# Patient Record
Sex: Male | Born: 1950
Health system: Southern US, Community
[De-identification: ages and names within clinical notes are randomized; demographics above are authoritative.]

## PROBLEM LIST (undated history)

## (undated) DIAGNOSIS — E785 Hyperlipidemia, unspecified: Secondary | ICD-10-CM

## (undated) DIAGNOSIS — K589 Irritable bowel syndrome without diarrhea: Secondary | ICD-10-CM

## (undated) DIAGNOSIS — I10 Essential (primary) hypertension: Secondary | ICD-10-CM

## (undated) DIAGNOSIS — N400 Enlarged prostate without lower urinary tract symptoms: Secondary | ICD-10-CM

## (undated) DIAGNOSIS — E291 Testicular hypofunction: Secondary | ICD-10-CM

## (undated) DIAGNOSIS — K5903 Drug induced constipation: Secondary | ICD-10-CM

## (undated) DIAGNOSIS — M1990 Unspecified osteoarthritis, unspecified site: Secondary | ICD-10-CM

## (undated) DIAGNOSIS — B20 Human immunodeficiency virus [HIV] disease: Secondary | ICD-10-CM

## (undated) DIAGNOSIS — C801 Malignant (primary) neoplasm, unspecified: Secondary | ICD-10-CM

## (undated) HISTORY — DX: Hyperlipidemia, unspecified: E78.5

## (undated) HISTORY — DX: Human immunodeficiency virus (HIV) disease: B20

## (undated) HISTORY — PX: BACK SURGERY: SHX140

## (undated) HISTORY — PX: COLONOSCOPY W/ POLYPECTOMY: SHX1380

## (undated) HISTORY — PX: EYE SURGERY: SHX253

## (undated) HISTORY — DX: Testicular hypofunction: E29.1

---

## 1971-07-18 HISTORY — PX: HEMORRHOID SURGERY: SHX153

## 1982-07-17 HISTORY — PX: OTHER SURGICAL HISTORY: SHX169

## 1983-07-18 DIAGNOSIS — B20 Human immunodeficiency virus [HIV] disease: Secondary | ICD-10-CM

## 1983-07-18 HISTORY — DX: Human immunodeficiency virus (HIV) disease: B20

## 2001-08-12 ENCOUNTER — Ambulatory Visit (HOSPITAL_COMMUNITY): Admission: RE | Admit: 2001-08-12 | Discharge: 2001-08-12 | Payer: Self-pay | Admitting: Internal Medicine

## 2002-06-01 ENCOUNTER — Encounter: Admission: RE | Admit: 2002-06-01 | Discharge: 2002-06-01 | Payer: Self-pay | Admitting: Internal Medicine

## 2002-06-01 ENCOUNTER — Encounter: Payer: Self-pay | Admitting: Internal Medicine

## 2003-08-06 ENCOUNTER — Encounter: Admission: RE | Admit: 2003-08-06 | Discharge: 2003-08-06 | Payer: Self-pay | Admitting: Internal Medicine

## 2006-05-21 ENCOUNTER — Ambulatory Visit: Payer: Self-pay | Admitting: Internal Medicine

## 2006-05-21 LAB — CONVERTED CEMR LAB
AST: 33 units/L (ref 0–37)
Albumin: 4.2 g/dL (ref 3.5–5.2)
BUN: 16 mg/dL (ref 6–23)
Basophils Absolute: 0 10*3/uL (ref 0.0–0.1)
Basophils Relative: 1.1 % — ABNORMAL HIGH (ref 0.0–1.0)
Chloride: 99 meq/L (ref 96–112)
Chol/HDL Ratio, serum: 2.5
Cholesterol: 221 mg/dL (ref 0–200)
Eosinophil percent: 1.3 % (ref 0.0–5.0)
GFR calc non Af Amer: 67 mL/min
Glomerular Filtration Rate, Af Am: 81 mL/min/{1.73_m2}
Glucose, Bld: 96 mg/dL (ref 70–99)
Hemoglobin: 15.9 g/dL (ref 13.0–17.0)
LDL DIRECT: 113.3 mg/dL
Leukocytes, UA: NEGATIVE
MCV: 99.2 fL (ref 78.0–100.0)
Monocytes Absolute: 0.3 10*3/uL (ref 0.2–0.7)
Monocytes Relative: 8.1 % (ref 3.0–11.0)
Neutro Abs: 2 10*3/uL (ref 1.4–7.7)
Neutrophils Relative %: 59.9 % (ref 43.0–77.0)
Nitrite: NEGATIVE
PSA: 0.61 ng/mL (ref 0.10–4.00)
Platelets: 154 10*3/uL (ref 150–400)
RBC: 4.83 M/uL (ref 4.22–5.81)
RDW: 11.6 % (ref 11.5–14.6)
TSH: 2.45 microintl units/mL (ref 0.35–5.50)
Triglyceride fasting, serum: 46 mg/dL (ref 0–149)
VLDL: 9 mg/dL (ref 0–40)
pH: 5.5 (ref 5.0–8.0)

## 2006-05-24 ENCOUNTER — Ambulatory Visit: Payer: Self-pay | Admitting: Internal Medicine

## 2006-08-09 ENCOUNTER — Encounter: Admission: RE | Admit: 2006-08-09 | Discharge: 2006-08-09 | Payer: Self-pay | Admitting: Orthopedic Surgery

## 2006-10-01 ENCOUNTER — Ambulatory Visit: Payer: Self-pay | Admitting: Internal Medicine

## 2006-11-28 ENCOUNTER — Encounter: Admission: RE | Admit: 2006-11-28 | Discharge: 2006-11-28 | Payer: Self-pay | Admitting: Orthopedic Surgery

## 2006-12-21 ENCOUNTER — Encounter: Admission: RE | Admit: 2006-12-21 | Discharge: 2006-12-21 | Payer: Self-pay | Admitting: Orthopedic Surgery

## 2007-01-28 ENCOUNTER — Encounter: Admission: RE | Admit: 2007-01-28 | Discharge: 2007-01-28 | Payer: Self-pay | Admitting: Orthopedic Surgery

## 2007-04-09 ENCOUNTER — Ambulatory Visit: Payer: Self-pay | Admitting: Internal Medicine

## 2007-05-31 ENCOUNTER — Encounter: Admission: RE | Admit: 2007-05-31 | Discharge: 2007-05-31 | Payer: Self-pay | Admitting: Orthopedic Surgery

## 2007-06-06 ENCOUNTER — Encounter: Payer: Self-pay | Admitting: Internal Medicine

## 2007-06-14 ENCOUNTER — Encounter: Admission: RE | Admit: 2007-06-14 | Discharge: 2007-06-14 | Payer: Self-pay | Admitting: Orthopedic Surgery

## 2007-06-20 ENCOUNTER — Encounter: Payer: Self-pay | Admitting: Internal Medicine

## 2007-06-20 DIAGNOSIS — M542 Cervicalgia: Secondary | ICD-10-CM | POA: Insufficient documentation

## 2007-06-20 DIAGNOSIS — B20 Human immunodeficiency virus [HIV] disease: Secondary | ICD-10-CM | POA: Insufficient documentation

## 2007-06-28 ENCOUNTER — Ambulatory Visit: Payer: Self-pay | Admitting: Internal Medicine

## 2007-07-05 ENCOUNTER — Encounter: Admission: RE | Admit: 2007-07-05 | Discharge: 2007-07-05 | Payer: Self-pay | Admitting: Orthopedic Surgery

## 2007-07-18 HISTORY — PX: KNEE ARTHROSCOPY: SUR90

## 2007-08-14 ENCOUNTER — Telehealth: Payer: Self-pay | Admitting: Internal Medicine

## 2007-11-01 ENCOUNTER — Encounter: Admission: RE | Admit: 2007-11-01 | Discharge: 2007-11-01 | Payer: Self-pay | Admitting: Orthopedic Surgery

## 2007-11-12 ENCOUNTER — Encounter: Payer: Self-pay | Admitting: Internal Medicine

## 2007-12-27 ENCOUNTER — Encounter: Admission: RE | Admit: 2007-12-27 | Discharge: 2007-12-27 | Payer: Self-pay | Admitting: Orthopedic Surgery

## 2008-01-10 ENCOUNTER — Telehealth (INDEPENDENT_AMBULATORY_CARE_PROVIDER_SITE_OTHER): Payer: Self-pay | Admitting: *Deleted

## 2008-01-10 ENCOUNTER — Encounter: Admission: RE | Admit: 2008-01-10 | Discharge: 2008-01-10 | Payer: Self-pay | Admitting: Orthopedic Surgery

## 2008-01-29 ENCOUNTER — Encounter: Admission: RE | Admit: 2008-01-29 | Discharge: 2008-01-29 | Payer: Self-pay | Admitting: Orthopedic Surgery

## 2008-02-06 ENCOUNTER — Telehealth: Payer: Self-pay | Admitting: Internal Medicine

## 2008-02-25 ENCOUNTER — Encounter: Payer: Self-pay | Admitting: Internal Medicine

## 2008-03-17 ENCOUNTER — Telehealth: Payer: Self-pay | Admitting: Internal Medicine

## 2008-03-24 ENCOUNTER — Telehealth: Payer: Self-pay | Admitting: Internal Medicine

## 2008-03-25 ENCOUNTER — Encounter: Payer: Self-pay | Admitting: Internal Medicine

## 2008-04-20 ENCOUNTER — Encounter: Payer: Self-pay | Admitting: Internal Medicine

## 2008-05-27 ENCOUNTER — Ambulatory Visit: Payer: Self-pay | Admitting: Internal Medicine

## 2008-06-18 ENCOUNTER — Encounter: Admission: RE | Admit: 2008-06-18 | Discharge: 2008-06-18 | Payer: Self-pay | Admitting: Orthopedic Surgery

## 2008-09-11 ENCOUNTER — Encounter: Admission: RE | Admit: 2008-09-11 | Discharge: 2008-09-11 | Payer: Self-pay | Admitting: Orthopedic Surgery

## 2008-09-22 ENCOUNTER — Encounter: Payer: Self-pay | Admitting: Internal Medicine

## 2008-10-01 ENCOUNTER — Encounter: Admission: RE | Admit: 2008-10-01 | Discharge: 2008-10-01 | Payer: Self-pay | Admitting: Orthopedic Surgery

## 2008-10-21 ENCOUNTER — Encounter: Payer: Self-pay | Admitting: Internal Medicine

## 2008-11-02 ENCOUNTER — Encounter: Payer: Self-pay | Admitting: Internal Medicine

## 2008-11-26 ENCOUNTER — Encounter: Payer: Self-pay | Admitting: Internal Medicine

## 2008-11-30 ENCOUNTER — Encounter: Payer: Self-pay | Admitting: Internal Medicine

## 2008-12-18 ENCOUNTER — Encounter: Payer: Self-pay | Admitting: Internal Medicine

## 2008-12-21 ENCOUNTER — Encounter: Payer: Self-pay | Admitting: Internal Medicine

## 2008-12-22 ENCOUNTER — Telehealth: Payer: Self-pay | Admitting: Internal Medicine

## 2008-12-22 ENCOUNTER — Encounter: Admission: RE | Admit: 2008-12-22 | Discharge: 2008-12-22 | Payer: Self-pay | Admitting: Orthopedic Surgery

## 2008-12-24 ENCOUNTER — Encounter: Payer: Self-pay | Admitting: Internal Medicine

## 2009-01-01 ENCOUNTER — Telehealth: Payer: Self-pay | Admitting: Internal Medicine

## 2009-02-10 ENCOUNTER — Ambulatory Visit: Payer: Self-pay | Admitting: Internal Medicine

## 2009-02-17 ENCOUNTER — Telehealth: Payer: Self-pay | Admitting: Internal Medicine

## 2009-02-19 ENCOUNTER — Telehealth: Payer: Self-pay | Admitting: Internal Medicine

## 2009-03-02 ENCOUNTER — Telehealth: Payer: Self-pay | Admitting: Internal Medicine

## 2009-03-19 ENCOUNTER — Encounter: Admission: RE | Admit: 2009-03-19 | Discharge: 2009-03-19 | Payer: Self-pay | Admitting: Orthopedic Surgery

## 2009-03-23 ENCOUNTER — Telehealth: Payer: Self-pay | Admitting: Internal Medicine

## 2009-04-08 ENCOUNTER — Encounter: Payer: Self-pay | Admitting: Internal Medicine

## 2009-04-09 ENCOUNTER — Telehealth: Payer: Self-pay | Admitting: Internal Medicine

## 2009-04-13 ENCOUNTER — Telehealth: Payer: Self-pay | Admitting: Internal Medicine

## 2009-04-20 ENCOUNTER — Telehealth: Payer: Self-pay | Admitting: Internal Medicine

## 2009-04-20 ENCOUNTER — Encounter: Payer: Self-pay | Admitting: Internal Medicine

## 2009-04-30 ENCOUNTER — Encounter: Admission: RE | Admit: 2009-04-30 | Discharge: 2009-04-30 | Payer: Self-pay | Admitting: Orthopedic Surgery

## 2009-06-25 ENCOUNTER — Encounter: Admission: RE | Admit: 2009-06-25 | Discharge: 2009-06-25 | Payer: Self-pay | Admitting: Orthopedic Surgery

## 2009-07-17 HISTORY — PX: KNEE ARTHROSCOPY: SUR90

## 2009-08-06 ENCOUNTER — Encounter: Admission: RE | Admit: 2009-08-06 | Discharge: 2009-08-06 | Payer: Self-pay | Admitting: Orthopedic Surgery

## 2009-08-30 ENCOUNTER — Telehealth: Payer: Self-pay | Admitting: Internal Medicine

## 2009-09-09 ENCOUNTER — Encounter: Admission: RE | Admit: 2009-09-09 | Discharge: 2009-09-09 | Payer: Self-pay | Admitting: Orthopedic Surgery

## 2009-09-20 ENCOUNTER — Encounter: Payer: Self-pay | Admitting: Internal Medicine

## 2009-09-29 ENCOUNTER — Ambulatory Visit: Payer: Self-pay | Admitting: Internal Medicine

## 2009-12-03 ENCOUNTER — Telehealth: Payer: Self-pay | Admitting: Internal Medicine

## 2009-12-14 ENCOUNTER — Encounter: Admission: RE | Admit: 2009-12-14 | Discharge: 2009-12-14 | Payer: Self-pay | Admitting: Orthopedic Surgery

## 2010-02-04 ENCOUNTER — Ambulatory Visit: Payer: Self-pay | Admitting: Internal Medicine

## 2010-02-07 ENCOUNTER — Encounter: Admission: RE | Admit: 2010-02-07 | Discharge: 2010-02-07 | Payer: Self-pay | Admitting: Orthopedic Surgery

## 2010-02-11 ENCOUNTER — Telehealth: Payer: Self-pay | Admitting: Internal Medicine

## 2010-03-29 ENCOUNTER — Encounter: Admission: RE | Admit: 2010-03-29 | Discharge: 2010-03-29 | Payer: Self-pay | Admitting: Orthopedic Surgery

## 2010-03-29 ENCOUNTER — Telehealth: Payer: Self-pay | Admitting: Internal Medicine

## 2010-07-07 ENCOUNTER — Encounter
Admission: RE | Admit: 2010-07-07 | Discharge: 2010-07-07 | Payer: Self-pay | Source: Home / Self Care | Attending: Orthopedic Surgery | Admitting: Orthopedic Surgery

## 2010-07-08 ENCOUNTER — Telehealth: Payer: Self-pay | Admitting: Internal Medicine

## 2010-07-25 ENCOUNTER — Telehealth: Payer: Self-pay | Admitting: Internal Medicine

## 2010-07-28 ENCOUNTER — Telehealth: Payer: Self-pay | Admitting: Internal Medicine

## 2010-07-29 ENCOUNTER — Encounter: Payer: Self-pay | Admitting: Internal Medicine

## 2010-08-10 ENCOUNTER — Telehealth: Payer: Self-pay | Admitting: Internal Medicine

## 2010-08-16 ENCOUNTER — Encounter
Admission: RE | Admit: 2010-08-16 | Discharge: 2010-08-16 | Payer: Self-pay | Source: Home / Self Care | Attending: Orthopedic Surgery | Admitting: Orthopedic Surgery

## 2010-08-16 NOTE — Progress Notes (Signed)
Summary: REFILL  Phone Note Refill Request   Refills Requested: Medication #1:  TESTOSTERONE ENANTHATE 200 MG/ML  OIL INJECT 200MG  ( ) EVERY 2 WEEKS Pt did not give name of pharm, I believe he needs this mail order.   Initial call taken by: Lamar Sprinkles, CMA,  Dec 03, 2009 4:51 PM  Follow-up for Phone Call        ok for refill as needed  Follow-up by: Jacques Navy MD,  Dec 03, 2009 6:52 PM    Prescriptions: TESTOSTERONE ENANTHATE 200 MG/ML  OIL (TESTOSTERONE ENANTHATE) INJECT 200MG  ( ) EVERY 2 WEEKS  #3 mth x 1   Entered by:   Lamar Sprinkles, CMA   Authorized by:   Jacques Navy MD   Signed by:   Lamar Sprinkles, CMA on 12/06/2009   Method used:   Printed then faxed to ...       [facility]       [address]       [place], [place]  [address]       Ph: [phone]       Fax: [phone]   RxID:   [id number]   Appended Document: REFILL Left vm for pt that rx was faxed in last week  Appended Document: REFILL Pt called, Pharm did not recieved rx, [facility] to pharmacist. Left vm for pt

## 2010-08-16 NOTE — Assessment & Plan Note (Signed)
Summary: INDIGESTION--X 3 WKS/STARTED--3/2---STC   Vital Signs:  Patient profile:   60 year old male Height:      68 inches Weight:      147 pounds BMI:     22.43 O2 Sat:      97 % on Room air Temp:     96.6 degrees F oral Pulse rate:   65 / minute BP sitting:   138 / 98  (left arm) Cuff size:   regular  Vitals Entered By: Bill Salinas CMA (September 29, 2009 11:27 AM)  O2 Flow:  Room air CC: pt here with complaint of severe indigestion x 3 weeks/ ab   Primary Care Provider:  Kathyann Spaugh  CC:  pt here with complaint of severe indigestion x 3 weeks/ ab.  History of Present Illness: Presents for a 3 weeks h/o left upper abdominal pain, moderate to severe. He has tried gaviscon which helped. No hematochezia or melena. He has been feeling weak. He has cut out all supplements.   Current Medications (verified): 1)  Testosterone Enanthate 200 Mg/ml  Oil (Testosterone Enanthate) .... Inject 200mg  (1ml) Every 2 Weeks 2)  Potassium 99 Mg Tabs (Potassium) .... Take 1 Tablet By Mouth Once A Day 3)  Garlic 500 Mg Tabs (Garlic) .... Take 1 Tablet By Mouth Once A Day 4)  Adult Aspirin Low Strength 81 Mg  Tbdp (Aspirin) .... Take 1 Tablet By Mouth Once A Day 5)  Multivitamins   Tabs (Multiple Vitamin) .... Take 1 Tablet By Mouth Once A Day 6)  Saw Palmetto 450 Mg  Caps (Saw Palmetto (Serenoa Repens)) .... Take 1 Tablet By Mouth Once A Day 7)  Caverject 20 Mcg Solr (Alprostadil (Vasodilator)) .... Inject Subcutaneously Once Daily As Needed 8)  Bd Luer-Lok Syringe 23g X 1-1/2" 3 Ml Misc (Syringe/needle (Disp)) .... Use 1 Q 2 Wks As Directed With Testosterone 9)  Etodolac 500 Mg Tabs (Etodolac) .... Take 1 Tablet By Mouth Once A Day 10)  Naproxen 500 Mg Tabs (Naproxen) .... Take 1 Tablet By Mouth Once A Day 11)  Hydrocodone-Acetaminophen 7.5-750 Mg Tabs (Hydrocodone-Acetaminophen) .... Take 2-3 Tabs Daily 12)  Glucosamine-Chondroitin-Msm 500-400-250 Mg Tabs (Glucosamine-Chondroitin-Msm) .... Take 1 Tablet  By Mouth Once A Day 13)  Fish Oil 1000 Mg Caps (Omega-3 Fatty Acids) .... Take 1 Tablet By Mouth Once A Day 14)  Horny Goat Weed  Caps (Misc [employer]) .... Take 1 Tablet By Mouth Once A Day 15)  Zinc 50 Mg Tabs (Zinc) .... Take 1 Tablet By Mouth Once A Day 16)  Dhea 25 Mg Caps (Prasterone (Dhea)) .... Take 1 Tablet By Mouth Once A Day 17)  Melatonin 5 Mg Tabs (Melatonin) .... As Needed 18)  Amitriptyline Hcl 25 Mg Tabs (Amitriptyline Hcl) .... As Directed As Needed 19)  Cyclobenzaprine Hcl 5 Mg Tabs (Cyclobenzaprine Hcl) .... As Directed As Needed 20)  Atripla 600-200-300 Mg Tabs (Efavirenz-Emtricitab-Tenofovir) .[name].. 1 By Mouth Once Daily 21)  Bacterio-Static Water .... Use With Caverject 22)  Cialis 5 Mg Tabs (Tadalafil) .[name].. 1 By Mouth Once Daily 23)  Hydrochlorothiazide 50 Mg Tabs (Hydrochlorothiazide) .[name].. 1 Tab Qd  Allergies (verified): No Known Drug Allergies  Past History:  Past Medical History: Last updated: 05/27/2008 HIV disease - last CD4 in July 2009 was 224  Past Surgical History: Last updated: 06/20/2007 neck surgery Hemorrhoidectomy  Review of Systems       The patient complains of severe indigestion/heartburn.  The patient denies anorexia, fever, weight loss, decreased hearing,  chest pain, syncope, dyspnea on exertion, prolonged cough, headaches, hemoptysis, abdominal pain, melena, hematochezia, muscle weakness, transient blindness, abnormal bleeding, and angioedema.    Physical Exam  General:  Well-developed,well-nourished,in no acute distress; alert,appropriate and cooperative throughout examination Lungs:  normal respiratory effort.   Heart:  normal rate and regular rhythm.   Abdomen:  tender to palpation epigastrum and LUQ. No guasrding or rebound   Impression & Recommendations:  Problem # 1:  DYSPEPSIA (ICD-536.8) Dyspepsia without evidence of bleed.  Plan - nexium 40mg  once daily x 15 (samples) then           Rantidine 150mg  two times a day x  4 weeks           Call for unrelieved pain or bleeding.  Complete Medication List: 1)  Testosterone Enanthate 200 Mg/ml Oil (Testosterone enanthate) .... Inject 200mg  (1ml) every 2 weeks 2)  Potassium 99 Mg Tabs (Potassium) .... Take 1 tablet by mouth once a day 3)  Garlic 500 Mg Tabs (Garlic) .... Take 1 tablet by mouth once a day 4)  Adult Aspirin Low Strength 81 Mg Tbdp (Aspirin) .... Take 1 tablet by mouth once a day 5)  Multivitamins Tabs (Multiple vitamin) .... Take 1 tablet by mouth once a day 6)  Saw Palmetto 450 Mg Caps (Saw palmetto (serenoa repens)) .... Take 1 tablet by mouth once a day 7)  Caverject 20 Mcg Solr (Alprostadil (vasodilator)) .... Inject subcutaneously once daily as needed 8)  Bd Luer-lok Syringe 23g X 1-1/2" 3 Ml Misc (Syringe/needle (disp)) .... Use 1 q 2 wks as directed with testosterone 9)  Etodolac 500 Mg Tabs (Etodolac) .... Take 1 tablet by mouth once a day 10)  Naproxen 500 Mg Tabs (Naproxen) .... Take 1 tablet by mouth once a day 11)  Hydrocodone-acetaminophen 7.5-750 Mg Tabs (Hydrocodone-acetaminophen) .... Take 2-3 tabs daily 12)  Glucosamine-chondroitin-msm 500-400-250 Mg Tabs (Glucosamine-chondroitin-msm) .... Take 1 tablet by mouth once a day 13)  Fish Oil 1000 Mg Caps (Omega-3 fatty acids) .... Take 1 tablet by mouth once a day 14)  Horny Goat Weed Caps (Misc natural products) .... Take 1 tablet by mouth once a day 15)  Zinc 50 Mg Tabs (Zinc) .... Take 1 tablet by mouth once a day 16)  Dhea 25 Mg Caps (Prasterone (dhea)) .... Take 1 tablet by mouth once a day 17)  Melatonin 5 Mg Tabs (Melatonin) .... As needed 18)  Amitriptyline Hcl 25 Mg Tabs (Amitriptyline hcl) .... As directed as needed 19)  Cyclobenzaprine Hcl 5 Mg Tabs (Cyclobenzaprine hcl) .... As directed as needed 20)  Atripla 600-200-300 Mg Tabs (Efavirenz-emtricitab-tenofovir) .[name].. 1 by mouth once daily 21)  Bacterio-static Water  .... Use with caverject 22)  Cialis 5 Mg Tabs (Tadalafil)  .[name].. 1 by mouth once daily 23)  Hydrochlorothiazide 50 Mg Tabs (Hydrochlorothiazide) .[name].. 1 tab qd  Patient Instructions: 1)  Stomach pain - most likely gastritis (increased acid problem). Plan - take Nexium 40mg  once a day for 15 days (samples) then switch to over the counter Ranitidine 150mg  two times a day for 1 month. OK to take gaviscon.

## 2010-08-16 NOTE — Progress Notes (Signed)
Summary: Cialis  Phone Note Call from Patient   Summary of Call: Patient is requesting refill of cialis 5 mg #30. OK?  Initial call taken by: Lamar Sprinkles, CMA,  August 30, 2009 5:09 PM  Follow-up for Phone Call        OK for refill on cialis  5mg , sig 1 po qd, # 30, refill as needed . Have added to med list and eScribed. Follow-up by: Jacques Navy MD,  August 30, 2009 6:16 PM    New/Updated Medications: CIALIS 5 MG TABS (TADALAFIL) 1 by mouth once daily Prescriptions: CIALIS 5 MG TABS (TADALAFIL) 1 by mouth once daily  #30 x 12   Entered and Authorized by:   Jacques Navy MD   Signed by:   Jacques Navy MD on 08/30/2009   Method used:   [facility]  [id number]       [address]       [place], [place]  [address]       Ph: [phone] or [phone]       Fax: [phone]   RxID:   [id number]

## 2010-08-16 NOTE — Medication Information (Signed)
Summary: [facility]  [facility]   Imported By: Esmeralda Links D'jimraou 02/27/2008 15:21:12  _____________________________________________________________________  External Attachment:    Type:   Image     Comment:   External Document

## 2010-08-16 NOTE — Progress Notes (Signed)
Summary: REFILL?   Phone Note From Pharmacy   [facility] [phone] REF # [phone]   Summary of Call: Pharm needs a call back regarding pt, use reference # above.  Initial call taken by: Lamar Sprinkles, CMA,  March 29, 2010 12:15 PM  Follow-up for Phone Call        [facility] caver[facility]. OK for refill?   Need to call pt when complete - [phone] Follow-up by: Lamar Sprinkles, CMA,  March 30, 2010 7:14 PM  Additional Follow-up for Phone Call Additional follow up Details #1::        OK for refill as needed  Additional Follow-up by: Jacques Navy MD,  March 31, 2010 12:40 PM    Prescriptions: CAVERJECT 20 MCG SOLR (ALPROSTADIL (VASODILATOR)) inject subcutaneously once daily as needed  #18 x 5   Entered by:   Ami Bullins CMA   Authorized by:   Jacques Navy MD   Signed by:   Bill Salinas CMA on 03/31/2010   Method used:   Faxed to ...       [facility]       [address] [place], [place]  [address]       Ph: [phone]       Fax: [phone]   RxID:   [id number]

## 2010-08-16 NOTE — Progress Notes (Signed)
Summary: REFILL  Phone Note Refill Request   Refills Requested: Medication #1:  TESTOSTERONE ENANTHATE 200 MG/ML  OIL INJECT 200MG  ( ) EVERY 2 WEEKS [facility]?   Initial call taken by: Lamar Sprinkles, CMA,  February 11, 2010 4:38 PM  Follow-up for Phone Call        OK for refill prn Follow-up by: Jacques Navy MD,  February 13, 2010 1:53 PM  Additional Follow-up for Phone Call Additional follow up Details #1::        [facility] Additional Follow-up by: Ami Bullins CMA,  February 14, 2010 1:39 PM    Prescriptions: TESTOSTERONE ENANTHATE 200 MG/ML  OIL (TESTOSTERONE ENANTHATE) INJECT 200MG  ( ) EVERY 2 WEEKS  #3 mth x 1   Entered by:   Ami Bullins CMA   Authorized by:   Jacques Navy MD   Signed by:   Bill Salinas CMA on 02/14/2010   Method used:   Printed then faxed to ...       [facility]       [address]       [place], [place]  [address]       Ph: [phone]       Fax: [phone]   RxID:   [id number] TESTOSTERONE ENANTHATE 200 MG/ML  OIL (TESTOSTERONE ENANTHATE) INJECT 200MG  ( ) EVERY 2 WEEKS  #3 mth x 1   Entered and Authorized by:   Ami Bullins CMA   Signed by:   Ami Bullins CMA on 02/14/2010   Method used:   Printed then faxed to ...       [facility]  [address] [id number]       [address]       [place], [place]  [address]       Ph: [phone] or [phone]       Fax: [phone]   RxID:   [id number]

## 2010-08-16 NOTE — Progress Notes (Signed)
Summary: CORRECTION  Phone Note Call from Patient   Summary of Call: Patient is requesting update to note from last office visit. Dr at [facility] is spelled [facility].  Initial call taken by: Lamar Sprinkles, CMA,  February 11, 2010 4:39 PM  Follow-up for Phone Call        got it Follow-up by: Jacques Navy MD,  February 13, 2010 1:54 PM

## 2010-08-16 NOTE — Assessment & Plan Note (Signed)
Summary: CP[facility] / [facility]  #  SENT LABS HERE FROM [facility]   Vital Signs:  Patient profile:   60 year old male Height:      68 inches Weight:      154 pounds BMI:     23.50 O2 Sat:      98 % on Room air Temp:     97.2 degrees F oral Pulse rate:   82 / minute BP sitting:   124 / 80  (left arm) Cuff size:   regular  Vitals Entered By: Bill Salinas CMA (February 04, 2010 1:12 PM)  O2 Flow:  Room air CC: cpx/ab   Primary Care Provider:  Marirose Deveney  CC:  cpx/ab.  History of Present Illness: Patient presents for routine medical follow-up. He has been doing well. He had labs done in March '11. Labs from that visit reviewed: normal CBC, Cmet and HIV undetected. He has had no intercurrent illness.  Current Medications (verified): 1)  Testosterone Enanthate 200 Mg/ml  Oil (Testosterone Enanthate) .... Inject 200mg  (1ml) Every 2 Weeks 2)  Potassium 99 Mg Tabs (Potassium) .... Take 1 Tablet By Mouth Once A Day 3)  Garlic 500 Mg Tabs (Garlic) .... Take 1 Tablet By Mouth Once A Day 4)  Adult Aspirin Low Strength 81 Mg  Tbdp (Aspirin) .... Take 1 Tablet By Mouth Once A Day 5)  Multivitamins   Tabs (Multiple Vitamin) .... Take 1 Tablet By Mouth Once A Day 6)  Saw Palmetto 450 Mg  Caps (Saw Palmetto (Serenoa Repens)) .... Take 1 Tablet By Mouth Once A Day 7)  Caverject 20 Mcg Solr (Alprostadil (Vasodilator)) .... Inject Subcutaneously Once Daily As Needed 8)  Bd Luer-Lok Syringe 23g X 1-1/2" 3 Ml Misc (Syringe/needle (Disp)) .... Use 1 Q 2 Wks As Directed With Testosterone 9)  Etodolac 500 Mg Tabs (Etodolac) .... Take 1 Tablet By Mouth Once A Day 10)  Naproxen 500 Mg Tabs (Naproxen) .... Take 1 Tablet By Mouth Once A Day 11)  Hydrocodone-Acetaminophen 7.5-750 Mg Tabs (Hydrocodone-Acetaminophen) .... Take 2-3 Tabs Daily 12)  Glucosamine-Chondroitin-Msm 500-400-250 Mg Tabs (Glucosamine-Chondroitin-Msm) .... Take 1 Tablet By Mouth Once A Day 13)  Fish Oil 1000 Mg Caps (Omega-3 Fatty Acids) .... Take 1 Tablet By  Mouth Once A Day 14)  Horny Goat Weed  Caps (Misc [employer]) .... Take 1 Tablet By Mouth Once A Day 15)  Zinc 50 Mg Tabs (Zinc) .... Take 1 Tablet By Mouth Once A Day 16)  Dhea 25 Mg Caps (Prasterone (Dhea)) .... Take 1 Tablet By Mouth Once A Day 17)  Melatonin 5 Mg Tabs (Melatonin) .... As Needed 18)  Amitriptyline Hcl 25 Mg Tabs (Amitriptyline Hcl) .... As Directed As Needed 19)  Cyclobenzaprine Hcl 5 Mg Tabs (Cyclobenzaprine Hcl) .... As Directed As Needed 20)  Atripla 600-200-300 Mg Tabs (Efavirenz-Emtricitab-Tenofovir) .[name].. 1 By Mouth Once Daily 21)  Bacterio-Static Water .... Use With Caverject 22)  Cialis 5 Mg Tabs (Tadalafil) .[name].. 1 By Mouth Once Daily 23)  Hydrochlorothiazide 50 Mg Tabs (Hydrochlorothiazide) .[name].. 1 Tab Qd  Allergies (verified): No Known Drug Allergies  Past History:  Past Medical History: Last updated: 05/27/2008 HIV disease - last CD4 in July 2009 was 224  Past Surgical History: Last updated: 06/20/2007 neck surgery Hemorrhoidectomy  Family History: Father - deceased @ 59: CAD/MI, CVA's, HTN, Lipids Mother - 1924: healthy Neg-colon or prostate cancer; DM  Social History: [employer] sexually active, monogamous with single male partner 1981 retired- 2009 from [employer]  work.   Review of Systems  The patient denies anorexia, fever, weight loss, weight gain, vision loss, decreased hearing, chest pain, dyspnea on exertion, peripheral edema, prolonged cough, abdominal pain, severe indigestion/heartburn, muscle weakness, transient blindness, depression, unusual weight change, enlarged lymph nodes, and angioedema.         chronic back pain - OA: gets ESI q 2 months at [facility]. Had DXA at [facility] - nl.   Physical Exam  General:  WNWD white male, very tanned, in no distress Head:  Normocephalic and atraumatic without obvious abnormalities. Eyes:  No corneal or conjunctival inflammation noted. EOMI. Perrla. Funduscopic exam  benign, without hemorrhages, exudates or papilledema. Vision grossly normal. Ears:  External ear exam shows no significant lesions or deformities.  Otoscopic examination reveals clear canals, tympanic membranes are intact bilaterally without bulging, retraction, inflammation or discharge. Hearing is grossly normal bilaterally. Nose:  no external deformity and no external erythema.   Mouth:  Oral mucosa and oropharynx without lesions or exudates.  Teeth in good repair. Neck:  supple, no masses, no thyromegaly, no JVD, and no carotid bruits.   Chest Wall:  No deformities, masses, tenderness or gynecomastia noted. Breasts:  nipple rings Lungs:  Normal respiratory effort, chest expands symmetrically. Lungs are clear to auscultation, no crackles or wheezes. Heart:  Normal rate and regular rhythm. S1 and S2 normal without gallop, murmur, click, rub or other extra sounds. Abdomen:  Bowel sounds positive,abdomen soft and non-tender without masses, organomegaly or hernias noted. Msk:  normal ROM, no joint tenderness, no joint swelling, no joint warmth, no redness over joints, and no joint deformities.   Pulses:  2+ radial, PT and DP pulses Extremities:  No clubbing, cyanosis, edema, or deformity noted with normal full range of motion of all joints.   Neurologic:  alert & oriented X3, cranial nerves II-XII intact, strength normal in all extremities, sensation intact to light touch, gait normal, and DTRs symmetrical and normal.   Skin:  Too tanned. No suspicious lesions, no ulcerations. Cat scratch on left shin Cervical Nodes:  No lymphadenopathy noted Axillary Nodes:  No palpable lymphadenopathy Inguinal Nodes:  No significant adenopathy Psych:  Oriented X3, memory intact for recent and remote, normally interactive, good eye contact, and not anxious appearing.     Impression & Recommendations:  Problem # 1:  CERVICAL DISC DISORDER (ICD-722.91) Patient sees Dr. Charlett Blake and does get regular ESI. He is not  limited in his activities  Problem # 2:  HIV DISEASE (ICD-042) Followed by Dr. Daleen Squibb in ID/HIV clinic at [facility]. Stable with undetectable viral load.  Problem # 3:  Preventive Health Care (ICD-V70.0) No problems. Normal physical exam. He is current with immunizations except Zostavax and he will inquire at [facility] as to whether he should have this. He is current with colorectal cancer screening. He is current with his dermatologist.   In summary - a very nice gentleman who appears to be medically stable. He will get a lipid panel at his next lab draw at ID clinic.   Complete Medication List: 1)  Testosterone Enanthate 200 Mg/ml Oil (Testosterone enanthate) .... Inject 200mg  (1ml) every 2 weeks 2)  Potassium 99 Mg Tabs (Potassium) .... Take 1 tablet by mouth once a day 3)  Garlic 500 Mg Tabs (Garlic) .... Take 1 tablet by mouth once a day 4)  Adult Aspirin Low Strength 81 Mg Tbdp (Aspirin) .... Take 1 tablet by mouth once a day 5)  Multivitamins Tabs (  Multiple vitamin) .... Take 1 tablet by mouth once a day 6)  Saw Palmetto 450 Mg Caps (Saw palmetto (serenoa repens)) .... Take 1 tablet by mouth once a day 7)  Caverject 20 Mcg Solr (Alprostadil (vasodilator)) .... Inject subcutaneously once daily as needed 8)  Bd Luer-lok Syringe 23g X 1-1/2" 3 Ml Misc (Syringe/needle (disp)) .... Use 1 q 2 wks as directed with testosterone 9)  Etodolac 500 Mg Tabs (Etodolac) .... Take 1 tablet by mouth once a day 10)  Naproxen 500 Mg Tabs (Naproxen) .... Take 1 tablet by mouth once a day 11)  Hydrocodone-acetaminophen 7.5-750 Mg Tabs (Hydrocodone-acetaminophen) .... Take 2-3 tabs daily 12)  Glucosamine-chondroitin-msm 500-400-250 Mg Tabs (Glucosamine-chondroitin-msm) .... Take 1 tablet by mouth once a day 13)  Fish Oil 1000 Mg Caps (Omega-3 fatty acids) .... Take 1 tablet by mouth once a day 14)  Horny Goat Weed Caps (Misc natural products) .... Take 1 tablet by mouth once a day 15)  Zinc 50 Mg Tabs  (Zinc) .... Take 1 tablet by mouth once a day 16)  Dhea 25 Mg Caps (Prasterone (dhea)) .... Take 1 tablet by mouth once a day 17)  Melatonin 5 Mg Tabs (Melatonin) .... As needed 18)  Amitriptyline Hcl 25 Mg Tabs (Amitriptyline hcl) .... As directed as needed 19)  Cyclobenzaprine Hcl 5 Mg Tabs (Cyclobenzaprine hcl) .... As directed as needed 20)  Atripla 600-200-300 Mg Tabs (Efavirenz-emtricitab-tenofovir) .[name].. 1 by mouth once daily 21)  Bacterio-static Water  .... Use with caverject 22)  Cialis 5 Mg Tabs (Tadalafil) .[name].. 1 by mouth once daily 23)  Hydrochlorothiazide 50 Mg Tabs (Hydrochlorothiazide) .[name].. 1 tab qd

## 2010-08-18 NOTE — Progress Notes (Signed)
Summary: RF - mail order   Phone Note Call from Patient Call back at [phone]   Reason for Call: Lab or Test Results Summary of Call: Patient is requesting rx for 3 mth supply to [facility] for caverject. What qty is ok for 3 mth supply?  Initial call taken by: Lamar Sprinkles, CMA,  July 08, 2010 9:41 AM  Follow-up for Phone Call        same as previously renewed: 200mg /ml, 1 ml IM every 2 weeks. Single dose vials = 6 vials.  Follow-up by: Jacques Navy MD,  July 08, 2010 11:47 AM  Additional Follow-up for Phone Call Additional follow up Details #1::        Left vm for pt Additional Follow-up by: Lamar Sprinkles, CMA,  July 08, 2010 12:23 PM    Prescriptions: BACTERIO-STATIC WATER Use with Caverject  #3 mth supply x 3   Entered by:   Lamar Sprinkles, CMA   Authorized by:   Jacques Navy MD   Signed by:   Lamar Sprinkles, CMA on 07/08/2010   Method used:   Faxed to ...       [facility] (mail-order)             , [place]         Ph: [phone]       Fax: [phone]   RxID:   [id number] CAVERJECT 20 MCG SOLR (ALPROSTADIL (VASODILATOR)) inject subcutaneously once daily as needed  #6 vials x 3   Entered by:   Lamar Sprinkles, CMA   Authorized by:   Jacques Navy MD   Signed by:   Lamar Sprinkles, CMA on 07/08/2010   Method used:   Faxed to ...       [facility] (mail-order)             , [place]         Ph: [phone]       Fax: [phone]   RxID:   [id number]

## 2010-08-18 NOTE — Progress Notes (Signed)
  Phone Note Refill Request Message from:  Fax from Pharmacy  Refills Requested: Medication #1:  CAVERJECT 20 MCG SOLR inject subcutaneously once daily as needed Initial call taken by: Ami Bullins CMA,  July 25, 2010 11:06 AM    Prescriptions: CAVERJECT 20 MCG SOLR (ALPROSTADIL (VASODILATOR)) inject subcutaneously once daily as needed  #39 x 3   Entered by:   Ami Bullins CMA   Authorized by:   Jacques Navy MD   Signed by:   Bill Salinas CMA on 07/25/2010   Method used:   Faxed to ...       [facility] (retail)             ,          Ph: [phone]       Fax: [phone]   RxID:   [id number]

## 2010-08-18 NOTE — Medication Information (Signed)
Summary: Edex Cartridge 2-pk kit/[facility]  Edex Cartridge 2-pk kit/[facility]   Imported By: Sherian Rein 08/03/2010 08:27:57  _____________________________________________________________________  External Attachment:    Type:   Image     Comment:   External Document

## 2010-08-18 NOTE — Progress Notes (Signed)
Summary: CALL [facility]  Phone Note From Pharmacy Call back at [phone]    Summary of Call: Per pt, [facility] needs a call from our office. Call [phone] opt [phone]  Initial call taken by: Lamar Sprinkles, CMA,  July 28, 2010 9:09 AM  Follow-up for Phone Call        rec fax from [facility] w/question, waiting on MD to complete form for alt med to caverject Follow-up by: Lamar Sprinkles, CMA,  July 28, 2010 10:27 AM  Additional Follow-up for Phone Call Additional follow up Details #1::        Pt informed that info is pending MD's decision Additional Follow-up by: Lamar Sprinkles, CMA,  July 28, 2010 5:54 PM    Additional Follow-up for Phone Call Additional follow up Details #2::    Form completed & faxed by MD today Follow-up by: Lamar Sprinkles, CMA,  July 29, 2010 5:34 PM

## 2010-08-18 NOTE — Progress Notes (Signed)
Summary: RF - TESTOSTERONE  Phone Note Refill Request   Refills Requested: Medication #1:  TESTOSTERONE ENANTHATE 200 MG/ML  OIL INJECT 200MG  ( ) EVERY 2 WEEKS [facility] -  [phone] REF # [id number]  OK FOR RF?   Initial call taken by: Lamar Sprinkles, CMA,  August 10, 2010 3:16 PM  Follow-up for Phone Call        OK for refills Follow-up by: Jacques Navy MD,  August 10, 2010 5:45 PM    Prescriptions: TESTOSTERONE ENANTHATE 200 MG/ML  OIL (TESTOSTERONE ENANTHATE) INJECT 200MG  ( ) EVERY 2 WEEKS  #3 mth x 1   Entered by:   Ami Bullins CMA   Authorized by:   Jacques Navy MD   Signed by:   Bill Salinas CMA on 08/11/2010   Method used:   Telephoned to ...       [facility]       [address]       [place], [place]  [address]       Ph: [phone]       Fax: [phone]   RxID:   [id number]

## 2010-08-23 ENCOUNTER — Telehealth: Payer: Self-pay | Admitting: Internal Medicine

## 2010-08-24 ENCOUNTER — Telehealth: Payer: Self-pay | Admitting: Internal Medicine

## 2010-09-01 NOTE — Progress Notes (Signed)
Summary: SYRINGE RX  Phone Note From Pharmacy   Caller: [facility] Summary of Call: Pharm called - pt needs refill of 29 guage insulin syringe 1cc.  Initial call taken by: Lamar Sprinkles, CMA,  August 23, 2010 12:25 PM    Prescriptions: BD LUER-LOK SYRINGE 23G X 1-1/2" 3 ML MISC (SYRINGE/NEEDLE (DISP)) Use 1 q 2 wks as directed with testosterone  #3 mth x 3   Entered by:   Lamar Sprinkles, CMA   Authorized by:   Jacques Navy MD   Signed by:   Lamar Sprinkles, CMA on 08/23/2010   Method used:   Electronically to        [facility]       [address]       [place], [place]  [address]       Ph: [phone]       Fax: [phone]   RxID:   [id number]

## 2010-09-01 NOTE — Progress Notes (Signed)
Summary: RX NEEDED  Phone Note Call from Patient   Summary of Call: Wants rx for Syringe/needle 29guage 1/2inch 1 cc syringe [facility]  Initial call taken by: Lamar Sprinkles, CMA,  August 24, 2010 3:35 PM    New/Updated Medications: BD SAFETY-LOK INSULIN SYRINGE 29G X 1/2" 1 ML MISC (INSULIN SYRINGE-NEEDLE U-100) as directed every 2 weeks w/testosterone Prescriptions: BD SAFETY-LOK INSULIN SYRINGE 29G X 1/2" 1 ML MISC (INSULIN SYRINGE-NEEDLE U-100) as directed every 2 weeks w/testosterone  #6 x 3   Entered by:   Lamar Sprinkles, CMA   Authorized by:   Jacques Navy MD   Signed by:   Lamar Sprinkles, CMA on 08/24/2010   Method used:   Electronically to        [facility]       [address]       [place], [place]  [address]       Ph: [phone]       Fax: [phone]   RxID:   [id number]

## 2010-10-14 ENCOUNTER — Other Ambulatory Visit: Payer: Self-pay | Admitting: Orthopedic Surgery

## 2010-10-14 DIAGNOSIS — M549 Dorsalgia, unspecified: Secondary | ICD-10-CM

## 2010-10-14 DIAGNOSIS — IMO0002 Reserved for concepts with insufficient information to code with codable children: Secondary | ICD-10-CM

## 2010-10-18 ENCOUNTER — Ambulatory Visit
Admission: RE | Admit: 2010-10-18 | Discharge: 2010-10-18 | Disposition: A | Discharge status: Home / Self Care | Payer: 59 | Source: Ambulatory Visit | Attending: Orthopedic Surgery | Admitting: Orthopedic Surgery

## 2010-10-18 DIAGNOSIS — M549 Dorsalgia, unspecified: Secondary | ICD-10-CM

## 2010-10-18 DIAGNOSIS — IMO0002 Reserved for concepts with insufficient information to code with codable children: Secondary | ICD-10-CM

## 2010-10-25 ENCOUNTER — Telehealth: Payer: Self-pay | Admitting: *Deleted

## 2010-10-25 NOTE — Telephone Encounter (Signed)
Fax from [facility] for Testosterone 200mg /ML Inject 200mg  ( ) every 2 weeks. Please Advise refill

## 2010-10-26 MED ORDER — TESTOSTERONE CYPIONATE 200 MG/ML IM SOLN: 200.0000 mg | INTRAMUSCULAR | Status: DC

## 2010-10-26 NOTE — Telephone Encounter (Signed)
Ok for refill  x3 

## 2010-11-08 ENCOUNTER — Telehealth: Payer: Self-pay | Admitting: *Deleted

## 2010-11-08 NOTE — Telephone Encounter (Signed)
Pt left vm - he is upset that [facility]. [facility] Pt would like a call back when complete.

## 2010-11-09 MED ORDER — TESTOSTERONE CYPIONATE 200 MG/ML IM SOLN: 200.0000 mg | INTRAMUSCULAR | Status: DC

## 2010-11-09 NOTE — Telephone Encounter (Signed)
Rx was called in by [facility] and pt was informed

## 2010-12-02 NOTE — Assessment & Plan Note (Signed)
[facility]                             PRIMARY CARE OFFICE NOTE   NAME:Emberton, Troy CARBONELL                    MRN:          [id number]  DATE:05/24/2006                            DOB:          Jul 04, 1951    Troy Marsh is a 60 year old Caucasian male, who presents for annual  followup evaluation and exam.  He was last seen in the office on June 26, 2005, for swelling of the right ankle, sinus congestion and low back  pain, and these problems have resolved.   INTERVAL HISTORY:  The patient reports he i feeling well and doing well.  He  continues to see Dr. Smith Mince at the ID Clinic, [facility], [place].  He  reports that his last CD-4 count was 450, viral load remains undetectable.   PAST MEDICAL HISTORY:  He has had the usual childhood diseases.  He has HIV  disease.  He has had cervical disc disease.  He has had internal  hemorrhoids.   PAST SURGICAL HISTORY:  1. Hemorrhoidectomy.  2. Neck surgery in the past.   CURRENT MEDICATIONS:  1. Norvir daily.  2. Truvada daily.  3. Reyataz daily.  4. Viramune daily.  5. SMZ-TMP daily.  6. Depo-testosterone injections every 2 weeks.   HABITS:  The patient is now smoke-free times nine months.  He is abstentious  from alcohol.   FAMILY HISTORY:  Significant for colon cancer in the maternal kinship.   CHART REVIEW:  The patient's last colonoscopy was August 12, 2001, with  internal hemorrhoids, otherwise unremarkable.  The patient has had lower  extremity Doppler studies, July 16, 1995.  The patient had abdominal  ultrasound, July of 2005, with thickened gallbladder wall, but nonspecific  otherwise, with no significant cholecystitis or cholelithiasis.  Last EKG  from September 07, 2003, was unremarkable with sinus rhythm.   REVIEW OF SYSTEMS:  The patient has had no constitutional, cardiovascular,  respiratory, GI or GU problems.   INTERVAL SOCIAL HISTORY:  The patient continues to  work for [employer].  He  had a great vacation to [place].  He and his partner married in [place]  during the last year and he is very pleased with this.   PHYSICAL EXAMINATION:  VITAL SIGNS:  Temperature was 96.8, blood pressure  124/79, pulse 68, weight 143.  GENERAL APPEARANCE:  This is a slender Caucasian male with a deep tan, in no  acute distress.  HEENT EXAM:  Normocephalic, atraumatic.  EACs and TMs were unremarkable.  Oropharynx with native dentition, in good repair.  No buccal or palatal  lesions were noted.  Posterior pharynx was clear.  Conjunctivae and sclerae  were clear.  The patient does have an ear-ring on the right.  NECK:  The neck was supple, without thyromegaly.  NODES:  No adenopathy was noted in the cervical, supraclavicular, or  axillary regions.  CHEST:  No CVA tenderness.  The patient does have nipple rings bilaterally.  LUNGS:  Clear with no rales, wheezes or rhonchi.  CARDIOVASCULAR:  There are 2+ radial pulses, no JVD or carotid bruits.  He  had a quiet precordium with regular rate and rhythm, without murmurs, rubs  or gallops.  ABDOMEN:  Scaphoid with no organosplenomegaly, no guarding or rebound, no  tenderness.  GENITALIA AND RECTAL EXAMS:  Deferred to exams being performed at [facility].  EXTREMITIES:  Without clubbing, cyanosis, edema or deformity.  NEUROLOGIC EXAM:  Nonfocal.  SKIN:  Deeply tanned, but no obvious lesions were noted.   LABORATORY DATA:  From May 21, 2006, with hemoglobin of 15.9 g, white  count was 3200 with 59.9% segs, 29.6% lymphs, 8.1% monos.  Cholesterol is  221, triglyceride was 46, HDL was 86.7, LDL was 113.  Chemistries were  unremarkable with a glucose of 96, BUN of 16, creatinine 1.2, SGPT minimally  elevated at 54, total bilirubin elevated at 1.9.  GFR was 67 mL per minute.  Thyroid function normal with a TSH of 2.45.  PSA was negative at 0.61.  Urinalysis was normal.   ASSESSMENT AND PLAN:  1. HIV disease:   The patient has done extremely well.  He is currently on      multi-drug HAART therapy.  He is followed by Dr. Smith Mince very closely.  2. Health maintenance:  The patient has done an excellent job in terms of      alcohol abstention, as well as being smoke-free, and he was      congratulated on these successes.  He is current with colorectal cancer      screening.   The patient was asked to return to see me in one year on a p.r.n. basis.    ______________________________  Rosalyn Gess Norins, MD    MEN/MedQ  DD: 05/25/2006  DT: 05/25/2006  Job #: [id number]   cc:   Rosanna Randy  Disease, [facility], [facility] Dr. Kellie Shropshire, Department of Infectious

## 2010-12-18 ENCOUNTER — Other Ambulatory Visit: Payer: Self-pay | Admitting: Orthopedic Surgery

## 2010-12-18 DIAGNOSIS — IMO0002 Reserved for concepts with insufficient information to code with codable children: Secondary | ICD-10-CM

## 2010-12-18 DIAGNOSIS — M545 Low back pain, unspecified: Secondary | ICD-10-CM

## 2011-01-03 ENCOUNTER — Ambulatory Visit
Admission: RE | Admit: 2011-01-03 | Discharge: 2011-01-03 | Disposition: A | Discharge status: Home / Self Care | Payer: 59 | Source: Ambulatory Visit | Attending: Orthopedic Surgery | Admitting: Orthopedic Surgery

## 2011-01-03 DIAGNOSIS — M545 Low back pain, unspecified: Secondary | ICD-10-CM

## 2011-01-03 DIAGNOSIS — IMO0002 Reserved for concepts with insufficient information to code with codable children: Secondary | ICD-10-CM

## 2011-01-06 ENCOUNTER — Other Ambulatory Visit: Payer: Self-pay | Admitting: Internal Medicine

## 2011-01-06 ENCOUNTER — Other Ambulatory Visit: Payer: Self-pay | Admitting: *Deleted

## 2011-01-06 NOTE — Telephone Encounter (Signed)
Pt requesting refill on cialis 5 mg. Please Advise refill[facility] [address] [facility] [place]

## 2011-01-08 NOTE — Telephone Encounter (Signed)
k for refills x 5

## 2011-01-09 NOTE — Telephone Encounter (Signed)
Rx Done . 

## 2011-01-31 ENCOUNTER — Encounter: Payer: Self-pay | Admitting: Internal Medicine

## 2011-02-06 ENCOUNTER — Encounter: Payer: 59 | Admitting: Internal Medicine

## 2011-02-10 ENCOUNTER — Encounter: Payer: Self-pay | Admitting: Internal Medicine

## 2011-02-10 ENCOUNTER — Ambulatory Visit (INDEPENDENT_AMBULATORY_CARE_PROVIDER_SITE_OTHER): Payer: 59 | Admitting: Internal Medicine

## 2011-02-10 ENCOUNTER — Telehealth: Payer: Self-pay | Admitting: *Deleted

## 2011-02-10 DIAGNOSIS — G8929 Other chronic pain: Secondary | ICD-10-CM

## 2011-02-10 DIAGNOSIS — M545 Low back pain, unspecified: Secondary | ICD-10-CM

## 2011-02-10 DIAGNOSIS — B20 Human immunodeficiency virus [HIV] disease: Secondary | ICD-10-CM

## 2011-02-10 DIAGNOSIS — E291 Testicular hypofunction: Secondary | ICD-10-CM

## 2011-02-10 DIAGNOSIS — Z0000 Encounter for general adult medical examination without abnormal findings: Secondary | ICD-10-CM

## 2011-02-10 MED ORDER — TESTOSTERONE ENANTHATE 200 MG/ML IM SOLN: 200.0000 mg | INTRAMUSCULAR | Status: DC

## 2011-02-10 NOTE — Assessment & Plan Note (Signed)
Well managed by chiropractic and Dr. Charlett Blake. He does take q 3 month trigger point steroid injections.

## 2011-02-10 NOTE — Assessment & Plan Note (Signed)
Stable and tolerating medication/injections without complications.

## 2011-02-10 NOTE — Assessment & Plan Note (Signed)
Doing well. He will obtain copies of labs for me.

## 2011-02-10 NOTE — Progress Notes (Signed)
Subjective:    Patient ID: Troy Marsh, male    DOB: 01/07/1951, 60 y.o.   MRN: [id number]  HPI Troy Marsh presents for routine exam. There were several questions about mail order: problem with caverject - provided patient a copy of the time and date stamped Rx response from January '12. He is also concerned about the testosterone now coming to him as single dose vials.   He is feeling well and continues to follow at the ID clinic in [facility] Dr. Servando Snare and has been tolerating his HART regimen well with good counts and no side affects.   Past Medical History  Diagnosis Date  . HIV disease     last CD4 in July 2009 ws 224   Past Surgical History  Procedure Date  . Neck surgery   . Hemorrhoid surgery    Family History  Problem Relation Age of Onset  . Coronary artery disease Father   . Heart attack Father   . Hypertension Father   . Hyperlipidemia Father   . Cancer Neg Hx     prostate and colon  . Diabetes Neg Hx    History   Social History  . Marital Status: Single    Spouse Name: Troy Marsh (partner)    Number of Children: N/A  . Years of Education: N/A   Occupational History  . retired     Printmaker work   Social History Main Topics  . Smoking status: Never Smoker   . Smokeless tobacco: Not on file  . Alcohol Use: No  . Drug Use: No  . Sexually Active: Yes     monogamous w/ single male partner 53   Other Topics Concern  . Not on file   Social History Narrative   [employer]       Review of Systems Review of Systems  Constitutional:  Negative for fever, chills, activity change and unexpected weight change.  HEENT:  Negative for hearing loss, ear pain, congestion, neck stiffness and postnasal drip. Negative for sore throat or swallowing problems. Negative for dental complaints.   Eyes: Negative for vision loss or change in visual acuity.  Respiratory: Negative for chest tightness and wheezing.   Cardiovascular: Negative for chest pain  and palpitation. No decreased exercise tolerance Gastrointestinal: No change in bowel habit. No bloating or gas. No reflux or indigestion Genitourinary: Negative for urgency, frequency, flank pain and difficulty urinating.  Musculoskeletal: Negative for myalgias, arthralgias and gait problem.  Neurological: Negative for dizziness, tremors, weakness and headaches.  Hematological: Negative for adenopathy.  Psychiatric/Behavioral: Negative for behavioral problems and dysphoric mood.       Objective:   Physical Exam Vital signs reviewed-normal Gen'l: Well nourished, slender, overly tanned white male in no acute distress  HENT:  Head: Normocephalic and atraumatic.  Right Ear: External ear normal. EAC/TM nl Left Ear: External ear normal.  EAC/TM nl Nose: Nose normal.  Mouth/Throat: Oropharynx is clear and moist. Dentition - native, in good repair. No buccal or palatal lesions. Posterior pharynx clear. Eyes: Conjunctivae and sclera clear. EOM intact. Pupils are equal, round, and reactive to light. Right eye exhibits no discharge. Left eye exhibits no discharge. Neck: Normal range of motion. Neck supple. No JVD present. No tracheal deviation present. No thyromegaly present.  Cardiovascular: Normal rate, regular rhythm, no gallop, no friction rub, no murmur heard.      Quiet precordium. 2+ radial and DP pulses . No carotid bruits Pulmonary/Chest: Effort normal. No respiratory distress or increased  WOB, no wheezes, no rales. No chest wall deformity or CVAT. Abdominal: Soft. Bowel sounds are normal in all quadrants. He exhibits no distension, no tenderness, no rebound or guarding, No heptosplenomegaly  Genitourinary:  deferred Musculoskeletal: Normal range of motion. He exhibits no edema and no tenderness.       Small and large joints without redness, synovial thickening or deformity. Full range of motion preserved about all small, median and large joints.  Lymphadenopathy:    He has no cervical  or supraclavicular adenopathy.  Neurological: He is alert and oriented to person, place, and time. CN II-XII intact. DTRs 2+ and symmetrical biceps, radial and patellar tendons. Cerebellar function normal with no tremor, rigidity, normal gait and station.  Skin: Skin is warm and dry. No rash noted. No erythema. Too tanned. Several areas of dry, scaling skin - pinnae right, forearm, hands. Chronic discoloration of right thenar iminence  Psychiatric: He has a normal mood and affect. His behavior is normal. Thought content normal.         Assessment & Plan:   No problem-specific assessment & plan notes found for this encounter.

## 2011-02-10 NOTE — Telephone Encounter (Signed)
Patient requesting RX RF of caverject. Need to look up details of RX from [facility] and CALL IN rx due to previous problems w/pharmacy. OK for RF per MD   Need to call [facility] [phone]

## 2011-02-10 NOTE — Assessment & Plan Note (Signed)
Interval history is negative. PHysical exam is normal. He has had routine labs at [facility]- copies pending. He is current with colorectal cancer screening with last studyin '03, due for follow-up colonoscopy next year. Immunizations - he is current with pneumonia and shingles vaccine - given in [place], current with Tdap.  In summary - a very nice man who appears to be medically stable. He will return prn or 1 year. He is advised to avoid tanning beds and excessive sun exposure. He should pay close attention to any new skin lesions.

## 2011-02-13 NOTE — Telephone Encounter (Signed)
Left Pt VM - [facility] # is for patient, no option for MD office to call in RX. [facility], need to clear up pharm info and then call in.    Caverject 20 mcg once daily PRN # 39 w/3 rfs needs to be CALLED INTO pharm.

## 2011-02-14 NOTE — Telephone Encounter (Signed)
Pt left Vm - he has plenty of caverject at this time and does not need RF now, will call when needed.

## 2011-02-27 ENCOUNTER — Other Ambulatory Visit: Payer: Self-pay | Admitting: Orthopedic Surgery

## 2011-02-27 DIAGNOSIS — IMO0002 Reserved for concepts with insufficient information to code with codable children: Secondary | ICD-10-CM

## 2011-02-27 DIAGNOSIS — M545 Low back pain, unspecified: Secondary | ICD-10-CM

## 2011-03-07 ENCOUNTER — Ambulatory Visit
Admission: RE | Admit: 2011-03-07 | Discharge: 2011-03-07 | Disposition: A | Discharge status: Home / Self Care | Payer: 59 | Source: Ambulatory Visit | Attending: Orthopedic Surgery | Admitting: Orthopedic Surgery

## 2011-03-07 DIAGNOSIS — G8929 Other chronic pain: Secondary | ICD-10-CM

## 2011-03-07 DIAGNOSIS — M545 Low back pain, unspecified: Secondary | ICD-10-CM

## 2011-03-07 DIAGNOSIS — IMO0002 Reserved for concepts with insufficient information to code with codable children: Secondary | ICD-10-CM

## 2011-03-07 MED ORDER — IOHEXOL 180 MG/ML  SOLN
1.0000 mL | Freq: Once | INTRAMUSCULAR | Status: AC | PRN
  Administered 2011-03-07: 1 mL via EPIDURAL

## 2011-03-07 MED ORDER — METHYLPREDNISOLONE ACETATE 40 MG/ML INJ SUSP (RADIOLOG
120.0000 mg | Freq: Once | INTRAMUSCULAR | Status: AC
  Administered 2011-03-07: 120 mg via EPIDURAL

## 2011-03-23 ENCOUNTER — Other Ambulatory Visit: Payer: Self-pay | Admitting: Orthopedic Surgery

## 2011-03-23 DIAGNOSIS — M545 Low back pain, unspecified: Secondary | ICD-10-CM

## 2011-03-23 DIAGNOSIS — M5430 Sciatica, unspecified side: Secondary | ICD-10-CM

## 2011-04-17 ENCOUNTER — Other Ambulatory Visit: Payer: 59

## 2011-04-18 ENCOUNTER — Other Ambulatory Visit: Payer: Self-pay | Admitting: Internal Medicine

## 2011-04-18 NOTE — Telephone Encounter (Signed)
Please advise 

## 2011-04-19 NOTE — Telephone Encounter (Signed)
Ok for refill  x3 

## 2011-04-20 ENCOUNTER — Ambulatory Visit (INDEPENDENT_AMBULATORY_CARE_PROVIDER_SITE_OTHER): Payer: 59 | Admitting: *Deleted

## 2011-04-20 DIAGNOSIS — Z23 Encounter for immunization: Secondary | ICD-10-CM

## 2011-04-25 ENCOUNTER — Ambulatory Visit
Admission: RE | Admit: 2011-04-25 | Discharge: 2011-04-25 | Disposition: A | Discharge status: Home / Self Care | Payer: 59 | Source: Ambulatory Visit | Attending: Orthopedic Surgery | Admitting: Orthopedic Surgery

## 2011-04-25 DIAGNOSIS — M5430 Sciatica, unspecified side: Secondary | ICD-10-CM

## 2011-04-25 DIAGNOSIS — M545 Low back pain, unspecified: Secondary | ICD-10-CM

## 2011-04-25 MED ORDER — METHYLPREDNISOLONE ACETATE 40 MG/ML INJ SUSP (RADIOLOG
120.0000 mg | Freq: Once | INTRAMUSCULAR | Status: AC
  Administered 2011-04-25: 120 mg via EPIDURAL

## 2011-04-25 MED ORDER — IOHEXOL 180 MG/ML  SOLN
1.0000 mL | Freq: Once | INTRAMUSCULAR | Status: AC | PRN
  Administered 2011-04-25: 1 mL via EPIDURAL

## 2011-05-23 ENCOUNTER — Other Ambulatory Visit: Payer: Self-pay | Admitting: Orthopedic Surgery

## 2011-05-23 DIAGNOSIS — M545 Low back pain, unspecified: Secondary | ICD-10-CM

## 2011-06-05 ENCOUNTER — Other Ambulatory Visit: Payer: Self-pay | Admitting: *Deleted

## 2011-06-05 MED ORDER — "INSULIN SYRINGE 29G X 1/2"" 1 ML MISC": 1.0000 | Status: DC

## 2011-07-05 ENCOUNTER — Ambulatory Visit
Admission: RE | Admit: 2011-07-05 | Discharge: 2011-07-05 | Disposition: A | Discharge status: Home / Self Care | Payer: 59 | Source: Ambulatory Visit | Attending: Orthopedic Surgery | Admitting: Orthopedic Surgery

## 2011-07-05 ENCOUNTER — Other Ambulatory Visit: Payer: Self-pay | Admitting: Orthopedic Surgery

## 2011-07-05 VITALS — BP 164/93 | HR 59

## 2011-07-05 DIAGNOSIS — M545 Low back pain, unspecified: Secondary | ICD-10-CM

## 2011-07-05 DIAGNOSIS — M5090 Cervical disc disorder, unspecified, unspecified cervical region: Secondary | ICD-10-CM

## 2011-07-05 MED ORDER — METHYLPREDNISOLONE ACETATE 40 MG/ML INJ SUSP (RADIOLOG
120.0000 mg | Freq: Once | INTRAMUSCULAR | Status: AC
  Administered 2011-07-05: 120 mg via EPIDURAL

## 2011-07-05 MED ORDER — IOHEXOL 180 MG/ML  SOLN
1.0000 mL | Freq: Once | INTRAMUSCULAR | Status: AC | PRN
  Administered 2011-07-05: 1 mL via EPIDURAL

## 2011-08-17 ENCOUNTER — Other Ambulatory Visit: Payer: Self-pay

## 2011-08-17 MED ORDER — "SYRINGE/NEEDLE (DISP) 23G X 1-1/2"" 3 ML MISC": 1.0000 | Status: DC

## 2011-09-07 ENCOUNTER — Ambulatory Visit
Admission: RE | Admit: 2011-09-07 | Discharge: 2011-09-07 | Disposition: A | Discharge status: Home / Self Care | Payer: 59 | Source: Ambulatory Visit | Attending: Orthopedic Surgery | Admitting: Orthopedic Surgery

## 2011-09-07 DIAGNOSIS — M545 Low back pain, unspecified: Secondary | ICD-10-CM

## 2011-09-08 ENCOUNTER — Other Ambulatory Visit: Payer: 59

## 2011-09-15 ENCOUNTER — Telehealth: Payer: Self-pay | Admitting: Internal Medicine

## 2011-09-15 NOTE — Telephone Encounter (Signed)
The pt called and is hoping to get an rx for Zyrtec [facility] in [place].    Thanks!

## 2011-09-19 NOTE — Telephone Encounter (Signed)
No previous script for this medication, available OTC; Pt understood.

## 2011-09-20 ENCOUNTER — Other Ambulatory Visit: Payer: Self-pay | Admitting: *Deleted

## 2011-09-20 MED ORDER — "SYRINGE/NEEDLE (DISP) 23G X 1-1/2"" 3 ML MISC": 1.0000 | Status: DC

## 2011-11-01 ENCOUNTER — Other Ambulatory Visit: Payer: Self-pay | Admitting: Orthopedic Surgery

## 2011-11-01 DIAGNOSIS — M545 Low back pain, unspecified: Secondary | ICD-10-CM

## 2011-11-02 ENCOUNTER — Ambulatory Visit
Admission: RE | Admit: 2011-11-02 | Discharge: 2011-11-02 | Disposition: A | Discharge status: Home / Self Care | Payer: 59 | Source: Ambulatory Visit | Attending: Orthopedic Surgery | Admitting: Orthopedic Surgery

## 2011-11-02 VITALS — BP 138/76 | HR 67

## 2011-11-02 DIAGNOSIS — M545 Low back pain, unspecified: Secondary | ICD-10-CM

## 2011-11-02 MED ORDER — METHYLPREDNISOLONE ACETATE 40 MG/ML INJ SUSP (RADIOLOG
120.0000 mg | Freq: Once | INTRAMUSCULAR | Status: AC
  Administered 2011-11-02: 120 mg via EPIDURAL

## 2011-11-02 MED ORDER — IOHEXOL 180 MG/ML  SOLN
1.0000 mL | Freq: Once | INTRAMUSCULAR | Status: AC | PRN
  Administered 2011-11-02: 1 mL via EPIDURAL

## 2011-11-15 ENCOUNTER — Emergency Department (HOSPITAL_COMMUNITY)
Admission: EM | Admit: 2011-11-15 | Discharge: 2011-11-16 | Disposition: A | Discharge status: Home / Self Care | Payer: 59 | Attending: Emergency Medicine | Admitting: Emergency Medicine

## 2011-11-15 ENCOUNTER — Encounter (HOSPITAL_COMMUNITY): Payer: Self-pay | Admitting: Emergency Medicine

## 2011-11-15 DIAGNOSIS — Z79899 Other long term (current) drug therapy: Secondary | ICD-10-CM | POA: Insufficient documentation

## 2011-11-15 DIAGNOSIS — S6990XA Unspecified injury of unspecified wrist, hand and finger(s), initial encounter: Secondary | ICD-10-CM | POA: Insufficient documentation

## 2011-11-15 DIAGNOSIS — Z87891 Personal history of nicotine dependence: Secondary | ICD-10-CM | POA: Insufficient documentation

## 2011-11-15 DIAGNOSIS — IMO0002 Reserved for concepts with insufficient information to code with codable children: Secondary | ICD-10-CM | POA: Insufficient documentation

## 2011-11-15 DIAGNOSIS — S6980XA Other specified injuries of unspecified wrist, hand and finger(s), initial encounter: Secondary | ICD-10-CM | POA: Insufficient documentation

## 2011-11-15 DIAGNOSIS — I10 Essential (primary) hypertension: Secondary | ICD-10-CM | POA: Insufficient documentation

## 2011-11-15 DIAGNOSIS — Z7982 Long term (current) use of aspirin: Secondary | ICD-10-CM | POA: Insufficient documentation

## 2011-11-15 DIAGNOSIS — Z21 Asymptomatic human immunodeficiency virus [HIV] infection status: Secondary | ICD-10-CM | POA: Insufficient documentation

## 2011-11-15 HISTORY — DX: Essential (primary) hypertension: I10

## 2011-11-15 NOTE — ED Notes (Signed)
Patient was seen at Urgent Care for his right index finger (patient was pricked by a cactus plant --name: Elvera Bicker -- yesterday).  Dr. Manson Passey at Urgent Care wanted patient to see ED because he feels that patient is not getting circulation in that finger.  Pad of right index finger black/bluish in color.  Patient denies pain, but reports numbness in finger.

## 2011-11-15 NOTE — ED Provider Notes (Signed)
History     CSN: [id number]  Arrival date & time 11/15/11  2054   First MD Initiated Contact with Patient 11/15/11 2353      Chief Complaint  Patient presents with  . 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  He is sent here because pt has numbness to finger and worries of poor circulation due to black/bluish color to index finger.  Pt sts he has notices improvement in his finger.  It is not as purple as it has been, and sensation is present.  Denies joint pain.  Denies hx of afib.  Pain has improved.    Past Medical History  Diagnosis Date  . HIV disease     last CD4 in July 2009 ws 224  . Hypertension     Past Surgical History  Procedure Date  . Neck surgery   . Hemorrhoid surgery     Family History  Problem Relation Age of Onset  . Coronary artery disease Father   . Heart attack Father   . Hypertension Father   . Hyperlipidemia Father   . Cancer Neg Hx     prostate and colon  . Diabetes Neg Hx     History  Substance Use Topics  . Smoking status: Former Smoker -- 2.0 packs/day for 35 years    Types: Cigarettes    Quit date: 02/10/2004  . Smokeless tobacco: Never Used  . Alcohol Use: No     quit 2004      Review of Systems  Constitutional: Negative for fever.  Cardiovascular: Negative for chest pain and palpitations.  Skin: Negative for rash.    Allergies  Review of patient's allergies indicates no known allergies.  Home Medications   Current Outpatient Rx  Name Route Sig Dispense Refill  . ALPROSTADIL (VASODILATOR) 20 MCG IC SOLR      . ASPIRIN 81 MG PO TABS Oral Take 81 mg by mouth daily.      . CYCLOBENZAPRINE HCL 5 MG PO TABS Oral Take 5 mg by mouth as needed.      . EFAVIRENZ-EMTRICITAB-TENOFOVIR 600-200-300 MG PO TABS Oral Take 1 tablet  by mouth at bedtime.      . ETODOLAC 500 MG PO TABS Oral Take 500 mg by mouth daily.      [name] GARLIC 500 MG PO TABS Oral Take by mouth.      [name] HYDROCHLOROTHIAZIDE 50 MG PO TABS Oral Take 50 mg by mouth daily.      [name] HYDROCODONE-ACETAMINOPHEN 7.5-750 MG PO TABS Oral Take by mouth. Take 2-3 tabs daily     . MELATONIN 5 MG PO TABS Oral Take by mouth.      . MULTIVITAMINS PO TABS Oral Take 1 tablet by mouth daily.      [name] NAPROXEN SODIUM 550 MG PO TABS Oral Take 550 mg by mouth daily.      . NON FORMULARY  Bacterio-static water- use with Caverject     . FISH OIL 1000 MG PO CAPS Oral Take 1 capsule by mouth daily.      [name] TADALAFIL 5 MG PO TABS Oral Take 5 mg by mouth daily as needed.    . TESTOSTERONE ENANTHATE 200 MG/ML IM OIL Intramuscular Inject 200 mg into the muscle every 14 (fourteen) days.    [name] ZINC 50 MG PO TABS Oral Take  1 tablet by mouth daily.      [name] HORNY GOAT WEED PLUS PO CAPS Oral Take by mouth.        BP 128/76  Pulse 65  Temp(Src) 98.3 F (36.8 C) (Oral)  Resp 16  SpO2 99%  Physical Exam  Constitutional: He appears well-developed and well-nourished. No distress.  HENT:  Head: Atraumatic.  Neck: Neck supple.  Cardiovascular: Normal rate and regular rhythm.   Pulmonary/Chest: Effort normal.  Neurological: He is alert.  Skin: Skin is warm.       R index finger: pinpoint lesion to pad of finger with mild tenderness on palpation. No fb seen.  Tip of finger with normal cap refill. Normal skin tone, no joint pain, sensation intact    ED Course  Procedures (including critical care time)  Labs Reviewed - No data to display No results found.   No diagnosis found.    MDM  Reevaluation of R index finger from recent thorn prick from cactus.  No evidence of circulation compromise on exam.  No other risk factor.  Is HIV positive but currently taking treatment.  My attending also evaluate but agrees that there are no signs of infection or circulation compromise.  Recommend  soaking with warm water and return if signs of infection.  Pt voice understanding.         Fayrene Helper, PA-C 11/16/11 0012

## 2011-11-16 NOTE — Discharge Instructions (Signed)

## 2011-11-16 NOTE — ED Provider Notes (Signed)
Medical screening examination/treatment/procedure(s) were conducted as a shared visit with non-physician practitioner(s) and myself.  I personally evaluated the patient during the encounter  Right first finger, site of puncture from a cactus spine yesterday: Slight swelling of the distal pad, no erythema, discharge, fluctuance or palpable foreign body. Capillary refill is normal in the finger.  Suspect slight inflammation from cactus puncture. Doubt severe infection, embolus to finger, allergic reaction.  Flint Melter, MD 11/16/11 [phone]

## 2011-11-16 NOTE — ED Provider Notes (Signed)
Medical screening examination/treatment/procedure(s) were performed by non-physician practitioner and as supervising physician I was immediately available for consultation/collaboration.  Donnetta Hutching, MD 11/16/11 2249

## 2011-12-15 ENCOUNTER — Other Ambulatory Visit: Payer: Self-pay | Admitting: Orthopedic Surgery

## 2011-12-15 DIAGNOSIS — M549 Dorsalgia, unspecified: Secondary | ICD-10-CM

## 2012-01-05 ENCOUNTER — Ambulatory Visit
Admission: RE | Admit: 2012-01-05 | Discharge: 2012-01-05 | Disposition: A | Discharge status: Home / Self Care | Payer: 59 | Source: Ambulatory Visit | Attending: Orthopedic Surgery | Admitting: Orthopedic Surgery

## 2012-01-05 VITALS — BP 164/86 | HR 61

## 2012-01-05 DIAGNOSIS — M549 Dorsalgia, unspecified: Secondary | ICD-10-CM

## 2012-01-05 MED ORDER — METHYLPREDNISOLONE ACETATE 40 MG/ML INJ SUSP (RADIOLOG
120.0000 mg | Freq: Once | INTRAMUSCULAR | Status: AC
  Administered 2012-01-05: 120 mg via EPIDURAL

## 2012-01-05 MED ORDER — IOHEXOL 180 MG/ML  SOLN
1.0000 mL | Freq: Once | INTRAMUSCULAR | Status: AC | PRN
  Administered 2012-01-05: 1 mL via EPIDURAL

## 2012-01-10 ENCOUNTER — Other Ambulatory Visit: Payer: Self-pay | Admitting: Internal Medicine

## 2012-01-11 NOTE — Telephone Encounter (Signed)
PATIENT REQUEST REFILL ON CIALIS. LAST OV 01/31/2011

## 2012-02-14 ENCOUNTER — Other Ambulatory Visit: Payer: Self-pay | Admitting: Orthopedic Surgery

## 2012-02-14 DIAGNOSIS — M545 Low back pain, unspecified: Secondary | ICD-10-CM

## 2012-02-19 ENCOUNTER — Ambulatory Visit
Admission: RE | Admit: 2012-02-19 | Discharge: 2012-02-19 | Disposition: A | Discharge status: Home / Self Care | Payer: 59 | Source: Ambulatory Visit | Attending: Orthopedic Surgery | Admitting: Orthopedic Surgery

## 2012-02-19 VITALS — BP 138/84 | HR 70

## 2012-02-19 DIAGNOSIS — M545 Low back pain, unspecified: Secondary | ICD-10-CM

## 2012-02-19 MED ORDER — METHYLPREDNISOLONE ACETATE 40 MG/ML INJ SUSP (RADIOLOG
120.0000 mg | Freq: Once | INTRAMUSCULAR | Status: AC
  Administered 2012-02-19: 120 mg via EPIDURAL

## 2012-02-19 MED ORDER — IOHEXOL 180 MG/ML  SOLN
1.0000 mL | Freq: Once | INTRAMUSCULAR | Status: AC | PRN
  Administered 2012-02-19: 1 mL via EPIDURAL

## 2012-02-20 ENCOUNTER — Other Ambulatory Visit: Payer: Self-pay | Admitting: *Deleted

## 2012-02-20 MED ORDER — TESTOSTERONE ENANTHATE 200 MG/ML IM SOLN: 200.0000 mg | INTRAMUSCULAR | Status: DC

## 2012-02-20 NOTE — Telephone Encounter (Signed)
REFILL TESTOSTERONE .Marland KitchenMarland KitchenINJECTION .[name] PRINTED .Marland KitchenFOR DR. PLOTNIKOV TO SIGN . DR. Debby Bud OUT OF OFFICE.

## 2012-02-21 ENCOUNTER — Telehealth: Payer: Self-pay | Admitting: Internal Medicine

## 2012-02-21 NOTE — Telephone Encounter (Signed)
Called patient to let him know Rx for testosterone had been sent in [facility]2012, Dr. Posey Rea had refilled Rx in Dr. Odessa Fleming absence. And has 3 refills on this Rx.

## 2012-02-21 NOTE — Telephone Encounter (Signed)
OK for refills on testosterone Rx for the year.

## 2012-02-21 NOTE — Telephone Encounter (Signed)
Caller: Yostin/Patient; PCP: Illene Regulus; CB#: [phone]; ; ; Call regarding Notification of Fax From Pharmacy; states testosterone Rx has no more available refills.  States pharmacist will be faxing request for refill, but wants to notify office staff as well.  Last office visit July 2012, and has scheduled appointment for well visit in the office, but first available well visit is scheduled for March 26, 2012 at 1330, and will need bridge prescriptions until then.    [facility]   MAY REACH PATIENT [phone].

## 2012-03-15 ENCOUNTER — Other Ambulatory Visit: Payer: Self-pay | Admitting: Internal Medicine

## 2012-03-26 ENCOUNTER — Ambulatory Visit (INDEPENDENT_AMBULATORY_CARE_PROVIDER_SITE_OTHER): Payer: 59 | Admitting: Internal Medicine

## 2012-03-26 ENCOUNTER — Encounter: Payer: Self-pay | Admitting: Internal Medicine

## 2012-03-26 VITALS — BP 122/80 | HR 75 | Temp 96.7°F | Resp 18 | Ht 70.0 in | Wt 150.8 lb

## 2012-03-26 DIAGNOSIS — B20 Human immunodeficiency virus [HIV] disease: Secondary | ICD-10-CM

## 2012-03-26 DIAGNOSIS — E291 Testicular hypofunction: Secondary | ICD-10-CM

## 2012-03-26 DIAGNOSIS — Z0000 Encounter for general adult medical examination without abnormal findings: Secondary | ICD-10-CM

## 2012-03-26 NOTE — Progress Notes (Signed)
Subjective:     Patient ID: Troy Marsh, male   DOB: [birth year], 61 y.o.   MRN: [id number]  HPI Comments: Mr. Bartunek is a 61 yo male with a history of HIV who has come to clinic today for his annual exam. He reports that he has not had any changes in his health since his last visit. He reports that he is taking his medications as prescribed. He states that he has no trouble sleeping and sleeps well at night. At his visit today I reviewed the patient's medications and history. His history is unchanged. He sees Dr. Daleen Squibb at [facility] for his HIV, and his last CD4 count in January was 296. He also regularly visits an ophthalmologist and dermatologist for care.  He uses a tanning bed twice a week in winter, and sunbaths in the natural sun frequently in the summertime. He reports that he uses tanning oils, but not sunscreen. He mentioned that his skin feels very dry and that he uses olive oil to help moisturize his skin. I discussed the importance of using sunscreen with Mr. Laster. He was amenable to trying sunscreen with his tanning.    Review of Systems  Genitourinary: Negative for dysuria, decreased urine volume, penile swelling, scrotal swelling, difficulty urinating, genital sores, penile pain and testicular pain.  Neurological:       No changes in gait or trouble walking.  All other systems reviewed and are negative.   Past Medical History  Diagnosis Date  . HIV disease     last CD4 in July 2009 ws 224  . Hypertension    Past Surgical History  Procedure Date  . Neck surgery   . Hemorrhoid surgery    Family History  Problem Relation Age of Onset  . Coronary artery disease Father   . Heart attack Father   . Hypertension Father   . Hyperlipidemia Father   . Cancer Neg Hx     prostate and colon  . Diabetes Neg Hx    History   Social History  . Marital Status: Single    Spouse Name: Fredrik Cove (partner)    Number of Children: N/A  . Years of Education: 16    Occupational History  . retired     Printmaker work   Social History Main Topics  . Smoking status: Former Smoker -- 2.0 packs/day for 35 years    Types: Cigarettes    Quit date: 02/10/2004  . Smokeless tobacco: Never Used  . Alcohol Use: No     quit 2004  . Drug Use: No  . Sexually Active: Yes -- Male partner(s)    Birth Control/ Protection: Condom     monogamous w/ single male partner 71   Other Topics Concern  . Not on file   Social History Narrative   [employer]. Work - [place] [employer] - retired 2009. Long term monogamous relationship. Lives in his own home.     Current Outpatient Prescriptions on File Prior to Visit  Medication Sig Dispense Refill  . Alprostadil, Vasodilator, (CAVERJECT) 20 MCG SOLR       . aspirin 81 MG tablet Take 81 mg by mouth daily.        [name] CIALIS 5 MG tablet TAKE 1 TABLET EVERY DAY  30 tablet  0  . hydrochlorothiazide 50 MG tablet Take 50 mg by mouth daily.        [name] HYDROcodone-acetaminophen (VICODIN ES) 7.5-750 MG per tablet Take by mouth. Take  2-3 tabs daily       . Melatonin 5 MG TABS Take by mouth.        . multivitamin (THERAGRAN) per tablet Take 1 tablet by mouth daily.        [name] testosterone enanthate (DELATESTRYL) 200 MG/ML injection Inject 1 mL (200 mg total) into the muscle every 14 (fourteen) days.  5 mL  3  . cyclobenzaprine (FLEXERIL) 5 MG tablet Take 5 mg by mouth as needed.        [name] efavirenz-emtrictabine-tenofovir (ATRIPLA) 600-200-300 MG per tablet Take 1 tablet by mouth at bedtime.        [name] etodolac (LODINE) 500 MG tablet Take 500 mg by mouth daily.        . Garlic 500 MG TABS Take by mouth.        . Misc Natural Products (HORNY GOAT WEED PLUS) CAPS Take by mouth.        . naproxen sodium (ANAPROX) 550 MG tablet Take 550 mg by mouth daily.        . NON FORMULARY Bacterio-static water- use with Caverject       . Omega-3 Fatty Acids (FISH OIL) 1000 MG CAPS Take 1 capsule by mouth daily.        . Zinc 50  MG TABS Take 1 tablet by mouth daily.              Objective:   Physical Exam  Nursing note and vitals reviewed. Constitutional: He is oriented to person, place, and time. He appears well-nourished. No distress.  HENT:  Head: Normocephalic and atraumatic.    Right Ear: External ear normal.  Left Ear: External ear normal.  Nose: Nose normal.  Mouth/Throat: Oropharynx is clear and moist. No oropharyngeal exudate.  Eyes: Conjunctivae are normal. Pupils are equal, round, and reactive to light. Right eye exhibits no discharge. Left eye exhibits no discharge. No scleral icterus.  Neck: Normal range of motion. Neck supple. No thyromegaly present.  Cardiovascular: Normal rate, regular rhythm, normal heart sounds and intact distal pulses.  Exam reveals no gallop and no friction rub.   No murmur heard.      No carotid bruits  Pulmonary/Chest: Effort normal and breath sounds normal. No respiratory distress. He has no wheezes. He has no rales. He exhibits no tenderness.  Abdominal: Soft. Bowel sounds are normal. He exhibits no distension and no mass. There is no tenderness (non-tender to deep or light palpation). There is no guarding.       Lower liver margin at right costal boarder. No splenomegaly. No abdominal bruits  Musculoskeletal: Normal range of motion. He exhibits no edema and no tenderness (legs and arms not tender to palpation).       5/5 muscle strength in all joints.  Lymphadenopathy:    He has no cervical adenopathy.  Neurological: He is alert and oriented to person, place, and time. No cranial nerve deficit (CN 2-12 intact). Coordination normal.  Skin: Skin is warm and dry. No rash noted. He is not diaphoretic. No erythema. No pallor.       No abnormal skin lesions noted on chest, back, legs, or feet. He had no lesions behind his ears or in his nares. One raised red lesion was noted on the right side of his nose with visible microvasculature.  Psychiatric: He has a normal mood and  affect. His behavior is normal. Judgment and thought content normal.   Filed Vitals:   03/26/12 1346  BP: 122/80  Pulse: 75  Temp: 96.7 F (35.9 C)  Resp: 18   Filed Weights   03/26/12 1346  Weight: 150 lb 12.8 oz (68.402 kg)   Lab results - reviewed last labs from [facility] - normal chemistries, lipid panel.     Assessment & Plan:     1. Annual Exam - The patient is managing his HIV well and compliant with medications. He is able to perform all functions of daily living without difficulty. He is overdue for a colonoscopy and wants a flu vaccine.   PLAN - administer flu vaccine. Advise patient to call back when he has a date to schedule a colonoscopy. 2. Sunscreen Use - Patient does not use sunscreen when tanning and uses tanning beds in wintertime.  PLAN - I counseled the patient on the importance of sunscreen use when outdoors and tanning  and briefed him on potential complications of unprotected sun exposure. I attached a pertinent  article to the AVS for the patient to read.     Attending note: patient interviewed and part of exam repeated. See problem oriented charting. He appears to be doing well at this time. Agree with Mr. Areta Haber exam and recommendations.

## 2012-03-26 NOTE — Assessment & Plan Note (Signed)
Patient reports that his caverject is working well for him and has no complaints. He would like a refill on this medication. He stated he would like the large supply of this medication because it is more convenient and covered by his insurance.  PLAN - Refill patient's caverject with the large supply.

## 2012-03-26 NOTE — Assessment & Plan Note (Addendum)
Patient's last CD4 count was 296 in January 2013. His HIV is well controled. He reports no changes in symptoms.  PLAN - Have patient keep managing his HIV with Dr. Daleen Squibb in [facility] ID department.

## 2012-03-26 NOTE — Patient Instructions (Addendum)
When you tan, please use sunscreen that protects against UVA and UVB rays. The higher the SPF, the better.  You are due for a colonoscopy this year. We will give you a referal when you have a date. Please call us back to schedule a colonoscopy.  You have received your flu vaccine today. This vaccine is good until next year at this time.  You had mentioned that you had dry skin, so I attached an article to this summary I think you would benefit from.  We will send you the results of your lab work in the mail.  Sun-Exposed Skin The skin is prone to change with aging. Its appendages are as well. This refers mostly to the hair and nails. The most common of all of the aging changes is graying hair. That is an example of a skin appendage change. CAUSES   Many factors may affect the speed of the aging process in the skin.  Exposure to sunlight speeds aging changes in the skin more than any other factor.  SYMPTOMS   Signs of aging of the skin may include:  Thinning and loss of pigmentation.   Dryness.   Wrinkling.   Decreased flexibility.   Likelihood of being fragile.   Bruising easily.   Dilated, visible blood vessels (telangiectases).   Hair loss.   Decreased sweating.  Some of these changes seem to be accelerated with sun exposure. Signs of sun exposure may include:  Liver spots (intense, dark pigmentation).   Thickened areas (keratoses).   Sunlight exposure (actinic exposure) may lead to cancer.  TREATMENT Preventive measures should begin in childhood. Methods to protect the skin from sun damage include:  Limited sun exposure.   Protection with clothing, hats, sunscreens.   Behavioral modification.   Most of [place] grew up at a time when a "healthy" tan was desired. As a result, damage from sun exposure has already occurred for most elderly patients. This is hardly reversible. But further damage can be prevented by using protection against the sun.   It is never too  late to begin a program of sun protection to limit further damage.   Some prescription topical creams or lotions may improve changes in the skin. Ask your caregiver for possible options.  Document Released: 08/05/2010 Document Revised: 06/22/2011 Document Reviewed: 08/05/2010 [facility] Patient Information 2012 [place], [place].

## 2012-03-27 ENCOUNTER — Telehealth: Payer: Self-pay | Admitting: Internal Medicine

## 2012-03-27 NOTE — Telephone Encounter (Signed)
Caller: [name]/Patient; Patient Name: Troy Marsh; PCP: Illene Regulus (Adults only); Best Callback Phone Number: [phone]; Is calling about new prescription that was to be called in increasing the amount dispensed of his testosterone enanthate (DELATESTRYL) 200 MG/ML injection. Has physical 03/26/12 and understood that this was to be called in.  Patient not looking for a dosing increase but in quantity dispensed.  With last refill pharmacy sent to 100 mg/1cc vial for dosing instead of the 5ml vials previously ordered which patient prefers and his insurance pays for.  In investigating, RN noted script [facility] to [facility] at [phone] had been written  for 200mg /70ml of medication and to dispense 5ml vial written by Dr. Posey Rea.  The change appears to be at the pharmacy level.  Patient then stated that he remembered a call from [facility] say that they were out of the medication but he got a UPS package the next day with the 2 100mg /33ml vials making him think that the concentration and amount dispensed at been changed.  RN notes she will have office follow up as needed.  Patient states he will do the same with [facility].  Patient denies need for triage at this time.  Was just calling about medication.    OFFICE: PLEASE REVIEW AND FOLLOW UP WITH [facility] AND PATIENT AS/IF NEEDED.  LAST VISIT 03/26/12; MEDICATION IN QUESTION PRESCRIBED BY DR. Debby Bud; PATIENT HAS ENOUGH AT THIS TIME WITH REFILLS, BUT WANTED THE BIGGER VIALS VS THE SMALLER ONES HE GOT LAST.  THANKS.

## 2012-03-28 ENCOUNTER — Telehealth: Payer: Self-pay | Admitting: *Deleted

## 2012-03-28 NOTE — Telephone Encounter (Signed)
COPIES OF RECORD MAILED TO DR. DAVID [facility]

## 2012-03-28 NOTE — Assessment & Plan Note (Addendum)
Interval medical history is benign. Physical exam normal. Reviewed most recent labs from [facility] - normal lipids, chemistries and hematology. He is due for follow 10 yr colonoscopy. Immunizations are up to date. He is advised to take care with sun exposure.  In summary - a delightful man who is doing well. He will return in 12 year or as needed.

## 2012-04-05 ENCOUNTER — Telehealth: Payer: Self-pay | Admitting: Internal Medicine

## 2012-04-05 NOTE — Telephone Encounter (Signed)
Caller: Troy Marsh/Patient; Phone: [phone]; Reason for Call: Patient calling to request a refill on Testosterone enanthate 5ml.  States Dr.  Debby Bud had called in Testosterone Cypionate but it does not come in the 5ml.  Please call him back.  Thanks

## 2012-04-08 NOTE — Telephone Encounter (Signed)
Please correct Rx: multi dose vial for testosterone enanthate. thanks

## 2012-04-09 MED ORDER — TESTOSTERONE ENANTHATE 200 MG/ML IM SOLN: 200.0000 mg | INTRAMUSCULAR | Status: DC

## 2012-04-09 NOTE — Telephone Encounter (Signed)
[facility] faxed to [facility]

## 2012-04-10 ENCOUNTER — Other Ambulatory Visit: Payer: Self-pay | Admitting: *Deleted

## 2012-04-10 MED ORDER — ALPROSTADIL (VASODILATOR) 20 MCG IC SOLR: 20.0000 ug | INTRACAVERNOUS | Status: DC | PRN

## 2012-04-10 MED ORDER — TESTOSTERONE ENANTHATE 200 MG/ML IM SOLN: 200.0000 mg | INTRAMUSCULAR | Status: DC

## 2012-04-10 NOTE — Telephone Encounter (Signed)
PATIENT MAILED LETTER STATE HE WANT MEDICATIONS TO GO TO [facility]  NOW. REFILLS ON MEDS SENT TO [facility]

## 2012-04-11 ENCOUNTER — Ambulatory Visit: Payer: 59

## 2012-04-17 ENCOUNTER — Other Ambulatory Visit: Payer: Self-pay | Admitting: *Deleted

## 2012-04-17 MED ORDER — TESTOSTERONE ENANTHATE 200 MG/ML IM SOLN: 200.0000 mg | INTRAMUSCULAR | Status: DC

## 2012-04-17 MED ORDER — ALPROSTADIL (VASODILATOR) 20 MCG IC SOLR: INTRACAVERNOUS | Status: DC

## 2012-04-17 NOTE — Telephone Encounter (Signed)
SPOKE WITH [facility] PHARMACIST TO CORRECT DOSAGE ON MEDICATIONS AND CALLED PATIENT  OF INSTRUCTIONS AND REFILLS CORRECTED.

## 2012-05-03 ENCOUNTER — Other Ambulatory Visit: Payer: Self-pay | Admitting: Orthopedic Surgery

## 2012-05-03 DIAGNOSIS — M5136 Other intervertebral disc degeneration, lumbar region: Secondary | ICD-10-CM

## 2012-05-09 ENCOUNTER — Telehealth: Payer: Self-pay | Admitting: Internal Medicine

## 2012-05-09 NOTE — Telephone Encounter (Signed)
Caller: [name]/Patient; Patient Name: Troy Marsh; PCP: Illene Regulus (Adults only); [facility] Call[facility] Phone Number: [phone] Pt calling to give office a FYI that [facility] said they will be faxing office concerning Testosterone and Caverject prescriptions. They said prescriptions have expired. Pt said he is unaware of this.

## 2012-05-10 ENCOUNTER — Telehealth: Payer: Self-pay | Admitting: Internal Medicine

## 2012-05-10 MED ORDER — ALPROSTADIL (VASODILATOR) 20 MCG IC SOLR: INTRACAVERNOUS | Status: DC

## 2012-05-10 MED ORDER — TESTOSTERONE ENANTHATE 200 MG/ML IM SOLN: 200.0000 mg | INTRAMUSCULAR | Status: DC

## 2012-05-10 NOTE — Telephone Encounter (Signed)
Left message on home # of request from [facility] concerning medications. I called [facility] about Rx,s and was told patient had them transferred to his local pharmcay. Called patient to see if he got his medication.

## 2012-05-10 NOTE — Telephone Encounter (Signed)
Sent Rx to [facility] as patient requested.

## 2012-05-10 NOTE — Telephone Encounter (Signed)
Caller: Powell/Patient; Patient Name: Troy Marsh; PCP: Illene Regulus (Adults only); [facility] Call[facility] Phone Number: [phone] Zacharia is returning a call from the office.  States he had [facility] but would now like them to go back to [facility].  He did however pick up [facility] month supply of [facility].  He is requesting a 3 month supply of both for cost purposes to be sent to [facility].  [facility] fax number:  [phone], Phone number:  [phone].  Previous encounter had been closed from 10/24.

## 2012-05-15 ENCOUNTER — Telehealth: Payer: Self-pay | Admitting: Internal Medicine

## 2012-05-16 ENCOUNTER — Other Ambulatory Visit: Payer: Self-pay | Admitting: Internal Medicine

## 2012-05-27 ENCOUNTER — Telehealth: Payer: Self-pay | Admitting: Internal Medicine

## 2012-05-27 NOTE — Telephone Encounter (Signed)
Rx sent 10/25, pt advised of same via VM

## 2012-05-27 NOTE — Telephone Encounter (Signed)
Patient calling about his scripts for [facility].  They are telling him that they requested refills October 31.  Same is for the [employer].

## 2012-05-30 ENCOUNTER — Other Ambulatory Visit: Payer: Self-pay | Admitting: *Deleted

## 2012-05-30 MED ORDER — ALPROSTADIL (VASODILATOR) 20 MCG IC SOLR: INTRACAVERNOUS | Status: DC

## 2012-06-03 ENCOUNTER — Ambulatory Visit
Admission: RE | Admit: 2012-06-03 | Discharge: 2012-06-03 | Disposition: A | Discharge status: Home / Self Care | Payer: 59 | Source: Ambulatory Visit | Attending: Orthopedic Surgery | Admitting: Orthopedic Surgery

## 2012-06-03 VITALS — BP 120/88 | HR 76

## 2012-06-03 DIAGNOSIS — M5136 Other intervertebral disc degeneration, lumbar region: Secondary | ICD-10-CM

## 2012-06-03 MED ORDER — IOHEXOL 180 MG/ML  SOLN
1.0000 mL | Freq: Once | INTRAMUSCULAR | Status: AC | PRN
  Administered 2012-06-03: 1 mL via EPIDURAL

## 2012-06-03 MED ORDER — METHYLPREDNISOLONE ACETATE 40 MG/ML INJ SUSP (RADIOLOG
120.0000 mg | Freq: Once | INTRAMUSCULAR | Status: AC
  Administered 2012-06-03: 120 mg via EPIDURAL

## 2012-06-21 ENCOUNTER — Other Ambulatory Visit: Payer: Self-pay | Admitting: *Deleted

## 2012-06-21 MED ORDER — "INSULIN SYRINGE 29G X 1/2"" 1 ML MISC": 1.0000 | Status: DC

## 2012-07-24 ENCOUNTER — Other Ambulatory Visit: Payer: Self-pay | Admitting: Orthopedic Surgery

## 2012-07-24 DIAGNOSIS — M5136 Other intervertebral disc degeneration, lumbar region: Secondary | ICD-10-CM

## 2012-07-30 ENCOUNTER — Ambulatory Visit
Admission: RE | Admit: 2012-07-30 | Discharge: 2012-07-30 | Disposition: A | Discharge status: Home / Self Care | Payer: 59 | Source: Ambulatory Visit | Attending: Orthopedic Surgery | Admitting: Orthopedic Surgery

## 2012-07-30 VITALS — BP 135/78 | HR 61

## 2012-07-30 DIAGNOSIS — M5136 Other intervertebral disc degeneration, lumbar region: Secondary | ICD-10-CM

## 2012-07-30 MED ORDER — IOHEXOL 180 MG/ML  SOLN
1.0000 mL | Freq: Once | INTRAMUSCULAR | Status: AC | PRN
  Administered 2012-07-30: 1 mL via EPIDURAL

## 2012-07-30 MED ORDER — METHYLPREDNISOLONE ACETATE 40 MG/ML INJ SUSP (RADIOLOG
120.0000 mg | Freq: Once | INTRAMUSCULAR | Status: AC
  Administered 2012-07-30: 120 mg via EPIDURAL

## 2012-09-02 ENCOUNTER — Telehealth: Payer: Self-pay | Admitting: Internal Medicine

## 2012-09-02 MED ORDER — TESTOSTERONE ENANTHATE 200 MG/ML IM SOLN: 200.0000 mg | INTRAMUSCULAR | Status: DC

## 2012-09-02 NOTE — Telephone Encounter (Signed)
It is ok for refills through Sept '14

## 2012-09-02 NOTE — Telephone Encounter (Signed)
Called regarding refill of Testosterone injection; used every 2 weeks.  Reports [facility] faxed request to office week of 08/26/12 and told patient they have not heard anything back from office.  Had well visit 03/26/12; refills are supposed to be for one year. Please follow up with [facility] at [phone] ASAP and call patient to advise what has been done.

## 2012-09-02 NOTE — Telephone Encounter (Signed)
Rx printed and faxed to pharmacy. 

## 2012-09-04 ENCOUNTER — Telehealth: Payer: Self-pay | Admitting: Internal Medicine

## 2012-09-04 NOTE — Telephone Encounter (Signed)
Rx re-faxed to [facility], [phone]

## 2012-09-04 NOTE — Telephone Encounter (Signed)
Patient calling, [facility] told him this morning that they have not received the script for the Testosterone Injection.  Per medication list, same was sent on 09/02/12.  Can you please resend?

## 2012-09-13 ENCOUNTER — Telehealth: Payer: Self-pay | Admitting: Internal Medicine

## 2012-09-13 NOTE — Telephone Encounter (Signed)
Caller: Troy Marsh/Patient; Phone: [phone]; Reason for Call: Pt calling this AM 09/13/12 [facility] called him this AM regarding they sent a refill request regarding Testerone.  Said they need to speak with MD about the dosage.  Pt is not really sure.  PLEASE CALL PT AT [phone].  Thanks.

## 2012-09-19 ENCOUNTER — Other Ambulatory Visit: Payer: Self-pay | Admitting: Orthopedic Surgery

## 2012-09-19 DIAGNOSIS — M549 Dorsalgia, unspecified: Secondary | ICD-10-CM

## 2012-09-25 ENCOUNTER — Other Ambulatory Visit: Payer: Self-pay

## 2012-09-25 MED ORDER — "SYRINGE/NEEDLE (DISP) 23G X 1-1/2"" 3 ML MISC": Status: DC

## 2012-09-30 LAB — BASIC METABOLIC PANEL
BUN: 15 mg/dL (ref 4–21)
Glucose: 100 mg/dL
Potassium: 3.6 mmol/L (ref 3.4–5.3)
Sodium: 141 mmol/L (ref 137–147)

## 2012-09-30 LAB — HEPATIC FUNCTION PANEL
AST: 33 U/L (ref 14–40)
Bilirubin, Total: 0.5 mg/dL

## 2012-09-30 LAB — LIPID PANEL: LDL Cholesterol: 97 mg/dL

## 2012-09-30 LAB — CBC AND DIFFERENTIAL: WBC: 4.7 10^3/mL

## 2012-10-01 ENCOUNTER — Ambulatory Visit
Admission: RE | Admit: 2012-10-01 | Discharge: 2012-10-01 | Disposition: A | Discharge status: Home / Self Care | Payer: 59 | Source: Ambulatory Visit | Attending: Orthopedic Surgery | Admitting: Orthopedic Surgery

## 2012-10-01 VITALS — BP 150/89 | HR 67

## 2012-10-01 DIAGNOSIS — M549 Dorsalgia, unspecified: Secondary | ICD-10-CM

## 2012-10-01 MED ORDER — METHYLPREDNISOLONE ACETATE 40 MG/ML INJ SUSP (RADIOLOG
120.0000 mg | Freq: Once | INTRAMUSCULAR | Status: AC
  Administered 2012-10-01: 120 mg via EPIDURAL

## 2012-10-01 MED ORDER — IOHEXOL 180 MG/ML  SOLN
1.0000 mL | Freq: Once | INTRAMUSCULAR | Status: AC | PRN
  Administered 2012-10-01: 1 mL via EPIDURAL

## 2012-10-23 ENCOUNTER — Other Ambulatory Visit: Payer: Self-pay | Admitting: Internal Medicine

## 2012-11-05 ENCOUNTER — Encounter: Payer: 59 | Admitting: Internal Medicine

## 2012-11-13 ENCOUNTER — Other Ambulatory Visit: Payer: Self-pay | Admitting: Orthopedic Surgery

## 2012-11-13 DIAGNOSIS — M545 Low back pain, unspecified: Secondary | ICD-10-CM

## 2012-11-22 ENCOUNTER — Other Ambulatory Visit: Payer: Self-pay | Admitting: Internal Medicine

## 2012-12-02 ENCOUNTER — Telehealth: Payer: Self-pay | Admitting: Internal Medicine

## 2012-12-02 NOTE — Telephone Encounter (Signed)
He needs an appt 

## 2012-12-02 NOTE — Telephone Encounter (Signed)
Pt scheduled 12/04/12 @ 8:45

## 2012-12-02 NOTE — Telephone Encounter (Signed)
yes

## 2012-12-02 NOTE — Telephone Encounter (Signed)
Pt has a CPE scheduled 03/27/13 with Dr. Debby Bud, does he needs an OV to follow up before then?

## 2012-12-02 NOTE — Telephone Encounter (Signed)
Patient requesting a refill on testosterone be sent to [facility]

## 2012-12-04 ENCOUNTER — Ambulatory Visit (INDEPENDENT_AMBULATORY_CARE_PROVIDER_SITE_OTHER): Payer: 59 | Admitting: Internal Medicine

## 2012-12-04 ENCOUNTER — Encounter: Payer: Self-pay | Admitting: Internal Medicine

## 2012-12-04 VITALS — BP 102/72 | HR 68 | Temp 97.4°F | Wt 144.8 lb

## 2012-12-04 DIAGNOSIS — B20 Human immunodeficiency virus [HIV] disease: Secondary | ICD-10-CM

## 2012-12-04 DIAGNOSIS — E291 Testicular hypofunction: Secondary | ICD-10-CM

## 2012-12-04 MED ORDER — TESTOSTERONE ENANTHATE 200 MG/ML IM SOLN: 200.0000 mg | INTRAMUSCULAR | Status: DC

## 2012-12-04 MED ORDER — "INSULIN SYRINGE 29G X 1/2"" 1 ML MISC": 1.0000 | Status: DC

## 2012-12-04 NOTE — Patient Instructions (Signed)
It was good to see you today. We have reviewed your prior records including labs and tests today Medications reviewed and updated, no changes recommended at this time. Refill on medication(s) as discussed today. Please keep schedules followup in Sept 2014 as planned for physical and labs, call sooner if problems.

## 2012-12-04 NOTE — Progress Notes (Signed)
  Subjective:    Patient ID: Troy Marsh, male    DOB: May 03, 1951, 62 y.o.   MRN: [id number]  HPI  Here for followup Reviewed chronic medical issues and medications Reviewed and updated labs from [facility] 09/2012 - scanned into EMR  Past Medical History  Diagnosis Date  . HIV disease     last CD4 10/02/12 was 277  . Hypertension   . Hypogonadism male     Review of Systems  Constitutional: Negative for fever and fatigue.  Respiratory: Negative for cough and shortness of breath.   Cardiovascular: Negative for chest pain and palpitations.  Genitourinary: Negative for hematuria, discharge, scrotal swelling, genital sores, penile pain and testicular pain.       Objective:   Physical Exam BP 102/72  Pulse 68  Temp(Src) 97.4 F (36.3 C) (Oral)  Wt 144 lb 12.8 oz (65.681 kg)  BMI 20.78 kg/m2  SpO2 98% Wt Readings from Last 3 Encounters:  12/04/12 144 lb 12.8 oz (65.681 kg)  03/26/12 150 lb 12.8 oz (68.402 kg)  02/10/11 150 lb 4 oz (68.153 kg)   Constitutional: he appears well-developed and well-nourished. No distress. very tan Cardiovascular: Normal rate, regular rhythm and normal heart sounds.  No murmur heard. No BLE edema. Pulmonary/Chest: Effort normal and breath sounds normal. No respiratory distress. he has no wheezes.  Psychiatric: he has a normal mood and affect. behavior is normal. Judgment and thought content normal.  Lab Results  Component Value Date   WBC 4.7 09/30/2012   HGB 14.8 09/30/2012   HCT 43 09/30/2012   PLT 194 09/30/2012   GLUCOSE 96 05/21/2006   CHOL 201* 09/30/2012   TRIG 98 09/30/2012   HDL 84* 09/30/2012   LDLDIRECT 113.3 05/21/2006   LDLCALC 97 09/30/2012   ALT 22 09/30/2012   AST 33 09/30/2012   NA 141 09/30/2012   K 3.6 09/30/2012   CL 99 05/21/2006   CREATININE 1.1 09/30/2012   BUN 15 09/30/2012   CO2 34* 05/21/2006   TSH 2.45 05/21/2006   PSA 0.61 05/21/2006        Assessment & Plan:   See problem list. Medications and labs reviewed  today.  Time spent with pt today 25 minutes, greater than 50% time spent counseling patient on hypogonadism, interval history including social stressors with partner's illness/fall and SNF rehab, mother's death 01-06-12 and medication review. Also review of [facility] ID labs/records

## 2012-12-04 NOTE — Assessment & Plan Note (Signed)
Patient reports that his caverject and q2week testosterone is working well for him and has no complaints.  refill on this medication today.  The current medical regimen is effective;  continue present plan and medications.

## 2012-12-04 NOTE — Assessment & Plan Note (Signed)
Patient's last CD4 count was 277 in March 2013. His HIV is well controled. He reports no changes in symptoms. PLAN - Have patient keep managing his HIV with Dr. Servando Snare at [facility] hosp ID department.

## 2012-12-27 ENCOUNTER — Ambulatory Visit
Admission: RE | Admit: 2012-12-27 | Discharge: 2012-12-27 | Disposition: A | Discharge status: Home / Self Care | Payer: 59 | Source: Ambulatory Visit | Attending: Orthopedic Surgery | Admitting: Orthopedic Surgery

## 2012-12-27 DIAGNOSIS — M545 Low back pain, unspecified: Secondary | ICD-10-CM

## 2012-12-27 MED ORDER — IOHEXOL 180 MG/ML  SOLN
1.0000 mL | Freq: Once | INTRAMUSCULAR | Status: AC | PRN
  Administered 2012-12-27: 1 mL via EPIDURAL

## 2012-12-27 MED ORDER — METHYLPREDNISOLONE ACETATE 40 MG/ML INJ SUSP (RADIOLOG
120.0000 mg | Freq: Once | INTRAMUSCULAR | Status: AC
  Administered 2012-12-27: 120 mg via EPIDURAL

## 2013-02-18 ENCOUNTER — Other Ambulatory Visit: Payer: Self-pay | Admitting: Orthopedic Surgery

## 2013-02-18 DIAGNOSIS — M549 Dorsalgia, unspecified: Secondary | ICD-10-CM

## 2013-02-21 ENCOUNTER — Ambulatory Visit
Admission: RE | Admit: 2013-02-21 | Discharge: 2013-02-21 | Disposition: A | Discharge status: Home / Self Care | Payer: 59 | Source: Ambulatory Visit | Attending: Orthopedic Surgery | Admitting: Orthopedic Surgery

## 2013-02-21 DIAGNOSIS — M549 Dorsalgia, unspecified: Secondary | ICD-10-CM

## 2013-02-21 MED ORDER — METHYLPREDNISOLONE ACETATE 40 MG/ML INJ SUSP (RADIOLOG
120.0000 mg | Freq: Once | INTRAMUSCULAR | Status: AC
  Administered 2013-02-21: 120 mg via EPIDURAL

## 2013-02-21 MED ORDER — IOHEXOL 180 MG/ML  SOLN
1.0000 mL | Freq: Once | INTRAMUSCULAR | Status: AC | PRN
  Administered 2013-02-21: 1 mL via EPIDURAL

## 2013-03-27 ENCOUNTER — Encounter: Payer: Self-pay | Admitting: Internal Medicine

## 2013-03-27 ENCOUNTER — Ambulatory Visit (INDEPENDENT_AMBULATORY_CARE_PROVIDER_SITE_OTHER): Payer: 59 | Admitting: Internal Medicine

## 2013-03-27 VITALS — BP 108/88 | HR 67 | Temp 96.8°F | Ht 70.0 in | Wt 146.0 lb

## 2013-03-27 DIAGNOSIS — M545 Low back pain, unspecified: Secondary | ICD-10-CM

## 2013-03-27 DIAGNOSIS — B20 Human immunodeficiency virus [HIV] disease: Secondary | ICD-10-CM

## 2013-03-27 DIAGNOSIS — E291 Testicular hypofunction: Secondary | ICD-10-CM

## 2013-03-27 DIAGNOSIS — G8929 Other chronic pain: Secondary | ICD-10-CM

## 2013-03-27 DIAGNOSIS — Z1211 Encounter for screening for malignant neoplasm of colon: Secondary | ICD-10-CM

## 2013-03-27 DIAGNOSIS — Z0000 Encounter for general adult medical examination without abnormal findings: Secondary | ICD-10-CM

## 2013-03-27 NOTE — Progress Notes (Signed)
Subjective:    Patient ID: Troy Marsh, male    DOB: 1950/11/13, 62 y.o.   MRN: [id number]  HPI Troy Marsh presents for an annual medical exam. 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 His mother passed in June '13, in SNF she broke her hip and she passed October /13. He also lost his Uncle. Troy Marsh has had good health - chronic back pain attended by Dr. Charlett Blake - [facility] Mink. He is to see Dr. Servando Snare. Reviewed all labs in Care Everywhere from April 17, '14 - no need for repeat lab - he will get this done at his next visit with Dr. Servando Snare.  He has been to the dentist. He will see eye doctor in October'14. Healthy diet. Exercise - walks 5 miles a day.  Past Medical History  Diagnosis Date  . HIV disease     last CD4 10/02/12 was 277  . Hypertension   . Hypogonadism male    Past Surgical History  Procedure Laterality Date  . Neck surgery    . Hemorrhoid surgery     Family History  Problem Relation Age of Onset  . Coronary artery disease Father   . Heart attack Father   . Hypertension Father   . Hyperlipidemia Father   . Cancer Neg Hx     prostate and colon  . Diabetes Neg Hx    History   Social History  . Marital Status: Single    Spouse Name: Fredrik Cove (partner)    Number of Children: N/A  . Years of Education: 16   Occupational History  . retired     Printmaker work   Social History Main Topics  . Smoking status: Former Smoker -- 2.00 packs/day for 35 years    Types: Cigarettes    Quit date: 02/10/2004  . Smokeless tobacco: Never Used  . Alcohol Use: No     Comment: quit 2004  . Drug Use: No  . Sexual Activity: Yes    Partners: Male    Birth Control/ Protection: Condom     Comment: monogamous w/ single male partner 41   Other Topics Concern  . Not on file   Social History Narrative   [employer]. Work - [place] [employer] - retired 2009. Long term monogamous  relationship. Lives in his own home.             Current Outpatient Prescriptions on File Prior to Visit  Medication Sig Dispense Refill  . Alprostadil, Vasodilator, (CAVERJECT) 20 MCG SOLR USE CAVERJECT 20 MCG IMPULSE BOX, EACH BOX 2 INJECTIONS, USE AS DIRECTED  36 each  3  . aspirin 81 MG tablet Take 81 mg by mouth daily.        . B-D 3CC LUER-LOK SYR 23GX1-1/2 23G X 1-1/2" 3 ML MISC USE 1 SYRINGE EVERY 2     WEEKS AS DIRECTED WITH    TESTOSTERONE  6 each  10  . CIALIS 5 MG tablet TAKE 1 TABLET EVERY DAY  30 tablet  0  . efavirenz-emtrictabine-tenofovir (ATRIPLA) 600-200-300 MG per tablet Take 1 tablet by mouth at bedtime.        . hydrochlorothiazide 50 MG tablet Take 50 mg by mouth daily.        . INSULIN SYRINGE 1CC/29G (SAFETY-LOK INSULIN SYR 1CC/29G) 29G X 1/2" 1 ML MISC Inject 1 Syringe into the skin 2 (two) times a week.  10 each  3  . Melatonin 5 MG TABS Take by mouth.        . multivitamin (THERAGRAN) per tablet Take 1 tablet by mouth daily.        . NON FORMULARY Bacterio-static water- use with Caverject       . testosterone enanthate (DELATESTRYL) 200 MG/ML injection Inject 1 mL (200 mg total) into the muscle every 14 (fourteen) days.  10 mL  2  . Zinc 50 MG TABS Take 1 tablet by mouth daily.         No current facility-administered medications on file prior to visit.       Review of Systems Constitutional:  Negative for fever, chills, activity change and unexpected weight change.  HEENT:  Negative for hearing loss, ear pain, congestion, neck stiffness and postnasal drip. Negative for sore throat or swallowing problems. Negative for dental complaints.   Eyes: Negative for vision loss or change in visual acuity.  Respiratory: Negative for chest tightness and wheezing. Negative for DOE.   Cardiovascular: Negative for chest pain or palpitations. No decreased exercise tolerance Gastrointestinal: No change in bowel habit. No bloating or gas. No reflux or  indigestion Genitourinary: Negative for urgency, frequency, flank pain and difficulty urinating.  Musculoskeletal: Negative for myalgias, back pain, arthralgias and gait problem.  Neurological: Negative for dizziness, tremors, weakness and headaches.  Hematological: Negative for adenopathy.  Psychiatric/Behavioral: Negative for behavioral problems and dysphoric mood.       Objective:   Physical Exam Filed Vitals:   03/27/13 0858  BP: 108/88  Pulse: 67  Temp: 96.8 F (36 C)   Wt Readings from Last 3 Encounters:  03/27/13 146 lb (66.225 kg)  12/04/12 144 lb 12.8 oz (65.681 kg)  03/26/12 150 lb 12.8 oz (68.402 kg)   Gen'l: Well nourished well developed male in no acute distress  HEENT: Head: Normocephalic and atraumatic. Right Ear: External ear normal. EAC/TM nl. Left Ear: External ear normal.  EAC/TM nl. Nose: Nose normal. Mouth/Throat: Oropharynx is clear and moist. Dentition - native, in good repair. No buccal or palatal lesions. Posterior pharynx clear. Eyes: Conjunctivae and sclera clear. EOM intact. Pupils are equal, round, and reactive to light. Right eye exhibits no discharge. Left eye exhibits no discharge. Neck: Normal range of motion. Neck supple. No JVD present. No tracheal deviation present. No thyromegaly present.  Cardiovascular: Normal rate, regular rhythm, no gallop, no friction rub, no murmur heard.      Quiet precordium. 2+ radial and DP pulses . No carotid bruits Pulmonary/Chest: Effort normal. No respiratory distress or increased WOB, no wheezes, no rales. No chest wall deformity or CVAT. Abdomen: Soft. Bowel sounds are normal in all quadrants. He exhibits no distension, no tenderness, no rebound or guarding, No heptosplenomegaly  Genitourinary:  deferred Musculoskeletal: Normal range of motion. He exhibits no edema and no tenderness.       Small and large joints without redness, synovial thickening or deformity. Full range of motion preserved about all small, median  and large joints.  Lymphadenopathy:    He has no cervical or supraclavicular adenopathy.  Neurological: He is alert and oriented to person, place, and time. CN II-XII intact. DTRs 2+ and symmetrical biceps, radial and patellar tendons. Cerebellar function normal with no tremor, rigidity, normal gait and station.  Skin: Skin is warm and dry. No rash noted. No erythema. Very dry skin. Abrasion on the right shin. Psychiatric: He has a normal mood and affect. His behavior is normal. Thought content normal.  Assessment & Plan:

## 2013-03-27 NOTE — Assessment & Plan Note (Signed)
Interval history is unremarkable. Physical exam is normal. He is due for routine follow up colorectal cancer screening - GI referral is placed. Immunizations are current.  In summary - a very nice man who is medically stable. He will return 1 year or prn.
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
## 2013-03-27 NOTE — Assessment & Plan Note (Signed)
Continues to do well. Will be following up with Dr. Servando Snare in October

## 2013-03-27 NOTE — Assessment & Plan Note (Signed)
No change in condition. Continues to use norco and to follow with Dr. Charlett Blake.

## 2013-03-27 NOTE — Assessment & Plan Note (Signed)
Refills for testosterone replacement are current. PSA is normal

## 2013-04-28 DIAGNOSIS — I10 Essential (primary) hypertension: Secondary | ICD-10-CM | POA: Insufficient documentation

## 2013-05-02 ENCOUNTER — Encounter: Payer: Self-pay | Admitting: Internal Medicine

## 2013-05-05 ENCOUNTER — Telehealth: Payer: Self-pay | Admitting: Internal Medicine

## 2013-05-05 ENCOUNTER — Other Ambulatory Visit: Payer: Self-pay | Admitting: Orthopedic Surgery

## 2013-05-05 DIAGNOSIS — M545 Low back pain, unspecified: Secondary | ICD-10-CM

## 2013-05-05 MED ORDER — TESTOSTERONE ENANTHATE 200 MG/ML IM SOLN: 200.0000 mg | INTRAMUSCULAR | Status: DC

## 2013-05-05 NOTE — Telephone Encounter (Signed)
rx has been sent to [facility]

## 2013-05-05 NOTE — Telephone Encounter (Signed)
The patient called the triage line hoping to get refills of his testosterone injections sent to [facility].   Callback - [phone]  Thanks!

## 2013-05-05 NOTE — Telephone Encounter (Signed)
Ok for refills on testosterone

## 2013-05-09 ENCOUNTER — Ambulatory Visit
Admission: RE | Admit: 2013-05-09 | Discharge: 2013-05-09 | Disposition: A | Discharge status: Home / Self Care | Payer: 59 | Source: Ambulatory Visit | Attending: Orthopedic Surgery | Admitting: Orthopedic Surgery

## 2013-05-09 DIAGNOSIS — M545 Low back pain, unspecified: Secondary | ICD-10-CM

## 2013-05-09 MED ORDER — IOHEXOL 180 MG/ML  SOLN
1.0000 mL | Freq: Once | INTRAMUSCULAR | Status: AC | PRN
  Administered 2013-05-09: 1 mL via EPIDURAL

## 2013-05-09 MED ORDER — METHYLPREDNISOLONE ACETATE 40 MG/ML INJ SUSP (RADIOLOG
120.0000 mg | Freq: Once | INTRAMUSCULAR | Status: AC
  Administered 2013-05-09: 120 mg via EPIDURAL

## 2013-05-14 ENCOUNTER — Encounter: Payer: Self-pay | Admitting: Internal Medicine

## 2013-05-28 ENCOUNTER — Other Ambulatory Visit: Payer: Self-pay

## 2013-05-28 MED ORDER — ALPROSTADIL (VASODILATOR) 20 MCG IC SOLR: INTRACAVERNOUS | Status: DC

## 2013-06-26 ENCOUNTER — Other Ambulatory Visit: Payer: Self-pay | Admitting: Orthopedic Surgery

## 2013-06-26 DIAGNOSIS — M545 Low back pain, unspecified: Secondary | ICD-10-CM

## 2013-06-27 ENCOUNTER — Other Ambulatory Visit: Payer: Self-pay | Admitting: Internal Medicine

## 2013-07-03 ENCOUNTER — Ambulatory Visit
Admission: RE | Admit: 2013-07-03 | Discharge: 2013-07-03 | Disposition: A | Discharge status: Home / Self Care | Payer: 59 | Source: Ambulatory Visit | Attending: Orthopedic Surgery | Admitting: Orthopedic Surgery

## 2013-07-03 VITALS — BP 152/90 | HR 67

## 2013-07-03 DIAGNOSIS — M545 Low back pain, unspecified: Secondary | ICD-10-CM

## 2013-07-03 MED ORDER — METHYLPREDNISOLONE ACETATE 40 MG/ML INJ SUSP (RADIOLOG
120.0000 mg | Freq: Once | INTRAMUSCULAR | Status: AC
  Administered 2013-07-03: 120 mg via EPIDURAL

## 2013-07-03 MED ORDER — IOHEXOL 180 MG/ML  SOLN
1.0000 mL | Freq: Once | INTRAMUSCULAR | Status: AC | PRN
  Administered 2013-07-03: 1 mL via EPIDURAL

## 2013-07-18 ENCOUNTER — Telehealth: Payer: Self-pay

## 2013-07-18 NOTE — Telephone Encounter (Signed)
Phone call from Santiago Glad at [facility] [phone] needing clarification on directions and quantity for patient's Caverject 20 mcg. On 05/28/13 a script was sent to [facility] for qty 36 plus 3 refills sig: use caverject 20 mcg impulse box, each box 2 injections, use as directed. On 06/27/13 a script was sent for qty 6 plus 22 refills sig: Inject 20 mcg daily. [facility] needs clarification on qty ,# of refills, and instructions. Please advise.

## 2013-07-21 NOTE — Telephone Encounter (Signed)
Patient states the number of doses per month is 6.  I called  [facility] back and let them know the script for 06/27/13 is the one to use.   Dr Linda Hedges patient requests a refill on testosterone. Okay to refill?

## 2013-07-21 NOTE — Telephone Encounter (Signed)
He uses Caverject 20 mcg injection prn. Please call patient to clarify number of doses per month.  Thanks

## 2013-07-24 MED ORDER — TESTOSTERONE ENANTHATE 200 MG/ML IM SOLN: 200.0000 mg | INTRAMUSCULAR | Status: DC

## 2013-07-24 NOTE — Telephone Encounter (Signed)
Returned call to patient letting him know we have not gotten a response back from Dr Linda Hedges yet.

## 2013-07-24 NOTE — Telephone Encounter (Signed)
Pt called to follow up on the request. Please advise

## 2013-07-24 NOTE — Telephone Encounter (Signed)
Script printed and faxed to [facility]

## 2013-07-24 NOTE — Telephone Encounter (Signed)
Yes it is fine to refill testosterone

## 2013-08-29 ENCOUNTER — Telehealth: Payer: Self-pay | Admitting: *Deleted

## 2013-08-29 NOTE — Telephone Encounter (Signed)
Patient phoned requesting that his testosterone vial be changed from 5mg  vial to 10 mg vial b/c it is cheaper.  Last OV with PCP 03/27/13.  Please advise--patient thought that this had already been resolved.  CB# [phone]

## 2013-08-29 NOTE — Telephone Encounter (Signed)
Ok with me to change to 10 ml vial. May be an insurance issue. OK to submit new order with 10 cc vial, same instructions.

## 2013-09-03 MED ORDER — TESTOSTERONE ENANTHATE 200 MG/ML IM SOLN: 200.0000 mg | INTRAMUSCULAR | Status: DC

## 2013-09-03 NOTE — Telephone Encounter (Signed)
Script printed and awaiting MD sig

## 2013-09-03 NOTE — Telephone Encounter (Signed)
Notified patient of MD response and that requested script would be faxed to his pharmacy today.

## 2013-09-23 ENCOUNTER — Other Ambulatory Visit: Payer: Self-pay | Admitting: Orthopedic Surgery

## 2013-09-23 DIAGNOSIS — M549 Dorsalgia, unspecified: Secondary | ICD-10-CM

## 2013-10-03 ENCOUNTER — Ambulatory Visit
Admission: RE | Admit: 2013-10-03 | Discharge: 2013-10-03 | Disposition: A | Discharge status: Home / Self Care | Payer: 59 | Source: Ambulatory Visit | Attending: Orthopedic Surgery | Admitting: Orthopedic Surgery

## 2013-10-03 VITALS — BP 142/85 | HR 84

## 2013-10-03 DIAGNOSIS — M549 Dorsalgia, unspecified: Secondary | ICD-10-CM

## 2013-10-03 MED ORDER — METHYLPREDNISOLONE ACETATE 40 MG/ML INJ SUSP (RADIOLOG
120.0000 mg | Freq: Once | INTRAMUSCULAR | Status: AC
  Administered 2013-10-03: 120 mg via EPIDURAL

## 2013-10-03 MED ORDER — IOHEXOL 180 MG/ML  SOLN
1.0000 mL | Freq: Once | INTRAMUSCULAR | Status: AC | PRN
  Administered 2013-10-03: 1 mL via EPIDURAL

## 2013-11-05 ENCOUNTER — Other Ambulatory Visit: Payer: Self-pay | Admitting: Orthopedic Surgery

## 2013-11-05 DIAGNOSIS — M549 Dorsalgia, unspecified: Secondary | ICD-10-CM

## 2013-12-04 ENCOUNTER — Other Ambulatory Visit: Payer: 59

## 2013-12-12 ENCOUNTER — Ambulatory Visit
Admission: RE | Admit: 2013-12-12 | Discharge: 2013-12-12 | Disposition: A | Discharge status: Home / Self Care | Payer: 59 | Source: Ambulatory Visit | Attending: Orthopedic Surgery | Admitting: Orthopedic Surgery

## 2013-12-12 VITALS — BP 149/77 | HR 60

## 2013-12-12 DIAGNOSIS — M549 Dorsalgia, unspecified: Secondary | ICD-10-CM

## 2013-12-12 MED ORDER — METHYLPREDNISOLONE ACETATE 40 MG/ML INJ SUSP (RADIOLOG
120.0000 mg | Freq: Once | INTRAMUSCULAR | Status: AC
  Administered 2013-12-12: 120 mg via EPIDURAL

## 2013-12-12 MED ORDER — IOHEXOL 180 MG/ML  SOLN
1.0000 mL | Freq: Once | INTRAMUSCULAR | Status: AC | PRN
  Administered 2013-12-12: 1 mL via EPIDURAL

## 2013-12-25 ENCOUNTER — Encounter: Payer: Self-pay | Admitting: Gastroenterology

## 2014-01-19 ENCOUNTER — Other Ambulatory Visit: Payer: Self-pay | Admitting: Orthopedic Surgery

## 2014-01-19 DIAGNOSIS — M5442 Lumbago with sciatica, left side: Secondary | ICD-10-CM

## 2014-01-29 ENCOUNTER — Ambulatory Visit
Admission: RE | Admit: 2014-01-29 | Discharge: 2014-01-29 | Disposition: A | Discharge status: Home / Self Care | Payer: 59 | Source: Ambulatory Visit | Attending: Orthopedic Surgery | Admitting: Orthopedic Surgery

## 2014-01-29 DIAGNOSIS — M5442 Lumbago with sciatica, left side: Secondary | ICD-10-CM

## 2014-01-29 MED ORDER — IOHEXOL 180 MG/ML  SOLN
1.0000 mL | Freq: Once | INTRAMUSCULAR | Status: AC | PRN
  Administered 2014-01-29: 1 mL via EPIDURAL

## 2014-01-29 MED ORDER — METHYLPREDNISOLONE ACETATE 40 MG/ML INJ SUSP (RADIOLOG
120.0000 mg | Freq: Once | INTRAMUSCULAR | Status: AC
  Administered 2014-01-29: 120 mg via EPIDURAL

## 2014-02-04 ENCOUNTER — Ambulatory Visit (AMBULATORY_SURGERY_CENTER): Payer: Self-pay | Admitting: *Deleted

## 2014-02-04 VITALS — Ht 70.0 in | Wt 154.8 lb

## 2014-02-04 DIAGNOSIS — Z1211 Encounter for screening for malignant neoplasm of colon: Secondary | ICD-10-CM

## 2014-02-04 MED ORDER — MOVIPREP 100 G PO SOLR: ORAL | Status: DC

## 2014-02-04 NOTE — Progress Notes (Signed)
No allergies to eggs or soy. No problems with anesthesia.  Pt given Emmi instructions for colonoscopy  No oxygen use  No diet drug use  

## 2014-02-13 ENCOUNTER — Encounter: Payer: Self-pay | Admitting: Internal Medicine

## 2014-02-16 ENCOUNTER — Encounter: Payer: 59 | Admitting: Gastroenterology

## 2014-03-26 ENCOUNTER — Other Ambulatory Visit: Payer: Self-pay | Admitting: Orthopedic Surgery

## 2014-03-26 DIAGNOSIS — M545 Low back pain, unspecified: Secondary | ICD-10-CM

## 2014-03-26 DIAGNOSIS — G8929 Other chronic pain: Secondary | ICD-10-CM

## 2014-03-30 ENCOUNTER — Ambulatory Visit (INDEPENDENT_AMBULATORY_CARE_PROVIDER_SITE_OTHER): Payer: 59 | Admitting: Internal Medicine

## 2014-03-30 ENCOUNTER — Encounter: Payer: Self-pay | Admitting: Internal Medicine

## 2014-03-30 ENCOUNTER — Telehealth: Payer: Self-pay | Admitting: Internal Medicine

## 2014-03-30 VITALS — BP 120/90 | HR 68 | Temp 97.9°F | Ht 70.0 in | Wt 155.0 lb

## 2014-03-30 DIAGNOSIS — Z23 Encounter for immunization: Secondary | ICD-10-CM

## 2014-03-30 DIAGNOSIS — M545 Low back pain, unspecified: Secondary | ICD-10-CM

## 2014-03-30 DIAGNOSIS — E291 Testicular hypofunction: Secondary | ICD-10-CM

## 2014-03-30 DIAGNOSIS — G8929 Other chronic pain: Secondary | ICD-10-CM

## 2014-03-30 DIAGNOSIS — C4490 Unspecified malignant neoplasm of skin, unspecified: Secondary | ICD-10-CM

## 2014-03-30 DIAGNOSIS — Z0000 Encounter for general adult medical examination without abnormal findings: Secondary | ICD-10-CM

## 2014-03-30 DIAGNOSIS — B20 Human immunodeficiency virus [HIV] disease: Secondary | ICD-10-CM

## 2014-03-30 MED ORDER — TESTOSTERONE ENANTHATE 200 MG/ML IM SOLN: 200.0000 mg | INTRAMUSCULAR | Status: DC

## 2014-03-30 MED ORDER — ALPROSTADIL (VASODILATOR) 20 MCG IC SOLR: INTRACAVERNOUS | Status: DC

## 2014-03-30 MED ORDER — "SYRINGE/NEEDLE (DISP) 23G X 1-1/2"" 3 ML MISC": Status: DC

## 2014-03-30 MED ORDER — CELECOXIB 200 MG PO CAPS: 200.0000 mg | ORAL_CAPSULE | Freq: Two times a day (BID) | ORAL | Status: DC

## 2014-03-30 MED ORDER — TADALAFIL 5 MG PO TABS: ORAL_TABLET | ORAL | Status: DC

## 2014-03-30 MED ORDER — HYDROCHLOROTHIAZIDE 50 MG PO TABS: 50.0000 mg | ORAL_TABLET | Freq: Every day | ORAL | Status: DC

## 2014-03-30 MED ORDER — "INSULIN SYRINGE 29G X 1/2"" 1 ML MISC": 1.0000 | Status: DC

## 2014-03-30 NOTE — Progress Notes (Signed)
   Subjective:    Patient ID: Troy Marsh, male    DOB: 06-16-1951, 63 y.o.   MRN: [id number]  HPI  The patient is here for a wellness exam. The patient has been doing well overall without major physical or psychological issues going on lately. The patient needs to address  chronic hypertension that has been well controlled with medicines; to address chronic HIV, hyperlipidemia controlled with medicines as well  Review of Systems  Constitutional: Negative for appetite change, fatigue and unexpected weight change.  HENT: Negative for congestion, ear pain, nosebleeds, sneezing, sore throat and trouble swallowing.   Eyes: Negative for itching and visual disturbance.  Respiratory: Negative for cough.   Cardiovascular: Negative for chest pain, palpitations and leg swelling.  Gastrointestinal: Negative for nausea, diarrhea, blood in stool and abdominal distention.  Genitourinary: Negative for frequency and hematuria.  Musculoskeletal: Negative for back pain, gait problem, joint swelling and neck pain.  Skin: Negative for rash and wound.  Neurological: Negative for dizziness, tremors, speech difficulty and weakness.  Psychiatric/Behavioral: Negative for suicidal ideas, sleep disturbance, dysphoric mood and agitation. The patient is not nervous/anxious.        Objective:   Physical Exam  Constitutional: He is oriented to person, place, and time. He appears well-developed and well-nourished. No distress.  HENT:  Head: Normocephalic and atraumatic.  Right Ear: External ear normal.  Left Ear: External ear normal.  Nose: Nose normal.  Mouth/Throat: Oropharynx is clear and moist. No oropharyngeal exudate.  Eyes: Conjunctivae and EOM are normal. Pupils are equal, round, and reactive to light. Right eye exhibits no discharge. Left eye exhibits no discharge. No scleral icterus.  Neck: Normal range of motion. Neck supple. No JVD present. No tracheal deviation present. No thyromegaly present.    Cardiovascular: Normal rate, regular rhythm, normal heart sounds and intact distal pulses.  Exam reveals no gallop and no friction rub.   No murmur heard. Pulmonary/Chest: Effort normal and breath sounds normal. No stridor. No respiratory distress. He has no wheezes. He has no rales. He exhibits no tenderness.  Abdominal: Soft. Bowel sounds are normal. He exhibits no distension and no mass. There is no tenderness. There is no rebound and no guarding.  Genitourinary: Rectum normal, prostate normal and penis normal. Guaiac negative stool. No penile tenderness.  Musculoskeletal: Normal range of motion. He exhibits no edema and no tenderness.  Lymphadenopathy:    He has no cervical adenopathy.  Neurological: He is alert and oriented to person, place, and time. He has normal reflexes. No cranial nerve deficit. He exhibits normal muscle tone. Coordination normal.  Skin: Skin is warm and dry. No rash noted. He is not diaphoretic. No erythema. No pallor.  Psychiatric: He has a normal mood and affect. His behavior is normal. Judgment and thought content normal.  B nipples w/hypertrophy Prostate WNL, stool G(-) Large deep granulating ulcer on R anterior distal shin        Assessment & Plan:

## 2014-03-30 NOTE — Assessment & Plan Note (Signed)
The patient is here for a wellness exam. The patient has been doing well overall without major physical or psychological issues going on lately. The patient needs to address  chronic hypertension that has been well controlled with medicines; to address chronic  hyperlipidemia controlled with medicines as well; and to address type 2 chronic diabetes, controlled with medical treatment and diet. colonoscopy Jan '03 Immunizations: Tetanus June '10; Shingles vaccine Jan '11

## 2014-03-30 NOTE — Telephone Encounter (Signed)
Patient called stating he needs all his medications sent to [facility] for 90 day supply. He does not want to use [facility] for the medications he takes daily. He would like [place] to send the 2 rx we sent to [facility] today to [facility] instead.

## 2014-03-30 NOTE — Progress Notes (Signed)
Pre visit review using our clinic review tool, if applicable. No additional management support is needed unless otherwise documented below in the visit note. 

## 2014-03-30 NOTE — Assessment & Plan Note (Signed)
Testosterone replacment q 2 weeks IM and using caverject  Potential benefits of a long term testosterone use as well as potential risks  and complications were explained to the patient and were aknowledged. 

## 2014-03-30 NOTE — Telephone Encounter (Signed)
Called pt no answer LMOM rx's has been [facility]...[name]

## 2014-04-01 ENCOUNTER — Other Ambulatory Visit: Payer: Self-pay

## 2014-04-01 ENCOUNTER — Telehealth: Payer: Self-pay | Admitting: *Deleted

## 2014-04-01 DIAGNOSIS — C4490 Unspecified malignant neoplasm of skin, unspecified: Secondary | ICD-10-CM | POA: Insufficient documentation

## 2014-04-01 NOTE — Telephone Encounter (Signed)
Left msg on triage stating received script for testosterone enanthate. They do not have in stock wanting to see can they swith to the depo-testosterone injection instead...Troy Marsh

## 2014-04-01 NOTE — Telephone Encounter (Signed)
[facility]ed [facility] spoke with sara/pharmacist she stated that they had the generic testosterone enanthate will change to the generic brand, but will note  If not available can switch to the depo-testerone...Troy Marsh

## 2014-04-01 NOTE — Assessment & Plan Note (Signed)
Continue with current prescription therapy as reflected on the Med list.  

## 2014-04-01 NOTE — Assessment & Plan Note (Signed)
Cont w/current Rx 

## 2014-04-01 NOTE — Telephone Encounter (Signed)
Ok w/me Thx 

## 2014-04-01 NOTE — Assessment & Plan Note (Signed)
Deep ulcer s/p removal is healing

## 2014-04-02 ENCOUNTER — Telehealth: Payer: Self-pay

## 2014-04-02 DIAGNOSIS — Z87891 Personal history of nicotine dependence: Secondary | ICD-10-CM | POA: Insufficient documentation

## 2014-04-02 NOTE — Telephone Encounter (Signed)
Received PA request form for cialis 5 mg po daily qty 90.  Form has been filled out and faxed and awaiting reponse

## 2014-04-08 ENCOUNTER — Encounter: Payer: Self-pay | Admitting: Internal Medicine

## 2014-04-08 ENCOUNTER — Ambulatory Visit (AMBULATORY_SURGERY_CENTER): Payer: 59 | Admitting: Internal Medicine

## 2014-04-08 VITALS — BP 138/58 | HR 67 | Temp 96.8°F | Resp 17 | Ht 70.0 in | Wt 154.0 lb

## 2014-04-08 DIAGNOSIS — Z1211 Encounter for screening for malignant neoplasm of colon: Secondary | ICD-10-CM

## 2014-04-08 MED ORDER — SODIUM CHLORIDE 0.9 % IV SOLN: 500.0000 mL | INTRAVENOUS | Status: DC

## 2014-04-08 NOTE — Progress Notes (Signed)
Report to PACU, RN, vss, BBS= Clear.  

## 2014-04-08 NOTE — Patient Instructions (Signed)
YOU HAD AN ENDOSCOPIC PROCEDURE TODAY AT THE [facility]: Refer to the procedure report that was given to you for any specific questions about what was found during the examination.  If the procedure report does not answer your questions, please call your gastroenterologist to clarify.  If you requested that your care partner not be given the details of your procedure findings, then the procedure report has been included in a sealed envelope for you to review at your convenience later.  YOU SHOULD EXPECT: Some feelings of bloating in the abdomen. Passage of more gas than usual.  Walking can help get rid of the air that was put into your GI tract during the procedure and reduce the bloating. If you had a lower endoscopy (such as a colonoscopy or flexible sigmoidoscopy) you may notice spotting of blood in your stool or on the toilet paper. If you underwent a bowel prep for your procedure, then you may not have a normal bowel movement for a few days.  DIET: Your first meal following the procedure should be a light meal and then it is ok to progress to your normal diet.  A half-sandwich or bowl of soup is an example of a good first meal.  Heavy or fried foods are harder to digest and may make you feel nauseous or bloated.  Likewise meals heavy in dairy and vegetables can cause extra gas to form and this can also increase the bloating.  Drink plenty of fluids but you should avoid alcoholic beverages for 24 hours.  ACTIVITY: Your care partner should take you home directly after the procedure.  You should plan to take it easy, moving slowly for the rest of the day.  You can resume normal activity the day after the procedure however you should NOT DRIVE or use heavy machinery for 24 hours (because of the sedation medicines used during the test).    SYMPTOMS TO REPORT IMMEDIATELY: A gastroenterologist can be reached at any hour.  During normal business hours, 8:30 AM to 5:00 PM Monday through Friday,  call [phone].  After hours and on weekends, please call the GI answering service at [phone] who will take a message and have the physician on call contact you.   Following lower endoscopy (colonoscopy or flexible sigmoidoscopy):  Excessive amounts of blood in the stool  Significant tenderness or worsening of abdominal pains  Swelling of the abdomen that is new, acute  Fever of 100F or higher    FOLLOW UP: If any biopsies were taken you will be contacted by phone or by letter within the next 1-3 weeks.  Call your gastroenterologist if you have not heard about the biopsies in 3 weeks.  Our staff will call the home number listed on your records the next business day following your procedure to check on you and address any questions or concerns that you may have at that time regarding the information given to you following your procedure. This is a courtesy call and so if there is no answer at the home number and we have not heard from you through the emergency physician on call, we will assume that you have returned to your regular daily activities without incident.  SIGNATURES/CONFIDENTIALITY: You and/or your care partner have signed paperwork which will be entered into your electronic medical record.  These signatures attest to the fact that that the information above on your After Visit Summary has been reviewed and is understood.  Full responsibility of the confidentiality   of this discharge information lies with you and/or your care-partner.     

## 2014-04-08 NOTE — Op Note (Signed)
[facility] [place]  [address] [place], [id number]   COLONOSCOPY PROCEDURE REPORT  PATIENT: Troy, Marsh  MR#: [id number] BIRTHDATE: 09-08-1950 , 59  yrs. old GENDER: male ENDOSCOPIST: Eustace Quail, MD REFERRED MB:[id number] Recall, M.D. PROCEDURE DATE:  04/08/2014 PROCEDURE:   Colonoscopy, screening First Screening Colonoscopy - Avg.  risk and is 50 yrs.  old or older - No.  Prior Negative Screening - Now for repeat screening. 10 or more years since last screening  History of Adenoma - Now for follow-up colonoscopy & has been > or = to 3 yrs.  N/A  Polyps Removed Today? No.  Recommend repeat exam, <10 yrs? No. ASA CLASS:   Class II INDICATIONS:average risk for colorectal cancer. Normal index examination 2003. MEDICATIONS: Propofol 270 mg and Monitored anesthesia care  DESCRIPTION OF PROCEDURE:   After the risks benefits and alternatives of the procedure were thoroughly explained, informed consent was obtained.  The digital rectal exam revealed no abnormalities of the rectum.   The [id number]  endoscope was introduced through the anus and advanced to the cecum, which was identified by both the appendix and ileocecal valve. No adverse events experienced.   The quality of the prep was adequate, using MoviPrep  The instrument was then slowly withdrawn as the colon was fully examined.  COLON FINDINGS: A normal appearing cecum, ileocecal valve, and appendiceal orifice were identified.  The ascending, transverse, descending, sigmoid colon, and rectum appeared unremarkable. Retroflexed views revealed internal hemorrhoids. The time to cecum=3 minutes 05 seconds.  Withdrawal time=9 minutes 19 seconds. The scope was withdrawn and the procedure completed. COMPLICATIONS: There were no complications.  ENDOSCOPIC IMPRESSION: 1. Normal colonoscopy  RECOMMENDATIONS: 1. Continue current colorectal screening recommendations for "routine risk" patients with a  repeat colonoscopy in 10 years.  eSigned:  Eustace Quail, MD 04/08/2014 12:22 PM   cc: Altamese Stapleton.  Plotnikov, MD and The Patient

## 2014-04-09 ENCOUNTER — Telehealth: Payer: Self-pay | Admitting: *Deleted

## 2014-04-09 NOTE — Telephone Encounter (Signed)
  Follow up Call-  Call back number 04/08/2014  Post procedure Call Back phone  # [phone]  Permission to leave phone message Yes     Patient questions:  Do you have a fever, pain , or abdominal swelling? No. Pain Score  0 *  Have you tolerated food without any problems? Yes.    Have you been able to return to your normal activities? Yes.    Do you have any questions about your discharge instructions: Diet   No. Medications  No. Follow up visit  No.  Do you have questions or concerns about your Care? No.  Actions: * If pain score is 4 or above: No action needed, pain <4.

## 2014-04-13 ENCOUNTER — Ambulatory Visit
Admission: RE | Admit: 2014-04-13 | Discharge: 2014-04-13 | Disposition: A | Discharge status: Home / Self Care | Payer: 59 | Source: Ambulatory Visit | Attending: Orthopedic Surgery | Admitting: Orthopedic Surgery

## 2014-04-13 DIAGNOSIS — M545 Low back pain, unspecified: Secondary | ICD-10-CM

## 2014-04-13 DIAGNOSIS — G8929 Other chronic pain: Secondary | ICD-10-CM

## 2014-04-13 MED ORDER — METHYLPREDNISOLONE ACETATE 40 MG/ML INJ SUSP (RADIOLOG
120.0000 mg | Freq: Once | INTRAMUSCULAR | Status: AC
  Administered 2014-04-13: 120 mg via EPIDURAL

## 2014-04-13 MED ORDER — IOHEXOL 180 MG/ML  SOLN
1.0000 mL | Freq: Once | INTRAMUSCULAR | Status: AC | PRN
  Administered 2014-04-13: 1 mL via INTRAVENOUS

## 2014-04-16 ENCOUNTER — Encounter: Payer: Self-pay | Admitting: Internal Medicine

## 2014-05-04 NOTE — Telephone Encounter (Signed)
I called [phone] and Cialis 5 mg PA is approved for 30 per 30 days until 05/05/15.

## 2014-06-01 ENCOUNTER — Other Ambulatory Visit: Payer: Self-pay | Admitting: Orthopedic Surgery

## 2014-06-01 DIAGNOSIS — M545 Low back pain, unspecified: Secondary | ICD-10-CM

## 2014-06-01 DIAGNOSIS — G8929 Other chronic pain: Secondary | ICD-10-CM

## 2014-06-10 ENCOUNTER — Other Ambulatory Visit: Payer: 59

## 2014-06-16 ENCOUNTER — Ambulatory Visit
Admission: RE | Admit: 2014-06-16 | Discharge: 2014-06-16 | Disposition: A | Discharge status: Home / Self Care | Payer: 59 | Source: Ambulatory Visit | Attending: Orthopedic Surgery | Admitting: Orthopedic Surgery

## 2014-06-16 DIAGNOSIS — G8929 Other chronic pain: Secondary | ICD-10-CM

## 2014-06-16 DIAGNOSIS — M545 Low back pain, unspecified: Secondary | ICD-10-CM

## 2014-06-16 MED ORDER — METHYLPREDNISOLONE ACETATE 40 MG/ML INJ SUSP (RADIOLOG
120.0000 mg | Freq: Once | INTRAMUSCULAR | Status: AC
  Administered 2014-06-16: 120 mg via EPIDURAL

## 2014-06-16 MED ORDER — IOHEXOL 180 MG/ML  SOLN
1.0000 mL | Freq: Once | INTRAMUSCULAR | Status: AC | PRN
  Administered 2014-06-16: 1 mL via EPIDURAL

## 2014-07-22 ENCOUNTER — Other Ambulatory Visit: Payer: Self-pay | Admitting: Orthopedic Surgery

## 2014-07-22 DIAGNOSIS — G8929 Other chronic pain: Secondary | ICD-10-CM

## 2014-07-22 DIAGNOSIS — M545 Low back pain, unspecified: Secondary | ICD-10-CM

## 2014-08-06 ENCOUNTER — Ambulatory Visit
Admission: RE | Admit: 2014-08-06 | Discharge: 2014-08-06 | Disposition: A | Discharge status: Home / Self Care | Payer: 59 | Source: Ambulatory Visit | Attending: Orthopedic Surgery | Admitting: Orthopedic Surgery

## 2014-08-06 DIAGNOSIS — G8929 Other chronic pain: Secondary | ICD-10-CM

## 2014-08-06 DIAGNOSIS — M545 Low back pain, unspecified: Secondary | ICD-10-CM

## 2014-08-06 MED ORDER — METHYLPREDNISOLONE ACETATE 40 MG/ML INJ SUSP (RADIOLOG
120.0000 mg | Freq: Once | INTRAMUSCULAR | Status: AC
  Administered 2014-08-06: 120 mg via EPIDURAL

## 2014-08-06 MED ORDER — IOHEXOL 180 MG/ML  SOLN
1.0000 mL | Freq: Once | INTRAMUSCULAR | Status: AC | PRN
  Administered 2014-08-06: 1 mL via EPIDURAL

## 2014-08-27 ENCOUNTER — Other Ambulatory Visit: Payer: Self-pay | Admitting: Orthopedic Surgery

## 2014-08-27 DIAGNOSIS — M545 Low back pain: Secondary | ICD-10-CM

## 2014-09-28 ENCOUNTER — Other Ambulatory Visit (INDEPENDENT_AMBULATORY_CARE_PROVIDER_SITE_OTHER): Payer: 59

## 2014-09-28 ENCOUNTER — Ambulatory Visit (INDEPENDENT_AMBULATORY_CARE_PROVIDER_SITE_OTHER): Payer: 59 | Admitting: Internal Medicine

## 2014-09-28 ENCOUNTER — Encounter: Payer: Self-pay | Admitting: Internal Medicine

## 2014-09-28 VITALS — BP 158/98 | HR 60 | Temp 97.5°F | Wt 154.0 lb

## 2014-09-28 DIAGNOSIS — K649 Unspecified hemorrhoids: Secondary | ICD-10-CM

## 2014-09-28 DIAGNOSIS — Z0000 Encounter for general adult medical examination without abnormal findings: Secondary | ICD-10-CM

## 2014-09-28 DIAGNOSIS — E291 Testicular hypofunction: Secondary | ICD-10-CM

## 2014-09-28 DIAGNOSIS — C4490 Unspecified malignant neoplasm of skin, unspecified: Secondary | ICD-10-CM

## 2014-09-28 DIAGNOSIS — B20 Human immunodeficiency virus [HIV] disease: Secondary | ICD-10-CM

## 2014-09-28 DIAGNOSIS — I10 Essential (primary) hypertension: Secondary | ICD-10-CM

## 2014-09-28 LAB — CBC WITH DIFFERENTIAL/PLATELET
Basophils Absolute: 0 10*3/uL (ref 0.0–0.1)
Basophils Relative: 0.6 % (ref 0.0–3.0)
EOS ABS: 0.1 10*3/uL (ref 0.0–0.7)
Eosinophils Relative: 1.4 % (ref 0.0–5.0)
HEMATOCRIT: 49.8 % (ref 39.0–52.0)
Hemoglobin: 16.9 g/dL (ref 13.0–17.0)
LYMPHS ABS: 1.7 10*3/uL (ref 0.7–4.0)
Lymphocytes Relative: 31.5 % (ref 12.0–46.0)
MCHC: 33.9 g/dL (ref 30.0–36.0)
MCV: 83 fl (ref 78.0–100.0)
Monocytes Absolute: 0.5 10*3/uL (ref 0.1–1.0)
Monocytes Relative: 9.1 % (ref 3.0–12.0)
Neutro Abs: 3.1 10*3/uL (ref 1.4–7.7)
Neutrophils Relative %: 57.4 % (ref 43.0–77.0)
PLATELETS: 180 10*3/uL (ref 150.0–400.0)
RBC: 6 Mil/uL — ABNORMAL HIGH (ref 4.22–5.81)
RDW: 15.5 % (ref 11.5–15.5)
WBC: 5.4 10*3/uL (ref 4.0–10.5)

## 2014-09-28 LAB — HEPATIC FUNCTION PANEL
ALBUMIN: 4.5 g/dL (ref 3.5–5.2)
ALK PHOS: 48 U/L (ref 39–117)
ALT: 20 U/L (ref 0–53)
AST: 21 U/L (ref 0–37)
BILIRUBIN DIRECT: 0.1 mg/dL (ref 0.0–0.3)
Total Bilirubin: 0.6 mg/dL (ref 0.2–1.2)
Total Protein: 6.7 g/dL (ref 6.0–8.3)

## 2014-09-28 LAB — URINALYSIS
Bilirubin Urine: NEGATIVE
HGB URINE DIPSTICK: NEGATIVE
KETONES UR: NEGATIVE
Leukocytes, UA: NEGATIVE
Nitrite: NEGATIVE
SPECIFIC GRAVITY, URINE: 1.01 (ref 1.000–1.030)
Total Protein, Urine: NEGATIVE
UROBILINOGEN UA: 0.2 (ref 0.0–1.0)
Urine Glucose: NEGATIVE
pH: 7 (ref 5.0–8.0)

## 2014-09-28 LAB — LIPID PANEL
CHOLESTEROL: 216 mg/dL — AB (ref 0–200)
HDL: 73 mg/dL (ref >39.00)
LDL Cholesterol: 122 mg/dL — ABNORMAL HIGH (ref 0–99)
NonHDL: 143
Total CHOL/HDL Ratio: 3
Triglycerides: 106 mg/dL (ref 0.0–149.0)
VLDL: 21.2 mg/dL (ref 0.0–40.0)

## 2014-09-28 LAB — BASIC METABOLIC PANEL
BUN: 14 mg/dL (ref 6–23)
CHLORIDE: 97 meq/L (ref 96–112)
CO2: 35 mEq/L — ABNORMAL HIGH (ref 19–32)
Calcium: 10.4 mg/dL (ref 8.4–10.5)
Creatinine, Ser: 1.28 mg/dL (ref 0.40–1.50)
GFR: 60.01 mL/min (ref >60.00)
GLUCOSE: 91 mg/dL (ref 70–99)
POTASSIUM: 4 meq/L (ref 3.5–5.1)
Sodium: 137 mEq/L (ref 135–145)

## 2014-09-28 LAB — TSH: TSH: 3.48 u[IU]/mL (ref 0.35–4.50)

## 2014-09-28 LAB — VITAMIN B12: VITAMIN B 12: 454 pg/mL (ref 211–911)

## 2014-09-28 LAB — PSA: PSA: 2.04 ng/mL (ref 0.10–4.00)

## 2014-09-28 LAB — TESTOSTERONE: TESTOSTERONE: 138.04 ng/dL — AB (ref 300.00–890.00)

## 2014-09-28 NOTE — Assessment & Plan Note (Signed)
Check BP at home - call if elevated 

## 2014-09-28 NOTE — Assessment & Plan Note (Signed)
Testosterone replacment q 2 weeks IM and using caverject  Potential benefits of a long term testosterone use as well as potential risks  and complications were explained to the patient and were aknowledged. 

## 2014-09-28 NOTE — Assessment & Plan Note (Signed)
GI ref - Dr Rushie Nyhan

## 2014-09-28 NOTE — Assessment & Plan Note (Signed)
On 5FU cream

## 2014-09-28 NOTE — Progress Notes (Signed)
Pre visit review using our clinic review tool, if applicable. No additional management support is needed unless otherwise documented below in the visit note. 

## 2014-09-28 NOTE — Assessment & Plan Note (Signed)
Pt is in the study

## 2014-09-28 NOTE — Progress Notes (Signed)
Subjective:    Patient ID: Troy Marsh, male    DOB: [birth year], 64 y.o.   MRN: [id number]  HPI  C/o external hemorrhoids - irritated, bleeding sometimes C/o rah on face due to 5FU cream The patient needs to address  chronic hypertension that has been well controlled with medicines; to address chronic HIV, hyperlipidemia controlled with medicines as well  BP Readings from Last 3 Encounters:  09/28/14 158/98  08/06/14 153/81  06/16/14 133/88   Wt Readings from Last 3 Encounters:  09/28/14 154 lb (69.854 kg)  04/08/14 154 lb (69.854 kg)  03/30/14 155 lb (70.308 kg)      Review of Systems  Constitutional: Negative for appetite change, fatigue and unexpected weight change.  HENT: Negative for congestion, ear pain, nosebleeds, sneezing, sore throat and trouble swallowing.   Eyes: Negative for itching and visual disturbance.  Respiratory: Negative for cough.   Cardiovascular: Negative for chest pain, palpitations and leg swelling.  Gastrointestinal: Negative for nausea, diarrhea, blood in stool and abdominal distention.  Genitourinary: Negative for frequency and hematuria.  Musculoskeletal: Negative for back pain, joint swelling, gait problem and neck pain.  Skin: Negative for rash and wound.  Neurological: Negative for dizziness, tremors, speech difficulty and weakness.  Psychiatric/Behavioral: Negative for suicidal ideas, sleep disturbance, dysphoric mood and agitation. The patient is not nervous/anxious.        Objective:   Physical Exam  Constitutional: He is oriented to person, place, and time. He appears well-developed and well-nourished. No distress.  HENT:  Head: Normocephalic and atraumatic.  Right Ear: External ear normal.  Left Ear: External ear normal.  Nose: Nose normal.  Mouth/Throat: Oropharynx is clear and moist. No oropharyngeal exudate.  Eyes: Conjunctivae and EOM are normal. Pupils are equal, round, and reactive to light. Right eye exhibits no  discharge. Left eye exhibits no discharge. No scleral icterus.  Neck: Normal range of motion. Neck supple. No JVD present. No tracheal deviation present. No thyromegaly present.  Cardiovascular: Normal rate, regular rhythm, normal heart sounds and intact distal pulses.  Exam reveals no gallop and no friction rub.   No murmur heard. Pulmonary/Chest: Effort normal and breath sounds normal. No stridor. No respiratory distress. He has no wheezes. He has no rales. He exhibits no tenderness.  Abdominal: Soft. Bowel sounds are normal. He exhibits no distension and no mass. There is no tenderness. There is no rebound and no guarding.  Genitourinary: Rectum normal, prostate normal and penis normal. Guaiac negative stool. No penile tenderness.  Musculoskeletal: Normal range of motion. He exhibits no edema or tenderness.  Lymphadenopathy:    He has no cervical adenopathy.  Neurological: He is alert and oriented to person, place, and time. He has normal reflexes. No cranial nerve deficit. He exhibits normal muscle tone. Coordination normal.  Skin: Skin is warm and dry. No rash noted. He is not diaphoretic. No erythema. No pallor.  Psychiatric: He has a normal mood and affect. His behavior is normal. Judgment and thought content normal.  B nipples w/hypertrophy Large deep granulating ulcer on R anterior distal shin   Lab Results  Component Value Date   WBC 4.7 09/30/2012   HGB 14.8 09/30/2012   HCT 43 09/30/2012   PLT 194 09/30/2012   GLUCOSE 96 05/21/2006   CHOL 201* 09/30/2012   TRIG 98 09/30/2012   HDL 84* 09/30/2012   LDLDIRECT 113.3 05/21/2006   LDLCALC 97 09/30/2012   ALT 22 09/30/2012   AST 33 09/30/2012   NA  141 09/30/2012   K 3.6 09/30/2012   CL 99 05/21/2006   CREATININE 1.1 09/30/2012   BUN 15 09/30/2012   CO2 34* 05/21/2006   TSH 2.45 05/21/2006   PSA 0.61 05/21/2006        Assessment & Plan:  Patient ID: Troy Marsh, male   DOB: [birth year], 64 y.o.   MRN:  [id number]

## 2014-10-20 ENCOUNTER — Ambulatory Visit
Admission: RE | Admit: 2014-10-20 | Discharge: 2014-10-20 | Disposition: A | Discharge status: Home / Self Care | Payer: 59 | Source: Ambulatory Visit | Attending: Orthopedic Surgery | Admitting: Orthopedic Surgery

## 2014-10-20 DIAGNOSIS — M545 Low back pain: Secondary | ICD-10-CM

## 2014-10-20 MED ORDER — IOHEXOL 180 MG/ML  SOLN
1.0000 mL | Freq: Once | INTRAMUSCULAR | Status: AC | PRN
  Administered 2014-10-20: 1 mL via EPIDURAL

## 2014-10-20 MED ORDER — METHYLPREDNISOLONE ACETATE 40 MG/ML INJ SUSP (RADIOLOG
120.0000 mg | Freq: Once | INTRAMUSCULAR | Status: AC
  Administered 2014-10-20: 120 mg via EPIDURAL

## 2014-10-26 ENCOUNTER — Telehealth: Payer: Self-pay | Admitting: *Deleted

## 2014-10-26 NOTE — Telephone Encounter (Signed)
Ok x 6 months Thx

## 2014-10-26 NOTE — Telephone Encounter (Signed)
rf req for Testosterone 200 mg inject 1 ml q 14 days. 90 day supply. Ok to Rf?

## 2014-10-27 MED ORDER — TESTOSTERONE ENANTHATE 200 MG/ML IM SOLN: 200.0000 mg | INTRAMUSCULAR | Status: DC

## 2014-10-27 NOTE — Telephone Encounter (Signed)
Rx printed/signed/faxed to pharmacy

## 2014-12-22 ENCOUNTER — Other Ambulatory Visit: Payer: Self-pay | Admitting: Orthopedic Surgery

## 2014-12-22 DIAGNOSIS — G8929 Other chronic pain: Secondary | ICD-10-CM

## 2014-12-22 DIAGNOSIS — M545 Low back pain, unspecified: Secondary | ICD-10-CM

## 2015-01-04 ENCOUNTER — Ambulatory Visit
Admission: RE | Admit: 2015-01-04 | Discharge: 2015-01-04 | Disposition: A | Discharge status: Home / Self Care | Payer: 59 | Source: Ambulatory Visit | Attending: Orthopedic Surgery | Admitting: Orthopedic Surgery

## 2015-01-04 DIAGNOSIS — M545 Low back pain, unspecified: Secondary | ICD-10-CM

## 2015-01-04 DIAGNOSIS — G8929 Other chronic pain: Secondary | ICD-10-CM

## 2015-01-04 MED ORDER — IOHEXOL 180 MG/ML  SOLN
1.0000 mL | Freq: Once | INTRAMUSCULAR | Status: AC | PRN
  Administered 2015-01-04: 1 mL via EPIDURAL

## 2015-01-04 MED ORDER — METHYLPREDNISOLONE ACETATE 40 MG/ML INJ SUSP (RADIOLOG
120.0000 mg | Freq: Once | INTRAMUSCULAR | Status: AC
  Administered 2015-01-04: 120 mg via EPIDURAL

## 2015-02-15 ENCOUNTER — Other Ambulatory Visit: Payer: Self-pay | Admitting: Internal Medicine

## 2015-03-18 ENCOUNTER — Other Ambulatory Visit: Payer: Self-pay | Admitting: Orthopedic Surgery

## 2015-03-18 DIAGNOSIS — M545 Low back pain: Secondary | ICD-10-CM

## 2015-03-30 ENCOUNTER — Ambulatory Visit
Admission: RE | Admit: 2015-03-30 | Discharge: 2015-03-30 | Disposition: A | Discharge status: Home / Self Care | Payer: Medicare Other | Source: Ambulatory Visit | Attending: Orthopedic Surgery | Admitting: Orthopedic Surgery

## 2015-03-30 DIAGNOSIS — M545 Low back pain: Secondary | ICD-10-CM

## 2015-03-30 MED ORDER — IOHEXOL 180 MG/ML  SOLN
1.0000 mL | Freq: Once | INTRAMUSCULAR | Status: DC | PRN
  Administered 2015-03-30: 1 mL via EPIDURAL

## 2015-03-30 MED ORDER — METHYLPREDNISOLONE ACETATE 40 MG/ML INJ SUSP (RADIOLOG
120.0000 mg | Freq: Once | INTRAMUSCULAR | Status: AC
  Administered 2015-03-30: 120 mg via EPIDURAL

## 2015-03-31 ENCOUNTER — Ambulatory Visit: Payer: 59 | Admitting: Internal Medicine

## 2015-04-12 ENCOUNTER — Ambulatory Visit (INDEPENDENT_AMBULATORY_CARE_PROVIDER_SITE_OTHER): Payer: Medicare Other | Admitting: Internal Medicine

## 2015-04-12 ENCOUNTER — Encounter: Payer: Self-pay | Admitting: Internal Medicine

## 2015-04-12 VITALS — BP 124/80 | HR 62 | Temp 98.4°F | Wt 155.0 lb

## 2015-04-12 DIAGNOSIS — N528 Other male erectile dysfunction: Secondary | ICD-10-CM

## 2015-04-12 DIAGNOSIS — M545 Low back pain, unspecified: Secondary | ICD-10-CM

## 2015-04-12 DIAGNOSIS — G8929 Other chronic pain: Secondary | ICD-10-CM

## 2015-04-12 DIAGNOSIS — Z23 Encounter for immunization: Secondary | ICD-10-CM | POA: Diagnosis not present

## 2015-04-12 DIAGNOSIS — N529 Male erectile dysfunction, unspecified: Secondary | ICD-10-CM | POA: Insufficient documentation

## 2015-04-12 DIAGNOSIS — B20 Human immunodeficiency virus [HIV] disease: Secondary | ICD-10-CM | POA: Diagnosis not present

## 2015-04-12 MED ORDER — TADALAFIL 5 MG PO TABS: ORAL_TABLET | ORAL | Status: DC

## 2015-04-12 NOTE — Progress Notes (Signed)
Pre visit review using our clinic review tool, if applicable. No additional management support is needed unless otherwise documented below in the visit note. 

## 2015-04-12 NOTE — Assessment & Plan Note (Signed)
On Celebrex 

## 2015-04-12 NOTE — Assessment & Plan Note (Signed)
On Cialis and Caverjects prn 

## 2015-04-12 NOTE — Progress Notes (Signed)
Subjective:  Patient ID: Troy Marsh, male    DOB: January 23, 1951  Age: 64 y.o. MRN: [id number]  CC: No chief complaint on file.   HPI Troy Marsh presents for HIV, HTN, OA (LBP) f/u. Never had a Zostavax.  Outpatient Prescriptions Prior to Visit  Medication Sig Dispense Refill  . Alprostadil, Vasodilator, (CAVERJECT) 20 MCG SOLR USE CAVERJECT 20 MCG IMPULSE BOX, EACH BOX 2 INJECTIONS, USE AS DIRECTED 108 each 3  . aspirin 81 MG tablet Take 81 mg by mouth daily.      [name] CAVERJECT IMPULSE 20 MCG injection     . celecoxib (CELEBREX) 200 MG capsule Take 1 capsule (200 mg total) by mouth 2 (two) times daily. 180 capsule 3  . efavirenz-emtrictabine-tenofovir (ATRIPLA) 600-200-300 MG per tablet Take 1 tablet by mouth at bedtime.      . fluorouracil (EFUDEX) 5 % cream Apply topically 2 (two) times daily.    . hydrochlorothiazide (HYDRODIURIL) 50 MG tablet Take 1 tablet (50 mg total) by mouth daily. 90 tablet 3  . HYDROcodone-acetaminophen (NORCO) 7.5-325 MG per tablet Take 1 tablet by mouth daily. Patient states he takes 1/2 to 1 tablet daily.    . Melatonin 5 MG TABS Take by mouth.      . multivitamin (THERAGRAN) per tablet Take 1 tablet by mouth daily.      . NON FORMULARY Bacterio-static water- use with Caverject     . SAFETY-LOK INSULIN SYR 1CC/29G 29G X 1/2" 1 ML MISC USE 1 SYRINGE FOR         INJECTION INTO THE SKIN   TWICE A WEEK 10 each 10  . SYRINGE-NEEDLE, DISP, 3 ML (B-D 3CC LUER-LOK SYR 23GX1-1/2) 23G X 1-1/2" 3 ML MISC USE 1 SYRINGE EVERY 2     WEEKS AS DIRECTED WITH    TESTOSTERONE 18 each 3  . terbinafine (LAMISIL) 1 % cream Apply 1 application topically 2 (two) times daily.    [name] testosterone enanthate (DELATESTRYL) 200 MG/ML injection Inject 1 mL (200 mg total) into the muscle every 14 (fourteen) days. 15 mL 1  . triamcinolone cream (KENALOG) 0.1 % Apply 1 application topically 2 (two) times daily.    . Zinc 50 MG TABS Take 1 tablet by mouth daily.      . tadalafil  (CIALIS) 5 MG tablet TAKE 1 TABLET EVERY DAY 90 tablet 1   No facility-administered medications prior to visit.    ROS Review of Systems  Constitutional: Negative for appetite change, fatigue and unexpected weight change.  HENT: Negative for congestion, nosebleeds, sneezing, sore throat and trouble swallowing.   Eyes: Negative for itching and visual disturbance.  Respiratory: Negative for cough.   Cardiovascular: Negative for chest pain, palpitations and leg swelling.  Gastrointestinal: Negative for nausea, diarrhea, blood in stool and abdominal distention.  Genitourinary: Negative for frequency and hematuria.  Musculoskeletal: Negative for back pain, joint swelling, gait problem and neck pain.  Skin: Negative for rash.  Neurological: Negative for dizziness, tremors, speech difficulty and weakness.  Psychiatric/Behavioral: Negative for suicidal ideas, sleep disturbance, dysphoric mood, decreased concentration and agitation. The patient is not nervous/anxious.     Objective:  BP 124/80 mmHg  Pulse 62  Temp(Src) 98.4 F (36.9 C) (Oral)  Wt 155 lb (70.308 kg)  SpO2 97%  BP Readings from Last 3 Encounters:  04/12/15 124/80  03/30/15 135/76  01/04/15 158/84    Wt Readings from Last 3 Encounters:  04/12/15 155 lb (70.308 kg)  09/28/14 154  lb (69.854 kg)  04/08/14 154 lb (69.854 kg)    Physical Exam  Constitutional: He is oriented to person, place, and time. He appears well-developed. No distress.  NAD  HENT:  Mouth/Throat: Oropharynx is clear and moist.  Eyes: Conjunctivae are normal. Pupils are equal, round, and reactive to light.  Neck: Normal range of motion. No JVD present. No thyromegaly present.  Cardiovascular: Normal rate, regular rhythm, normal heart sounds and intact distal pulses.  Exam reveals no gallop and no friction rub.   No murmur heard. Pulmonary/Chest: Effort normal and breath sounds normal. No respiratory distress. He has no wheezes. He has no rales. He  exhibits no tenderness.  Abdominal: Soft. Bowel sounds are normal. He exhibits no distension and no mass. There is no tenderness. There is no rebound and no guarding.  Musculoskeletal: Normal range of motion. He exhibits no edema or tenderness.  Lymphadenopathy:    He has no cervical adenopathy.  Neurological: He is alert and oriented to person, place, and time. He has normal reflexes. No cranial nerve deficit. He exhibits normal muscle tone. He displays a negative Romberg sign. Coordination and gait normal.  Skin: Skin is warm and dry. No rash noted.  Psychiatric: He has a normal mood and affect. His behavior is normal. Judgment and thought content normal.    Lab Results  Component Value Date   WBC 5.4 09/28/2014   HGB 16.9 09/28/2014   HCT 49.8 09/28/2014   PLT 180.0 09/28/2014   GLUCOSE 91 09/28/2014   CHOL 216* 09/28/2014   TRIG 106.0 09/28/2014   HDL 73.00 09/28/2014   LDLDIRECT 113.3 05/21/2006   LDLCALC 122* 09/28/2014   ALT 20 09/28/2014   AST 21 09/28/2014   NA 137 09/28/2014   K 4.0 09/28/2014   CL 97 09/28/2014   CREATININE 1.28 09/28/2014   BUN 14 09/28/2014   CO2 35* 09/28/2014   TSH 3.48 09/28/2014   PSA 2.04 09/28/2014    Dg Epidurography  03/30/2015   CLINICAL DATA:  Low back pain. Left lower extremity radiculitis. Displacement of the L4-5 lumbar disc. The patient has excellent relief of pain. Pain recurred after lifting related to moving.  FLUOROSCOPY TIME:  One hundred nine Gy*cm2  PROCEDURE: The procedure, risks, benefits, and alternatives were explained to the patient. Questions regarding the procedure were encouraged and answered. The patient understands and consents to the procedure.  LUMBAR EPIDURAL INJECTION:  An interlaminar approach was performed on left at L4-5. The overlying skin was cleansed and anesthetized. A 20 gauge Crawford epidural needle was advanced using loss-of-resistance technique.  DIAGNOSTIC EPIDURAL INJECTION:  Injection of Omnipaque 180  shows a good epidural pattern with spread above and below the level of needle placement, primarily on the left no vascular opacification is seen.  THERAPEUTIC EPIDURAL INJECTION:  120 Mg of Depo-Medrol mixed with 3 mL 1% lidocaine were instilled. The procedure was well-tolerated, and the patient was discharged thirty minutes following the injection in good condition.  COMPLICATIONS: None  IMPRESSION: Technically successful epidural injection on the left L4-5.   Electronically Signed   By: San Morelle M.D.   On: 03/30/2015 10:31    Assessment & Plan:   Diagnoses and all orders for this visit:  Chronic lower back pain  Human immunodeficiency virus (HIV) disease  Chronic LBP  Other male erectile dysfunction  Need for influenza vaccination -     Flu Vaccine QUAD 36+ mos IM  Need for shingles vaccine -     Varicella-zoster  vaccine subcutaneous  Other orders -     tadalafil (CIALIS) 5 MG tablet; TAKE 1 TABLET EVERY DAY  I am having Troy Marsh maintain his aspirin, efavirenz-emtricitabine-tenofovir, NON FORMULARY, Melatonin, multivitamin, Zinc, HYDROcodone-acetaminophen, Alprostadil (Vasodilator), SYRINGE-NEEDLE (DISP) 3 ML, hydrochlorothiazide, celecoxib, CAVERJECT IMPULSE, fluorouracil, terbinafine, triamcinolone cream, testosterone enanthate, SAFETY-LOK INSULIN SYR 1CC/29G, and tadalafil.  Meds ordered this encounter  Medications  . tadalafil (CIALIS) 5 MG tablet    Sig: TAKE 1 TABLET EVERY DAY    Dispense:  90 tablet    Refill:  3     Follow-up: Return in about 6 months (around 10/10/2015) for Wellness Exam.  [name], MD

## 2015-04-12 NOTE — Assessment & Plan Note (Signed)
Follwed in Clinic at [facility] by Dr. Allen Norris.

## 2015-04-15 ENCOUNTER — Telehealth: Payer: Self-pay | Admitting: Internal Medicine

## 2015-04-15 NOTE — Telephone Encounter (Signed)
Left message for pt to call back  °

## 2015-04-16 NOTE — Telephone Encounter (Signed)
Pt scheduled to see Dr. Henrene Pastor 05/24/15@9am . Pt aware of appt.

## 2015-04-16 NOTE — Telephone Encounter (Signed)
Left message for pt to call back  °

## 2015-04-24 ENCOUNTER — Other Ambulatory Visit: Payer: Self-pay | Admitting: Internal Medicine

## 2015-05-12 ENCOUNTER — Other Ambulatory Visit: Payer: Self-pay | Admitting: *Deleted

## 2015-05-12 MED ORDER — TESTOSTERONE ENANTHATE 200 MG/ML IM SOLN: 200.0000 mg | INTRAMUSCULAR | Status: DC

## 2015-05-12 NOTE — Telephone Encounter (Signed)
Rf req for Testosterone Enan inj. 90 day supply. Ok to Rf? If so, fax printed Rx to [phone].

## 2015-05-13 MED ORDER — TESTOSTERONE ENANTHATE 200 MG/ML IM SOLN: 200.0000 mg | INTRAMUSCULAR | Status: DC

## 2015-05-13 NOTE — Telephone Encounter (Signed)
Rx printed/signed/faxed to pharmacy

## 2015-05-13 NOTE — Addendum Note (Signed)
Addended by: Cresenciano Lick on: 05/13/2015 12:40 PM   Modules accepted: Orders

## 2015-05-24 ENCOUNTER — Other Ambulatory Visit: Payer: Self-pay | Admitting: Orthopedic Surgery

## 2015-05-24 ENCOUNTER — Encounter: Payer: Self-pay | Admitting: Internal Medicine

## 2015-05-24 ENCOUNTER — Ambulatory Visit (INDEPENDENT_AMBULATORY_CARE_PROVIDER_SITE_OTHER): Payer: Medicare Other | Admitting: Internal Medicine

## 2015-05-24 VITALS — BP 124/74 | HR 72 | Ht 70.0 in | Wt 159.6 lb

## 2015-05-24 DIAGNOSIS — M545 Low back pain, unspecified: Secondary | ICD-10-CM

## 2015-05-24 DIAGNOSIS — K648 Other hemorrhoids: Secondary | ICD-10-CM

## 2015-05-24 DIAGNOSIS — K5901 Slow transit constipation: Secondary | ICD-10-CM | POA: Diagnosis not present

## 2015-05-24 DIAGNOSIS — G8929 Other chronic pain: Secondary | ICD-10-CM

## 2015-05-24 MED ORDER — HYDROCORTISONE ACETATE 25 MG RE SUPP: 25.0000 mg | Freq: Every evening | RECTAL | Status: DC | PRN

## 2015-05-24 NOTE — Patient Instructions (Signed)
Take Miralax daily, adjusting as needed.  We have sent the following medications to your pharmacy for you to pick up at your convenience:  Anusol [facility] suppositories

## 2015-05-24 NOTE — Progress Notes (Signed)
HISTORY OF PRESENT ILLNESS:  Troy Marsh is a 64 y.o. male who presents today with chief complaint of bleeding hemorrhoids. He is accompanied by his partner. Patient has undergone please colonoscopy in 2003 and most recently September 2015. Colonoscopy was normal. Retroflexed view did reveal internal hemorrhoids. Patient reports 6 month history of minor rectal bleeding as manifested by pink blood on the tissue approximate 2 or 3 times per week. There is no associated pain. He does describe prolapsing hemorrhoids. No other complaints. He does tend to be chronically constipated despite daily fiber supplementation. He takes hydrocodone for pain. GI review of systems is otherwise negative  REVIEW OF SYSTEMS:  All non-GI ROS negative on complete review  Past Medical History  Diagnosis Date  . Hypertension   . Hypogonadism male   . HIV disease ([place]) 1985    last CD4 10/02/12 was 277    Past Surgical History  Procedure Laterality Date  . Neck surgery  1984  . Hemorrhoid surgery  1973  . Knee arthroscopy Right 2009  . Knee arthroscopy Left 2011    Social History Troy Marsh  reports that he quit smoking about 11 years ago. His smoking use included Cigarettes. He has a 70 pack-year smoking history. He has never used smokeless tobacco. He reports that he does not drink alcohol or use illicit drugs.  family history includes Cancer in his father; Coronary artery disease in his father; Heart attack in his father; Hyperlipidemia in his father; Hypertension in his father. There is no history of Diabetes or Colon cancer.  No Known Allergies     PHYSICAL EXAMINATION: Vital signs: BP 124/74 mmHg  Pulse 72  Ht 5\' 10"  (1.778 m)  Wt 159 lb 9.6 oz (72.394 kg)  BMI 22.90 kg/m2  Constitutional: generally well-appearing, no acute distress Psychiatric: alert and oriented x3, cooperative Eyes: extraocular movements intact, anicteric, conjunctiva pink Mouth: oral pharynx moist, no  lesions Neck: supple no lymphadenopathy Cardiovascular: heart regular rate and rhythm, no murmur Lungs: clear to auscultation bilaterally Abdomen: soft, nontender, nondistended, no obvious ascites, no peritoneal signs, normal bowel sounds, no organomegaly Rectal: No external abnormalities or tenderness. Internal hemorrhoids noted. No blood Extremities: no clubbing cyanosis or lower extremity edema bilaterally Skin: no lesions on visible extremities Neuro: No focal deficits.   ASSESSMENT:  #1. Bleeding internal hemorrhoids  #2. Chronic constipation. Slow transit #3. Normal colonoscopy save internal hemorrhoids September 2015   PLAN:  #1. Continue fiber supplementation #2. Add MiraLAX one scoop daily for constipation. Adjust accordingly to achieve desired results #3. Prescribed Anusol HC suppositories. One per rectum at night as needed. Multiple refills #4. If hemorrhoids remain problematic despite medical therapies, could consider referral for office banding procedure

## 2015-06-07 ENCOUNTER — Ambulatory Visit
Admission: RE | Admit: 2015-06-07 | Discharge: 2015-06-07 | Disposition: A | Discharge status: Home / Self Care | Payer: Medicare Other | Source: Ambulatory Visit | Attending: Orthopedic Surgery | Admitting: Orthopedic Surgery

## 2015-06-07 ENCOUNTER — Other Ambulatory Visit: Payer: Medicare Other

## 2015-06-07 DIAGNOSIS — M545 Low back pain, unspecified: Secondary | ICD-10-CM

## 2015-06-07 DIAGNOSIS — G8929 Other chronic pain: Secondary | ICD-10-CM

## 2015-06-07 MED ORDER — METHYLPREDNISOLONE ACETATE 40 MG/ML INJ SUSP (RADIOLOG
120.0000 mg | Freq: Once | INTRAMUSCULAR | Status: AC
Start: 2015-06-07 — End: 2015-06-07
  Administered 2015-06-07: 120 mg via EPIDURAL

## 2015-06-07 MED ORDER — IOHEXOL 180 MG/ML  SOLN
1.0000 mL | Freq: Once | INTRAMUSCULAR | Status: DC | PRN
  Administered 2015-06-07: 1 mL via EPIDURAL

## 2015-07-30 ENCOUNTER — Other Ambulatory Visit: Payer: Self-pay | Admitting: Orthopedic Surgery

## 2015-07-30 DIAGNOSIS — M545 Low back pain, unspecified: Secondary | ICD-10-CM

## 2015-07-30 DIAGNOSIS — G8929 Other chronic pain: Secondary | ICD-10-CM

## 2015-08-16 ENCOUNTER — Ambulatory Visit
Admission: RE | Admit: 2015-08-16 | Discharge: 2015-08-16 | Disposition: A | Discharge status: Home / Self Care | Payer: Medicare Other | Source: Ambulatory Visit | Attending: Orthopedic Surgery | Admitting: Orthopedic Surgery

## 2015-08-16 DIAGNOSIS — M545 Low back pain, unspecified: Secondary | ICD-10-CM

## 2015-08-16 DIAGNOSIS — G8929 Other chronic pain: Secondary | ICD-10-CM

## 2015-08-16 MED ORDER — METHYLPREDNISOLONE ACETATE 40 MG/ML INJ SUSP (RADIOLOG
120.0000 mg | Freq: Once | INTRAMUSCULAR | Status: AC
  Administered 2015-08-16: 120 mg via EPIDURAL

## 2015-08-16 MED ORDER — IOHEXOL 180 MG/ML  SOLN
1.0000 mL | Freq: Once | INTRAMUSCULAR | Status: AC | PRN
  Administered 2015-08-16: 1 mL via EPIDURAL

## 2015-08-17 ENCOUNTER — Other Ambulatory Visit: Payer: Self-pay | Admitting: Internal Medicine

## 2015-09-09 ENCOUNTER — Other Ambulatory Visit: Payer: Self-pay | Admitting: Orthopedic Surgery

## 2015-09-09 DIAGNOSIS — G8929 Other chronic pain: Secondary | ICD-10-CM

## 2015-09-09 DIAGNOSIS — M545 Low back pain, unspecified: Secondary | ICD-10-CM

## 2015-09-21 ENCOUNTER — Other Ambulatory Visit: Payer: Medicare Other

## 2015-09-23 ENCOUNTER — Ambulatory Visit
Admission: RE | Admit: 2015-09-23 | Discharge: 2015-09-23 | Disposition: A | Discharge status: Home / Self Care | Payer: Medicare Other | Source: Ambulatory Visit | Attending: Orthopedic Surgery | Admitting: Orthopedic Surgery

## 2015-09-23 DIAGNOSIS — M545 Low back pain, unspecified: Secondary | ICD-10-CM

## 2015-09-23 DIAGNOSIS — G8929 Other chronic pain: Secondary | ICD-10-CM

## 2015-09-23 MED ORDER — IOHEXOL 180 MG/ML  SOLN
1.0000 mL | Freq: Once | INTRAMUSCULAR | Status: AC | PRN
  Administered 2015-09-23: 1 mL via EPIDURAL

## 2015-09-23 MED ORDER — METHYLPREDNISOLONE ACETATE 40 MG/ML INJ SUSP (RADIOLOG
120.0000 mg | Freq: Once | INTRAMUSCULAR | Status: AC
Start: 2015-09-23 — End: 2015-09-23
  Administered 2015-09-23: 120 mg via EPIDURAL

## 2015-10-27 ENCOUNTER — Encounter: Payer: Self-pay | Admitting: Internal Medicine

## 2015-10-27 ENCOUNTER — Other Ambulatory Visit (INDEPENDENT_AMBULATORY_CARE_PROVIDER_SITE_OTHER): Payer: Medicare Other

## 2015-10-27 ENCOUNTER — Ambulatory Visit (INDEPENDENT_AMBULATORY_CARE_PROVIDER_SITE_OTHER): Payer: Medicare Other | Admitting: Internal Medicine

## 2015-10-27 VITALS — BP 120/80 | HR 55 | Ht 70.0 in | Wt 149.0 lb

## 2015-10-27 DIAGNOSIS — R634 Abnormal weight loss: Secondary | ICD-10-CM | POA: Insufficient documentation

## 2015-10-27 DIAGNOSIS — N320 Bladder-neck obstruction: Secondary | ICD-10-CM

## 2015-10-27 DIAGNOSIS — Z0000 Encounter for general adult medical examination without abnormal findings: Secondary | ICD-10-CM

## 2015-10-27 DIAGNOSIS — Z21 Asymptomatic human immunodeficiency virus [HIV] infection status: Secondary | ICD-10-CM

## 2015-10-27 DIAGNOSIS — B20 Human immunodeficiency virus [HIV] disease: Secondary | ICD-10-CM

## 2015-10-27 DIAGNOSIS — E291 Testicular hypofunction: Secondary | ICD-10-CM | POA: Diagnosis not present

## 2015-10-27 DIAGNOSIS — N528 Other male erectile dysfunction: Secondary | ICD-10-CM | POA: Diagnosis not present

## 2015-10-27 LAB — CBC WITH DIFFERENTIAL/PLATELET
BASOS ABS: 0 10*3/uL (ref 0.0–0.1)
BASOS PCT: 0.4 % (ref 0.0–3.0)
EOS ABS: 0 10*3/uL (ref 0.0–0.7)
Eosinophils Relative: 1 % (ref 0.0–5.0)
HEMATOCRIT: 47.3 % (ref 39.0–52.0)
Hemoglobin: 16.6 g/dL (ref 13.0–17.0)
LYMPHS PCT: 35.3 % (ref 12.0–46.0)
Lymphs Abs: 1.5 10*3/uL (ref 0.7–4.0)
MCHC: 35.1 g/dL (ref 30.0–36.0)
MCV: 86.8 fl (ref 78.0–100.0)
MONO ABS: 0.4 10*3/uL (ref 0.1–1.0)
Monocytes Relative: 8.7 % (ref 3.0–12.0)
NEUTROS ABS: 2.3 10*3/uL (ref 1.4–7.7)
NEUTROS PCT: 54.6 % (ref 43.0–77.0)
Platelets: 150 10*3/uL (ref 150.0–400.0)
RBC: 5.45 Mil/uL (ref 4.22–5.81)
RDW: 14.9 % (ref 11.5–15.5)
WBC: 4.1 10*3/uL (ref 4.0–10.5)

## 2015-10-27 LAB — LIPID PANEL
Cholesterol: 248 mg/dL — ABNORMAL HIGH (ref 0–200)
HDL: 81.5 mg/dL (ref >39.00)
LDL CALC: 148 mg/dL — AB (ref 0–99)
NONHDL: 166.12
Total CHOL/HDL Ratio: 3
Triglycerides: 90 mg/dL (ref 0.0–149.0)
VLDL: 18 mg/dL (ref 0.0–40.0)

## 2015-10-27 LAB — URINALYSIS
BILIRUBIN URINE: NEGATIVE
Hgb urine dipstick: NEGATIVE
KETONES UR: NEGATIVE
LEUKOCYTES UA: NEGATIVE
Nitrite: NEGATIVE
PH: 6.5 (ref 5.0–8.0)
Specific Gravity, Urine: 1.01 (ref 1.000–1.030)
TOTAL PROTEIN, URINE-UPE24: NEGATIVE
URINE GLUCOSE: NEGATIVE
Urobilinogen, UA: 0.2 (ref 0.0–1.0)

## 2015-10-27 LAB — HEPATIC FUNCTION PANEL
ALBUMIN: 4.3 g/dL (ref 3.5–5.2)
ALK PHOS: 37 U/L — AB (ref 39–117)
ALT: 14 U/L (ref 0–53)
AST: 17 U/L (ref 0–37)
BILIRUBIN DIRECT: 0.1 mg/dL (ref 0.0–0.3)
TOTAL PROTEIN: 6.2 g/dL (ref 6.0–8.3)
Total Bilirubin: 0.7 mg/dL (ref 0.2–1.2)

## 2015-10-27 LAB — BASIC METABOLIC PANEL
BUN: 14 mg/dL (ref 6–23)
CALCIUM: 9.2 mg/dL (ref 8.4–10.5)
CHLORIDE: 97 meq/L (ref 96–112)
CO2: 33 meq/L — AB (ref 19–32)
CREATININE: 1.22 mg/dL (ref 0.40–1.50)
GFR: 63.22 mL/min (ref >60.00)
GLUCOSE: 101 mg/dL — AB (ref 70–99)
Potassium: 4 mEq/L (ref 3.5–5.1)
Sodium: 136 mEq/L (ref 135–145)

## 2015-10-27 LAB — TESTOSTERONE: Testosterone: 112.76 ng/dL — ABNORMAL LOW (ref 300.00–890.00)

## 2015-10-27 LAB — PSA: PSA: 1.06 ng/mL (ref 0.10–4.00)

## 2015-10-27 LAB — TSH: TSH: 2.24 u[IU]/mL (ref 0.35–4.50)

## 2015-10-27 MED ORDER — "SYRINGE/NEEDLE (DISP) 23G X 1-1/2"" 3 ML MISC": Status: DC

## 2015-10-27 NOTE — Assessment & Plan Note (Signed)
2017 He will check re: wt loss w/Dr Allen Norris re: study drugs and wt loss

## 2015-10-27 NOTE — Assessment & Plan Note (Signed)
Here for medicare wellness/physical  Diet: heart healthy  Physical activity: not sedentary - walking 5 mi/d at 5 am Depression/mood screen: negative  Hearing: intact to whispered voice  Visual acuity: grossly normal, performs annual eye exam  ADLs: capable  Fall risk: low to none  Home safety: good  Cognitive evaluation: intact to orientation, naming, recall and repetition  EOL planning: adv directives, full code/ I agree  I have personally reviewed and have noted  1. The patient's medical, surgical and social history  2. Their use of alcohol, tobacco or illicit drugs  3. Their current medications and supplements  4. The patient's functional ability including ADL's, fall risks, home safety risks and hearing or visual impairment.  5. Diet and physical activities  6. Evidence for depression or mood disorders 7. The roster of all physicians providing medical care to patient - is listed in the Snapshot section of the chart and reviewed today.    Today patient counseled on age appropriate routine health concerns for screening and prevention, each reviewed and up to date or declined. Immunizations reviewed and up to date or declined. Labs ordered and reviewed. Risk factors for depression reviewed and negative. Hearing function and visual acuity are intact. ADLs screened and addressed as needed. Functional ability and level of safety reviewed and appropriate. Education, counseling and referrals performed based on assessed risks today. Patient provided with a copy of personalized plan for preventive services.   colonoscopy Jan '03, 2015 Immunizations: Tetanus June '10; Shingles vaccine Jan '11 

## 2015-10-27 NOTE — Progress Notes (Signed)
Pre visit review using our clinic review tool, if applicable. No additional management support is needed unless otherwise documented below in the visit note. 

## 2015-10-27 NOTE — Assessment & Plan Note (Signed)
Followed in Clinic at [facility] by Dr. Allen Norris - in a study 2016-17. He will check re: wt loss w/Dr Allen Norris

## 2015-10-27 NOTE — Patient Instructions (Signed)

## 2015-10-27 NOTE — Assessment & Plan Note (Signed)
Cialis po  

## 2015-10-27 NOTE — Progress Notes (Signed)
Subjective:  Patient ID: Troy Marsh, male    DOB: [birth year]  Age: 65 y.o. MRN: [id number]  CC: No chief complaint on file.   HPI DEMETRIAS TWOREK presents for a well exam  Outpatient Prescriptions Prior to Visit  Medication Sig Dispense Refill  . aspirin 81 MG tablet Take 81 mg by mouth daily.      . celecoxib (CELEBREX) 200 MG capsule Take 1 capsule (200 mg total) by mouth 2 (two) times daily. 180 capsule 3  . hydrochlorothiazide (HYDRODIURIL) 50 MG tablet Take 1 tablet by mouth  daily 90 tablet 3  . HYDROcodone-acetaminophen (NORCO) 7.5-325 MG per tablet Take 1 tablet by mouth daily. Patient states he takes 1/2 to 1 tablet daily.    . hydrocortisone (ANUSOL-HC) 25 MG suppository Place 1 suppository (25 mg total) rectally at bedtime as needed for hemorrhoids or itching. 30 suppository 3  . Melatonin 5 MG TABS Take by mouth.      . multivitamin (THERAGRAN) per tablet Take 1 tablet by mouth daily.      . SYRINGE-NEEDLE, DISP, 3 ML (B-D 3CC LUER-LOK SYR 23GX1-1/2) 23G X 1-1/2" 3 ML MISC USE 1 SYRINGE EVERY 2     WEEKS AS DIRECTED WITH    TESTOSTERONE 18 each 3  . tadalafil (CIALIS) 5 MG tablet TAKE 1 TABLET EVERY DAY 90 tablet 3  . terbinafine (LAMISIL) 1 % cream Apply 1 application topically 2 (two) times daily.    [name] testosterone enanthate (DELATESTRYL) 200 MG/ML injection Inject 1 mL (200 mg total) into the muscle every 14 (fourteen) days. 15 mL 5  . Alprostadil, Vasodilator, (CAVERJECT) 20 MCG SOLR USE CAVERJECT 20 MCG IMPULSE BOX, EACH BOX 2 INJECTIONS, USE AS DIRECTED (Patient not taking: Reported on 10/27/2015) 108 each 3  . CAVERJECT 20 MCG SOLR INJECT 20MCG DAILY (Patient not taking: Reported on 10/27/2015) 6 each 1  . CAVERJECT IMPULSE 20 MCG injection Reported on 10/27/2015    . efavirenz-emtrictabine-tenofovir (ATRIPLA) 600-200-300 MG per tablet Take 1 tablet by mouth at bedtime. Reported on 10/27/2015    . fluorouracil (EFUDEX) 5 % cream Apply topically 2 (two) times daily.  Reported on 10/27/2015    . NON FORMULARY Reported on 10/27/2015    . SAFETY-LOK INSULIN SYR 1CC/29G 29G X 1/2" 1 ML MISC USE 1 SYRINGE FOR         INJECTION INTO THE SKIN   TWICE A WEEK (Patient not taking: Reported on 10/27/2015) 10 each 10  . triamcinolone cream (KENALOG) 0.1 % Apply 1 application topically 2 (two) times daily. Reported on 10/27/2015    . Zinc 50 MG TABS Take 1 tablet by mouth daily. Reported on 10/27/2015     No facility-administered medications prior to visit.    ROS Review of Systems  Constitutional: Positive for unexpected weight change. Negative for appetite change and fatigue.  HENT: Negative for congestion, nosebleeds, sneezing, sore throat and trouble swallowing.   Eyes: Negative for itching and visual disturbance.  Respiratory: Negative for cough.   Cardiovascular: Negative for chest pain, palpitations and leg swelling.  Gastrointestinal: Negative for nausea, diarrhea, blood in stool and abdominal distention.  Genitourinary: Negative for frequency and hematuria.  Musculoskeletal: Negative for back pain, joint swelling, gait problem and neck pain.  Skin: Negative for rash.  Neurological: Negative for dizziness, tremors, speech difficulty and weakness.  Psychiatric/Behavioral: Negative for suicidal ideas, sleep disturbance, dysphoric mood and agitation. The patient is not nervous/anxious.     Objective:  BP 120/80  mmHg  Pulse 55  Ht 5\' 10"  (1.778 m)  Wt 149 lb (67.586 kg)  BMI 21.38 kg/m2  SpO2 98%  BP Readings from Last 3 Encounters:  10/27/15 120/80  09/23/15 149/101  08/16/15 165/75    Wt Readings from Last 3 Encounters:  10/27/15 149 lb (67.586 kg)  05/24/15 159 lb 9.6 oz (72.394 kg)  04/12/15 155 lb (70.308 kg)    Physical Exam  Constitutional: He is oriented to person, place, and time. He appears well-developed. No distress.  NAD  HENT:  Mouth/Throat: Oropharynx is clear and moist.  Eyes: Conjunctivae are normal. Pupils are equal, round,  and reactive to light.  Neck: Normal range of motion. No JVD present. No thyromegaly present.  Cardiovascular: Normal rate, regular rhythm, normal heart sounds and intact distal pulses.  Exam reveals no gallop and no friction rub.   No murmur heard. Pulmonary/Chest: Effort normal and breath sounds normal. No respiratory distress. He has no wheezes. He has no rales. He exhibits no tenderness.  Abdominal: Soft. Bowel sounds are normal. He exhibits no distension and no mass. There is no tenderness. There is no rebound and no guarding.  Musculoskeletal: Normal range of motion. He exhibits no edema or tenderness.  Lymphadenopathy:    He has no cervical adenopathy.  Neurological: He is alert and oriented to person, place, and time. He has normal reflexes. No cranial nerve deficit. He exhibits normal muscle tone. He displays a negative Romberg sign. Coordination and gait normal.  Skin: Skin is warm and dry. No rash noted.  Psychiatric: He has a normal mood and affect. His behavior is normal. Judgment and thought content normal.    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 The more distal radicular pain has been well treated by recent injections. The left paraspinous pain has recurred. 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 FLUOROSCOPY TIME:  67.62 uGy*m2 PROCEDURE: The procedure, risks, benefits, and alternatives were explained to the patient.  Questions regarding the procedure were encouraged and answered. The patient understands and consents to the procedure. LUMBAR EPIDURAL INJECTION: An interlaminar approach was performed on left at L4-5. The overlying skin was cleansed and anesthetized. A 20 gauge Crawford epidural needle was advanced using loss-of-resistance technique. DIAGNOSTIC EPIDURAL INJECTION: Injection of Isovue-M 200 shows a good epidural pattern with spread above and below the level of needle placement, primarily on the left no vascular opacification is seen. THERAPEUTIC EPIDURAL INJECTION: 120 Mg of Depo-Medrol mixed with 3 mL 1% lidocaine were instilled. The procedure was well-tolerated, and the patient was discharged thirty minutes following the injection in good condition. COMPLICATIONS: None IMPRESSION: Technically successful epidural injection on the left L4-5 # 3 Electronically Signed   By: San Morelle M.D.   On: 09/23/2015 09:00    Assessment & Plan:   There  are no diagnoses linked to this encounter. I am having Mr. Drennon maintain his aspirin, efavirenz-emtricitabine-tenofovir, NON FORMULARY, Melatonin, multivitamin, Zinc, HYDROcodone-acetaminophen, Alprostadil (Vasodilator), SYRINGE-NEEDLE (DISP) 3 ML, celecoxib, CAVERJECT IMPULSE, fluorouracil, terbinafine, triamcinolone cream, SAFETY-LOK INSULIN SYR 1CC/29G, tadalafil, hydrochlorothiazide, testosterone enanthate, hydrocortisone, CAVERJECT, baclofen, and RESTASIS.  Meds ordered this encounter  Medications  . baclofen (LIORESAL) 10 MG tablet    Sig: Take 1 tablet by mouth 2 (two) times daily as needed.    Refill:  0  . RESTASIS 0.05 % ophthalmic emulsion    Sig: Place 1 drop into both eyes 2 (two) times daily.     Follow-up: No Follow-up on file.  [name], MD

## 2015-11-01 ENCOUNTER — Other Ambulatory Visit: Payer: Self-pay

## 2015-11-01 ENCOUNTER — Telehealth: Payer: Self-pay

## 2015-11-01 NOTE — Telephone Encounter (Signed)
Ok x 6 months Thx 

## 2015-11-01 NOTE — Telephone Encounter (Signed)
Please advise patient is requesting refill on testosterone

## 2015-11-02 ENCOUNTER — Other Ambulatory Visit: Payer: Self-pay

## 2015-11-02 MED ORDER — TESTOSTERONE ENANTHATE 200 MG/ML IM SOLN: 200.0000 mg | INTRAMUSCULAR | Status: DC

## 2015-11-05 ENCOUNTER — Encounter: Payer: Self-pay | Admitting: Internal Medicine

## 2015-11-05 MED ORDER — TESTOSTERONE CYPIONATE 200 MG/ML IM SOLN: 200.0000 mg | INTRAMUSCULAR | Status: DC

## 2015-11-05 NOTE — Telephone Encounter (Signed)
Pt left msg on triage requesting a rx to be sent to [facility] for 90 on his Testosterone. MD ok rx 11/01/15 not sure if was taking care of. Printed script faing to mail service. Notified pt rx has been faxed...[name]

## 2015-11-05 NOTE — Addendum Note (Signed)
Addended by: Earnstine Regal on: 11/05/2015 10:27 AM   Modules accepted: Orders, Medications

## 2015-11-08 NOTE — Telephone Encounter (Signed)
Rx to go to [facility]. Preferred pharmacy updated.

## 2015-11-09 ENCOUNTER — Telehealth: Payer: Self-pay | Admitting: *Deleted

## 2015-11-09 NOTE — Telephone Encounter (Signed)
Called [name] spoke w/pharmacy gave her instructions on testosterone which she stated they have received...Troy Marsh

## 2015-11-09 NOTE — Telephone Encounter (Signed)
Left msg on triage needing to verify rx for testosterone that was sent on. Pls call back w/referrence # [id number]...[name]

## 2015-11-09 NOTE — Telephone Encounter (Signed)
Rx was faxed to [name] on 11/05/15...[name]

## 2015-11-23 ENCOUNTER — Telehealth: Payer: Self-pay

## 2015-11-23 MED ORDER — HYDROCORTISONE ACETATE 25 MG RE SUPP: 25.0000 mg | Freq: Every evening | RECTAL | Status: DC | PRN

## 2015-11-23 NOTE — Telephone Encounter (Signed)
Sent Anusol hc suppositories through [facility] per patient's request

## 2015-12-14 ENCOUNTER — Other Ambulatory Visit: Payer: Self-pay | Admitting: Orthopedic Surgery

## 2015-12-14 DIAGNOSIS — G8929 Other chronic pain: Secondary | ICD-10-CM

## 2015-12-14 DIAGNOSIS — M545 Low back pain, unspecified: Secondary | ICD-10-CM

## 2015-12-23 ENCOUNTER — Ambulatory Visit
Admission: RE | Admit: 2015-12-23 | Discharge: 2015-12-23 | Disposition: A | Discharge status: Home / Self Care | Payer: Medicare Other | Source: Ambulatory Visit | Attending: Orthopedic Surgery | Admitting: Orthopedic Surgery

## 2015-12-23 DIAGNOSIS — M545 Low back pain, unspecified: Secondary | ICD-10-CM

## 2015-12-23 DIAGNOSIS — G8929 Other chronic pain: Secondary | ICD-10-CM

## 2015-12-23 MED ORDER — IOPAMIDOL (ISOVUE-M 200) INJECTION 41%
1.0000 mL | Freq: Once | INTRAMUSCULAR | Status: AC
  Administered 2015-12-23: 1 mL via EPIDURAL

## 2015-12-23 MED ORDER — METHYLPREDNISOLONE ACETATE 40 MG/ML INJ SUSP (RADIOLOG
120.0000 mg | Freq: Once | INTRAMUSCULAR | Status: AC
  Administered 2015-12-23: 120 mg via EPIDURAL

## 2016-01-16 ENCOUNTER — Other Ambulatory Visit: Payer: Self-pay | Admitting: Internal Medicine

## 2016-01-17 ENCOUNTER — Other Ambulatory Visit: Payer: Self-pay | Admitting: Internal Medicine

## 2016-01-17 MED ORDER — TESTOSTERONE CYPIONATE 200 MG/ML IM SOLN: 200.0000 mg | INTRAMUSCULAR | Status: DC

## 2016-01-18 ENCOUNTER — Encounter: Payer: Self-pay | Admitting: Internal Medicine

## 2016-01-19 ENCOUNTER — Other Ambulatory Visit: Payer: Self-pay

## 2016-01-19 MED ORDER — HYDROCORTISONE ACETATE 25 MG RE SUPP: 25.0000 mg | Freq: Every evening | RECTAL | Status: DC | PRN

## 2016-01-19 NOTE — Telephone Encounter (Signed)
See med list

## 2016-03-01 ENCOUNTER — Other Ambulatory Visit: Payer: Self-pay | Admitting: Orthopedic Surgery

## 2016-03-01 DIAGNOSIS — M545 Low back pain, unspecified: Secondary | ICD-10-CM

## 2016-03-01 DIAGNOSIS — G8929 Other chronic pain: Secondary | ICD-10-CM

## 2016-03-08 ENCOUNTER — Ambulatory Visit
Admission: RE | Admit: 2016-03-08 | Discharge: 2016-03-08 | Disposition: A | Discharge status: Home / Self Care | Payer: Medicare Other | Source: Ambulatory Visit | Attending: Orthopedic Surgery | Admitting: Orthopedic Surgery

## 2016-03-08 DIAGNOSIS — M545 Low back pain, unspecified: Secondary | ICD-10-CM

## 2016-03-08 DIAGNOSIS — G8929 Other chronic pain: Secondary | ICD-10-CM

## 2016-03-08 MED ORDER — IOPAMIDOL (ISOVUE-M 200) INJECTION 41%
1.0000 mL | Freq: Once | INTRAMUSCULAR | Status: AC
Start: 2016-03-08 — End: 2016-03-08
  Administered 2016-03-08: 1 mL via EPIDURAL

## 2016-03-08 MED ORDER — METHYLPREDNISOLONE ACETATE 40 MG/ML INJ SUSP (RADIOLOG
120.0000 mg | Freq: Once | INTRAMUSCULAR | Status: AC
  Administered 2016-03-08: 120 mg via EPIDURAL

## 2016-03-09 ENCOUNTER — Other Ambulatory Visit: Payer: Self-pay | Admitting: Orthopedic Surgery

## 2016-03-09 DIAGNOSIS — M545 Low back pain, unspecified: Secondary | ICD-10-CM

## 2016-03-09 DIAGNOSIS — G8929 Other chronic pain: Secondary | ICD-10-CM

## 2016-03-14 ENCOUNTER — Other Ambulatory Visit: Payer: Self-pay | Admitting: *Deleted

## 2016-03-14 MED ORDER — CELECOXIB 200 MG PO CAPS: 200.0000 mg | ORAL_CAPSULE | Freq: Two times a day (BID) | ORAL | 1 refills | Status: DC

## 2016-03-15 ENCOUNTER — Telehealth: Payer: Self-pay | Admitting: *Deleted

## 2016-03-15 NOTE — Telephone Encounter (Signed)
Rec'd fax pt requesting refill on Testosterone Cypionate 90 day supply...[name]

## 2016-03-16 MED ORDER — TESTOSTERONE CYPIONATE 200 MG/ML IM SOLN: 200.0000 mg | INTRAMUSCULAR | 1 refills | Status: DC

## 2016-03-16 NOTE — Telephone Encounter (Signed)
Updated chart, called refill into [facility] spoke with pharmacist Jenny Reichmann gave authorization to fill...Troy Marsh

## 2016-03-16 NOTE — Telephone Encounter (Signed)
OK. Thx

## 2016-03-18 ENCOUNTER — Other Ambulatory Visit: Payer: Self-pay | Admitting: Internal Medicine

## 2016-04-12 ENCOUNTER — Other Ambulatory Visit: Payer: Self-pay | Admitting: Internal Medicine

## 2016-04-24 ENCOUNTER — Ambulatory Visit (INDEPENDENT_AMBULATORY_CARE_PROVIDER_SITE_OTHER): Payer: Medicare Other | Admitting: Internal Medicine

## 2016-04-24 ENCOUNTER — Encounter: Payer: Self-pay | Admitting: Internal Medicine

## 2016-04-24 DIAGNOSIS — B20 Human immunodeficiency virus [HIV] disease: Secondary | ICD-10-CM | POA: Diagnosis not present

## 2016-04-24 DIAGNOSIS — M545 Low back pain: Secondary | ICD-10-CM | POA: Diagnosis not present

## 2016-04-24 DIAGNOSIS — G8929 Other chronic pain: Secondary | ICD-10-CM

## 2016-04-24 DIAGNOSIS — E291 Testicular hypofunction: Secondary | ICD-10-CM | POA: Diagnosis not present

## 2016-04-24 DIAGNOSIS — Z23 Encounter for immunization: Secondary | ICD-10-CM

## 2016-04-24 MED ORDER — ALPROSTADIL (VASODILATOR) 20 MCG IC KIT: 20.0000 ug | PACK | INTRACAVERNOUS | 3 refills | Status: DC | PRN

## 2016-04-24 MED ORDER — VITAMIN D 1000 UNITS PO TABS: 1000.0000 [IU] | ORAL_TABLET | Freq: Every day | ORAL | 3 refills | Status: AC

## 2016-04-24 MED ORDER — ALPROSTADIL (VASODILATOR) 20 MCG IC SOLR: INTRACAVERNOUS | 3 refills | Status: DC

## 2016-04-24 NOTE — Addendum Note (Signed)
Addended by: Cresenciano Lick on: 04/24/2016 10:19 AM   Modules accepted: Orders

## 2016-04-24 NOTE — Assessment & Plan Note (Signed)
Celebrex, Baclofen, injections at [facility] q 2 mo Norco prn  Potential benefits of a long term opioids use as well as potential risks (i.e. addiction risk, apnea etc) and complications (i.e. Somnolence, constipation and others) were explained to the patient and were aknowledged.

## 2016-04-24 NOTE — Assessment & Plan Note (Signed)
Testosterone replacment q 2 weeks IM and using caverject ? Potential benefits of a long term testosterone use as well as potential risks  and complications were explained to the patient and were aknowledged. ?Testosterone levels are low however it is working clinically - no dose change ?

## 2016-04-24 NOTE — Assessment & Plan Note (Signed)
On [place] 

## 2016-04-24 NOTE — Assessment & Plan Note (Signed)
On Cialis and Caverjects prn 

## 2016-04-24 NOTE — Progress Notes (Signed)
Pre visit review using our clinic review tool, if applicable. No additional management support is needed unless otherwise documented below in the visit note. 

## 2016-04-24 NOTE — Progress Notes (Signed)
Subjective:  Patient ID: Troy Marsh, male    DOB: 1950-07-27  Age: 65 y.o. MRN: [id number]  CC: No chief complaint on file.   HPI Troy Marsh presents for dyslipidemia, ED, OA, hypogonadism f/u  Outpatient Medications Prior to Visit  Medication Sig Dispense Refill  . Alprostadil, Vasodilator, (CAVERJECT) 20 MCG SOLR USE CAVERJECT 20 MCG IMPULSE BOX, EACH BOX 2 INJECTIONS, USE AS DIRECTED 108 each 3  . ANUCORT-HC 25 MG suppository PLACE 1 SUPPOSITORY (25 MG  TOTAL) RECTALLY AT BEDTIME  AS NEEDED FOR HEMORRHOIDS  OR ITCHING. 100 suppository 1  . aspirin 81 MG tablet Take 81 mg by mouth daily.      [name] atorvastatin (LIPITOR) 40 MG tablet Take 40 mg by mouth.    . baclofen (LIORESAL) 10 MG tablet Take 1 tablet by mouth 2 (two) times daily as needed.  0  . celecoxib (CELEBREX) 200 MG capsule Take 1 capsule (200 mg total) by mouth 2 (two) times daily. 180 capsule 1  . hydrochlorothiazide (HYDRODIURIL) 50 MG tablet Take 1 tablet by mouth  daily 90 tablet 3  . HYDROcodone-acetaminophen (NORCO) 7.5-325 MG per tablet Take 1 tablet by mouth daily. Patient states he takes 1/2 to 1 tablet daily.    . Melatonin 5 MG TABS Take by mouth.      . multivitamin (THERAGRAN) per tablet Take 1 tablet by mouth daily.      . NON FORMULARY Reported on 10/27/2015    . RESTASIS 0.05 % ophthalmic emulsion Place 1 drop into both eyes 2 (two) times daily.    . SYRINGE-NEEDLE, DISP, 3 ML (B-D 3CC LUER-LOK SYR 23GX1-1/2) 23G X 1-1/2" 3 ML MISC USE 1 SYRINGE EVERY 2     WEEKS AS DIRECTED WITH    TESTOSTERONE 100 each 3  . tadalafil (CIALIS) 5 MG tablet TAKE 1 TABLET EVERY DAY 90 tablet 3  . Zinc 50 MG TABS Take 1 tablet by mouth daily. Reported on 10/27/2015    . CAVERJECT IMPULSE 20 MCG injection Reported on 10/27/2015    . efavirenz-emtrictabine-tenofovir (ATRIPLA) 600-200-300 MG per tablet Take 1 tablet by mouth at bedtime. Reported on 10/27/2015    . terbinafine (LAMISIL) 1 % cream Apply 1 application  topically 2 (two) times daily.    [name] testosterone cypionate (DEPO-TESTOSTERONE) 200 MG/ML injection Inject 1 mL (200 mg total) into the muscle every 14 (fourteen) days. 60 mL 1  . triamcinolone cream (KENALOG) 0.1 % Apply 1 application topically 2 (two) times daily. Reported on 10/27/2015    . CAVERJECT 20 MCG SOLR INJECT 20MCG DAILY (Patient not taking: Reported on 04/24/2016) 6 each 1  . SAFETY-LOK INSULIN SYR 1CC/29G 29G X 1/2" 1 ML MISC USE 1 SYRINGE FOR         INJECTION INTO THE SKIN   TWICE A WEEK (Patient not taking: Reported on 04/24/2016) 10 each 10   No facility-administered medications prior to visit.     ROS Review of Systems  Constitutional: Negative for appetite change, fatigue and unexpected weight change.  HENT: Negative for congestion, nosebleeds, sneezing, sore throat and trouble swallowing.   Eyes: Negative for itching and visual disturbance.  Respiratory: Negative for cough.   Cardiovascular: Negative for chest pain, palpitations and leg swelling.  Gastrointestinal: Negative for abdominal distention, blood in stool, diarrhea and nausea.  Genitourinary: Negative for frequency and hematuria.  Musculoskeletal: Positive for back pain. Negative for gait problem, joint swelling and neck pain.  Skin: Negative for rash.  Neurological: Negative for dizziness, tremors, speech difficulty and weakness.  Psychiatric/Behavioral: Negative for agitation, dysphoric mood and sleep disturbance. The patient is not nervous/anxious.     Objective:  BP 120/82   Pulse 65   Temp 97.5 F (36.4 C) (Oral)   Wt 152 lb (68.9 kg)   SpO2 98%   BMI 21.81 kg/m   BP Readings from Last 3 Encounters:  04/24/16 120/82  03/08/16 133/75  12/23/15 (!) 148/79    Wt Readings from Last 3 Encounters:  04/24/16 152 lb (68.9 kg)  10/27/15 149 lb (67.6 kg)  05/24/15 159 lb 9.6 oz (72.4 kg)    Physical Exam  Constitutional: He is oriented to person, place, and time. He appears well-developed. No  distress.  NAD  HENT:  Mouth/Throat: Oropharynx is clear and moist.  Eyes: Conjunctivae are normal. Pupils are equal, round, and reactive to light.  Neck: Normal range of motion. No JVD present. No thyromegaly present.  Cardiovascular: Normal rate, regular rhythm, normal heart sounds and intact distal pulses.  Exam reveals no gallop and no friction rub.   No murmur heard. Pulmonary/Chest: Effort normal and breath sounds normal. No respiratory distress. He has no wheezes. He has no rales. He exhibits no tenderness.  Abdominal: Soft. Bowel sounds are normal. He exhibits no distension and no mass. There is no tenderness. There is no rebound and no guarding.  Musculoskeletal: Normal range of motion. He exhibits tenderness. He exhibits no edema.  Lymphadenopathy:    He has no cervical adenopathy.  Neurological: He is alert and oriented to person, place, and time. He has normal reflexes. No cranial nerve deficit. He exhibits normal muscle tone. He displays a negative Romberg sign. Coordination and gait normal.  Skin: Skin is warm and dry. No rash noted.  Psychiatric: He has a normal mood and affect. His behavior is normal. Judgment and thought content normal.    Lab Results  Component Value Date   WBC 4.1 10/27/2015   HGB 16.6 10/27/2015   HCT 47.3 10/27/2015   PLT 150.0 10/27/2015   GLUCOSE 101 (H) 10/27/2015   CHOL 248 (H) 10/27/2015   TRIG 90.0 10/27/2015   HDL 81.50 10/27/2015   LDLDIRECT 113.3 05/21/2006   LDLCALC 148 (H) 10/27/2015   ALT 14 10/27/2015   AST 17 10/27/2015   NA 136 10/27/2015   K 4.0 10/27/2015   CL 97 10/27/2015   CREATININE 1.22 10/27/2015   BUN 14 10/27/2015   CO2 33 (H) 10/27/2015   TSH 2.24 10/27/2015   PSA 1.06 10/27/2015    Dg Inject Diag/thera/inc Needle/cath/plc Epi/lumb/sac W/img  Result Date: 03/08/2016 CLINICAL DATA:  Spondylosis without myelopathy. Left leg pain. Good response to previous injections but with recurrence of symptoms. FLUOROSCOPY  TIME:  0 minutes 39 seconds. 60.88 micro gray meter squared PROCEDURE: The procedure, risks, benefits, and alternatives were explained to the patient. Questions regarding the procedure were encouraged and answered. The patient understands and consents to the procedure. LUMBAR EPIDURAL INJECTION: An interlaminar approach was performed on left at L4-5. The overlying skin was cleansed and anesthetized. A 20 gauge epidural needle was advanced using loss-of-resistance technique. DIAGNOSTIC EPIDURAL INJECTION: Injection of Isovue-M 200 shows a good epidural pattern with spread above and below the level of needle placement, primarily on the left, but to both sides. No vascular opacification is seen. THERAPEUTIC EPIDURAL INJECTION: One hundred twenty Mg of Depo-Medrol mixed with 2 cc 1% lidocaine were instilled. The procedure was well-tolerated, and the patient was discharged  thirty minutes following the injection in good condition. COMPLICATIONS: None IMPRESSION: Technically successful epidural injection on the left at L4-5. Electronically Signed   By: Nelson Chimes M.D.   On: 03/08/2016 13:47    Assessment & Plan:   There are no diagnoses linked to this encounter. I have discontinued Mr. Forgette SAFETY-LOK INSULIN SYR 1CC/29G and CAVERJECT. I am also having him maintain his aspirin, efavirenz-emtricitabine-tenofovir, NON FORMULARY, Melatonin, multivitamin, Zinc, HYDROcodone-acetaminophen, Alprostadil (Vasodilator), CAVERJECT IMPULSE, terbinafine, triamcinolone cream, tadalafil, baclofen, RESTASIS, SYRINGE-NEEDLE (DISP) 3 ML, atorvastatin, celecoxib, testosterone cypionate, hydrochlorothiazide, ANUCORT-HC, and emtricitabine-rilpivir-tenofovir AF.  Meds ordered this encounter  Medications  . emtricitabine-rilpivir-tenofovir AF (ODEFSEY) 200-25-25 MG TABS tablet    Sig: Take 1 tablet by mouth daily.     Follow-up: No Follow-up on file.  [name], MD

## 2016-04-25 ENCOUNTER — Telehealth: Payer: Self-pay | Admitting: *Deleted

## 2016-04-25 NOTE — Telephone Encounter (Signed)
[facility] left msg on triage stating needing to clarify which medication is suppose to be taking. Rec'd script for Caverject on yesterday, and also on file pt is taking testosterone cyponiate. Will need specific directions for Caverject impulse box...[name]

## 2016-04-26 MED ORDER — ALPROSTADIL (VASODILATOR) 20 MCG IC SOLR: INTRACAVERNOUS | 3 refills | Status: DC

## 2016-04-26 MED ORDER — "SYRINGE/NEEDLE (DISP) 27G X 1/2"" 1 ML MISC": 0 refills | Status: DC

## 2016-04-26 NOTE — Telephone Encounter (Signed)
OK - done Thx 

## 2016-04-26 NOTE — Telephone Encounter (Signed)
Notified [facility] w/rx for Caverject. Also sent required syringes per pharmacist Jenny Reichmann that is needed for med...[name]

## 2016-04-27 ENCOUNTER — Ambulatory Visit: Payer: Medicare Other | Admitting: Internal Medicine

## 2016-04-28 ENCOUNTER — Telehealth: Payer: Self-pay | Admitting: *Deleted

## 2016-04-28 MED ORDER — STERILE WATER FOR INJECTION IJ SOLN: INTRAMUSCULAR | 1 refills | Status: DC

## 2016-04-28 NOTE — Telephone Encounter (Signed)
Left msg on triage stating needing rx for bacterial static water/sterile water sol to dilute caverject. Received supplies but not water. Ref# [id number].../lmb Called [facility] spoke w/Shane gave authorization for sterile water...Troy Marsh

## 2016-05-03 ENCOUNTER — Other Ambulatory Visit: Payer: Self-pay | Admitting: Internal Medicine

## 2016-05-04 ENCOUNTER — Other Ambulatory Visit: Payer: Self-pay | Admitting: Internal Medicine

## 2016-05-04 NOTE — Telephone Encounter (Signed)
Called [facility] spoke w/susan cook pharmacist gave MD approval on yesterday...[name]

## 2016-05-04 NOTE — Telephone Encounter (Signed)
Rec'd call pt states his testosterone was faxed into the wrong pharmacy. Rx should have went to his mail service and not local [facility]. Requesting refill to be called into [facility].

## 2016-05-09 ENCOUNTER — Encounter: Payer: Self-pay | Admitting: Internal Medicine

## 2016-05-19 ENCOUNTER — Other Ambulatory Visit: Payer: Self-pay | Admitting: Internal Medicine

## 2016-05-25 ENCOUNTER — Ambulatory Visit
Admission: RE | Admit: 2016-05-25 | Discharge: 2016-05-25 | Disposition: A | Discharge status: Home / Self Care | Payer: Medicare Other | Source: Ambulatory Visit | Attending: Orthopedic Surgery | Admitting: Orthopedic Surgery

## 2016-05-25 DIAGNOSIS — G8929 Other chronic pain: Secondary | ICD-10-CM

## 2016-05-25 DIAGNOSIS — M545 Low back pain, unspecified: Secondary | ICD-10-CM

## 2016-05-25 MED ORDER — METHYLPREDNISOLONE ACETATE 40 MG/ML INJ SUSP (RADIOLOG
120.0000 mg | Freq: Once | INTRAMUSCULAR | Status: AC
  Administered 2016-05-25: 120 mg via EPIDURAL

## 2016-05-25 MED ORDER — IOPAMIDOL (ISOVUE-M 200) INJECTION 41%
10.0000 mL | Freq: Once | INTRAMUSCULAR | Status: AC
  Administered 2016-05-25: 10 mL via EPIDURAL

## 2016-06-04 ENCOUNTER — Emergency Department (HOSPITAL_BASED_OUTPATIENT_CLINIC_OR_DEPARTMENT_OTHER)
Admission: EM | Admit: 2016-06-04 | Discharge: 2016-06-04 | Disposition: A | Discharge status: Home / Self Care | Payer: Medicare Other | Attending: Emergency Medicine | Admitting: Emergency Medicine

## 2016-06-04 ENCOUNTER — Encounter (HOSPITAL_BASED_OUTPATIENT_CLINIC_OR_DEPARTMENT_OTHER): Payer: Self-pay | Admitting: Emergency Medicine

## 2016-06-04 DIAGNOSIS — R200 Anesthesia of skin: Secondary | ICD-10-CM | POA: Diagnosis not present

## 2016-06-04 DIAGNOSIS — Z87891 Personal history of nicotine dependence: Secondary | ICD-10-CM | POA: Diagnosis not present

## 2016-06-04 DIAGNOSIS — Z21 Asymptomatic human immunodeficiency virus [HIV] infection status: Secondary | ICD-10-CM | POA: Diagnosis not present

## 2016-06-04 DIAGNOSIS — M792 Neuralgia and neuritis, unspecified: Secondary | ICD-10-CM

## 2016-06-04 DIAGNOSIS — R202 Paresthesia of skin: Secondary | ICD-10-CM

## 2016-06-04 DIAGNOSIS — M79601 Pain in right arm: Secondary | ICD-10-CM

## 2016-06-04 DIAGNOSIS — Z7982 Long term (current) use of aspirin: Secondary | ICD-10-CM | POA: Insufficient documentation

## 2016-06-04 DIAGNOSIS — Z79899 Other long term (current) drug therapy: Secondary | ICD-10-CM | POA: Insufficient documentation

## 2016-06-04 DIAGNOSIS — M5410 Radiculopathy, site unspecified: Secondary | ICD-10-CM | POA: Diagnosis not present

## 2016-06-04 DIAGNOSIS — I10 Essential (primary) hypertension: Secondary | ICD-10-CM | POA: Insufficient documentation

## 2016-06-04 MED ORDER — GABAPENTIN 100 MG PO CAPS: 100.0000 mg | ORAL_CAPSULE | Freq: Three times a day (TID) | ORAL | 0 refills | Status: DC

## 2016-06-04 NOTE — ED Triage Notes (Addendum)
Patient reports constant right arm numbness which began 3-4 days ago but states this does get better when he goes to sleep.  States that "the blood stops flowing and my arm gets numb and cold" referencing right arm.  States his hand turned purple before due to this issue.  Denies difficulty with ambulation, dizziness, weakness.

## 2016-06-04 NOTE — ED Notes (Signed)
States since Last Tuesday has been having right neck pain that goes to his right arm and causes him to have numbness at times. Prior incident of same years ago and due to "bad disc in my neck" and had surgery for same in the 80"s.

## 2016-06-04 NOTE — ED Provider Notes (Signed)
[place] DEPT [facility] Provider Note   CSN: [id number] Arrival date & time: 06/04/16  2028  By signing my name below, I, Arianna Nassar, attest that this documentation has been prepared under the direction and in the presence of Gareth Morgan, MD.  Electronically Signed: Julien Nordmann, ED Scribe. 06/04/16. 9:08 PM.    History   Chief Complaint Chief Complaint  Patient presents with  . Arm Pain    The history is provided by the patient. No language interpreter was used.   HPI Comments: Troy Marsh is a 65 y.o. male who has a PMhx of HIV and HTN presents to the Emergency Department complaining of gradual worsening, moderate right arm numbness x 4 days ago. Pt notes he has pain on the right side of his neck that radiates down into his right arm. He says his arm feels very cold at times when this occurs he has noted blue/purple color of the dorsum of his hand. Pt notes his arm is more painful with movement, when he is out doing things with his arms.  It was better when he was hanging upside down and when he goes to bed. There has been no trauma to the area. Pt had similar sx in his arm in the 57s when he was diagnosed with a slipped disc in his neck. His last CD4 count was 325. Denies lightheadedness, weakness, or fever.  He has no chest pain, no shortness of breath.    Past Medical History:  Diagnosis Date  . HIV disease ([place]) 1985   last CD4 10/02/12 was 277  . Hypertension   . Hypogonadism male     Patient Active Problem List   Diagnosis Date Noted  . Loss of weight 10/27/2015  . Erectile dysfunction 04/12/2015  . Hemorrhoids 09/28/2014  . Ex-smoker 04/02/2014  . Skin cancer 04/01/2014  . CA of skin 04/01/2014  . Well adult exam 03/30/2014  . BP (high blood pressure) 04/28/2013  . Chronic lower back pain 02/10/2011  . Hypogonadism male 02/10/2011  . Testicular hypofunction 02/10/2011  . Low back pain 02/10/2011  . CERVICAL DISC DISORDER 06/20/2007  . HIV  (human immunodeficiency virus infection) ([place]) 06/20/2007    Past Surgical History:  Procedure Laterality Date  . [place]  . KNEE ARTHROSCOPY Right 2009  . KNEE ARTHROSCOPY Left 2011  . neck surgery  1984       Home Medications    Prior to Admission medications   Medication Sig Start Date End Date Taking? Authorizing Provider  Alprostadil, Vasodilator, (CAVERJECT) 20 MCG SOLR USE AS DIRECTED QD PRN 04/26/16   Evie Lacks Plotnikov, MD  ANUCORT-HC 25 MG suppository PLACE 1 SUPPOSITORY (25 MG  TOTAL) RECTALLY AT BEDTIME  AS NEEDED FOR HEMORRHOIDS  OR ITCHING. 04/12/16   Irene Shipper, MD  aspirin 81 MG tablet Take 81 mg by mouth daily.      Historical Provider, MD  atorvastatin (LIPITOR) 40 MG tablet Take 40 mg by mouth. 11/29/15 11/28/16  Historical Provider, MD  baclofen (LIORESAL) 10 MG tablet Take 1 tablet by mouth 2 (two) times daily as needed. 07/22/15   Historical Provider, MD  celecoxib (CELEBREX) 200 MG capsule TAKE 1 CAPSULE BY MOUTH TWO TIMES DAILY 05/22/16   Cassandria Anger, MD  cholecalciferol (VITAMIN D) 1000 units tablet Take 1 tablet (1,000 Units total) by mouth daily. 04/24/16 04/24/17  Cassandria Anger, MD  emtricitabine-rilpivir-tenofovir AF (ODEFSEY) 200-25-25 MG TABS tablet Take 1 tablet by mouth daily.  04/10/16   Historical Provider, MD  gabapentin (NEURONTIN) 100 MG capsule Take 1 capsule (100 mg total) by mouth 3 (three) times daily. 06/04/16 06/18/16  Gareth Morgan, MD  hydrochlorothiazide (HYDRODIURIL) 50 MG tablet Take 1 tablet by mouth  daily 03/18/16   Cassandria Anger, MD  HYDROcodone-acetaminophen (NORCO) 7.5-325 MG per tablet Take 1 tablet by mouth daily. Patient states he takes 1/2 to 1 tablet daily.    Historical Provider, MD  Melatonin 5 MG TABS Take by mouth.      Historical Provider, MD  multivitamin [facility]) per tablet Take 1 tablet by mouth daily.      Historical Provider, MD  NON FORMULARY Reported on 10/27/2015    Historical  Provider, MD  RESTASIS 0.05 % ophthalmic emulsion Place 1 drop into both eyes 2 (two) times daily. 10/25/15   Historical Provider, MD  Syringe/Needle, Disp, 27G X 1/2" 1 ML MISC Use to administer Caverject injection 04/26/16   Cassandria Anger, MD  tadalafil (CIALIS) 5 MG tablet TAKE 1 TABLET EVERY DAY 04/12/15   Evie Lacks Plotnikov, MD  terbinafine (LAMISIL) 1 % cream Apply 1 application topically 2 (two) times daily.    Historical Provider, MD  testosterone cypionate (DEPO-TESTOSTERONE) 200 MG/ML injection Inject 1 mL (200 mg total) into the muscle every 14 (fourteen) days. 03/16/16 04/15/16  Evie Lacks Plotnikov, MD  testosterone enanthate (DELATESTRYL) 200 MG/ML injection INJECT 1 ML INTRAMUSCULARLY EVERY 14 DAYS *DISCARD EACH VIAL 28 DAYS AFTER INITIAL USE* 05/03/16   Biagio Borg, MD  triamcinolone cream (KENALOG) 0.1 % Apply 1 application topically 2 (two) times daily. Reported on 10/27/2015    Historical Provider, MD  Water For Injection Sterile (STERILE WATER, PRESERVATIVE FREE,) injection Use 1 ml to mix with Caverject as needed 04/28/16   Cassandria Anger, MD  Zinc 50 MG TABS Take 1 tablet by mouth daily. Reported on 10/27/2015    Historical Provider, MD    Family History Family History  Problem Relation Age of Onset  . Coronary artery disease Father   . Heart attack Father   . Hypertension Father   . Hyperlipidemia Father   . Cancer Father     prostate  . Diabetes Neg Hx   . Colon cancer Neg Hx     Social History Social History  Substance Use Topics  . Smoking status: Former Smoker    Packs/day: 2.00    Years: 35.00    Types: Cigarettes    Quit date: 02/10/2004  . Smokeless tobacco: Never Used  . Alcohol use No     Comment: quit 2004     Allergies   Patient has no known allergies.   Review of Systems Review of Systems  Constitutional: Negative for fever.  HENT: Negative for sore throat.   Eyes: Negative for visual disturbance.  Respiratory: Negative for  shortness of breath.   Cardiovascular: Negative for chest pain.  Gastrointestinal: Negative for abdominal pain.  Genitourinary: Negative for difficulty urinating.  Musculoskeletal: Positive for arthralgias and neck pain. Negative for back pain and neck stiffness.  Skin: Positive for color change. Negative for rash.  Neurological: Positive for numbness. Negative for syncope, weakness, light-headedness and headaches.     Physical Exam Updated Vital Signs BP 130/89 (BP Location: Right Arm)   Pulse 79   Temp 97.5 F (36.4 C) (Oral)   Resp 20   Ht 5\' 10"  (1.778 m)   Wt 145 lb (65.8 kg)   SpO2 100%   BMI 20.81  kg/m   Physical Exam  Constitutional: He is oriented to person, place, and time. He appears well-developed and well-nourished.  HENT:  Head: Normocephalic.  Eyes: EOM are normal. Pupils are equal, round, and reactive to light.  Neck: Normal range of motion.  Cardiovascular: Normal rate, regular rhythm, normal heart sounds and intact distal pulses.  Exam reveals no gallop and no friction rub.   No murmur heard. Good pulses  Pulmonary/Chest: Effort normal and breath sounds normal. No respiratory distress. He has no wheezes. He has no rales. He exhibits no tenderness.  Abdominal: He exhibits no distension.  Musculoskeletal: Normal range of motion.  Neurological: He is alert and oriented to person, place, and time.  Good cap refill, good strength in upper extremities  Psychiatric: He has a normal mood and affect.  Nursing note and vitals reviewed.    ED Treatments / Results  DIAGNOSTIC STUDIES: Oxygen Saturation is 100% on RA, normal by my interpretation.  COORDINATION OF CARE:  9:07 PM Discussed treatment plan with pt at bedside and pt agreed to plan.  Labs (all labs ordered are listed, but only abnormal results are displayed) Labs Reviewed - No data to display  EKG  EKG Interpretation None       Radiology No results found.  Procedures Procedures  (including critical care time)  Medications Ordered in ED Medications - No data to display   Initial Impression / Assessment and Plan / ED Course  I have reviewed the triage vital signs and the nursing notes.  Pertinent labs & imaging results that were available during my care of the patient were reviewed by me and considered in my medical decision making (see chart for details).  Clinical Course    65 year old male with history of HIV, last CD4 count 325, hypertension, hyperlipidemia, prior cervical spine surgery, presents with concern for tingling pain radiating from his neck down his right arm, as well as sensation of coldness in his right arm and color changes.  Patient has strong pulses bilaterally, blood pressures within 10 points of each other bilaterally, and have low suspicion for acute arterial thrombosis or occlusion. Doubt upper extremity DVT given no significant swelling or constant pain.  No chest pain, no dyspnea, doubt cardiac etiology.   Possible cervical radicular etiology of symptoms, particularly given his history of prior cervical disease. He does not have any weakness, no bowel or bladder symptoms, no lower extremity symptoms, and feel this can be evaluated as an outpatient. He has a history of HIV, however is afebrile, well-appearing, doubt acute cervical infection, and feel outpatient MRI is appropriate.    Symptoms of intermittent symptoms with arm movements of feeling of coldness, numbness as well as color changes described in the dorsum of his hand, may also be secondary to subclavian steal syndrome or subclavian stenosis. Given patient has no signs of acute arterial occlusion, feel further outpatient evaluation for this is appropriate. Will refer him to vascular surgery for further eval. Will increase his aspirin to 325 once daily. Will initiate gabapentin for treatment of radicular symptoms and recommend PCP follow up.  I personally performed the services described in  this documentation, which was scribed in my presence. The recorded information has been reviewed and is accurate.  Final Clinical Impressions(s) / ED Diagnoses   Final diagnoses:  Right arm pain  Radicular pain in right arm, suspect disc pathology  Numbness and tingling, consider subclavian stenosis    New Prescriptions Discharge Medication List as of 06/04/2016  9:44  PM    START taking these medications   Details  gabapentin (NEURONTIN) 100 MG capsule Take 1 capsule (100 mg total) by mouth 3 (three) times daily., Starting Sun 06/04/2016, Until Sun 06/18/2016, Print         Gareth Morgan, MD 06/04/16 [phone]

## 2016-06-04 NOTE — Discharge Instructions (Signed)
You may increase aspirin to 325mg  per day, follow up with vascular surgery for evaluation of possible subclavian stenosis by history.

## 2016-06-30 ENCOUNTER — Encounter: Payer: Self-pay | Admitting: Internal Medicine

## 2016-07-13 ENCOUNTER — Other Ambulatory Visit: Payer: Self-pay | Admitting: Orthopedic Surgery

## 2016-07-13 DIAGNOSIS — M5020 Other cervical disc displacement, unspecified cervical region: Secondary | ICD-10-CM

## 2016-07-25 ENCOUNTER — Other Ambulatory Visit: Payer: Self-pay | Admitting: Orthopedic Surgery

## 2016-07-25 DIAGNOSIS — M545 Low back pain: Secondary | ICD-10-CM

## 2016-07-27 ENCOUNTER — Other Ambulatory Visit: Payer: Self-pay | Admitting: Internal Medicine

## 2016-07-28 ENCOUNTER — Ambulatory Visit (HOSPITAL_COMMUNITY)
Admission: EM | Admit: 2016-07-28 | Discharge: 2016-07-28 | Disposition: A | Discharge status: Home / Self Care | Payer: Medicare Other | Attending: Family Medicine | Admitting: Family Medicine

## 2016-07-28 ENCOUNTER — Encounter (HOSPITAL_COMMUNITY): Payer: Self-pay

## 2016-07-28 DIAGNOSIS — J0100 Acute maxillary sinusitis, unspecified: Secondary | ICD-10-CM | POA: Diagnosis not present

## 2016-07-28 MED ORDER — DOXYCYCLINE HYCLATE 100 MG PO CAPS: 100.0000 mg | ORAL_CAPSULE | Freq: Two times a day (BID) | ORAL | 0 refills | Status: DC

## 2016-07-28 MED ORDER — IPRATROPIUM BROMIDE 0.06 % NA SOLN: 2.0000 | Freq: Four times a day (QID) | NASAL | 1 refills | Status: DC

## 2016-07-28 NOTE — Discharge Instructions (Signed)
Drink plenty of fluids as discussed, use medicine as prescribed, and mucinex or delsym for cough. Return or see your doctor if further problems °

## 2016-07-28 NOTE — ED Provider Notes (Signed)
[place]    CSN: [id number] Arrival date & time: 07/28/16  1304     History   Chief Complaint Chief Complaint  Patient presents with  . Sinusitis    HPI Troy Marsh is a 65 y.o. male.   The history is provided by the patient.  Sinusitis  Pain details:    Location:  Maxillary   Quality:  Pressure Duration:  14 days Progression:  Unchanged Chronicity:  New Context: recent URI   Relieved by:  None tried Worsened by:  Nothing Ineffective treatments:  None tried Associated symptoms: congestion, cough, rhinorrhea and sore throat   Associated symptoms: no fever and no shortness of breath     Past Medical History:  Diagnosis Date  . HIV disease ([place]) 1985   last CD4 10/02/12 was 277  . Hypertension   . Hypogonadism male     Patient Active Problem List   Diagnosis Date Noted  . Acute sinus infection 08/08/2016  . Loss of weight 10/27/2015  . Erectile dysfunction 04/12/2015  . Hemorrhoids 09/28/2014  . Ex-smoker 04/02/2014  . Skin cancer 04/01/2014  . CA of skin 04/01/2014  . Well adult exam 03/30/2014  . BP (high blood pressure) 04/28/2013  . Chronic lower back pain 02/10/2011  . Hypogonadism male 02/10/2011  . Testicular hypofunction 02/10/2011  . Low back pain 02/10/2011  . CERVICAL DISC DISORDER 06/20/2007  . HIV (human immunodeficiency virus infection) ([place]) 06/20/2007    Past Surgical History:  Procedure Laterality Date  . [place]  . KNEE ARTHROSCOPY Right 2009  . KNEE ARTHROSCOPY Left 2011  . neck surgery  1984       Home Medications    Prior to Admission medications   Medication Sig Start Date End Date Taking? Authorizing Provider  Alprostadil, Vasodilator, (CAVERJECT) 20 MCG SOLR USE AS DIRECTED QD PRN 04/26/16   Aleksei Plotnikov V, MD  ANUCORT-HC 25 MG suppository PLACE 1 SUPPOSITORY (25 MG  TOTAL) RECTALLY AT BEDTIME  AS NEEDED FOR HEMORRHOIDS  OR ITCHING. 04/12/16   Irene Shipper, MD  aspirin 81 MG  tablet Take 81 mg by mouth daily.      Historical Provider, MD  atorvastatin (LIPITOR) 40 MG tablet Take 40 mg by mouth. 11/29/15 11/28/16  Historical Provider, MD  baclofen (LIORESAL) 10 MG tablet Take 1 tablet by mouth 2 (two) times daily as needed. 07/22/15   Historical Provider, MD  celecoxib (CELEBREX) 200 MG capsule TAKE 1 CAPSULE BY MOUTH TWO TIMES DAILY 05/22/16   Lew Dawes V, MD  cholecalciferol (VITAMIN D) 1000 units tablet Take 1 tablet (1,000 Units total) by mouth daily. 04/24/16 04/24/17  Aleksei Plotnikov V, MD  doxycycline (VIBRAMYCIN) 100 MG capsule Take 1 capsule (100 mg total) by mouth 2 (two) times daily. 07/28/16   Billy Fischer, MD  doxycycline (VIBRAMYCIN) 100 MG capsule Take 1 capsule (100 mg total) by mouth 2 (two) times daily. 07/28/16   Billy Fischer, MD  emtricitabine-rilpivir-tenofovir AF (ODEFSEY) 200-25-25 MG TABS tablet Take 1 tablet by mouth daily. 04/10/16   Historical Provider, MD  gabapentin (NEURONTIN) 100 MG capsule Take 1 capsule (100 mg total) by mouth 3 (three) times daily. 06/04/16 06/18/16  Gareth Morgan, MD  hydrochlorothiazide (HYDRODIURIL) 50 MG tablet Take 1 tablet by mouth  daily 03/18/16   Cassandria Anger, MD  HYDROcodone-acetaminophen ([place]) 7.5-325 MG per tablet Take 1 tablet by mouth daily. Patient states he takes 1/2 to 1 tablet daily.  Historical Provider, MD  HYDROcodone-homatropine (HYCODAN) 5-1.5 MG/5ML syrup Take 5 mLs by mouth every 6 (six) hours as needed for cough. 08/08/16 08/18/16  Biagio Borg, MD  ipratropium (ATROVENT) 0.06 % nasal spray Place 2 sprays into both nostrils 4 (four) times daily. 07/28/16   Billy Fischer, MD  ipratropium (ATROVENT) 0.06 % nasal spray Place 2 sprays into both nostrils 4 (four) times daily. 07/28/16   Billy Fischer, MD  levofloxacin (LEVAQUIN) 500 MG tablet Take 1 tablet (500 mg total) by mouth daily. 08/08/16 08/18/16  Biagio Borg, MD  Melatonin 5 MG TABS Take by mouth.      Historical Provider, MD    multivitamin [facility]) per tablet Take 1 tablet by mouth daily.      Historical Provider, MD  NON FORMULARY Reported on 10/27/2015    Historical Provider, MD  RESTASIS 0.05 % ophthalmic emulsion Place 1 drop into both eyes 2 (two) times daily. 10/25/15   Historical Provider, MD  Syringe/Needle, Disp, 27G X 1/2" 1 ML MISC Use to administer Caverject injection 04/26/16   Aleksei Plotnikov V, MD  tadalafil (CIALIS) 5 MG tablet TAKE 1 TABLET EVERY DAY 04/12/15   Aleksei Plotnikov V, MD  terbinafine (LAMISIL) 1 % cream Apply 1 application topically 2 (two) times daily.    Historical Provider, MD  testosterone cypionate (DEPO-TESTOSTERONE) 200 MG/ML injection Inject 1 mL (200 mg total) into the muscle every 14 (fourteen) days. 03/16/16 04/15/16  Aleksei Plotnikov V, MD  testosterone enanthate (DELATESTRYL) 200 MG/ML injection INJECT 1 ML INTRAMUSCULARLY EVERY 14 DAYS. *DISCARD VIAL 28 DAYS AFTERINITIAL USE** 08/04/16   Aleksei Plotnikov V, MD  triamcinolone cream (KENALOG) 0.1 % Apply 1 application topically 2 (two) times daily. Reported on 10/27/2015    Historical Provider, MD  Water For Injection Sterile (STERILE WATER, PRESERVATIVE FREE,) injection Use 1 ml to mix with Caverject as needed 04/28/16   Lew Dawes V, MD  Zinc 50 MG TABS Take 1 tablet by mouth daily. Reported on 10/27/2015    Historical Provider, MD    Family History Family History  Problem Relation Age of Onset  . Coronary artery disease Father   . Heart attack Father   . Hypertension Father   . Hyperlipidemia Father   . Cancer Father     prostate  . Diabetes Neg Hx   . Colon cancer Neg Hx     Social History Social History  Substance Use Topics  . Smoking status: Former Smoker    Packs/day: 2.00    Years: 35.00    Types: Cigarettes    Quit date: 02/10/2004  . Smokeless tobacco: Never Used  . Alcohol use No     Comment: quit 2004     Allergies   Patient has no known allergies.   Review of Systems Review of  Systems  Constitutional: Negative for fever.  HENT: Positive for congestion, postnasal drip, rhinorrhea and sore throat.   Respiratory: Positive for cough. Negative for shortness of breath.   Cardiovascular: Negative.   All other systems reviewed and are negative.    Physical Exam Triage Vital Signs ED Triage Vitals  Enc Vitals Group     BP 07/28/16 1350 112/73     Pulse Rate 07/28/16 1350 72     Resp 07/28/16 1350 16     Temp 07/28/16 1350 98 F (36.7 C)     Temp Source 07/28/16 1350 Oral     SpO2 07/28/16 1350 100 %  Weight --      Height --      Head Circumference --      Peak Flow --      Pain Score 07/28/16 1351 5     Pain Loc --      Pain Edu? --      Excl. in [place]? --    No data found.   Updated Vital Signs BP 112/73 (BP Location: Left Arm)   Pulse 72   Temp 98 F (36.7 C) (Oral)   Resp 16   SpO2 100%   Visual Acuity Right Eye Distance:   Left Eye Distance:   Bilateral Distance:    Right Eye Near:   Left Eye Near:    Bilateral Near:     Physical Exam  Constitutional: He is oriented to person, place, and time. He appears well-developed and well-nourished. No distress.  HENT:  Right Ear: External ear normal.  Left Ear: External ear normal.  Nose: Nose normal.  Mouth/Throat: Oropharynx is clear and moist.  Eyes: Conjunctivae are normal. Pupils are equal, round, and reactive to light.  Neck: Normal range of motion. Neck supple.  Cardiovascular: Normal rate and regular rhythm.   Pulmonary/Chest: Effort normal and breath sounds normal.  Lymphadenopathy:    He has no cervical adenopathy.  Neurological: He is alert and oriented to person, place, and time.  Skin: Skin is warm and dry.  Nursing note and vitals reviewed.    UC Treatments / Results  Labs (all labs ordered are listed, but only abnormal results are displayed) Labs Reviewed - No data to display  EKG  EKG Interpretation None       Radiology No results  found.  Procedures Procedures (including critical care time)  Medications Ordered in UC Medications - No data to display   Initial Impression / Assessment and Plan / UC Course  I have reviewed the triage vital signs and the nursing notes.  Pertinent labs & imaging results that were available during my care of the patient were reviewed by me and considered in my medical decision making (see chart for details).       Final Clinical Impressions(s) / UC Diagnoses   Final diagnoses:  Acute maxillary sinusitis, recurrence not specified    New Prescriptions Discharge Medication List as of 07/28/2016  3:21 PM    START taking these medications   Details  doxycycline (VIBRAMYCIN) 100 MG capsule Take 1 capsule (100 mg total) by mouth 2 (two) times daily., Starting Fri 07/28/2016, Normal    ipratropium (ATROVENT) 0.06 % nasal spray Place 2 sprays into both nostrils 4 (four) times daily., Starting Fri 07/28/2016, Normal         Billy Fischer, MD 08/15/16 [phone]

## 2016-07-28 NOTE — ED Triage Notes (Signed)
Pt having nasal drainage with blood, right ear pain along with popping and dizziness. Has been having symptoms for 14 days. Has been taking sudafed, mucinex and zyrtec. No fever. Along with ayr nasal spray

## 2016-07-31 ENCOUNTER — Ambulatory Visit
Admission: RE | Admit: 2016-07-31 | Discharge: 2016-07-31 | Disposition: A | Discharge status: Home / Self Care | Payer: Medicare Other | Source: Ambulatory Visit | Attending: Orthopedic Surgery | Admitting: Orthopedic Surgery

## 2016-07-31 ENCOUNTER — Other Ambulatory Visit: Payer: Medicare Other

## 2016-07-31 DIAGNOSIS — M545 Low back pain: Secondary | ICD-10-CM

## 2016-07-31 MED ORDER — METHYLPREDNISOLONE ACETATE 40 MG/ML INJ SUSP (RADIOLOG
120.0000 mg | Freq: Once | INTRAMUSCULAR | Status: AC
  Administered 2016-07-31: 120 mg via EPIDURAL

## 2016-07-31 MED ORDER — IOPAMIDOL (ISOVUE-M 200) INJECTION 41%
1.0000 mL | Freq: Once | INTRAMUSCULAR | Status: AC
  Administered 2016-07-31: 1 mL via EPIDURAL

## 2016-07-31 NOTE — Discharge Instructions (Signed)

## 2016-08-08 ENCOUNTER — Ambulatory Visit (INDEPENDENT_AMBULATORY_CARE_PROVIDER_SITE_OTHER): Payer: Medicare Other | Admitting: Internal Medicine

## 2016-08-08 ENCOUNTER — Encounter: Payer: Self-pay | Admitting: Internal Medicine

## 2016-08-08 DIAGNOSIS — J019 Acute sinusitis, unspecified: Secondary | ICD-10-CM | POA: Diagnosis not present

## 2016-08-08 DIAGNOSIS — I10 Essential (primary) hypertension: Secondary | ICD-10-CM

## 2016-08-08 DIAGNOSIS — B20 Human immunodeficiency virus [HIV] disease: Secondary | ICD-10-CM

## 2016-08-08 MED ORDER — LEVOFLOXACIN 500 MG PO TABS: 500.0000 mg | ORAL_TABLET | Freq: Every day | ORAL | 0 refills | Status: DC

## 2016-08-08 MED ORDER — HYDROCODONE-HOMATROPINE 5-1.5 MG/5ML PO SYRP: 5.0000 mL | ORAL_SOLUTION | Freq: Four times a day (QID) | ORAL | 0 refills | Status: AC | PRN

## 2016-08-08 MED ORDER — LEVOFLOXACIN 500 MG PO TABS: 500.0000 mg | ORAL_TABLET | Freq: Every day | ORAL | 0 refills | Status: AC

## 2016-08-08 NOTE — Patient Instructions (Signed)
Please take all new medication as prescribed - the antibiotic, and cough medicine if  Needed  Please continue all other medications as before, and refills have been done if requested.  Please have the pharmacy call with any other refills you may need.  Please keep your appointments with your specialists as you may have planned

## 2016-08-08 NOTE — Progress Notes (Signed)
Pre visit review using our clinic review tool, if applicable. No additional management support is needed unless otherwise documented below in the visit note. 

## 2016-08-08 NOTE — Assessment & Plan Note (Signed)
stable overall by history and exam, recent data reviewed with pt, and pt to continue medical treatment as before,  to f/u any worsening symptoms or concerns BP Readings from Last 3 Encounters:  08/08/16 138/78  07/31/16 (!) 149/82  07/28/16 112/73

## 2016-08-08 NOTE — Assessment & Plan Note (Signed)
Mild to mod, for antibx course,  to f/u any worsening symptoms or concerns 

## 2016-08-08 NOTE — Assessment & Plan Note (Signed)
Stable, cont to follow with ID at [facility]

## 2016-08-08 NOTE — Progress Notes (Signed)
Subjective:    Patient ID: Troy Marsh, male    DOB: 1950/09/23, 66 y.o.   MRN: [id number]  HPI   Here with 2-3 days acute onset fever, facial pain, pressure, headache, general weakness and malaise, and greenish d/c, with mild ST and cough, but pt denies chest pain, wheezing, increased sob or doe, orthopnea, PND, increased LE swelling, palpitations, dizziness or syncope.  Pt denies new neurological symptoms such as new headache, or facial or extremity weakness or numbness   Pt denies polydipsia, polyuria.  No other new history Cont's to be followed per [facility] ID for HIV, doing well with adequate counts per pt Past Medical History:  Diagnosis Date  . HIV disease ([place]) 1985   last CD4 10/02/12 was 277  . Hypertension   . Hypogonadism male    Past Surgical History:  Procedure Laterality Date  . [place]  . KNEE ARTHROSCOPY Right 2009  . KNEE ARTHROSCOPY Left 2011  . neck surgery  1984    reports that he quit smoking about 12 years ago. His smoking use included Cigarettes. He has a 70.00 pack-year smoking history. He has never used smokeless tobacco. He reports that he does not drink alcohol or use drugs. family history includes Cancer in his father; Coronary artery disease in his father; Heart attack in his father; Hyperlipidemia in his father; Hypertension in his father. No Known Allergies Current Outpatient Prescriptions on File Prior to Visit  Medication Sig Dispense Refill  . Alprostadil, Vasodilator, (CAVERJECT) 20 MCG SOLR USE AS DIRECTED QD PRN 18 each 3  . ANUCORT-HC 25 MG suppository PLACE 1 SUPPOSITORY (25 MG  TOTAL) RECTALLY AT BEDTIME  AS NEEDED FOR HEMORRHOIDS  OR ITCHING. 100 suppository 1  . aspirin 81 MG tablet Take 81 mg by mouth daily.      [name] atorvastatin (LIPITOR) 40 MG tablet Take 40 mg by mouth.    . baclofen (LIORESAL) 10 MG tablet Take 1 tablet by mouth 2 (two) times daily as needed.  0  . celecoxib (CELEBREX) 200 MG capsule TAKE 1 CAPSULE BY MOUTH  TWO TIMES DAILY 180 capsule 1  . cholecalciferol (VITAMIN D) 1000 units tablet Take 1 tablet (1,000 Units total) by mouth daily. 100 tablet 3  . doxycycline (VIBRAMYCIN) 100 MG capsule Take 1 capsule (100 mg total) by mouth 2 (two) times daily. 20 capsule 0  . doxycycline (VIBRAMYCIN) 100 MG capsule Take 1 capsule (100 mg total) by mouth 2 (two) times daily. 20 capsule 0  . emtricitabine-rilpivir-tenofovir AF (ODEFSEY) 200-25-25 MG TABS tablet Take 1 tablet by mouth daily.    . hydrochlorothiazide (HYDRODIURIL) 50 MG tablet Take 1 tablet by mouth  daily 90 tablet 3  . HYDROcodone-acetaminophen (NORCO) 7.5-325 MG per tablet Take 1 tablet by mouth daily. Patient states he takes 1/2 to 1 tablet daily.    [name] ipratropium (ATROVENT) 0.06 % nasal spray Place 2 sprays into both nostrils 4 (four) times daily. 15 mL 1  . ipratropium (ATROVENT) 0.06 % nasal spray Place 2 sprays into both nostrils 4 (four) times daily. 15 mL 1  . Melatonin 5 MG TABS Take by mouth.      . multivitamin (THERAGRAN) per tablet Take 1 tablet by mouth daily.      . NON FORMULARY Reported on 10/27/2015    . RESTASIS 0.05 % ophthalmic emulsion Place 1 drop into both eyes 2 (two) times daily.    . Syringe/Needle, Disp, 27G X 1/2" 1 ML MISC  Use to administer Caverject injection 300 each 0  . tadalafil (CIALIS) 5 MG tablet TAKE 1 TABLET EVERY DAY 90 tablet 3  . terbinafine (LAMISIL) 1 % cream Apply 1 application topically 2 (two) times daily.    [name] testosterone enanthate (DELATESTRYL) 200 MG/ML injection INJECT 1 ML INTRAMUSCULARLY EVERY 14 DAYS. *DISCARD VIAL 28 DAYS AFTERINITIAL USE** 5 mL 5  . triamcinolone cream (KENALOG) 0.1 % Apply 1 application topically 2 (two) times daily. Reported on 10/27/2015    . Water For Injection Sterile (STERILE WATER, PRESERVATIVE FREE,) injection Use 1 ml to mix with Caverject as needed 90 mL 1  . Zinc 50 MG TABS Take 1 tablet by mouth daily. Reported on 10/27/2015    . gabapentin (NEURONTIN) 100 MG  capsule Take 1 capsule (100 mg total) by mouth 3 (three) times daily. 42 capsule 0  . testosterone cypionate (DEPO-TESTOSTERONE) 200 MG/ML injection Inject 1 mL (200 mg total) into the muscle every 14 (fourteen) days. 60 mL 1   No current facility-administered medications on file prior to visit.    Review of Systems   Constitutional: Negative for unusual diaphoresis or night sweats HENT: Negative for ear swelling or discharge Eyes: Negative for worsening visual haziness  Respiratory: Negative for choking and stridor.   Gastrointestinal: Negative for distension or worsening eructation Genitourinary: Negative for retention or change in urine volume.  Musculoskeletal: Negative for other MSK pain or swelling Skin: Negative for color change and worsening wound Neurological: Negative for tremors and numbness other than noted  Psychiatric/Behavioral: Negative for decreased concentration or agitation other than above   All other system neg per pt    Objective:   Physical Exam BP 138/78   Pulse 70   Temp 97.9 F (36.6 C) (Oral)   Resp 20   Wt 154 lb (69.9 kg)   SpO2 96%   BMI 22.10 kg/m  VS noted, mild ill Constitutional: Pt appears in no apparent distress HENT: Head: NCAT.  Right Ear: External ear normal.  Left Ear: External ear normal.  Bilat tm's with mild erythema.  Max sinus areas mild tender right >> left.  Pharynx with mild erythema, no exudate Eyes: . Pupils are equal, round, and reactive to light. Conjunctivae and EOM are normal Neck: Normal range of motion. Neck supple.  Cardiovascular: Normal rate and regular rhythm.   Pulmonary/Chest: Effort normal and breath sounds without rales or wheezing.  Neurological: Pt is alert. Not confused , motor grossly intact Skin: Skin is warm. No rash, no LE edema Psychiatric: Pt behavior is normal. No agitation.  No other new exam findings    Assessment & Plan:

## 2016-08-10 ENCOUNTER — Other Ambulatory Visit: Payer: Self-pay | Admitting: Orthopedic Surgery

## 2016-08-14 ENCOUNTER — Other Ambulatory Visit: Payer: Self-pay | Admitting: Internal Medicine

## 2016-08-16 NOTE — Telephone Encounter (Signed)
Called refill into specialty pharmacy [facility] w/[facility] pharmacist gave MD authorization...Troy Marsh

## 2016-08-16 NOTE — Telephone Encounter (Signed)
Called refill into [facility]

## 2016-09-06 ENCOUNTER — Ambulatory Visit
Admission: RE | Admit: 2016-09-06 | Discharge: 2016-09-06 | Disposition: A | Discharge status: Home / Self Care | Payer: Medicare Other | Source: Ambulatory Visit | Attending: Orthopedic Surgery | Admitting: Orthopedic Surgery

## 2016-09-06 DIAGNOSIS — M5020 Other cervical disc displacement, unspecified cervical region: Secondary | ICD-10-CM

## 2016-09-06 MED ORDER — IOPAMIDOL (ISOVUE-M 300) INJECTION 61%
15.0000 mL | Freq: Once | INTRAMUSCULAR | Status: AC | PRN
  Administered 2016-09-06: 15 mL via INTRATHECAL

## 2016-09-06 MED ORDER — TRIAMCINOLONE ACETONIDE 40 MG/ML IJ SUSP (RADIOLOGY)
60.0000 mg | Freq: Once | INTRAMUSCULAR | Status: AC
  Administered 2016-09-06: 60 mg via EPIDURAL

## 2016-10-10 ENCOUNTER — Other Ambulatory Visit: Payer: Self-pay | Admitting: Orthopedic Surgery

## 2016-10-10 DIAGNOSIS — M542 Cervicalgia: Secondary | ICD-10-CM

## 2016-10-17 ENCOUNTER — Encounter: Payer: Self-pay | Admitting: Internal Medicine

## 2016-10-23 ENCOUNTER — Other Ambulatory Visit (INDEPENDENT_AMBULATORY_CARE_PROVIDER_SITE_OTHER): Payer: Medicare Other

## 2016-10-23 ENCOUNTER — Encounter: Payer: Self-pay | Admitting: Internal Medicine

## 2016-10-23 ENCOUNTER — Ambulatory Visit (INDEPENDENT_AMBULATORY_CARE_PROVIDER_SITE_OTHER): Payer: Medicare Other | Admitting: Internal Medicine

## 2016-10-23 DIAGNOSIS — I10 Essential (primary) hypertension: Secondary | ICD-10-CM

## 2016-10-23 DIAGNOSIS — R634 Abnormal weight loss: Secondary | ICD-10-CM | POA: Diagnosis not present

## 2016-10-23 DIAGNOSIS — E291 Testicular hypofunction: Secondary | ICD-10-CM

## 2016-10-23 DIAGNOSIS — N528 Other male erectile dysfunction: Secondary | ICD-10-CM | POA: Diagnosis not present

## 2016-10-23 DIAGNOSIS — G8929 Other chronic pain: Secondary | ICD-10-CM | POA: Diagnosis not present

## 2016-10-23 DIAGNOSIS — B20 Human immunodeficiency virus [HIV] disease: Secondary | ICD-10-CM

## 2016-10-23 DIAGNOSIS — M545 Low back pain: Secondary | ICD-10-CM

## 2016-10-23 DIAGNOSIS — R972 Elevated prostate specific antigen [PSA]: Secondary | ICD-10-CM

## 2016-10-23 DIAGNOSIS — Z21 Asymptomatic human immunodeficiency virus [HIV] infection status: Secondary | ICD-10-CM

## 2016-10-23 LAB — CBC WITH DIFFERENTIAL/PLATELET
BASOS PCT: 0.8 % (ref 0.0–3.0)
Basophils Absolute: 0 10*3/uL (ref 0.0–0.1)
EOS PCT: 1 % (ref 0.0–5.0)
Eosinophils Absolute: 0 10*3/uL (ref 0.0–0.7)
HCT: 44.4 % (ref 39.0–52.0)
HEMOGLOBIN: 15.7 g/dL (ref 13.0–17.0)
LYMPHS ABS: 1.2 10*3/uL (ref 0.7–4.0)
Lymphocytes Relative: 32.2 % (ref 12.0–46.0)
MCHC: 35.4 g/dL (ref 30.0–36.0)
MCV: 88.5 fl (ref 78.0–100.0)
MONOS PCT: 10.7 % (ref 3.0–12.0)
Monocytes Absolute: 0.4 10*3/uL (ref 0.1–1.0)
Neutro Abs: 2.1 10*3/uL (ref 1.4–7.7)
Neutrophils Relative %: 55.3 % (ref 43.0–77.0)
Platelets: 134 10*3/uL — ABNORMAL LOW (ref 150.0–400.0)
RBC: 5.01 Mil/uL (ref 4.22–5.81)
RDW: 14.4 % (ref 11.5–15.5)
WBC: 3.8 10*3/uL — AB (ref 4.0–10.5)

## 2016-10-23 LAB — LIPID PANEL
CHOLESTEROL: 180 mg/dL (ref 0–200)
HDL: 89.5 mg/dL (ref >39.00)
LDL Cholesterol: 85 mg/dL (ref 0–99)
NONHDL: 90.94
Total CHOL/HDL Ratio: 2
Triglycerides: 32 mg/dL (ref 0.0–149.0)
VLDL: 6.4 mg/dL (ref 0.0–40.0)

## 2016-10-23 LAB — BASIC METABOLIC PANEL
BUN: 13 mg/dL (ref 6–23)
CALCIUM: 9.3 mg/dL (ref 8.4–10.5)
CO2: 35 meq/L — AB (ref 19–32)
CREATININE: 1.09 mg/dL (ref 0.40–1.50)
Chloride: 94 mEq/L — ABNORMAL LOW (ref 96–112)
GFR: 71.78 mL/min (ref >60.00)
Glucose, Bld: 95 mg/dL (ref 70–99)
Potassium: 4 mEq/L (ref 3.5–5.1)
SODIUM: 135 meq/L (ref 135–145)

## 2016-10-23 LAB — HEPATIC FUNCTION PANEL
ALT: 20 U/L (ref 0–53)
AST: 22 U/L (ref 0–37)
Albumin: 4.1 g/dL (ref 3.5–5.2)
Alkaline Phosphatase: 39 U/L (ref 39–117)
BILIRUBIN DIRECT: 0.2 mg/dL (ref 0.0–0.3)
TOTAL PROTEIN: 6 g/dL (ref 6.0–8.3)
Total Bilirubin: 0.8 mg/dL (ref 0.2–1.2)

## 2016-10-23 LAB — TESTOSTERONE: Testosterone: 72.27 ng/dL — ABNORMAL LOW (ref 300.00–890.00)

## 2016-10-23 LAB — TSH: TSH: 2.1 u[IU]/mL (ref 0.35–4.50)

## 2016-10-23 LAB — PSA: PSA: 0.67 ng/mL (ref 0.10–4.00)

## 2016-10-23 NOTE — Progress Notes (Unsigned)
``  12`11 `7890-3  033 3                                          0 [id number] ..0 .xvbnm,.ASL;P[] QERTYOP [id number]`[id number];' qwertyuiop[] \ [id number]\

## 2016-10-23 NOTE — Assessment & Plan Note (Signed)
Dr Lynann Bologna - injections prn

## 2016-10-23 NOTE — Assessment & Plan Note (Signed)
On Cialis and Caverjects prn

## 2016-10-23 NOTE — Assessment & Plan Note (Signed)
On Odefsey

## 2016-10-23 NOTE — Progress Notes (Signed)
Subjective:  Patient ID: Troy Marsh, male    DOB: 1951-01-03  Age: 66 y.o. MRN: [id number]  CC: No chief complaint on file.   HPI KEILON RESSEL presents for hypogonadism, HIV, ED f/u; he had a prostate bx  Outpatient Medications Prior to Visit  Medication Sig Dispense Refill  . Alprostadil, Vasodilator, (CAVERJECT) 20 MCG SOLR USE AS DIRECTED QD PRN 18 each 3  . ANUCORT-HC 25 MG suppository PLACE 1 SUPPOSITORY (25 MG  TOTAL) RECTALLY AT BEDTIME  AS NEEDED FOR HEMORRHOIDS  OR ITCHING. 100 suppository 1  . aspirin 81 MG tablet Take 81 mg by mouth daily.      [name] atorvastatin (LIPITOR) 40 MG tablet Take 40 mg by mouth.    . baclofen (LIORESAL) 10 MG tablet Take 1 tablet by mouth 2 (two) times daily as needed.  0  . celecoxib (CELEBREX) 200 MG capsule TAKE 1 CAPSULE BY MOUTH TWO TIMES DAILY 180 capsule 1  . cholecalciferol (VITAMIN D) 1000 units tablet Take 1 tablet (1,000 Units total) by mouth daily. 100 tablet 3  . emtricitabine-rilpivir-tenofovir AF (ODEFSEY) 200-25-25 MG TABS tablet Take 1 tablet by mouth daily.    . hydrochlorothiazide (HYDRODIURIL) 50 MG tablet Take 1 tablet by mouth  daily 90 tablet 3  . HYDROcodone-acetaminophen (NORCO) 7.5-325 MG per tablet Take 1 tablet by mouth daily. Patient states he takes 1/2 to 1 tablet daily.    [name] ipratropium (ATROVENT) 0.06 % nasal spray Place 2 sprays into both nostrils 4 (four) times daily. 15 mL 1  . Melatonin 5 MG TABS Take by mouth.      . multivitamin (THERAGRAN) per tablet Take 1 tablet by mouth daily.      . NON FORMULARY Reported on 10/27/2015    . RESTASIS 0.05 % ophthalmic emulsion Place 1 drop into both eyes 2 (two) times daily.    . Syringe/Needle, Disp, 27G X 1/2" 1 ML MISC Use to administer Caverject injection 300 each 0  . tadalafil (CIALIS) 5 MG tablet TAKE 1 TABLET EVERY DAY 90 tablet 3  . testosterone enanthate (DELATESTRYL) 200 MG/ML injection INJECT 1 ML INTRAMUSCULARLY EVERY 14 DAYS. *DISCARD VIAL 28 DAYS  AFTERINITIAL USE** 5 mL 5  . triamcinolone cream (KENALOG) 0.1 % Apply 1 application topically 2 (two) times daily. Reported on 10/27/2015    . Water For Injection Sterile (STERILE WATER, PRESERVATIVE FREE,) injection Use 1 ml to mix with Caverject as needed 90 mL 1  . gabapentin (NEURONTIN) 100 MG capsule Take 1 capsule (100 mg total) by mouth 3 (three) times daily. 42 capsule 0  . testosterone cypionate (DEPO-TESTOSTERONE) 200 MG/ML injection Inject 1 mL (200 mg total) into the muscle every 14 (fourteen) days. 60 mL 1  . doxycycline (VIBRAMYCIN) 100 MG capsule Take 1 capsule (100 mg total) by mouth 2 (two) times daily. 20 capsule 0  . doxycycline (VIBRAMYCIN) 100 MG capsule Take 1 capsule (100 mg total) by mouth 2 (two) times daily. 20 capsule 0  . ipratropium (ATROVENT) 0.06 % nasal spray Place 2 sprays into both nostrils 4 (four) times daily. 15 mL 1  . terbinafine (LAMISIL) 1 % cream Apply 1 application topically 2 (two) times daily.    . Zinc 50 MG TABS Take 1 tablet by mouth daily. Reported on 10/27/2015     No facility-administered medications prior to visit.     ROS Review of Systems  Constitutional: Negative for appetite change, fatigue and unexpected weight change.  HENT: Negative for  congestion, nosebleeds, sneezing, sore throat and trouble swallowing.   Eyes: Negative for itching and visual disturbance.  Respiratory: Negative for cough.   Cardiovascular: Negative for chest pain, palpitations and leg swelling.  Gastrointestinal: Negative for abdominal distention, blood in stool, diarrhea and nausea.  Genitourinary: Negative for frequency and hematuria.  Musculoskeletal: Positive for back pain, neck pain and neck stiffness. Negative for gait problem and joint swelling.  Skin: Negative for rash.  Neurological: Negative for dizziness, tremors, speech difficulty and weakness.  Psychiatric/Behavioral: Negative for agitation, dysphoric mood, sleep disturbance and suicidal ideas. The  patient is not nervous/anxious.     Objective:  BP 106/68 (BP Location: Right Arm, Patient Position: Sitting, Cuff Size: Normal)   Pulse (!) 53   Temp 97.7 F (36.5 C) (Oral)   Ht 5\' 10"  (1.778 m)   Wt 154 lb (69.9 kg)   SpO2 99%   BMI 22.10 kg/m   BP Readings from Last 3 Encounters:  10/23/16 106/68  09/06/16 (!) 142/82  08/08/16 138/78    Wt Readings from Last 3 Encounters:  10/23/16 154 lb (69.9 kg)  08/08/16 154 lb (69.9 kg)  06/04/16 145 lb (65.8 kg)    Physical Exam  Constitutional: He is oriented to person, place, and time. He appears well-developed. No distress.  NAD  HENT:  Mouth/Throat: Oropharynx is clear and moist.  Eyes: Conjunctivae are normal. Pupils are equal, round, and reactive to light.  Neck: Normal range of motion. No JVD present. No thyromegaly present.  Cardiovascular: Normal rate, regular rhythm, normal heart sounds and intact distal pulses.  Exam reveals no gallop and no friction rub.   No murmur heard. Pulmonary/Chest: Effort normal and breath sounds normal. No respiratory distress. He has no wheezes. He has no rales. He exhibits no tenderness.  Abdominal: Soft. Bowel sounds are normal. He exhibits no distension and no mass. There is no tenderness. There is no rebound and no guarding.  Musculoskeletal: Normal range of motion. He exhibits tenderness. He exhibits no edema.  Lymphadenopathy:    He has no cervical adenopathy.  Neurological: He is alert and oriented to person, place, and time. He has normal reflexes. No cranial nerve deficit. He exhibits normal muscle tone. He displays a negative Romberg sign. Coordination and gait normal.  Skin: Skin is warm and dry. No rash noted.  Psychiatric: He has a normal mood and affect. His behavior is normal. Judgment and thought content normal.    Lab Results  Component Value Date   WBC 4.1 10/27/2015   HGB 16.6 10/27/2015   HCT 47.3 10/27/2015   PLT 150.0 10/27/2015   GLUCOSE 101 (H) 10/27/2015    CHOL 248 (H) 10/27/2015   TRIG 90.0 10/27/2015   HDL 81.50 10/27/2015   LDLDIRECT 113.3 05/21/2006   LDLCALC 148 (H) 10/27/2015   ALT 14 10/27/2015   AST 17 10/27/2015   NA 136 10/27/2015   K 4.0 10/27/2015   CL 97 10/27/2015   CREATININE 1.22 10/27/2015   BUN 14 10/27/2015   CO2 33 (H) 10/27/2015   TSH 2.24 10/27/2015   PSA 1.06 10/27/2015    Dg Inject Diag/thera/inc Needle/cath/plc Epi/cerv/thor W/img  Result Date: 09/06/2016 CLINICAL DATA:  Cervical spondylosis. Displacement of the cervical disc at multiple levels. Bilateral hand paresthesias. FLUOROSCOPY TIME:  Radiation Exposure Index (as provided by the fluoroscopic device): 14.5 uGy*m2 Fluoroscopy Time:  22 seconds Number of Acquired Images:  0 PROCEDURE: CERVICAL EPIDURAL INJECTION An interlaminar approach was performed on the left at C7-T1 .  A 20 gauge epidural needle was advanced using loss-of-resistance technique. DIAGNOSTIC EPIDURAL INJECTION Injection of Isovue-M 300 shows a good epidural pattern with spread above and below the level of needle placement, primarily on the left. No vascular opacification is seen. THERAPEUTIC EPIDURAL INJECTION 1.5 ml of Kenalog 40 mixed with 1 ml of 1% Lidocaine and 2 ml of normal saline were then instilled. The procedure was well-tolerated, and the patient was discharged thirty minutes following the injection in good condition. IMPRESSION: Technically successful third overall epidural injection on the left at C7-T1. Electronically Signed   By: San Morelle M.D.   On: 09/06/2016 09:16    Assessment & Plan:   There are no diagnoses linked to this encounter. I have discontinued Mr. Swoboda Zinc, terbinafine, doxycycline, and doxycycline. I am also having him maintain his aspirin, NON FORMULARY, Melatonin, multivitamin, HYDROcodone-acetaminophen, triamcinolone cream, tadalafil, baclofen, RESTASIS, atorvastatin, testosterone cypionate, hydrochlorothiazide, ANUCORT-HC,  emtricitabine-rilpivir-tenofovir AF, cholecalciferol, Alprostadil (Vasodilator), Syringe/Needle (Disp), sterile water (preservative free), celecoxib, gabapentin, ipratropium, and testosterone enanthate.  No orders of the defined types were placed in this encounter.    Follow-up: No Follow-up on file.  [name], MD

## 2016-10-23 NOTE — Assessment & Plan Note (Signed)
s/p (-) bx Jan 2018

## 2016-10-23 NOTE — Assessment & Plan Note (Signed)
NAS 

## 2016-10-23 NOTE — Progress Notes (Signed)
Pre visit review using our clinic review tool, if applicable. No additional management support is needed unless otherwise documented below in the visit note. 

## 2016-10-23 NOTE — Assessment & Plan Note (Signed)
Wt Readings from Last 3 Encounters:  10/23/16 154 lb (69.9 kg)  08/08/16 154 lb (69.9 kg)  06/04/16 145 lb (65.8 kg)

## 2016-10-23 NOTE — Assessment & Plan Note (Signed)
On testosterone 

## 2016-11-06 ENCOUNTER — Ambulatory Visit
Admission: RE | Admit: 2016-11-06 | Discharge: 2016-11-06 | Disposition: A | Discharge status: Home / Self Care | Payer: Medicare Other | Source: Ambulatory Visit | Attending: Orthopedic Surgery | Admitting: Orthopedic Surgery

## 2016-11-06 ENCOUNTER — Other Ambulatory Visit: Payer: Self-pay | Admitting: Orthopedic Surgery

## 2016-11-06 DIAGNOSIS — M542 Cervicalgia: Secondary | ICD-10-CM

## 2016-11-06 MED ORDER — IOPAMIDOL (ISOVUE-M 200) INJECTION 41%
1.0000 mL | Freq: Once | INTRAMUSCULAR | Status: AC
  Administered 2016-11-06: 1 mL via EPIDURAL

## 2016-11-06 MED ORDER — METHYLPREDNISOLONE ACETATE 40 MG/ML INJ SUSP (RADIOLOG
120.0000 mg | Freq: Once | INTRAMUSCULAR | Status: AC
  Administered 2016-11-06: 120 mg via EPIDURAL

## 2016-12-20 ENCOUNTER — Other Ambulatory Visit: Payer: Self-pay | Admitting: Orthopedic Surgery

## 2016-12-28 ENCOUNTER — Encounter (HOSPITAL_BASED_OUTPATIENT_CLINIC_OR_DEPARTMENT_OTHER): Payer: Self-pay | Admitting: *Deleted

## 2016-12-28 ENCOUNTER — Emergency Department (HOSPITAL_BASED_OUTPATIENT_CLINIC_OR_DEPARTMENT_OTHER)
Admission: EM | Admit: 2016-12-28 | Discharge: 2016-12-28 | Disposition: A | Discharge status: Home / Self Care | Payer: Medicare Other | Attending: Physician Assistant | Admitting: Physician Assistant

## 2016-12-28 DIAGNOSIS — Z7982 Long term (current) use of aspirin: Secondary | ICD-10-CM | POA: Insufficient documentation

## 2016-12-28 DIAGNOSIS — Z79899 Other long term (current) drug therapy: Secondary | ICD-10-CM | POA: Insufficient documentation

## 2016-12-28 DIAGNOSIS — Z23 Encounter for immunization: Secondary | ICD-10-CM | POA: Insufficient documentation

## 2016-12-28 DIAGNOSIS — Y929 Unspecified place or not applicable: Secondary | ICD-10-CM | POA: Insufficient documentation

## 2016-12-28 DIAGNOSIS — I10 Essential (primary) hypertension: Secondary | ICD-10-CM | POA: Insufficient documentation

## 2016-12-28 DIAGNOSIS — M62838 Other muscle spasm: Secondary | ICD-10-CM | POA: Diagnosis not present

## 2016-12-28 DIAGNOSIS — W19XXXA Unspecified fall, initial encounter: Secondary | ICD-10-CM | POA: Insufficient documentation

## 2016-12-28 DIAGNOSIS — Y999 Unspecified external cause status: Secondary | ICD-10-CM | POA: Insufficient documentation

## 2016-12-28 DIAGNOSIS — Y939 Activity, unspecified: Secondary | ICD-10-CM | POA: Insufficient documentation

## 2016-12-28 DIAGNOSIS — Z87891 Personal history of nicotine dependence: Secondary | ICD-10-CM | POA: Insufficient documentation

## 2016-12-28 DIAGNOSIS — S3992XA Unspecified injury of lower back, initial encounter: Secondary | ICD-10-CM | POA: Diagnosis present

## 2016-12-28 MED ORDER — IBUPROFEN 800 MG PO TABS: 800.0000 mg | ORAL_TABLET | Freq: Three times a day (TID) | ORAL | 0 refills | Status: DC

## 2016-12-28 MED ORDER — CYCLOBENZAPRINE HCL 10 MG PO TABS: 10.0000 mg | ORAL_TABLET | Freq: Two times a day (BID) | ORAL | 0 refills | Status: DC | PRN

## 2016-12-28 MED ORDER — TETANUS-DIPHTH-ACELL PERTUSSIS 5-2.5-18.5 LF-MCG/0.5 IM SUSP
0.5000 mL | Freq: Once | INTRAMUSCULAR | Status: AC
  Administered 2016-12-28: 0.5 mL via INTRAMUSCULAR
  Filled 2016-12-28: qty 0.5

## 2016-12-28 MED FILL — CYCLOBENZAPRINE 10 MG TAB: 10 | 5 days supply | Qty: 10 | Fill #0

## 2016-12-28 MED FILL — IBUPROFEN 800 MG TAB: 800 | 7 days supply | Qty: 21 | Fill #0

## 2016-12-28 NOTE — ED Provider Notes (Signed)
[facility] Provider Note   CSN: [id number] Arrival date & time: 12/28/16  1243     History   Chief Complaint Chief Complaint  Patient presents with  . Fall    HPI Troy Marsh is a 66 y.o. male.  HPI   Patient is 66 year old male presenting with fall. Patient had small mechanical fall earlier today. Patient tripped on the step and landed on his back. No loc, did not strike head.   Past Medical History:  Diagnosis Date  . HIV disease ([place]) 1985   last CD4 10/02/12 was 277  . Hypertension   . Hypogonadism male     Patient Active Problem List   Diagnosis Date Noted  . Elevated PSA 10/23/2016  . Acute sinus infection 08/08/2016  . Loss of weight 10/27/2015  . Erectile dysfunction 04/12/2015  . Hemorrhoids 09/28/2014  . Ex-smoker 04/02/2014  . Skin cancer 04/01/2014  . CA of skin 04/01/2014  . Well adult exam 03/30/2014  . HTN (hypertension) 04/28/2013  . Chronic lower back pain 02/10/2011  . Hypogonadism male 02/10/2011  . Testicular hypofunction 02/10/2011  . Low back pain 02/10/2011  . CERVICAL DISC DISORDER 06/20/2007  . HIV (human immunodeficiency virus infection) ([place]) 06/20/2007    Past Surgical History:  Procedure Laterality Date  . [place]  . KNEE ARTHROSCOPY Right 2009  . KNEE ARTHROSCOPY Left 2011  . neck surgery  1984       Home Medications    Prior to Admission medications   Medication Sig Start Date End Date Taking? Authorizing Provider  Alprostadil, Vasodilator, (CAVERJECT) 20 MCG SOLR USE AS DIRECTED QD PRN 04/26/16   Plotnikov, Evie Lacks, MD  ANUCORT-HC 25 MG suppository PLACE 1 SUPPOSITORY (25 MG  TOTAL) RECTALLY AT BEDTIME  AS NEEDED FOR HEMORRHOIDS  OR ITCHING. 04/12/16   Irene Shipper, MD  aspirin 81 MG tablet Take 81 mg by mouth daily.      [provider]  atorvastatin (LIPITOR) 40 MG tablet Take 40 mg by mouth. 11/29/15 11/28/16  [provider]  baclofen (LIORESAL) 10 MG tablet  Take 1 tablet by mouth 2 (two) times daily as needed. 07/22/15   [provider]  celecoxib (CELEBREX) 200 MG capsule TAKE 1 CAPSULE BY MOUTH TWO TIMES DAILY 05/22/16   Plotnikov, Evie Lacks, MD  cholecalciferol (VITAMIN D) 1000 units tablet Take 1 tablet (1,000 Units total) by mouth daily. 04/24/16 04/24/17  Plotnikov, Evie Lacks, MD  emtricitabine-rilpivir-tenofovir AF (ODEFSEY) 200-25-25 MG TABS tablet Take 1 tablet by mouth daily. 04/10/16   [provider]  gabapentin (NEURONTIN) 100 MG capsule Take 1 capsule (100 mg total) by mouth 3 (three) times daily. 06/04/16 06/18/16  Gareth Morgan, MD  hydrochlorothiazide (HYDRODIURIL) 50 MG tablet Take 1 tablet by mouth  daily 03/18/16   Plotnikov, Evie Lacks, MD  HYDROcodone-acetaminophen (NORCO) 7.5-325 MG per tablet Take 1 tablet by mouth daily. Patient states Troy Marsh takes 1/2 to 1 tablet daily.    [provider]  ipratropium (ATROVENT) 0.06 % nasal spray Place 2 sprays into both nostrils 4 (four) times daily. 07/28/16   Billy Fischer, MD  Melatonin 5 MG TABS Take by mouth.      [provider]  multivitamin [facility]) per tablet Take 1 tablet by mouth daily.      [provider]  NON FORMULARY Reported on 10/27/2015    [provider]  RESTASIS 0.05 % ophthalmic emulsion Place 1 drop into both eyes  2 (two) times daily. 10/25/15   [provider]  Syringe/Needle, Disp, 27G X 1/2" 1 ML MISC Use to administer Caverject injection 04/26/16   Plotnikov, Evie Lacks, MD  tadalafil (CIALIS) 5 MG tablet TAKE 1 TABLET EVERY DAY 04/12/15   Plotnikov, Evie Lacks, MD  testosterone cypionate (DEPO-TESTOSTERONE) 200 MG/ML injection Inject 1 mL (200 mg total) into the muscle every 14 (fourteen) days. 03/16/16 04/15/16  Plotnikov, Evie Lacks, MD  testosterone enanthate (DELATESTRYL) 200 MG/ML injection INJECT 1 ML INTRAMUSCULARLY EVERY 14 DAYS. *DISCARD VIAL 28 DAYS AFTERINITIAL USE** 08/15/16   Plotnikov, Evie Lacks, MD    triamcinolone cream (KENALOG) 0.1 % Apply 1 application topically 2 (two) times daily. Reported on 10/27/2015    [provider]  Water For Injection Sterile (STERILE WATER, PRESERVATIVE FREE,) injection Use 1 ml to mix with Caverject as needed 04/28/16   Plotnikov, Evie Lacks, MD    Family History Family History  Problem Relation Age of Onset  . Coronary artery disease Father   . Heart attack Father   . Hypertension Father   . Hyperlipidemia Father   . Cancer Father        prostate  . Diabetes Neg Hx   . Colon cancer Neg Hx     Social History Social History  Substance Use Topics  . Smoking status: Former Smoker    Packs/day: 2.00    Years: 35.00    Types: Cigarettes    Quit date: 02/10/2004  . Smokeless tobacco: Never Used  . Alcohol use No     Comment: quit 2004     Allergies   Patient has no known allergies.   Review of Systems Review of Systems  HENT: Negative for ear pain.   Respiratory: Negative for cough.   Cardiovascular: Negative for chest pain.  Genitourinary: Negative for dysuria.  Musculoskeletal: Negative for back pain.  Skin: Negative for rash.  All other systems reviewed and are negative.    Physical Exam Updated Vital Signs BP 133/60   Pulse 78   Temp 97.8 F (36.6 C) (Oral)   Resp 20   Ht 5\' 10"  (1.778 m)   Wt 68 kg (150 lb)   SpO2 98%   BMI 21.52 kg/m   Physical Exam  Constitutional: Troy Marsh is oriented to person, place, and time. Troy Marsh appears well-nourished.  HENT:  Head: Normocephalic and atraumatic.  Eyes: Conjunctivae are normal.  Cardiovascular: Normal rate.   Pulmonary/Chest: Effort normal and breath sounds normal. No respiratory distress.  Abdominal: Soft. Troy Marsh exhibits no distension. There is no tenderness. There is no guarding.  Musculoskeletal:  Mild tenderness in the paraspinal region in the T-spine. No tenderness over the T or L. No signs of bruising or ecchymosis.  Neurological: Troy Marsh is oriented to person, place, and  time.  Skin: Skin is warm and dry. Troy Marsh is not diaphoretic.  Small skin tear to the right forearm.  Psychiatric: Troy Marsh has a normal mood and affect. His behavior is normal.     ED Treatments / Results  Labs (all labs ordered are listed, but only abnormal results are displayed) Labs Reviewed - No data to display  EKG  EKG Interpretation None       Radiology No results found.  Procedures Procedures (including critical care time)  Medications Ordered in ED Medications - No data to display   Initial Impression / Assessment and Plan / ED Course  I have reviewed the triage vital signs and the nursing notes.  Pertinent labs &  imaging results that were available during my care of the patient were reviewed by me and considered in my medical decision making (see chart for details).     Very well-appearing 66 year old male here with mechanical fall. Small skin tear. Do not suspect any bony abnormality or injury given the right reassuring physical exam. Patient has full range of motion at all joints and is ambulatory without pain. We will update tetanus.  We'll screen urine for gross hematuria.  Neg for hematuira.  WIll treat symtpms.   Final Clinical Impressions(s) / ED Diagnoses   Final diagnoses:  None    New Prescriptions New Prescriptions   No medications on file     Macarthur Critchley, MD 12/28/16 1408

## 2016-12-28 NOTE — ED Notes (Signed)
ED Provider at bedside. 

## 2016-12-28 NOTE — ED Triage Notes (Signed)
He fell coming down the steps this am. Pain between his ribs and pelvis. Ambulatory afterward.

## 2016-12-28 NOTE — ED Notes (Signed)
Pt ambulated in room with a steady gate to provide a urine sample

## 2016-12-28 NOTE — ED Notes (Signed)
Pt right arm cleansed, mepitel applied with bacitracin and wrapped with kerlex.

## 2017-01-02 ENCOUNTER — Emergency Department (HOSPITAL_BASED_OUTPATIENT_CLINIC_OR_DEPARTMENT_OTHER): Payer: Medicare Other

## 2017-01-02 ENCOUNTER — Encounter (HOSPITAL_BASED_OUTPATIENT_CLINIC_OR_DEPARTMENT_OTHER): Payer: Self-pay | Admitting: *Deleted

## 2017-01-02 ENCOUNTER — Emergency Department (HOSPITAL_BASED_OUTPATIENT_CLINIC_OR_DEPARTMENT_OTHER)
Admission: EM | Admit: 2017-01-02 | Discharge: 2017-01-02 | Disposition: A | Discharge status: Home / Self Care | Payer: Medicare Other | Attending: Emergency Medicine | Admitting: Emergency Medicine

## 2017-01-02 DIAGNOSIS — M5441 Lumbago with sciatica, right side: Secondary | ICD-10-CM | POA: Diagnosis not present

## 2017-01-02 DIAGNOSIS — M545 Low back pain: Secondary | ICD-10-CM | POA: Diagnosis present

## 2017-01-02 DIAGNOSIS — Z79899 Other long term (current) drug therapy: Secondary | ICD-10-CM | POA: Diagnosis not present

## 2017-01-02 DIAGNOSIS — G8929 Other chronic pain: Secondary | ICD-10-CM | POA: Diagnosis not present

## 2017-01-02 DIAGNOSIS — Z7982 Long term (current) use of aspirin: Secondary | ICD-10-CM | POA: Insufficient documentation

## 2017-01-02 DIAGNOSIS — I10 Essential (primary) hypertension: Secondary | ICD-10-CM | POA: Diagnosis not present

## 2017-01-02 DIAGNOSIS — Z87891 Personal history of nicotine dependence: Secondary | ICD-10-CM | POA: Diagnosis not present

## 2017-01-02 MED ORDER — KETOROLAC TROMETHAMINE 60 MG/2ML IM SOLN
15.0000 mg | Freq: Once | INTRAMUSCULAR | Status: AC
  Administered 2017-01-02: 15 mg via INTRAMUSCULAR
  Filled 2017-01-02: qty 2

## 2017-01-02 MED ORDER — OXYCODONE HCL 5 MG PO TABS
5.0000 mg | ORAL_TABLET | Freq: Once | ORAL | Status: AC
  Administered 2017-01-02: 5 mg via ORAL
  Filled 2017-01-02: qty 1

## 2017-01-02 MED ORDER — DIAZEPAM 2 MG PO TABS
2.0000 mg | ORAL_TABLET | Freq: Once | ORAL | Status: AC
Start: 2017-01-02 — End: 2017-01-02
  Administered 2017-01-02: 2 mg via ORAL
  Filled 2017-01-02: qty 1

## 2017-01-02 MED ORDER — ACETAMINOPHEN 500 MG PO TABS
1000.0000 mg | ORAL_TABLET | Freq: Once | ORAL | Status: AC
  Administered 2017-01-02: 1000 mg via ORAL
  Filled 2017-01-02: qty 2

## 2017-01-02 NOTE — ED Triage Notes (Signed)
Fall 3-4 days ago. Reports that he was seen after the fall. Reports back pain continues. Ambulatory.

## 2017-01-02 NOTE — ED Provider Notes (Signed)
[facility] DEPT [facility] Provider Note   CSN: [id number] Arrival date & time: 01/02/17  1207     History   Chief Complaint Chief Complaint  Patient presents with  . Fall    HPI Troy Marsh is a 66 y.o. male.  66 yo M with a chief complaint of right sided low back pain. Patient has a history of back pain and this is worsening of his chronic pain. He gets injections about every couple months. States that he was walking and slipped on the stairs a couple days ago. Was seen in the ED with no acute injury and was discharged. Continues to have significant increase of his pain. Denies loss of bowel or bladder denies loss of perirectal sensation. Denies numbness or weakness. Feels that the pain is sharp and burning and radiates down to his knee.   The history is provided by the patient.  Fall  This is a recurrent problem. The current episode started more than 2 days ago. The problem occurs constantly. The problem has not changed since onset.Pertinent negatives include no chest pain, no abdominal pain, no headaches and no shortness of breath. The symptoms are aggravated by bending, twisting and walking. Nothing relieves the symptoms. He has tried nothing for the symptoms. The treatment provided no relief.    Past Medical History:  Diagnosis Date  . HIV disease ([place]) 1985   last CD4 10/02/12 was 277  . Hypertension   . Hypogonadism male     Patient Active Problem List   Diagnosis Date Noted  . Elevated PSA 10/23/2016  . Acute sinus infection 08/08/2016  . Loss of weight 10/27/2015  . Erectile dysfunction 04/12/2015  . Hemorrhoids 09/28/2014  . Ex-smoker 04/02/2014  . Skin cancer 04/01/2014  . CA of skin 04/01/2014  . Well adult exam 03/30/2014  . HTN (hypertension) 04/28/2013  . Chronic lower back pain 02/10/2011  . Hypogonadism male 02/10/2011  . Testicular hypofunction 02/10/2011  . Low back pain 02/10/2011  . CERVICAL DISC DISORDER 06/20/2007  . HIV (human  immunodeficiency virus infection) ([place]) 06/20/2007    Past Surgical History:  Procedure Laterality Date  . [place]  . KNEE ARTHROSCOPY Right 2009  . KNEE ARTHROSCOPY Left 2011  . neck surgery  1984       Home Medications    Prior to Admission medications   Medication Sig Start Date End Date Taking? Authorizing Provider  Alprostadil, Vasodilator, (CAVERJECT) 20 MCG SOLR USE AS DIRECTED QD PRN 04/26/16   Plotnikov, Evie Lacks, MD  ANUCORT-HC 25 MG suppository PLACE 1 SUPPOSITORY (25 MG  TOTAL) RECTALLY AT BEDTIME  AS NEEDED FOR HEMORRHOIDS  OR ITCHING. 04/12/16   Irene Shipper, MD  aspirin 81 MG tablet Take 81 mg by mouth daily.      [provider]  atorvastatin (LIPITOR) 40 MG tablet Take 40 mg by mouth. 11/29/15 11/28/16  [provider]  baclofen (LIORESAL) 10 MG tablet Take 1 tablet by mouth 2 (two) times daily as needed. 07/22/15   [provider]  celecoxib (CELEBREX) 200 MG capsule TAKE 1 CAPSULE BY MOUTH TWO TIMES DAILY 05/22/16   Plotnikov, Evie Lacks, MD  cholecalciferol (VITAMIN D) 1000 units tablet Take 1 tablet (1,000 Units total) by mouth daily. 04/24/16 04/24/17  Plotnikov, Evie Lacks, MD  cyclobenzaprine (FLEXERIL) 10 MG tablet Take 1 tablet (10 mg total) by mouth 2 (two) times daily as needed for muscle spasms. 12/28/16   Mackuen, Fredia Sorrow, MD  emtricitabine-rilpivir-tenofovir  AF (ODEFSEY) 200-25-25 MG TABS tablet Take 1 tablet by mouth daily. 04/10/16   [provider]  gabapentin (NEURONTIN) 100 MG capsule Take 1 capsule (100 mg total) by mouth 3 (three) times daily. 06/04/16 06/18/16  Gareth Morgan, MD  hydrochlorothiazide (HYDRODIURIL) 50 MG tablet Take 1 tablet by mouth  daily 03/18/16   Plotnikov, Evie Lacks, MD  HYDROcodone-acetaminophen (NORCO) 7.5-325 MG per tablet Take 1 tablet by mouth daily. Patient states he takes 1/2 to 1 tablet daily.    [provider]  ibuprofen (ADVIL,MOTRIN) 800 MG tablet Take 1  tablet (800 mg total) by mouth 3 (three) times daily. 12/28/16   Mackuen, Courteney Lyn, MD  ipratropium (ATROVENT) 0.06 % nasal spray Place 2 sprays into both nostrils 4 (four) times daily. 07/28/16   Billy Fischer, MD  Melatonin 5 MG TABS Take by mouth.      [provider]  multivitamin [facility]) per tablet Take 1 tablet by mouth daily.      [provider]  NON FORMULARY Reported on 10/27/2015    [provider]  RESTASIS 0.05 % ophthalmic emulsion Place 1 drop into both eyes 2 (two) times daily. 10/25/15   [provider]  Syringe/Needle, Disp, 27G X 1/2" 1 ML MISC Use to administer Caverject injection 04/26/16   Plotnikov, Evie Lacks, MD  tadalafil (CIALIS) 5 MG tablet TAKE 1 TABLET EVERY DAY 04/12/15   Plotnikov, Evie Lacks, MD  testosterone cypionate (DEPO-TESTOSTERONE) 200 MG/ML injection Inject 1 mL (200 mg total) into the muscle every 14 (fourteen) days. 03/16/16 04/15/16  Plotnikov, Evie Lacks, MD  testosterone enanthate (DELATESTRYL) 200 MG/ML injection INJECT 1 ML INTRAMUSCULARLY EVERY 14 DAYS. *DISCARD VIAL 28 DAYS AFTERINITIAL USE** 08/15/16   Plotnikov, Evie Lacks, MD  triamcinolone cream (KENALOG) 0.1 % Apply 1 application topically 2 (two) times daily. Reported on 10/27/2015    [provider]  Water For Injection Sterile (STERILE WATER, PRESERVATIVE FREE,) injection Use 1 ml to mix with Caverject as needed 04/28/16   Plotnikov, Evie Lacks, MD    Family History Family History  Problem Relation Age of Onset  . Coronary artery disease Father   . Heart attack Father   . Hypertension Father   . Hyperlipidemia Father   . Cancer Father        prostate  . Diabetes Neg Hx   . Colon cancer Neg Hx     Social History Social History  Substance Use Topics  . Smoking status: Former Smoker    Packs/day: 2.00    Years: 35.00    Types: Cigarettes    Quit date: 02/10/2004  . Smokeless tobacco: Never Used  . Alcohol use No     Comment: quit 2004       Allergies   Patient has no known allergies.   Review of Systems Review of Systems  Constitutional: Negative for chills and fever.  HENT: Negative for congestion and facial swelling.   Eyes: Negative for discharge and visual disturbance.  Respiratory: Negative for shortness of breath.   Cardiovascular: Negative for chest pain and palpitations.  Gastrointestinal: Negative for abdominal pain, diarrhea and vomiting.  Musculoskeletal: Positive for arthralgias, back pain and myalgias.  Skin: Negative for color change and rash.  Neurological: Negative for tremors, syncope and headaches.  Psychiatric/Behavioral: Negative for confusion and dysphoric mood.     Physical Exam Updated Vital Signs BP (!) 124/91 (BP Location: Left Arm)   Pulse 75   Temp 97.9 F (36.6 C) (Oral)  Resp 18   Ht 5\' 10"  (1.778 m)   Wt 68 kg (150 lb)   SpO2 98%   BMI 21.52 kg/m   Physical Exam  Constitutional: He is oriented to person, place, and time. He appears well-developed and well-nourished.  HENT:  Head: Normocephalic and atraumatic.  Eyes: EOM are normal. Pupils are equal, round, and reactive to light.  Neck: Normal range of motion. Neck supple. No JVD present.  Cardiovascular: Normal rate and regular rhythm.  Exam reveals no gallop and no friction rub.   No murmur heard. Pulmonary/Chest: No respiratory distress. He has no wheezes.  Abdominal: He exhibits no distension and no mass. There is no tenderness. There is no rebound and no guarding.  Musculoskeletal: Normal range of motion.       Back:  Tender at the attachment of the oblique muscles to the ribs and down to the pelvis. Pulse motor and sensation is intact to the right lower extremity. Normal reflexes no clonus.  Neurological: He is alert and oriented to person, place, and time.  Skin: No rash noted. No pallor.  Psychiatric: He has a normal mood and affect. His behavior is normal.  Nursing note and vitals reviewed.    ED  Treatments / Results  Labs (all labs ordered are listed, but only abnormal results are displayed) Labs Reviewed - No data to display  EKG  EKG Interpretation None       Radiology Dg Lumbar Spine Complete  Result Date: 01/02/2017 CLINICAL DATA:  Patient c/o lower back pains s/p fall down steps on 12/30/16, no other complaints EXAM: LUMBAR SPINE - COMPLETE 4+ VIEW COMPARISON:  None. FINDINGS: Normal alignment of lumbar vertebral bodies. No loss of vertebral body height or disc height. No pars fracture. No subluxation. IMPRESSION: No acute findings lumbar spine Electronically Signed   By: Suzy Bouchard M.D.   On: 01/02/2017 15:02    Procedures Procedures (including critical care time)  Medications Ordered in ED Medications  acetaminophen (TYLENOL) tablet 1,000 mg (1,000 mg Oral Given 01/02/17 1504)  ketorolac (TORADOL) injection 15 mg (15 mg Intramuscular Given 01/02/17 1505)  diazepam (VALIUM) tablet 2 mg (2 mg Oral Given 01/02/17 1504)  oxyCODONE (Oxy IR/ROXICODONE) immediate release tablet 5 mg (5 mg Oral Given 01/02/17 1504)     Initial Impression / Assessment and Plan / ED Course  I have reviewed the triage vital signs and the nursing notes.  Pertinent labs & imaging results that were available during my care of the patient were reviewed by me and considered in my medical decision making (see chart for details).     66 yo M With a chief complaint of right-sided low back pain. Likely musculoskeletal by history. Plain film was obtained as the patient is still having symptoms for days after the initial fall. Negative for fracture. Will have the patient follow-up with his PCP a spinal surgeon.   3:14 PM:  I have discussed the diagnosis/risks/treatment options with the patient and family and believe the pt to be eligible for discharge home to follow-up with PCP. We also discussed returning to the ED immediately if new or worsening sx occur. We discussed the sx which are most  concerning (e.g., sudden worsening pain, fever, inability to tolerate by mouth) that necessitate immediate return. Medications administered to the patient during their visit and any new prescriptions provided to the patient are listed below.  Medications given during this visit Medications  acetaminophen (TYLENOL) tablet 1,000 mg (1,000 mg Oral Given 01/02/17  1504)  ketorolac (TORADOL) injection 15 mg (15 mg Intramuscular Given 01/02/17 1505)  diazepam (VALIUM) tablet 2 mg (2 mg Oral Given 01/02/17 1504)  oxyCODONE (Oxy IR/ROXICODONE) immediate release tablet 5 mg (5 mg Oral Given 01/02/17 1504)     The patient appears reasonably screen and/or stabilized for discharge and I doubt any other medical condition or other [facility] requiring further screening, evaluation, or treatment in the ED at this time prior to discharge.   Final Clinical Impressions(s) / ED Diagnoses   Final diagnoses:  Acute right-sided low back pain with right-sided sciatica    New Prescriptions New Prescriptions   No medications on file     Deno Etienne, DO 01/02/17 1514

## 2017-01-02 NOTE — ED Notes (Addendum)
Patient stated that his pain is behind his ribcage.  Patient denies of any recent fall.

## 2017-01-02 NOTE — Discharge Instructions (Signed)
Take 4 over the counter ibuprofen tablets 3 times a day or 2 over-the-counter naproxen tablets twice a day for pain. Also take tylenol 1000mg(2 extra strength) four times a day.    

## 2017-01-12 ENCOUNTER — Other Ambulatory Visit: Payer: Self-pay | Admitting: Orthopedic Surgery

## 2017-01-12 DIAGNOSIS — M545 Low back pain: Principal | ICD-10-CM

## 2017-01-12 DIAGNOSIS — G8929 Other chronic pain: Secondary | ICD-10-CM

## 2017-01-22 ENCOUNTER — Ambulatory Visit
Admission: RE | Admit: 2017-01-22 | Discharge: 2017-01-22 | Disposition: A | Discharge status: Home / Self Care | Payer: Medicare Other | Source: Ambulatory Visit | Attending: Orthopedic Surgery | Admitting: Orthopedic Surgery

## 2017-01-22 DIAGNOSIS — M545 Low back pain: Principal | ICD-10-CM

## 2017-01-22 DIAGNOSIS — G8929 Other chronic pain: Secondary | ICD-10-CM

## 2017-01-22 MED ORDER — METHYLPREDNISOLONE ACETATE 40 MG/ML INJ SUSP (RADIOLOG
120.0000 mg | Freq: Once | INTRAMUSCULAR | Status: AC
  Administered 2017-01-22: 120 mg via EPIDURAL

## 2017-01-22 MED ORDER — IOPAMIDOL (ISOVUE-M 200) INJECTION 41%
1.0000 mL | Freq: Once | INTRAMUSCULAR | Status: AC
  Administered 2017-01-22: 1 mL via EPIDURAL

## 2017-02-03 ENCOUNTER — Other Ambulatory Visit: Payer: Self-pay | Admitting: Internal Medicine

## 2017-03-15 ENCOUNTER — Other Ambulatory Visit: Payer: Self-pay | Admitting: Internal Medicine

## 2017-03-26 ENCOUNTER — Other Ambulatory Visit: Payer: Self-pay | Admitting: Orthopedic Surgery

## 2017-03-26 DIAGNOSIS — M545 Low back pain: Principal | ICD-10-CM

## 2017-03-26 DIAGNOSIS — G8929 Other chronic pain: Secondary | ICD-10-CM

## 2017-04-10 ENCOUNTER — Ambulatory Visit
Admission: RE | Admit: 2017-04-10 | Discharge: 2017-04-10 | Disposition: A | Discharge status: Home / Self Care | Payer: Medicare Other | Source: Ambulatory Visit | Attending: Orthopedic Surgery | Admitting: Orthopedic Surgery

## 2017-04-10 DIAGNOSIS — M545 Low back pain: Principal | ICD-10-CM

## 2017-04-10 DIAGNOSIS — G8929 Other chronic pain: Secondary | ICD-10-CM

## 2017-04-10 MED ORDER — METHYLPREDNISOLONE ACETATE 40 MG/ML INJ SUSP (RADIOLOG
120.0000 mg | Freq: Once | INTRAMUSCULAR | Status: AC
  Administered 2017-04-10: 120 mg via EPIDURAL

## 2017-04-10 MED ORDER — IOPAMIDOL (ISOVUE-M 200) INJECTION 41%
1.0000 mL | Freq: Once | INTRAMUSCULAR | Status: AC
  Administered 2017-04-10: 1 mL via EPIDURAL

## 2017-04-24 ENCOUNTER — Other Ambulatory Visit (INDEPENDENT_AMBULATORY_CARE_PROVIDER_SITE_OTHER): Payer: Medicare Other

## 2017-04-24 ENCOUNTER — Ambulatory Visit: Payer: Medicare Other

## 2017-04-24 ENCOUNTER — Encounter: Payer: Self-pay | Admitting: Internal Medicine

## 2017-04-24 ENCOUNTER — Ambulatory Visit (INDEPENDENT_AMBULATORY_CARE_PROVIDER_SITE_OTHER): Payer: Medicare Other | Admitting: Internal Medicine

## 2017-04-24 VITALS — BP 126/76 | HR 65 | Temp 98.3°F | Resp 20 | Ht 70.0 in | Wt 149.0 lb

## 2017-04-24 DIAGNOSIS — Z23 Encounter for immunization: Secondary | ICD-10-CM

## 2017-04-24 DIAGNOSIS — Z0000 Encounter for general adult medical examination without abnormal findings: Secondary | ICD-10-CM

## 2017-04-24 DIAGNOSIS — R634 Abnormal weight loss: Secondary | ICD-10-CM

## 2017-04-24 DIAGNOSIS — E291 Testicular hypofunction: Secondary | ICD-10-CM

## 2017-04-24 DIAGNOSIS — R972 Elevated prostate specific antigen [PSA]: Secondary | ICD-10-CM | POA: Diagnosis not present

## 2017-04-24 DIAGNOSIS — J309 Allergic rhinitis, unspecified: Secondary | ICD-10-CM | POA: Insufficient documentation

## 2017-04-24 LAB — URINALYSIS
Bilirubin Urine: NEGATIVE
Hgb urine dipstick: NEGATIVE
Ketones, ur: NEGATIVE
Leukocytes, UA: NEGATIVE
Nitrite: NEGATIVE
SPECIFIC GRAVITY, URINE: 1.01 (ref 1.000–1.030)
TOTAL PROTEIN, URINE-UPE24: NEGATIVE
URINE GLUCOSE: NEGATIVE
Urobilinogen, UA: 0.2 (ref 0.0–1.0)
pH: 6 (ref 5.0–8.0)

## 2017-04-24 LAB — LIPID PANEL
Cholesterol: 160 mg/dL (ref 0–200)
HDL: 80.7 mg/dL (ref >39.00)
LDL CALC: 67 mg/dL (ref 0–99)
NONHDL: 79.03
Total CHOL/HDL Ratio: 2
Triglycerides: 61 mg/dL (ref 0.0–149.0)
VLDL: 12.2 mg/dL (ref 0.0–40.0)

## 2017-04-24 LAB — CBC WITH DIFFERENTIAL/PLATELET
BASOS ABS: 0.1 10*3/uL (ref 0.0–0.1)
Basophils Relative: 1 % (ref 0.0–3.0)
EOS PCT: 1.4 % (ref 0.0–5.0)
Eosinophils Absolute: 0.1 10*3/uL (ref 0.0–0.7)
HCT: 46.1 % (ref 39.0–52.0)
HEMOGLOBIN: 16.1 g/dL (ref 13.0–17.0)
LYMPHS ABS: 1.4 10*3/uL (ref 0.7–4.0)
Lymphocytes Relative: 24.9 % (ref 12.0–46.0)
MCHC: 35 g/dL (ref 30.0–36.0)
MCV: 89.3 fl (ref 78.0–100.0)
MONOS PCT: 8.3 % (ref 3.0–12.0)
Monocytes Absolute: 0.5 10*3/uL (ref 0.1–1.0)
NEUTROS PCT: 64.4 % (ref 43.0–77.0)
Neutro Abs: 3.5 10*3/uL (ref 1.4–7.7)
Platelets: 151 10*3/uL (ref 150.0–400.0)
RBC: 5.16 Mil/uL (ref 4.22–5.81)
RDW: 13.8 % (ref 11.5–15.5)
WBC: 5.5 10*3/uL (ref 4.0–10.5)

## 2017-04-24 LAB — HEPATIC FUNCTION PANEL
ALBUMIN: 4.2 g/dL (ref 3.5–5.2)
ALK PHOS: 41 U/L (ref 39–117)
ALT: 17 U/L (ref 0–53)
AST: 20 U/L (ref 0–37)
BILIRUBIN TOTAL: 0.8 mg/dL (ref 0.2–1.2)
Bilirubin, Direct: 0.2 mg/dL (ref 0.0–0.3)
Total Protein: 6 g/dL (ref 6.0–8.3)

## 2017-04-24 LAB — BASIC METABOLIC PANEL
BUN: 12 mg/dL (ref 6–23)
CALCIUM: 9 mg/dL (ref 8.4–10.5)
CO2: 34 meq/L — AB (ref 19–32)
Chloride: 91 mEq/L — ABNORMAL LOW (ref 96–112)
Creatinine, Ser: 1.15 mg/dL (ref 0.40–1.50)
GFR: 67.37 mL/min (ref >60.00)
Glucose, Bld: 94 mg/dL (ref 70–99)
Potassium: 3.3 mEq/L — ABNORMAL LOW (ref 3.5–5.1)
Sodium: 131 mEq/L — ABNORMAL LOW (ref 135–145)

## 2017-04-24 LAB — TESTOSTERONE: TESTOSTERONE: 166.75 ng/dL — AB (ref 300.00–890.00)

## 2017-04-24 LAB — PSA: PSA: 1.16 ng/mL (ref 0.10–4.00)

## 2017-04-24 LAB — TSH: TSH: 2.66 u[IU]/mL (ref 0.35–4.50)

## 2017-04-24 MED ORDER — MONTELUKAST SODIUM 10 MG PO TABS: 10.0000 mg | ORAL_TABLET | Freq: Every day | ORAL | 11 refills | Status: DC

## 2017-04-24 NOTE — Progress Notes (Signed)
Pre visit review using our clinic review tool, if applicable. No additional management support is needed unless otherwise documented below in the visit note. 

## 2017-04-24 NOTE — Assessment & Plan Note (Signed)
labs

## 2017-04-24 NOTE — Progress Notes (Signed)
Subjective:  Patient ID: Troy Marsh, male    DOB: 02/03/1951  Age: 66 y.o. MRN: [id number]  CC: Medicare Wellness   HPI Troy Marsh presents for well exam C/o allergies - worse F/u hypogonadism  Outpatient Medications Prior to Visit  Medication Sig Dispense Refill  . Alprostadil, Vasodilator, (CAVERJECT) 20 MCG SOLR USE AS DIRECTED QD PRN 18 each 3  . ANUCORT-HC 25 MG suppository PLACE 1 SUPPOSITORY (25 MG  TOTAL) RECTALLY AT BEDTIME  AS NEEDED FOR HEMORRHOIDS  OR ITCHING. 100 suppository 1  . aspirin 81 MG tablet Take 81 mg by mouth daily.      . baclofen (LIORESAL) 10 MG tablet Take 1 tablet by mouth 2 (two) times daily as needed.  0  . celecoxib (CELEBREX) 200 MG capsule TAKE 1 CAPSULE BY MOUTH TWO TIMES DAILY 180 capsule 1  . cholecalciferol (VITAMIN D) 1000 units tablet Take 1 tablet (1,000 Units total) by mouth daily. 100 tablet 3  . cyclobenzaprine (FLEXERIL) 10 MG tablet Take 1 tablet (10 mg total) by mouth 2 (two) times daily as needed for muscle spasms. 10 tablet 0  . emtricitabine-rilpivir-tenofovir AF (ODEFSEY) 200-25-25 MG TABS tablet Take 1 tablet by mouth daily.    . hydrochlorothiazide (HYDRODIURIL) 50 MG tablet TAKE 1 TABLET BY MOUTH  DAILY 90 tablet 2  . HYDROcodone-acetaminophen (NORCO) 7.5-325 MG per tablet Take 1 tablet by mouth daily. Patient states he takes 1/2 to 1 tablet daily.    [name] ibuprofen (ADVIL,MOTRIN) 800 MG tablet Take 1 tablet (800 mg total) by mouth 3 (three) times daily. 21 tablet 0  . ipratropium (ATROVENT) 0.06 % nasal spray Place 2 sprays into both nostrils 4 (four) times daily. 15 mL 1  . Melatonin 5 MG TABS Take by mouth.      . multivitamin (THERAGRAN) per tablet Take 1 tablet by mouth daily.      . NON FORMULARY Reported on 10/27/2015    . RESTASIS 0.05 % ophthalmic emulsion Place 1 drop into both eyes 2 (two) times daily.    . Syringe/Needle, Disp, 27G X 1/2" 1 ML MISC Use to administer Caverject injection 300 each 0  . tadalafil  (CIALIS) 5 MG tablet TAKE 1 TABLET EVERY DAY 90 tablet 3  . testosterone enanthate (DELATESTRYL) 200 MG/ML injection INJECT 1 ML INTRAMUSCULARLY EVERY 14 DAYS. *DISCARD VIAL 28 DAYS AFTERINITIAL USE** 5 mL 5  . triamcinolone cream (KENALOG) 0.1 % Apply 1 application topically 2 (two) times daily. Reported on 10/27/2015    . Water For Injection Sterile (STERILE WATER, PRESERVATIVE FREE,) injection Use 1 ml to mix with Caverject as needed 90 mL 1  . atorvastatin (LIPITOR) 40 MG tablet Take 40 mg by mouth.    . gabapentin (NEURONTIN) 100 MG capsule Take 1 capsule (100 mg total) by mouth 3 (three) times daily. 42 capsule 0  . testosterone cypionate (DEPO-TESTOSTERONE) 200 MG/ML injection Inject 1 mL (200 mg total) into the muscle every 14 (fourteen) days. 60 mL 1   No facility-administered medications prior to visit.     ROS Review of Systems  Constitutional: Negative for appetite change, fatigue and unexpected weight change.  HENT: Positive for congestion, postnasal drip and rhinorrhea. Negative for nosebleeds, sneezing, sore throat and trouble swallowing.   Eyes: Negative for itching and visual disturbance.  Respiratory: Negative for cough.   Cardiovascular: Negative for chest pain, palpitations and leg swelling.  Gastrointestinal: Negative for abdominal distention, blood in stool, diarrhea and nausea.  Genitourinary: Negative  for frequency and hematuria.  Musculoskeletal: Positive for arthralgias. Negative for back pain, gait problem, joint swelling and neck pain.  Skin: Negative for rash.  Neurological: Negative for dizziness, tremors, speech difficulty and weakness.  Psychiatric/Behavioral: Negative for agitation, dysphoric mood and sleep disturbance. The patient is not nervous/anxious.     Objective:  BP 126/76   Pulse 65   Temp 98.3 F (36.8 C)   Resp 20   Ht 5\' 10"  (1.778 m)   Wt 149 lb (67.6 kg)   SpO2 99%   BMI 21.38 kg/m   BP Readings from Last 3 Encounters:  04/24/17  126/76  04/10/17 (!) 147/80  01/22/17 (!) 146/93    Wt Readings from Last 3 Encounters:  04/24/17 149 lb (67.6 kg)  01/02/17 150 lb (68 kg)  12/28/16 150 lb (68 kg)    Physical Exam  Constitutional: He is oriented to person, place, and time. He appears well-developed. No distress.  NAD  HENT:  Mouth/Throat: Oropharynx is clear and moist.  Eyes: Pupils are equal, round, and reactive to light. Conjunctivae are normal.  Neck: Normal range of motion. No JVD present. No thyromegaly present.  Cardiovascular: Normal rate, regular rhythm, normal heart sounds and intact distal pulses.  Exam reveals no gallop and no friction rub.   No murmur heard. Pulmonary/Chest: Effort normal and breath sounds normal. No respiratory distress. He has no wheezes. He has no rales. He exhibits no tenderness.  Abdominal: Soft. Bowel sounds are normal. He exhibits no distension and no mass. There is no tenderness. There is no rebound and no guarding.  Musculoskeletal: Normal range of motion. He exhibits no edema or tenderness.  Lymphadenopathy:    He has no cervical adenopathy.  Neurological: He is alert and oriented to person, place, and time. He has normal reflexes. No cranial nerve deficit. He exhibits normal muscle tone. He displays a negative Romberg sign. Coordination and gait normal.  Skin: Skin is warm and dry. No rash noted.  Psychiatric: He has a normal mood and affect. His behavior is normal. Judgment and thought content normal.  ankles, knees - painful at times  Lab Results  Component Value Date   WBC 3.8 (L) 10/23/2016   HGB 15.7 10/23/2016   HCT 44.4 10/23/2016   PLT 134.0 (L) 10/23/2016   GLUCOSE 95 10/23/2016   CHOL 180 10/23/2016   TRIG 32.0 10/23/2016   HDL 89.50 10/23/2016   LDLDIRECT 113.3 05/21/2006   LDLCALC 85 10/23/2016   ALT 20 10/23/2016   AST 22 10/23/2016   NA 135 10/23/2016   K 4.0 10/23/2016   CL 94 (L) 10/23/2016   CREATININE 1.09 10/23/2016   BUN 13 10/23/2016   CO2  35 (H) 10/23/2016   TSH 2.10 10/23/2016   PSA 0.67 10/23/2016    Dg Inject Diag/thera/inc Needle/cath/plc Epi/lumb/sac W/img  Result Date: 04/10/2017 CLINICAL DATA:  New right paraspinal pain. Displacement of the L4-5 lumbar disc. Chronic left-sided pain which has been amenable to prior epidural steroid injections. FLUOROSCOPY TIME:  Radiation Exposure Index (as provided by the fluoroscopic device): 8.85 uGy*m2 Fluoroscopy Time:  24 seconds. Number of Acquired Images:  0 PROCEDURE: The procedure, risks, benefits, and alternatives were explained to the patient. Questions regarding the procedure were encouraged and answered. The patient understands and consents to the procedure. LUMBAR EPIDURAL INJECTION: An interlaminar approach was performed on the right at L4-5. The overlying skin was cleansed and anesthetized. A 20 gauge epidural needle was advanced using loss-of-resistance technique. DIAGNOSTIC EPIDURAL  INJECTION: Injection of Isovue-M 200 shows a good epidural pattern with spread above and below the level of needle placement, primarily on the right no vascular opacification is seen. THERAPEUTIC EPIDURAL INJECTION: 120 Mg of Depo-Medrol mixed with 3 mL 1% lidocaine were instilled. The procedure was well-tolerated, and the patient was discharged thirty minutes following the injection in good condition. COMPLICATIONS: None IMPRESSION: Technically successful epidural injection on the right L4-5 # 3 Electronically Signed   By: San Morelle M.D.   On: 04/10/2017 08:49    Assessment & Plan:   Kahleel was seen today for medicare wellness.  Diagnoses and all orders for this visit:  Need for influenza vaccination -     Flu vaccine HIGH DOSE PF (Fluzone High dose)   I am having Troy Marsh maintain his aspirin, NON FORMULARY, Melatonin, multivitamin, HYDROcodone-acetaminophen, triamcinolone cream, tadalafil, baclofen, RESTASIS, atorvastatin, testosterone cypionate, ANUCORT-HC,  emtricitabine-rilpivir-tenofovir AF, cholecalciferol, Alprostadil (Vasodilator), Syringe/Needle (Disp), sterile water (preservative free), gabapentin, ipratropium, testosterone enanthate, ibuprofen, cyclobenzaprine, celecoxib, and hydrochlorothiazide.  No orders of the defined types were placed in this encounter.    Follow-up: No Follow-up on file.  [name], MD

## 2017-04-24 NOTE — Assessment & Plan Note (Signed)
Here for medicare wellness/physical  Diet: heart healthy  Physical activity: not sedentary - walking 5 mi/d at 5 am Depression/mood screen: negative  Hearing: intact to whispered voice  Visual acuity: grossly normal, performs annual eye exam  ADLs: capable  Fall risk: low to none  Home safety: good  Cognitive evaluation: intact to orientation, naming, recall and repetition  EOL planning: adv directives, full code/ I agree  I have personally reviewed and have noted  1. The patient's medical, surgical and social history  2. Their use of alcohol, tobacco or illicit drugs  3. Their current medications and supplements  4. The patient's functional ability including ADL's, fall risks, home safety risks and hearing or visual impairment.  5. Diet and physical activities  6. Evidence for depression or mood disorders 7. The roster of all physicians providing medical care to patient - is listed in the Snapshot section of the chart and reviewed today.    Today patient counseled on age appropriate routine health concerns for screening and prevention, each reviewed and up to date or declined. Immunizations reviewed and up to date or declined. Labs ordered and reviewed. Risk factors for depression reviewed and negative. Hearing function and visual acuity are intact. ADLs screened and addressed as needed. Functional ability and level of safety reviewed and appropriate. Education, counseling and referrals performed based on assessed risks today. Patient provided with a copy of personalized plan for preventive services.   colonoscopy Jan '03, 2015 Immunizations: Tetanus June '10; Shingles vaccine Jan '11

## 2017-04-24 NOTE — Assessment & Plan Note (Signed)
PSA

## 2017-04-24 NOTE — Assessment & Plan Note (Signed)
Wt Readings from Last 3 Encounters:  04/24/17 149 lb (67.6 kg)  01/02/17 150 lb (68 kg)  12/28/16 150 lb (68 kg)

## 2017-04-24 NOTE — Progress Notes (Signed)
Subjective:   Troy Marsh is a 66 y.o. male who presents for Medicare Annual/Subsequent preventive examination.  Review of Systems:  No ROS.  Medicare Wellness Visit. Additional risk factors are reflected in the social history.  Cardiac Risk Factors include: advanced age (>29men, >38 women);hypertension;male gender  Sleep patterns: feels rested on waking, gets up 1-2  times nightly to void and sleeps 6-7 hours nightly.    Home Safety/Smoke Alarms: Feels safe in home. Smoke alarms in place.  Living environment; residence and Firearm Safety: 1-story house/ trailer, no firearms, Lives with husband, no needs for DME, good support system. Seat Belt Safety/Bike Helmet: Wears seat belt.    Male:   CCS- Last 04/08/14, 10 year recall    PSA-  Lab Results  Component Value Date   PSA 1.16 04/24/2017   PSA 0.67 10/23/2016   PSA 1.06 10/27/2015       Objective:    Vitals: BP 126/76   Pulse 65   Temp 98.3 F (36.8 C)   Resp 20   Ht 5\' 10"  (1.778 m)   Wt 149 lb (67.6 kg)   SpO2 99%   BMI 21.38 kg/m   Body mass index is 21.38 kg/m.  Tobacco History  Smoking Status  . Former Smoker  . Packs/day: 2.00  . Years: 35.00  . Types: Cigarettes  . Quit date: 02/10/2004  Smokeless Tobacco  . Never Used     Counseling given: Not Answered   Past Medical History:  Diagnosis Date  . HIV disease ([place]) 1985   last CD4 10/02/12 was 277  . Hypertension   . Hypogonadism male    Past Surgical History:  Procedure Laterality Date  . [place]  . KNEE ARTHROSCOPY Right 2009  . KNEE ARTHROSCOPY Left 2011  . neck surgery  1984   Family History  Problem Relation Age of Onset  . Coronary artery disease Father   . Heart attack Father   . Hypertension Father   . Hyperlipidemia Father   . Cancer Father        prostate  . Diabetes Neg Hx   . Colon cancer Neg Hx    History  Sexual Activity  . Sexual activity: Yes  . Partners: Male  . Birth control/  protection: Condom    Comment: monogamous w/ single male partner 1981    Outpatient Encounter Prescriptions as of 04/24/2017  Medication Sig  . Alprostadil, Vasodilator, (CAVERJECT) 20 MCG SOLR USE AS DIRECTED QD PRN  . ANUCORT-HC 25 MG suppository PLACE 1 SUPPOSITORY (25 MG  TOTAL) RECTALLY AT BEDTIME  AS NEEDED FOR HEMORRHOIDS  OR ITCHING.  [name] aspirin 81 MG tablet Take 81 mg by mouth daily.    . baclofen (LIORESAL) 10 MG tablet Take 1 tablet by mouth 2 (two) times daily as needed.  . celecoxib (CELEBREX) 200 MG capsule TAKE 1 CAPSULE BY MOUTH TWO TIMES DAILY  . cholecalciferol (VITAMIN D) 1000 units tablet Take 1 tablet (1,000 Units total) by mouth daily.  . cyclobenzaprine (FLEXERIL) 10 MG tablet Take 1 tablet (10 mg total) by mouth 2 (two) times daily as needed for muscle spasms.  [name] emtricitabine-rilpivir-tenofovir AF (ODEFSEY) 200-25-25 MG TABS tablet Take 1 tablet by mouth daily.  . hydrochlorothiazide (HYDRODIURIL) 50 MG tablet TAKE 1 TABLET BY MOUTH  DAILY  . HYDROcodone-acetaminophen (NORCO) 7.5-325 MG per tablet Take 1 tablet by mouth daily. Patient states he takes 1/2 to 1 tablet daily.  [name] ibuprofen (ADVIL,MOTRIN) 800 MG  tablet Take 1 tablet (800 mg total) by mouth 3 (three) times daily.  [name] ipratropium (ATROVENT) 0.06 % nasal spray Place 2 sprays into both nostrils 4 (four) times daily.  . Melatonin 5 MG TABS Take by mouth.    . multivitamin (THERAGRAN) per tablet Take 1 tablet by mouth daily.    . NON FORMULARY Reported on 10/27/2015  . RESTASIS 0.05 % ophthalmic emulsion Place 1 drop into both eyes 2 (two) times daily.  . Syringe/Needle, Disp, 27G X 1/2" 1 ML MISC Use to administer Caverject injection  . tadalafil (CIALIS) 5 MG tablet TAKE 1 TABLET EVERY DAY  . testosterone enanthate (DELATESTRYL) 200 MG/ML injection INJECT 1 ML INTRAMUSCULARLY EVERY 14 DAYS. *DISCARD VIAL 28 DAYS AFTERINITIAL USE**  . triamcinolone cream (KENALOG) 0.1 % Apply 1 application topically 2 (two) times  daily. Reported on 10/27/2015  . Water For Injection Sterile (STERILE WATER, PRESERVATIVE FREE,) injection Use 1 ml to mix with Caverject as needed  . atorvastatin (LIPITOR) 40 MG tablet Take 40 mg by mouth.  . gabapentin (NEURONTIN) 100 MG capsule Take 1 capsule (100 mg total) by mouth 3 (three) times daily.  . montelukast (SINGULAIR) 10 MG tablet Take 1 tablet (10 mg total) by mouth daily.  [name] testosterone cypionate (DEPO-TESTOSTERONE) 200 MG/ML injection Inject 1 mL (200 mg total) into the muscle every 14 (fourteen) days.   No facility-administered encounter medications on file as of 04/24/2017.     Activities of Daily Living In your present state of health, do you have any difficulty performing the following activities: 04/24/2017  Hearing? N  Vision? N  Difficulty concentrating or making decisions? N  Walking or climbing stairs? N  Dressing or bathing? N  Doing errands, shopping? N  Preparing Food and eating ? N  Using the Toilet? N  In the past six months, have you accidently leaked urine? N  Do you have problems with loss of bowel control? N  Managing your Medications? N  Managing your Finances? N  Housekeeping or managing your Housekeeping? N  Some recent data might be hidden    Patient Care Team: Plotnikov, Evie Lacks, MD as PCP - General (Internal Medicine) Almedia Balls, MD (Orthopedic Surgery) Debby Freiberg, MD as Referring Physician (Infectious Diseases) Irene Shipper, MD as Consulting Physician (Gastroenterology)   Assessment:    Physical assessment deferred to PCP.  Exercise Activities and Dietary recommendations Current Exercise Habits: Structured exercise class;Home exercise routine, Type of exercise: walking;strength training/weights, Time (Minutes): 45, Frequency (Times/Week): 7, Weekly Exercise (Minutes/Week): 315, Intensity: Mild, Exercise limited by: orthopedic condition(s)  Diet (meal preparation, eat out, water intake, caffeinated beverages, dairy products,  fruits and vegetables): in general, a "healthy" diet  , well balanced, eats a variety of fruits and vegetables daily, limits salt, fat/cholesterol, sugar, caffeine, drinks 3-4 glasses of water daily.    Reviewed heart healthy, encouraged patient to increase daily water intake.    Goals    . <enter goal here>    . Take time to relax more.          Put into my schedule to go to my rocking-chair and have complete quite time relaxation.      Fall Risk Fall Risk  04/24/2017 10/23/2016 10/27/2015 04/12/2015  Falls in the past year? Yes No No No  Number falls in past yr: 1 - - -  Injury with Fall? No - - -   Depression Screen PHQ 2/9 Scores 04/24/2017 10/23/2016 10/27/2015  PHQ - 2 Score  0 0 0  PHQ- 9 Score 0 - -    Cognitive Function MMSE - Mini Mental State Exam 04/24/2017  Orientation to time 5  Orientation to Place 5  Registration 3  Attention/ Calculation 5  Recall 2  Language- name 2 objects 2  Language- repeat 1  Language- follow 3 step command 3  Language- read & follow direction 1  Write a sentence 1  Copy design 1  Total score 29        Immunization History  Administered Date(s) Administered  . Influenza Split 04/20/2011  . Influenza, High Dose Seasonal PF 04/24/2016  . Influenza,inj,Quad PF,6+ Mos 03/30/2014, 04/12/2015  . Influenza-Unspecified 03/21/2013  . Pneumococcal Conjugate-13 09/30/2012  . Pneumococcal Polysaccharide-23 09/20/2009, 04/24/2016  . Td 12/18/2008  . Tdap 12/28/2016  . Zoster 04/12/2015   Screening Tests Health Maintenance  Topic Date Due  . Hepatitis C Screening  [birth year]  . INFLUENZA VACCINE  02/14/2017  . COLONOSCOPY  04/08/2024  . TETANUS/TDAP  12/29/2026  . PNA vac Low Risk Adult  Completed      Plan:    Continue doing brain stimulating activities (puzzles, reading, adult coloring books, staying active) to keep memory sharp.   Continue to eat heart healthy diet (full of fruits, vegetables, whole grains, lean protein,  water--limit salt, fat, and sugar intake) and increase physical activity as tolerated.  I have personally reviewed and noted the following in the patient's chart:   . Medical and social history . Use of alcohol, tobacco or illicit drugs  . Current medications and supplements . Functional ability and status . Nutritional status . Physical activity . Advanced directives . List of other physicians . Vitals . Screenings to include cognitive, depression, and falls . Referrals and appointments  In addition, I have reviewed and discussed with patient certain preventive protocols, quality metrics, and best practice recommendations. A written personalized care plan for preventive services as well as general preventive health recommendations were provided to patient.     Michiel Cowboy, RN  04/24/2017  Medical screening examination/treatment/procedure(s) were performed by non-physician practitioner and as supervising physician I was immediately available for consultation/collaboration. I agree with above. Lew Dawes, MD

## 2017-04-24 NOTE — Patient Instructions (Addendum)
Continue doing brain stimulating activities (puzzles, reading, adult coloring books, staying active) to keep memory sharp.   Continue to eat heart healthy diet (full of fruits, vegetables, whole grains, lean protein, water--limit salt, fat, and sugar intake) and increase physical activity as tolerated.   Troy Marsh , Thank you for taking time to come for your Medicare Wellness Visit. I appreciate your ongoing commitment to your health goals. Please review the following plan we discussed and let me know if I can assist you in the future.   These are the goals we discussed: Goals    . <enter goal here>    . Take time to relax more.          Put into my schedule to go to my rocking-chair and have complete quite time relaxation.       This is a list of the screening recommended for you and due dates:  Health Maintenance  Topic Date Due  .  Hepatitis C: One time screening is recommended by Center for Disease Control  (CDC) for  adults born from [birth year] through 1966.   02-May-1951  . Flu Shot  02/14/2017  . Colon Cancer Screening  04/08/2024  . Tetanus Vaccine  12/29/2026  . Pneumonia vaccines  Completed    Influenza Virus Vaccine injection What is this medicine? INFLUENZA VIRUS VACCINE (in floo EN zuh VAHY ruhs vak SEEN) helps to reduce the risk of getting influenza also known as the flu. The vaccine only helps protect you against some strains of the flu. This medicine may be used for other purposes; ask your health care provider or pharmacist if you have questions. COMMON BRAND NAME(S): Afluria, Agriflu, Alfuria, FLUAD, Fluarix, Fluarix Quadrivalent, Flublok, Flublok Quadrivalent, FLUCELVAX, Flulaval, Fluvirin, Fluzone, Fluzone High-Dose, Fluzone Intradermal What should I tell my health care provider before I take this medicine? They need to know if you have any of these conditions: -bleeding disorder like hemophilia -fever or infection -Guillain-Barre syndrome or other neurological  problems -immune system problems -infection with the human immunodeficiency virus (HIV) or AIDS -low blood platelet counts -multiple sclerosis -an unusual or allergic reaction to influenza virus vaccine, latex, other medicines, foods, dyes, or preservatives. Different brands of vaccines contain different allergens. Some may contain latex or eggs. Talk to your doctor about your allergies to make sure that you get the right vaccine. -pregnant or trying to get pregnant -breast-feeding How should I use this medicine? This vaccine is for injection into a muscle or under the skin. It is given by a health care professional. A copy of Vaccine Information Statements will be given before each vaccination. Read this sheet carefully each time. The sheet may change frequently. Talk to your healthcare provider to see which vaccines are right for you. Some vaccines should not be used in all age groups. Overdosage: If you think you have taken too much of this medicine contact a poison control center or emergency room at once. NOTE: This medicine is only for you. Do not share this medicine with others. What if I miss a dose? This does not apply. What may interact with this medicine? -chemotherapy or radiation therapy -medicines that lower your immune system like etanercept, anakinra, infliximab, and adalimumab -medicines that treat or prevent blood clots like warfarin -phenytoin -steroid medicines like prednisone or cortisone -theophylline -vaccines This list may not describe all possible interactions. Give your health care provider a list of all the medicines, herbs, non-prescription drugs, or dietary supplements you use. Also tell them  if you smoke, drink alcohol, or use illegal drugs. Some items may interact with your medicine. What should I watch for while using this medicine? Report any side effects that do not go away within 3 days to your doctor or health care professional. Call your health care  provider if any unusual symptoms occur within 6 weeks of receiving this vaccine. You may still catch the flu, but the illness is not usually as bad. You cannot get the flu from the vaccine. The vaccine will not protect against colds or other illnesses that may cause fever. The vaccine is needed every year. What side effects may I notice from receiving this medicine? Side effects that you should report to your doctor or health care professional as soon as possible: -allergic reactions like skin rash, itching or hives, swelling of the face, lips, or tongue Side effects that usually do not require medical attention (report to your doctor or health care professional if they continue or are bothersome): -fever -headache -muscle aches and pains -pain, tenderness, redness, or swelling at the injection site -tiredness This list may not describe all possible side effects. Call your doctor for medical advice about side effects. You may report side effects to FDA at 1-800-FDA-1088. Where should I keep my medicine? The vaccine will be given by a health care professional in a clinic, pharmacy, doctor's office, or other health care setting. You will not be given vaccine doses to store at home. NOTE: This sheet is a summary. It may not cover all possible information. If you have questions about this medicine, talk to your doctor, pharmacist, or health care provider.  2018 Elsevier/Gold Standard (2015-01-22 10:07:28)

## 2017-04-24 NOTE — Assessment & Plan Note (Signed)
Worse Claritin D Added Singulair

## 2017-04-25 ENCOUNTER — Other Ambulatory Visit: Payer: Self-pay | Admitting: Internal Medicine

## 2017-04-25 MED ORDER — POTASSIUM CHLORIDE ER 10 MEQ PO TBCR: 10.0000 meq | EXTENDED_RELEASE_TABLET | Freq: Every day | ORAL | 11 refills | Status: DC

## 2017-05-01 ENCOUNTER — Encounter: Payer: Self-pay | Admitting: Internal Medicine

## 2017-05-01 ENCOUNTER — Other Ambulatory Visit: Payer: Self-pay | Admitting: Internal Medicine

## 2017-05-01 ENCOUNTER — Telehealth: Payer: Self-pay | Admitting: Internal Medicine

## 2017-05-01 MED ORDER — BACLOFEN 10 MG PO TABS: 10.0000 mg | ORAL_TABLET | Freq: Two times a day (BID) | ORAL | 0 refills | Status: DC | PRN

## 2017-05-01 NOTE — Telephone Encounter (Signed)
Pt called stating that he just received his last refill on baclofen (LIORESAL) 10 MG tablet and would like a new script sent over so that it will be at the pharmacy when he is out.

## 2017-05-01 NOTE — Telephone Encounter (Signed)
Reviewed chart pt is up-to-date sent refills to [facility].../lmb  

## 2017-05-02 MED ORDER — BACLOFEN 10 MG PO TABS: 10.0000 mg | ORAL_TABLET | Freq: Two times a day (BID) | ORAL | 0 refills | Status: DC | PRN

## 2017-05-06 ENCOUNTER — Encounter: Payer: Self-pay | Admitting: Internal Medicine

## 2017-05-11 MED ORDER — POTASSIUM CHLORIDE ER 10 MEQ PO TBCR: 10.0000 meq | EXTENDED_RELEASE_TABLET | Freq: Every day | ORAL | 3 refills | Status: DC

## 2017-05-13 ENCOUNTER — Other Ambulatory Visit: Payer: Self-pay | Admitting: Internal Medicine

## 2017-05-16 ENCOUNTER — Encounter: Payer: Self-pay | Admitting: Internal Medicine

## 2017-05-16 MED ORDER — MONTELUKAST SODIUM 10 MG PO TABS: 10.0000 mg | ORAL_TABLET | Freq: Every day | ORAL | 2 refills | Status: DC

## 2017-05-17 ENCOUNTER — Encounter: Payer: Self-pay | Admitting: Internal Medicine

## 2017-05-18 ENCOUNTER — Other Ambulatory Visit: Payer: Self-pay | Admitting: Internal Medicine

## 2017-05-18 ENCOUNTER — Telehealth: Payer: Self-pay | Admitting: Internal Medicine

## 2017-05-18 MED ORDER — TESTOSTERONE ENANTHATE 200 MG/ML IM SOLN: INTRAMUSCULAR | 5 refills | Status: DC

## 2017-05-18 NOTE — Telephone Encounter (Signed)
[facility] called regarding the CAVERJECT 20 MCG SOLR prescription that was sent. They would like a new prescription for Bacteria Static Water (12ml per billion) along with Syringes and Needles (1-73ml with a 25-30 gauge 1/2 inch needle). They requested that the directions be changed to administer a max of 3 uses per week with at least 24 hours in between each dose.  REF# [id number]

## 2017-05-20 ENCOUNTER — Encounter: Payer: Self-pay | Admitting: Internal Medicine

## 2017-05-21 MED ORDER — STERILE WATER FOR INJECTION IJ SOLN: INTRAMUSCULAR | 1 refills | Status: DC

## 2017-05-21 MED ORDER — "SYRINGE/NEEDLE (DISP) 27G X 1/2"" 1 ML MISC": 0 refills | Status: DC

## 2017-05-21 NOTE — Telephone Encounter (Signed)
[facility] called regarding this, They need a Rx for Bacteria Static Water (29ml per billion) along with Syringes and Needles (1-23ml with a 25-30 gauge 1/2 inch needle Please advise and send to [facility]

## 2017-05-21 NOTE — Telephone Encounter (Signed)
RX sent

## 2017-05-23 ENCOUNTER — Encounter: Payer: Self-pay | Admitting: Internal Medicine

## 2017-05-23 ENCOUNTER — Other Ambulatory Visit: Payer: Self-pay | Admitting: Internal Medicine

## 2017-05-23 ENCOUNTER — Ambulatory Visit (INDEPENDENT_AMBULATORY_CARE_PROVIDER_SITE_OTHER): Payer: Medicare Other | Admitting: Internal Medicine

## 2017-05-23 VITALS — BP 138/90 | HR 88 | Ht 69.0 in | Wt 153.5 lb

## 2017-05-23 DIAGNOSIS — K625 Hemorrhage of anus and rectum: Secondary | ICD-10-CM | POA: Diagnosis not present

## 2017-05-23 DIAGNOSIS — K602 Anal fissure, unspecified: Secondary | ICD-10-CM

## 2017-05-23 DIAGNOSIS — K5900 Constipation, unspecified: Secondary | ICD-10-CM

## 2017-05-23 MED ORDER — DILTIAZEM GEL 2 %: CUTANEOUS | 2 refills | Status: DC

## 2017-05-23 MED ORDER — ALPROSTADIL (VASODILATOR) 20 MCG IC SOLR: INTRACAVERNOUS | 2 refills | Status: DC

## 2017-05-23 NOTE — Patient Instructions (Addendum)
We have sent the following medications to [facility] for you to pick up at your convenience: Diltiazem gel  [facility]     Phone: [phone]  Increase your Miralax and fiber intake  Sitz baths

## 2017-05-23 NOTE — Progress Notes (Signed)
HISTORY OF PRESENT ILLNESS:  Troy Marsh is a 66 y.o. male with a history of HIV disease, hypertension, and hypogonadism. Previous colonoscopy in 2003 in 2015 were normal. Last seen here in November 2016 for internal hemorrhoids with associated bleeding. As well constipation. He was treated symptomatically with fiber, MiraLAX, and hydrocortisone suppositories. He presents at this time with chief complaint of rectal bleeding and rectal discomfort. Rectal bleeding occurs with most every bowel movement. He thinks he might have a hemorrhoid. Review of outside blood work finds hemoglobin 16.1 last month. He does continue with significant constipation. Uses MiraLAX once daily. Despite this has a bowel movement about twice per week. He strains.  REVIEW OF SYSTEMS:  All non-GI ROS negative except for back pain  Past Medical History:  Diagnosis Date  . HIV disease ([place]) 1985   last CD4 10/02/12 was 277  . Hypertension   . Hypogonadism male     Past Surgical History:  Procedure Laterality Date  . [place]  . KNEE ARTHROSCOPY Right 2009  . KNEE ARTHROSCOPY Left 2011  . neck surgery  1984    Social History Troy Marsh  reports that he quit smoking about 13 years ago. His smoking use included cigarettes. He has a 70.00 pack-year smoking history. he has never used smokeless tobacco. He reports that he does not drink alcohol or use drugs.  family history includes Cancer in his father; Coronary artery disease in his father; Heart attack in his father; Hyperlipidemia in his father; Hypertension in his father.  No Known Allergies     PHYSICAL EXAMINATION: Vital signs: BP 138/90 (BP Location: Left Arm, Patient Position: Sitting, Cuff Size: Normal)   Pulse 88   Ht 5\' 9"  (1.753 m) Comment: height measured without shoes  Wt 153 lb 8 oz (69.6 kg)   BMI 22.67 kg/m   Constitutional: generally well-appearing, no acute distress Psychiatric: alert and oriented x3,  cooperative Eyes: extraocular movements intact, anicteric, conjunctiva pink Mouth: oral pharynx moist, no lesions Neck: supple no lymphadenopathy Cardiovascular: heart regular rate and rhythm, no murmur Lungs: clear to auscultation bilaterally Abdomen: soft, nontender, nondistended, no obvious ascites, no peritoneal signs, normal bowel sounds, no organomegaly Rectal: No external abnormalities. Posterior fissure. Extremities: no lower extremity edema bilaterally Skin: no lesions on visible extremities Neuro: No focal deficits. Creel nerves intact. No asterixis.    ASSESSMENT:  #1. Posterior fissure #2. Rectal bleeding and discomfort secondary to fissure #3. Normal colonoscopy 2003 in 2015 #4. Chronic constipation. Remains problematic    PLAN:  #1. Increase fiber #2. Increase MiraLAX #3. Sitz baths #4. Prescribe diltiazem 2%. Apply 5 times daily for 6 weeks. Instructed as to the proper technique #5. Educational discussion on the etiology of anal fissure and its treatment as well as the rationale behind treatment. #6. Educational information on fissure provided #7. Consider surgery if the problem persists despite maximal medical therapy #8. Due for routine screening colonoscopy 2025 25 minutes spent face-to-face with the patient. Greater than 50% a time use for counseling regarding his problems with anal fissure, constipation and their management

## 2017-05-28 ENCOUNTER — Encounter: Payer: Self-pay | Admitting: Internal Medicine

## 2017-05-30 ENCOUNTER — Other Ambulatory Visit: Payer: Self-pay | Admitting: Internal Medicine

## 2017-06-01 ENCOUNTER — Encounter: Payer: Self-pay | Admitting: Internal Medicine

## 2017-06-07 ENCOUNTER — Other Ambulatory Visit: Payer: Self-pay | Admitting: Internal Medicine

## 2017-06-28 ENCOUNTER — Encounter: Payer: Self-pay | Admitting: Internal Medicine

## 2017-06-29 ENCOUNTER — Other Ambulatory Visit: Payer: Self-pay | Admitting: Orthopedic Surgery

## 2017-06-29 DIAGNOSIS — G8929 Other chronic pain: Secondary | ICD-10-CM

## 2017-06-29 DIAGNOSIS — M545 Low back pain: Principal | ICD-10-CM

## 2017-07-12 ENCOUNTER — Ambulatory Visit
Admission: RE | Admit: 2017-07-12 | Discharge: 2017-07-12 | Disposition: A | Discharge status: Home / Self Care | Payer: Medicare Other | Source: Ambulatory Visit | Attending: Orthopedic Surgery | Admitting: Orthopedic Surgery

## 2017-07-12 DIAGNOSIS — G8929 Other chronic pain: Secondary | ICD-10-CM

## 2017-07-12 DIAGNOSIS — M545 Low back pain: Principal | ICD-10-CM

## 2017-07-12 MED ORDER — IOPAMIDOL (ISOVUE-M 200) INJECTION 41%
1.0000 mL | Freq: Once | INTRAMUSCULAR | Status: AC
  Administered 2017-07-12: 1 mL via EPIDURAL

## 2017-07-12 MED ORDER — METHYLPREDNISOLONE ACETATE 40 MG/ML INJ SUSP (RADIOLOG
120.0000 mg | Freq: Once | INTRAMUSCULAR | Status: AC
  Administered 2017-07-12: 120 mg via EPIDURAL

## 2017-07-18 ENCOUNTER — Other Ambulatory Visit: Payer: Self-pay | Admitting: Orthopedic Surgery

## 2017-07-18 DIAGNOSIS — M5136 Other intervertebral disc degeneration, lumbar region: Secondary | ICD-10-CM

## 2017-09-03 ENCOUNTER — Ambulatory Visit
Admission: RE | Admit: 2017-09-03 | Discharge: 2017-09-03 | Disposition: A | Discharge status: Home / Self Care | Payer: Medicare Other | Source: Ambulatory Visit | Attending: Orthopedic Surgery | Admitting: Orthopedic Surgery

## 2017-09-03 DIAGNOSIS — M5136 Other intervertebral disc degeneration, lumbar region: Secondary | ICD-10-CM

## 2017-09-03 MED ORDER — METHYLPREDNISOLONE ACETATE 40 MG/ML INJ SUSP (RADIOLOG
120.0000 mg | Freq: Once | INTRAMUSCULAR | Status: AC
  Administered 2017-09-03: 120 mg via EPIDURAL

## 2017-09-03 MED ORDER — IOPAMIDOL (ISOVUE-M 200) INJECTION 41%
1.0000 mL | Freq: Once | INTRAMUSCULAR | Status: AC
  Administered 2017-09-03: 1 mL via EPIDURAL

## 2017-10-03 ENCOUNTER — Other Ambulatory Visit: Payer: Self-pay | Admitting: Internal Medicine

## 2017-10-03 ENCOUNTER — Encounter: Payer: Self-pay | Admitting: Internal Medicine

## 2017-10-04 MED ORDER — CELECOXIB 200 MG PO CAPS: 200.0000 mg | ORAL_CAPSULE | Freq: Two times a day (BID) | ORAL | 1 refills | Status: DC

## 2017-10-05 ENCOUNTER — Encounter: Payer: Self-pay | Admitting: Internal Medicine

## 2017-10-08 ENCOUNTER — Other Ambulatory Visit: Payer: Self-pay | Admitting: Internal Medicine

## 2017-10-08 ENCOUNTER — Encounter: Payer: Self-pay | Admitting: Internal Medicine

## 2017-10-08 MED ORDER — TESTOSTERONE ENANTHATE 200 MG/ML IM SOLN: INTRAMUSCULAR | 5 refills | Status: DC

## 2017-10-08 NOTE — Telephone Encounter (Signed)
Copied from [place]. Topic: Quick Communication - Rx Refill/Question >> Oct 08, 2017 12:44 PM Troy Marsh wrote: Medication: testosterone enanthate (DELATESTRYL) 200 MG/ML injection [[id number]]  Has the patient contacted their pharmacy? Yes.   (Agent: If no, request that the patient contact the pharmacy for the refill.) Preferred Pharmacy (with phone number or street name): [facility] [phone] Agent: Please be advised that RX refills may take up to 3 business days. We ask that you follow-up with your pharmacy.

## 2017-10-09 ENCOUNTER — Encounter: Payer: Self-pay | Admitting: Internal Medicine

## 2017-10-09 MED ORDER — TESTOSTERONE ENANTHATE 200 MG/ML IM SOLN: INTRAMUSCULAR | 5 refills | Status: DC

## 2017-10-09 NOTE — Telephone Encounter (Signed)
Rx refill request: Delatestryl 200 mg/ml  LOV: 04/24/17   PCP: Plotnikov  [facility]

## 2017-10-10 NOTE — Telephone Encounter (Signed)
Faxed script to [facility] @ [phone].Marland KitchenJohny Marsh

## 2017-10-11 ENCOUNTER — Other Ambulatory Visit: Payer: Self-pay | Admitting: Internal Medicine

## 2017-10-11 MED ORDER — TESTOSTERONE ENANTHATE 200 MG/ML IM SOLN: INTRAMUSCULAR | 5 refills | Status: DC

## 2017-10-12 ENCOUNTER — Other Ambulatory Visit: Payer: Self-pay | Admitting: Internal Medicine

## 2017-10-17 ENCOUNTER — Other Ambulatory Visit: Payer: Self-pay | Admitting: Orthopedic Surgery

## 2017-10-17 ENCOUNTER — Telehealth: Payer: Self-pay | Admitting: Internal Medicine

## 2017-10-17 DIAGNOSIS — M47816 Spondylosis without myelopathy or radiculopathy, lumbar region: Secondary | ICD-10-CM

## 2017-10-17 NOTE — Telephone Encounter (Signed)
Copied from [place] [id number]. Topic: Quick Communication - See Telephone Encounter >> Oct 17, 2017 11:24 AM Ether Griffins B wrote: CRM for notification. See Telephone encounter for: 10/17/17. [facility] [place] calling in regards to the testosterone enanthate (DELATESTRYL) 200 MG/ML injection. I verified the instructions with them but they are wanting to make the provider aware they are only able to do 4 refills by law. Please call back to confirm at [phone].

## 2017-10-18 NOTE — Telephone Encounter (Signed)
Done

## 2017-10-23 ENCOUNTER — Ambulatory Visit (INDEPENDENT_AMBULATORY_CARE_PROVIDER_SITE_OTHER): Payer: Medicare Other | Admitting: Internal Medicine

## 2017-10-23 ENCOUNTER — Other Ambulatory Visit (INDEPENDENT_AMBULATORY_CARE_PROVIDER_SITE_OTHER): Payer: Medicare Other

## 2017-10-23 ENCOUNTER — Encounter: Payer: Self-pay | Admitting: Internal Medicine

## 2017-10-23 DIAGNOSIS — I10 Essential (primary) hypertension: Secondary | ICD-10-CM | POA: Diagnosis not present

## 2017-10-23 DIAGNOSIS — E291 Testicular hypofunction: Secondary | ICD-10-CM

## 2017-10-23 DIAGNOSIS — N528 Other male erectile dysfunction: Secondary | ICD-10-CM

## 2017-10-23 DIAGNOSIS — M542 Cervicalgia: Secondary | ICD-10-CM

## 2017-10-23 DIAGNOSIS — Z21 Asymptomatic human immunodeficiency virus [HIV] infection status: Secondary | ICD-10-CM

## 2017-10-23 LAB — TESTOSTERONE: Testosterone: 650.42 ng/dL (ref 300.00–890.00)

## 2017-10-23 LAB — URINALYSIS
Bilirubin Urine: NEGATIVE
Hgb urine dipstick: NEGATIVE
KETONES UR: NEGATIVE
Leukocytes, UA: NEGATIVE
Nitrite: NEGATIVE
SPECIFIC GRAVITY, URINE: 1.01 (ref 1.000–1.030)
TOTAL PROTEIN, URINE-UPE24: NEGATIVE
URINE GLUCOSE: NEGATIVE
Urobilinogen, UA: 0.2 (ref 0.0–1.0)
pH: 7.5 (ref 5.0–8.0)

## 2017-10-23 LAB — PSA: PSA: 1.4 ng/mL (ref 0.10–4.00)

## 2017-10-23 LAB — TSH: TSH: 3.21 u[IU]/mL (ref 0.35–4.50)

## 2017-10-23 NOTE — Assessment & Plan Note (Signed)
Testosterone replacment q 2 weeks IM and using caverject

## 2017-10-23 NOTE — Assessment & Plan Note (Signed)
F/u w/Dr Allen Norris

## 2017-10-23 NOTE — Assessment & Plan Note (Signed)
Cialis

## 2017-10-23 NOTE — Assessment & Plan Note (Addendum)
NAS HCTZ Kdur

## 2017-10-23 NOTE — Assessment & Plan Note (Signed)
On testosterone 

## 2017-10-23 NOTE — Assessment & Plan Note (Signed)
2019 - surgery 5/19

## 2017-10-23 NOTE — Progress Notes (Signed)
Subjective:  Patient ID: Troy Marsh, male    DOB: 14-Nov-1950  Age: 67 y.o. MRN: [id number]  CC: No chief complaint on file.   HPI Troy Marsh presents for hypogonadism, HTN, ED, HIV f/u C/o neck pain  Outpatient Medications Prior to Visit  Medication Sig Dispense Refill  . Alprostadil, Vasodilator, (CAVERJECT) 20 MCG SOLR INJECT 1 DOSE (PENILE  INJECTION) EVERY DAY AS  NEEDED AS DIRECTED 18 each 2  . ANUCORT-HC 25 MG suppository UNWRAP AND INSERT 1  SUPPOSITORY RECTALLY AT  BEDTIME AS NEEDED FOR  HEMORRHOIDS OR ITCHING 24 suppository 3  . aspirin 81 MG tablet Take 81 mg by mouth daily.      . baclofen (LIORESAL) 10 MG tablet Take 1 tablet (10 mg total) by mouth 2 (two) times daily as needed. 90 each 0  . celecoxib (CELEBREX) 200 MG capsule Take 1 capsule (200 mg total) by mouth 2 (two) times daily. 180 capsule 1  . cyclobenzaprine (FLEXERIL) 10 MG tablet Take 1 tablet (10 mg total) by mouth 2 (two) times daily as needed for muscle spasms. 10 tablet 0  . diltiazem 2 % GEL Apply 5 times a day for 6 weeks 1 Package 2  . emtricitabine-rilpivir-tenofovir AF (ODEFSEY) 200-25-25 MG TABS tablet Take 1 tablet by mouth daily.    . hydrochlorothiazide (HYDRODIURIL) 50 MG tablet TAKE 1 TABLET BY MOUTH  DAILY 90 tablet 2  . HYDROcodone-acetaminophen (NORCO) 7.5-325 MG per tablet Take 1 tablet by mouth daily. Patient states he takes 1/2 to 1 tablet daily.    [name] ibuprofen (ADVIL,MOTRIN) 800 MG tablet Take 1 tablet (800 mg total) by mouth 3 (three) times daily. 21 tablet 0  . ipratropium (ATROVENT) 0.06 % nasal spray Place 2 sprays into both nostrils 4 (four) times daily. 15 mL 1  . Melatonin 5 MG TABS Take by mouth.      . mirabegron ER (MYRBETRIQ) 25 MG TB24 tablet Take by mouth.    . montelukast (SINGULAIR) 10 MG tablet TAKE 1 TABLET BY MOUTH  DAILY 90 tablet 2  . multivitamin (THERAGRAN) per tablet Take 1 tablet by mouth daily.      . potassium chloride (K-DUR) 10 MEQ tablet TAKE 1  TABLET BY MOUTH  DAILY 90 tablet 3  . RESTASIS 0.05 % ophthalmic emulsion Place 1 drop into both eyes 2 (two) times daily.    . tadalafil (CIALIS) 5 MG tablet TAKE 1 TABLET EVERY DAY (Patient taking differently: 10 mg. TAKE 1 TABLET EVERY DAY) 90 tablet 3  . testosterone enanthate (DELATESTRYL) 200 MG/ML injection INJECT 1 ML INTRAMUSCULARLY EVERY 14 DAYS. 5 mL 5  . atorvastatin (LIPITOR) 40 MG tablet Take 40 mg by mouth.    . gabapentin (NEURONTIN) 100 MG capsule Take 1 capsule (100 mg total) by mouth 3 (three) times daily. 42 capsule 0   No facility-administered medications prior to visit.     ROS Review of Systems  Constitutional: Negative for appetite change, fatigue and unexpected weight change.  HENT: Negative for congestion, nosebleeds, sneezing, sore throat and trouble swallowing.   Eyes: Negative for itching and visual disturbance.  Respiratory: Negative for cough.   Cardiovascular: Negative for chest pain, palpitations and leg swelling.  Gastrointestinal: Negative for abdominal distention, blood in stool, diarrhea and nausea.  Genitourinary: Negative for frequency and hematuria.  Musculoskeletal: Positive for back pain, neck pain and neck stiffness. Negative for gait problem and joint swelling.  Skin: Negative for rash.  Neurological: Negative for dizziness,  tremors, speech difficulty and weakness.  Psychiatric/Behavioral: Negative for agitation, dysphoric mood and sleep disturbance. The patient is not nervous/anxious.     Objective:  BP 134/84 (BP Location: Left Arm, Patient Position: Sitting, Cuff Size: Normal)   Pulse (!) 59   Temp 97.9 F (36.6 C) (Oral)   Ht 5\' 9"  (1.753 m)   Wt 147 lb (66.7 kg)   SpO2 99%   BMI 21.71 kg/m   BP Readings from Last 3 Encounters:  10/23/17 134/84  09/03/17 (!) 152/74  07/12/17 (!) 174/88    Wt Readings from Last 3 Encounters:  10/23/17 147 lb (66.7 kg)  05/23/17 153 lb 8 oz (69.6 kg)  04/24/17 149 lb (67.6 kg)    Physical  Exam  Constitutional: He is oriented to person, place, and time. He appears well-developed. No distress.  NAD  HENT:  Mouth/Throat: Oropharynx is clear and moist.  Eyes: Pupils are equal, round, and reactive to light. Conjunctivae are normal.  Neck: Normal range of motion. No JVD present. No thyromegaly present.  Cardiovascular: Normal rate, regular rhythm, normal heart sounds and intact distal pulses. Exam reveals no gallop and no friction rub.  No murmur heard. Pulmonary/Chest: Effort normal and breath sounds normal. No respiratory distress. He has no wheezes. He has no rales. He exhibits no tenderness.  Abdominal: Soft. Bowel sounds are normal. He exhibits no distension and no mass. There is no tenderness. There is no rebound and no guarding.  Musculoskeletal: Normal range of motion. He exhibits tenderness. He exhibits no edema.  Lymphadenopathy:    He has no cervical adenopathy.  Neurological: He is alert and oriented to person, place, and time. He has normal reflexes. No cranial nerve deficit. He exhibits normal muscle tone. He displays a negative Romberg sign. Coordination and gait normal.  Skin: Skin is warm and dry. No rash noted.  Psychiatric: He has a normal mood and affect. His behavior is normal. Judgment and thought content normal.   AKs Neck tender w/ROM  Lab Results  Component Value Date   WBC 5.5 04/24/2017   HGB 16.1 04/24/2017   HCT 46.1 04/24/2017   PLT 151.0 04/24/2017   GLUCOSE 94 04/24/2017   CHOL 160 04/24/2017   TRIG 61.0 04/24/2017   HDL 80.70 04/24/2017   LDLDIRECT 113.3 05/21/2006   LDLCALC 67 04/24/2017   ALT 17 04/24/2017   AST 20 04/24/2017   NA 131 (L) 04/24/2017   K 3.3 (L) 04/24/2017   CL 91 (L) 04/24/2017   CREATININE 1.15 04/24/2017   BUN 12 04/24/2017   CO2 34 (H) 04/24/2017   TSH 2.66 04/24/2017   PSA 1.16 04/24/2017    Dg Inject Diag/thera/inc Needle/cath/plc Epi/lumb/sac W/img  Result Date: 09/03/2017 CLINICAL DATA:  Degenerative  disc disease. Displacement of the L3-4, L4-5, and L5 lumbar disc. Left lower extremity radiculopathy. FLUOROSCOPY TIME:  Radiation Exposure Index (as provided by the fluoroscopic device): 20.42 uGy*m2 Fluoroscopy Time:  19 seconds Number of Acquired Images:  0 PROCEDURE: The procedure, risks, benefits, and alternatives were explained to the patient. Questions regarding the procedure were encouraged and answered. The patient understands and consents to the procedure. LUMBAR EPIDURAL INJECTION: An interlaminar approach was performed on left at L4-5. The overlying skin was cleansed and anesthetized. A 20 gauge epidural needle was advanced using loss-of-resistance technique. DIAGNOSTIC EPIDURAL INJECTION: Injection of Isovue-M 200 shows a good epidural pattern with spread above and below the level of needle placement, primarily on the left no vascular opacification is seen.  THERAPEUTIC EPIDURAL INJECTION: 120 mg of Depo-Medrol mixed with 3 mL 1% lidocaine were instilled. The procedure was well-tolerated, and the patient was discharged thirty minutes following the injection in good condition. COMPLICATIONS: None IMPRESSION: Technically successful epidural injection on the left L4-5 # 1 Electronically Signed   By: San Morelle M.D.   On: 09/03/2017 14:19    Assessment & Plan:     Follow-up: No follow-ups on file.  [name], MD

## 2017-11-01 ENCOUNTER — Ambulatory Visit
Admission: RE | Admit: 2017-11-01 | Discharge: 2017-11-01 | Disposition: A | Discharge status: Home / Self Care | Payer: Medicare Other | Source: Ambulatory Visit | Attending: Orthopedic Surgery | Admitting: Orthopedic Surgery

## 2017-11-01 DIAGNOSIS — M47816 Spondylosis without myelopathy or radiculopathy, lumbar region: Secondary | ICD-10-CM

## 2017-11-01 MED ORDER — IOPAMIDOL (ISOVUE-M 200) INJECTION 41%
1.0000 mL | Freq: Once | INTRAMUSCULAR | Status: AC
  Administered 2017-11-01: 1 mL via EPIDURAL

## 2017-11-01 MED ORDER — METHYLPREDNISOLONE ACETATE 40 MG/ML INJ SUSP (RADIOLOG
120.0000 mg | Freq: Once | INTRAMUSCULAR | Status: AC
  Administered 2017-11-01: 120 mg via EPIDURAL

## 2017-11-12 ENCOUNTER — Other Ambulatory Visit: Payer: Self-pay | Admitting: Orthopedic Surgery

## 2017-11-12 NOTE — Pre-Procedure Instructions (Signed)
Troy Marsh  11/12/2017      Your procedure is scheduled on Thursday, May 9.  Report to [facility] Admitting at 5:30 AM                Your surgery or procedure is scheduled for 7:30 AM   Call this number if you have problems the morning of surgery [phone] pre- op desk.    Remember:  Do not eat food or drink liquids after midnight Wednesday, May 8.  Take these medicines the morning of surgery with A SIP OF WATER : gabapentin (NEURONTIN).  May take if needed: HYDROcodone-acetaminophen (NORCO) baclofen (LIORESAL)   STOP taking Aspirin, Aspirin Products (Goody Powder, Excedrin Migraine), Ibuprofen (Advil), Naproxen (Aleve), Vitamins and Herbal Products (ie Fish Oil)  Special instructions:  [facility]- Preparing For Surgery  Before surgery, you can play an important role. Because skin is not sterile, your skin needs to be as free of germs as possible. You can reduce the number of germs on your skin by washing with CHG (chlorahexidine gluconate) Soap before surgery.  CHG is an antiseptic cleaner which kills germs and bonds with the skin to continue killing germs even after washing.  Please do not use if you have an allergy to CHG or antibacterial soaps. If your skin becomes reddened/irritated stop using the CHG.  Do not shave (including legs and underarms) for at least 48 hours prior to first CHG shower. It is OK to shave your face.  Please follow these instructions carefully.   1. Shower the NIGHT BEFORE SURGERY and the MORNING OF SURGERY with CHG.   2. If you chose to wash your hair, wash your hair first as usual with your normal shampoo.  3. After you shampoo, rinse your hair and body thoroughly to remove the shampoo.  4. Use CHG as you would any other liquid soap. You can apply CHG directly to the skin and wash gently with a scrungie or a clean washcloth.   5. Apply the CHG Soap to your body ONLY FROM THE NECK DOWN.  Do not use on open wounds or  open sores. Avoid contact with your eyes, ears, mouth and genitals (private parts). Wash Face and genitals (private parts)  with your normal soap.  6. Wash thoroughly, paying special attention to the area where your surgery will be performed.  7. Thoroughly rinse your body with warm water from the neck down.  8. DO NOT shower/wash with your normal soap after using and rinsing off the CHG Soap.  9. Pat yourself dry with a CLEAN TOWEL.  10. Wear CLEAN PAJAMAS to bed the night before surgery, wear comfortable clothes the morning of surgery  11. Place CLEAN SHEETS on your bed the night of your first shower and DO NOT SLEEP WITH PETS.  Day of Surgery: Do not apply any deodorants/lotions,  Powders or colognes. Please wear clean clothes to the hospital/surgery. Do not wear jewelry, make-up or nail polish.  Do not shave 48 hours prior to surgery.  Men may shave face and neck.  Do not bring valuables to the hospital.  [facility] is not responsible for any belongings or valuables.  Contacts, dentures or bridgework may not be worn into surgery.  Leave your suitcase in the car.  After surgery it may be brought to your room.  For patients admitted to the hospital, discharge time will be determined by your treatment team.  Patients discharged the day of surgery will  not be allowed to drive home.   Please read over the following fact sheets that you were given: Pain Booklet, Patient Instructions for Mupirocin Application, Incentive Spirometry, Surgical Site Infections.

## 2017-11-12 NOTE — Pre-Procedure Instructions (Signed)
Troy Marsh  11/12/2017      Your procedure is scheduled on Thursday, May 9.  Report to [facility] Admitting at 5:30 AM                Your surgery or procedure is scheduled for 7:30 AM   Call this number if you have problems the morning of surgery [phone] pre- op desk.    Remember:  Do not eat food or drink liquids after midnight Wednesday, May 8.  Take these medicines the morning of surgery with A SIP OF WATER : gabapentin (NEURONTIN).  May take if needed: HYDROcodone-acetaminophen (NORCO) baclofen (LIORESAL)   STOP taking Aspirin, Aspirin Products (Goody Powder, Excedrin Migraine), Ibuprofen (Advil), Naproxen (Aleve), Vitamins and Herbal Products (ie Fish Oil)  Special instructions:  [name]- Preparing For Surgery  Before surgery, you can play an important role. Because skin is not sterile, your skin needs to be as free of germs as possible. You can reduce the number of germs on your skin by washing with CHG (chlorahexidine gluconate) Soap before surgery.  CHG is an antiseptic cleaner which kills germs and bonds with the skin to continue killing germs even after washing.  Please do not use if you have an allergy to CHG or antibacterial soaps. If your skin becomes reddened/irritated stop using the CHG.  Do not shave (including legs and underarms) for at least 48 hours prior to first CHG shower. It is OK to shave your face.  Please follow these instructions carefully.   1. Shower the NIGHT BEFORE SURGERY and the MORNING OF SURGERY with CHG.   2. If you chose to wash your hair, wash your hair first as usual with your normal shampoo.  3. After you shampoo, rinse your hair and body thoroughly to remove the shampoo.  4. Use CHG as you would any other liquid soap. You can apply CHG directly to the skin and wash gently with a scrungie or a clean washcloth.   5. Apply the CHG Soap to your body ONLY FROM THE NECK DOWN.  Do not use on open wounds or  open sores. Avoid contact with your eyes, ears, mouth and genitals (private parts). Wash Face and genitals (private parts)  with your normal soap.  6. Wash thoroughly, paying special attention to the area where your surgery will be performed.  7. Thoroughly rinse your body with warm water from the neck down.  8. DO NOT shower/wash with your normal soap after using and rinsing off the CHG Soap.  9. Pat yourself dry with a CLEAN TOWEL.  10. Wear CLEAN PAJAMAS to bed the night before surgery, wear comfortable clothes the morning of surgery  11. Place CLEAN SHEETS on your bed the night of your first shower and DO NOT SLEEP WITH PETS.  Day of Surgery: Do not apply any deodorants/lotions,  Powders or colognes. Please wear clean clothes to the hospital/surgery. Do not wear jewelry, make-up or nail polish.  Do not shave 48 hours prior to surgery.  Men may shave face and neck.  Do not bring valuables to the hospital.  [facility] is not responsible for any belongings or valuables.  Contacts, dentures or bridgework may not be worn into surgery.  Leave your suitcase in the car.  After surgery it may be brought to your room.  For patients admitted to the hospital, discharge time will be determined by your treatment team.  Patients discharged the day of surgery will  not be allowed to drive home.   Please read over the following fact sheets that you were given: Pain Booklet, Patient Instructions for Mupirocin Application, Incentive Spirometry, Surgical Site Infections.

## 2017-11-13 ENCOUNTER — Encounter (HOSPITAL_COMMUNITY)
Admission: RE | Admit: 2017-11-13 | Discharge: 2017-11-13 | Disposition: A | Discharge status: Home / Self Care | Payer: Medicare Other | Source: Ambulatory Visit | Attending: Orthopedic Surgery | Admitting: Orthopedic Surgery

## 2017-11-13 ENCOUNTER — Other Ambulatory Visit: Payer: Self-pay

## 2017-11-13 ENCOUNTER — Encounter (HOSPITAL_COMMUNITY): Payer: Self-pay

## 2017-11-13 DIAGNOSIS — I4510 Unspecified right bundle-branch block: Secondary | ICD-10-CM | POA: Insufficient documentation

## 2017-11-13 DIAGNOSIS — I455 Other specified heart block: Secondary | ICD-10-CM | POA: Diagnosis not present

## 2017-11-13 DIAGNOSIS — Z01818 Encounter for other preprocedural examination: Secondary | ICD-10-CM | POA: Diagnosis present

## 2017-11-13 DIAGNOSIS — Z01812 Encounter for preprocedural laboratory examination: Secondary | ICD-10-CM | POA: Diagnosis present

## 2017-11-13 DIAGNOSIS — R9431 Abnormal electrocardiogram [ECG] [EKG]: Secondary | ICD-10-CM | POA: Diagnosis not present

## 2017-11-13 DIAGNOSIS — Z01810 Encounter for preprocedural cardiovascular examination: Secondary | ICD-10-CM | POA: Insufficient documentation

## 2017-11-13 HISTORY — DX: Malignant (primary) neoplasm, unspecified: C80.1

## 2017-11-13 HISTORY — DX: Drug induced constipation: K59.03

## 2017-11-13 HISTORY — DX: Unspecified osteoarthritis, unspecified site: M19.90

## 2017-11-13 LAB — URINALYSIS, ROUTINE W REFLEX MICROSCOPIC
Bilirubin Urine: NEGATIVE
GLUCOSE, UA: NEGATIVE mg/dL
HGB URINE DIPSTICK: NEGATIVE
Ketones, ur: NEGATIVE mg/dL
Leukocytes, UA: NEGATIVE
Nitrite: NEGATIVE
PH: 7 (ref 5.0–8.0)
PROTEIN: NEGATIVE mg/dL
Specific Gravity, Urine: 1.009 (ref 1.005–1.030)

## 2017-11-13 LAB — COMPREHENSIVE METABOLIC PANEL
ALK PHOS: 48 U/L (ref 38–126)
ALT: 24 U/L (ref 17–63)
AST: 26 U/L (ref 15–41)
Albumin: 3.9 g/dL (ref 3.5–5.0)
Anion gap: 9 (ref 5–15)
BUN: 7 mg/dL (ref 6–20)
CALCIUM: 9 mg/dL (ref 8.9–10.3)
CO2: 33 mmol/L — AB (ref 22–32)
CREATININE: 1.35 mg/dL — AB (ref 0.61–1.24)
Chloride: 94 mmol/L — ABNORMAL LOW (ref 101–111)
GFR, EST NON AFRICAN AMERICAN: 53 mL/min — AB (ref >60)
Glucose, Bld: 60 mg/dL — ABNORMAL LOW (ref 65–99)
Potassium: 3.4 mmol/L — ABNORMAL LOW (ref 3.5–5.1)
SODIUM: 136 mmol/L (ref 135–145)
Total Bilirubin: 0.8 mg/dL (ref 0.3–1.2)
Total Protein: 6.2 g/dL — ABNORMAL LOW (ref 6.5–8.1)

## 2017-11-13 LAB — CBC WITH DIFFERENTIAL/PLATELET
Basophils Absolute: 0 10*3/uL (ref 0.0–0.1)
Basophils Relative: 1 %
Eosinophils Absolute: 0 10*3/uL (ref 0.0–0.7)
Eosinophils Relative: 1 %
HCT: 50.6 % (ref 39.0–52.0)
HEMOGLOBIN: 17.8 g/dL — AB (ref 13.0–17.0)
LYMPHS PCT: 23 %
Lymphs Abs: 1.1 10*3/uL (ref 0.7–4.0)
MCH: 33 pg (ref 26.0–34.0)
MCHC: 35.2 g/dL (ref 30.0–36.0)
MCV: 93.9 fL (ref 78.0–100.0)
Monocytes Absolute: 0.6 10*3/uL (ref 0.1–1.0)
Monocytes Relative: 12 %
NEUTROS PCT: 63 %
Neutro Abs: 3.1 10*3/uL (ref 1.7–7.7)
Platelets: 147 10*3/uL — ABNORMAL LOW (ref 150–400)
RBC: 5.39 MIL/uL (ref 4.22–5.81)
RDW: 13.9 % (ref 11.5–15.5)
WBC: 4.8 10*3/uL (ref 4.0–10.5)

## 2017-11-13 LAB — TYPE AND SCREEN
ABO/RH(D): A POS
Antibody Screen: NEGATIVE

## 2017-11-13 LAB — APTT: aPTT: 31 seconds (ref 24–36)

## 2017-11-13 LAB — SURGICAL PCR SCREEN
MRSA, PCR: NEGATIVE
Staphylococcus aureus: NEGATIVE

## 2017-11-13 LAB — PROTIME-INR
INR: 1.03
Prothrombin Time: 13.4 seconds (ref 11.4–15.2)

## 2017-11-13 LAB — ABO/RH: ABO/RH(D): A POS

## 2017-11-13 NOTE — Progress Notes (Addendum)
Mr Belair denies chest pain or shortness of breath.  Patient reports that he walks 5  miles 6- 7 times a week, usually lifts weights 1 day a week. Mr Vanloan reports that blood pressure is always higher in left arm thtn right and that his PCP is aware.

## 2017-11-13 NOTE — Pre-Procedure Instructions (Signed)
Troy Marsh  11/13/2017      Your procedure is scheduled on Thursday, May 9.  Report to [facility] Admitting at 5:30 AM                Your surgery or procedure is scheduled for 7:30 AM   Call this number if you have problems the morning of surgery [phone] pre- op desk.    Remember:  Do not eat food or drink liquids after midnight Wednesday, May 8.  Take these medicines the morning of surgery with A SIP OF WATER : gabapentin (NEURONTIN).  May take if needed: HYDROcodone-acetaminophen (NORCO) baclofen (LIORESAL)   STOP taking Aspirin, Aspirin Products (Goody Powder, Excedrin Migraine), Ibuprofen (Advil), Naproxen (Aleve), Vitamins and Herbal Products (ie Fish Oil)  Special instructions:  [place]- Preparing For Surgery  Before surgery, you can play an important role. Because skin is not sterile, your skin needs to be as free of germs as possible. You can reduce the number of germs on your skin by washing with CHG (chlorahexidine gluconate) Soap before surgery.  CHG is an antiseptic cleaner which kills germs and bonds with the skin to continue killing germs even after washing.  Please do not use if you have an allergy to CHG or antibacterial soaps. If your skin becomes reddened/irritated stop using the CHG.  Do not shave (including legs and underarms) for at least 48 hours prior to first CHG shower. It is OK to shave your face.  Please follow these instructions carefully.   1. Shower the NIGHT BEFORE SURGERY and the MORNING OF SURGERY with CHG.   2. If you chose to wash your hair, wash your hair first as usual with your normal shampoo.  3. After you shampoo, rinse your hair and body thoroughly to remove the shampoo.  4. Use CHG as you would any other liquid soap. You can apply CHG directly to the skin and wash gently with a scrungie or a clean washcloth.   5. Apply the CHG Soap to your body ONLY FROM THE NECK DOWN.  Do not use on open wounds or  open sores. Avoid contact with your eyes, ears, mouth and genitals (private parts). Wash Face and genitals (private parts)  with your normal soap.  6. Wash thoroughly, paying special attention to the area where your surgery will be performed.  7. Thoroughly rinse your body with warm water from the neck down.  8. DO NOT shower/wash with your normal soap after using and rinsing off the CHG Soap.  9. Pat yourself dry with a CLEAN TOWEL.  10. Wear CLEAN PAJAMAS to bed the night before surgery, wear comfortable clothes the morning of surgery  11. Place CLEAN SHEETS on your bed the night of your first shower and DO NOT SLEEP WITH PETS.  Day of Surgery: Do not apply any deodorants/lotions,  Powders or colognes. Please wear clean clothes to the hospital/surgery. Do not wear jewelry, make-up or nail polish.  Do not shave 48 hours prior to surgery.  Men may shave face and neck.  Do not bring valuables to the hospital.  [facility] is not responsible for any belongings or valuables.  Contacts, dentures or bridgework may not be worn into surgery.  Leave your suitcase in the car.  After surgery it may be brought to your room.  For patients admitted to the hospital, discharge time will be determined by your treatment team.  Patients discharged the day of surgery will  not be allowed to drive home.   Please read over the following fact sheets that you were given: Pain Booklet, Patient Instructions for Mupirocin Application, Incentive Spirometry, Surgical Site Infections.

## 2017-11-14 NOTE — Progress Notes (Signed)
Anesthesia Chart Review:   Case:  [id number] Date/Time:  11/22/17 0715   Procedure:  ANTERIOR CERVICAL DECOMPRESSION FUSION CERVICAL 4-5, CERVICAL 5-6, CERVICAL 6-7 WITH INSTRUMENTATION AND ALLOGRAFT TIME      /   REQUESTED 4 HOURS (Bilateral )   Anesthesia type:  General   Pre-op diagnosis:  RIGHT GREATER THAN LEFT ARM PAIN AND NUMBNESS   Location:  [facility]   Surgeon:  Phylliss Bob, MD      DISCUSSION: Pt is a 67 year old male.   VS: BP 119/81 Comment: right  Pulse 80   Temp 36.8 C (Oral)   Resp 18   Ht 5\' 10"  (1.778 m)   Wt 142 lb 3.2 oz (64.5 kg)   SpO2 100%   BMI 20.40 kg/m    PROVIDERS: PCP is Plotnikov, Evie Lacks, MD who is aware of upcoming surgery.   Patient Care Team: Irene Shipper, MD as Consulting Physician (Gastroenterology) ID is Debby Freiberg, MD (notes in care everywhere)    LABS: Labs reviewed: Acceptable for surgery. (all labs ordered are listed, but only abnormal results are displayed)  Labs Reviewed  CBC WITH DIFFERENTIAL/PLATELET - Abnormal; Notable for the following components:      Result Value   Hemoglobin 17.8 (*)    Platelets 147 (*)    All other components within normal limits  COMPREHENSIVE METABOLIC PANEL - Abnormal; Notable for the following components:   Potassium 3.4 (*)    Chloride 94 (*)    CO2 33 (*)    Glucose, Bld 60 (*)    Creatinine, Ser 1.35 (*)    Total Protein 6.2 (*)    GFR calc non Af Amer 53 (*)    All other components within normal limits  URINALYSIS, ROUTINE W REFLEX MICROSCOPIC - Abnormal; Notable for the following components:   APPearance HAZY (*)    All other components within normal limits  SURGICAL PCR SCREEN  APTT  PROTIME-INR  TYPE AND SCREEN  ABO/RH     IMAGES:  CXR 11/14/17: No active cardiopulmonary disease.   EKG 11/13/17: Sinus rhythm with PACs. RBBB. LAFB. PACs new when compared to EKG 05/03/15   Past Medical History:  Diagnosis Date  . Arthritis   . Cancer ([place])    Skin cancer-  basal and squamous  . Constipation due to pain medication   . HIV disease ([place]) 1985   last CD4 10/02/12 was 277  . Hypertension   . Hypogonadism male     Past Surgical History:  Procedure Laterality Date  . COLONOSCOPY W/ POLYPECTOMY    . EYE SURGERY Bilateral    Lasik, Cataract  . [place]  . KNEE ARTHROSCOPY Right 2009  . KNEE ARTHROSCOPY Left 2011  . neck surgery  1984    MEDICATIONS: . Alprostadil, Vasodilator, (CAVERJECT) 20 MCG SOLR  . ANUCORT-HC 25 MG suppository  . aspirin 81 MG tablet  . atorvastatin (LIPITOR) 40 MG tablet  . baclofen (LIORESAL) 10 MG tablet  . celecoxib (CELEBREX) 200 MG capsule  . cetirizine-pseudoephedrine (ZYRTEC-D ALLERGY & CONGESTION) 5-120 MG tablet  . Cholecalciferol (VITAMIN D3) 5000 units TABS  . docusate sodium (COLACE) 100 MG capsule  . emtricitabine-rilpivir-tenofovir AF (ODEFSEY) 200-25-25 MG TABS tablet  . gabapentin (NEURONTIN) 100 MG capsule  . hydrochlorothiazide (HYDRODIURIL) 50 MG tablet  . HYDROcodone-acetaminophen (NORCO) 7.5-325 MG per tablet  . ibuprofen (ADVIL,MOTRIN) 800 MG tablet  . Melatonin 10 MG TABS  . mirabegron ER (  MYRBETRIQ) 25 MG TB24 tablet  . montelukast (SINGULAIR) 10 MG tablet  . Multiple Vitamin (MULTIVITAMIN WITH MINERALS) TABS tablet  . polyethylene glycol powder (GLYCOLAX/MIRALAX) powder  . potassium chloride (K-DUR) 10 MEQ tablet  . RESTASIS 0.05 % ophthalmic emulsion  . tadalafil (CIALIS) 20 MG tablet  . testosterone enanthate (DELATESTRYL) 200 MG/ML injection   No current facility-administered medications for this encounter.     If no changes, I anticipate pt can proceed with surgery as scheduled.   Willeen Cass, FNP-BC [facility] Stay [facility]/Anesthesiology Phone: [phone] 11/14/2017 4:07 PM

## 2017-11-21 NOTE — Anesthesia Preprocedure Evaluation (Signed)
Anesthesia Evaluation  Patient identified by MRN, date of birth, ID band Patient awake    Reviewed: Allergy & Precautions, H&P , NPO status , Patient's Chart, lab work & pertinent test results, reviewed documented beta blocker date and time   Airway Mallampati: II  TM Distance: >3 FB Neck ROM: full    Dental no notable dental hx.    Pulmonary neg pulmonary ROS, former smoker,    Pulmonary exam normal breath sounds clear to auscultation       Cardiovascular Exercise Tolerance: Good hypertension, Pt. on medications negative cardio ROS   Rhythm:regular Rate:Normal  EKG 3/19 Sinus rhythm with Premature atrial complexes Right bundle branch block Left anterior fascicular block   Neuro/Psych negative neurological ROS  negative psych ROS   GI/Hepatic negative GI ROS, Neg liver ROS,   Endo/Other  negative endocrine ROS  Renal/GU negative Renal ROS  negative genitourinary   Musculoskeletal  (+) Arthritis , Osteoarthritis,    Abdominal   Peds negative pediatric ROS (+)  Hematology  (+) HIV,   Anesthesia Other Findings   Reproductive/Obstetrics negative OB ROS                             Anesthesia Physical Anesthesia Plan  ASA: III  Anesthesia Plan: General   Post-op Pain Management:    Induction: Intravenous  PONV Risk Score and Plan: 2 and Ondansetron and Treatment may vary due to age or medical condition  Airway Management Planned: Oral ETT  Additional Equipment:   Intra-op Plan:   Post-operative Plan: Extubation in OR  Informed Consent: I have reviewed the patients History and Physical, chart, labs and discussed the procedure including the risks, benefits and alternatives for the proposed anesthesia with the patient or authorized representative who has indicated his/her understanding and acceptance.   Dental Advisory Given  Plan Discussed with: CRNA, Anesthesiologist and  Surgeon  Anesthesia Plan Comments: (  )        Anesthesia Quick Evaluation

## 2017-11-22 ENCOUNTER — Encounter (HOSPITAL_COMMUNITY): Payer: Self-pay | Admitting: *Deleted

## 2017-11-22 ENCOUNTER — Inpatient Hospital Stay (HOSPITAL_COMMUNITY): Payer: Medicare Other | Admitting: Emergency Medicine

## 2017-11-22 ENCOUNTER — Other Ambulatory Visit: Payer: Self-pay

## 2017-11-22 ENCOUNTER — Ambulatory Visit (HOSPITAL_COMMUNITY)
Admission: RE | Disposition: A | Discharge status: Home / Self Care | Payer: Self-pay | Source: Ambulatory Visit | Attending: Orthopedic Surgery

## 2017-11-22 ENCOUNTER — Observation Stay (HOSPITAL_COMMUNITY)
Admission: RE | Admit: 2017-11-22 | Discharge: 2017-11-23 | Disposition: A | Discharge status: Home / Self Care | Payer: Medicare Other | Source: Ambulatory Visit | Attending: Orthopedic Surgery | Admitting: Orthopedic Surgery

## 2017-11-22 ENCOUNTER — Inpatient Hospital Stay (HOSPITAL_COMMUNITY): Payer: Medicare Other

## 2017-11-22 ENCOUNTER — Inpatient Hospital Stay (HOSPITAL_COMMUNITY): Payer: Medicare Other | Admitting: Anesthesiology

## 2017-11-22 DIAGNOSIS — Z79899 Other long term (current) drug therapy: Secondary | ICD-10-CM | POA: Diagnosis not present

## 2017-11-22 DIAGNOSIS — Z85828 Personal history of other malignant neoplasm of skin: Secondary | ICD-10-CM | POA: Diagnosis not present

## 2017-11-22 DIAGNOSIS — Z7982 Long term (current) use of aspirin: Secondary | ICD-10-CM | POA: Diagnosis not present

## 2017-11-22 DIAGNOSIS — B20 Human immunodeficiency virus [HIV] disease: Secondary | ICD-10-CM | POA: Insufficient documentation

## 2017-11-22 DIAGNOSIS — Z791 Long term (current) use of non-steroidal anti-inflammatories (NSAID): Secondary | ICD-10-CM | POA: Insufficient documentation

## 2017-11-22 DIAGNOSIS — M4802 Spinal stenosis, cervical region: Secondary | ICD-10-CM | POA: Insufficient documentation

## 2017-11-22 DIAGNOSIS — Z87891 Personal history of nicotine dependence: Secondary | ICD-10-CM | POA: Insufficient documentation

## 2017-11-22 DIAGNOSIS — M47817 Spondylosis without myelopathy or radiculopathy, lumbosacral region: Secondary | ICD-10-CM | POA: Diagnosis not present

## 2017-11-22 DIAGNOSIS — M47816 Spondylosis without myelopathy or radiculopathy, lumbar region: Secondary | ICD-10-CM | POA: Diagnosis not present

## 2017-11-22 DIAGNOSIS — M5412 Radiculopathy, cervical region: Principal | ICD-10-CM | POA: Insufficient documentation

## 2017-11-22 DIAGNOSIS — M5410 Radiculopathy, site unspecified: Secondary | ICD-10-CM | POA: Diagnosis present

## 2017-11-22 DIAGNOSIS — Z419 Encounter for procedure for purposes other than remedying health state, unspecified: Secondary | ICD-10-CM

## 2017-11-22 DIAGNOSIS — I10 Essential (primary) hypertension: Secondary | ICD-10-CM | POA: Diagnosis not present

## 2017-11-22 HISTORY — PX: ANTERIOR CERVICAL DECOMP/DISCECTOMY FUSION: SHX1161

## 2017-11-22 SURGERY — ANTERIOR CERVICAL DECOMPRESSION/DISCECTOMY FUSION 2 LEVELS
Anesthesia: General | Site: Spine Cervical | Laterality: Bilateral

## 2017-11-22 MED ORDER — PROPOFOL 10 MG/ML IV BOLUS
INTRAVENOUS | Status: AC
Start: 2017-11-22 — End: ?
  Filled 2017-11-22: qty 20

## 2017-11-22 MED ORDER — BUPIVACAINE-EPINEPHRINE 0.25% -1:200000 IJ SOLN
INTRAMUSCULAR | Status: DC | PRN
  Administered 2017-11-22: 30 mL

## 2017-11-22 MED ORDER — LACTATED RINGERS IV SOLN
INTRAVENOUS | Status: DC | PRN
  Administered 2017-11-22 (×2): via INTRAVENOUS

## 2017-11-22 MED ORDER — ONDANSETRON HCL 4 MG/2ML IJ SOLN: 4.0000 mg | Freq: Four times a day (QID) | INTRAMUSCULAR | Status: DC | PRN

## 2017-11-22 MED ORDER — POTASSIUM CHLORIDE ER 10 MEQ PO TBCR
10.0000 meq | EXTENDED_RELEASE_TABLET | Freq: Every day | ORAL | Status: DC
  Administered 2017-11-23: 10 meq via ORAL
  Filled 2017-11-22 (×2): qty 1

## 2017-11-22 MED ORDER — FENTANYL CITRATE (PF) 100 MCG/2ML IJ SOLN: 25.0000 ug | INTRAMUSCULAR | Status: DC | PRN

## 2017-11-22 MED ORDER — SUGAMMADEX SODIUM 200 MG/2ML IV SOLN
INTRAVENOUS | Status: AC
  Filled 2017-11-22: qty 2

## 2017-11-22 MED ORDER — ONDANSETRON HCL 4 MG/2ML IJ SOLN
INTRAMUSCULAR | Status: DC | PRN
  Administered 2017-11-22: 4 mg via INTRAVENOUS

## 2017-11-22 MED ORDER — GABAPENTIN 100 MG PO CAPS
100.0000 mg | ORAL_CAPSULE | Freq: Three times a day (TID) | ORAL | Status: DC
  Administered 2017-11-22 – 2017-11-23 (×3): 100 mg via ORAL
  Filled 2017-11-22 (×3): qty 1

## 2017-11-22 MED ORDER — ACETAMINOPHEN 325 MG PO TABS: 650.0000 mg | ORAL_TABLET | ORAL | Status: DC | PRN

## 2017-11-22 MED ORDER — 0.9 % SODIUM CHLORIDE (POUR BTL) OPTIME
TOPICAL | Status: DC | PRN
  Administered 2017-11-22: 1000 mL

## 2017-11-22 MED ORDER — LORATADINE 10 MG PO TABS
10.0000 mg | ORAL_TABLET | Freq: Every day | ORAL | Status: DC
  Administered 2017-11-23: 10 mg via ORAL
  Filled 2017-11-22: qty 1

## 2017-11-22 MED ORDER — ONDANSETRON HCL 4 MG/2ML IJ SOLN
INTRAMUSCULAR | Status: AC
  Filled 2017-11-22: qty 2

## 2017-11-22 MED ORDER — MIDAZOLAM HCL 2 MG/2ML IJ SOLN
INTRAMUSCULAR | Status: AC
  Filled 2017-11-22: qty 2

## 2017-11-22 MED ORDER — VITAMIN D 1000 UNITS PO TABS
5000.0000 [IU] | ORAL_TABLET | Freq: Every day | ORAL | Status: DC
  Administered 2017-11-23: 5000 [IU] via ORAL
  Filled 2017-11-22: qty 1
  Filled 2017-11-22: qty 5

## 2017-11-22 MED ORDER — EPHEDRINE SULFATE 50 MG/ML IJ SOLN
INTRAMUSCULAR | Status: AC
  Filled 2017-11-22: qty 1

## 2017-11-22 MED ORDER — PHENYLEPHRINE HCL 10 MG/ML IJ SOLN
INTRAVENOUS | Status: DC | PRN
  Administered 2017-11-22: 25 ug/min via INTRAVENOUS

## 2017-11-22 MED ORDER — CYCLOSPORINE 0.05 % OP EMUL
1.0000 [drp] | Freq: Two times a day (BID) | OPHTHALMIC | Status: DC
  Administered 2017-11-22 – 2017-11-23 (×2): 1 [drp] via OPHTHALMIC
  Filled 2017-11-22 (×2): qty 1

## 2017-11-22 MED ORDER — ACETAMINOPHEN 650 MG RE SUPP: 650.0000 mg | RECTAL | Status: DC | PRN

## 2017-11-22 MED ORDER — BUPIVACAINE-EPINEPHRINE (PF) 0.25% -1:200000 IJ SOLN
INTRAMUSCULAR | Status: AC
  Filled 2017-11-22: qty 30

## 2017-11-22 MED ORDER — SODIUM CHLORIDE 0.9% FLUSH: 3.0000 mL | INTRAVENOUS | Status: DC | PRN

## 2017-11-22 MED ORDER — POVIDONE-IODINE 7.5 % EX SOLN
Freq: Once | CUTANEOUS | Status: DC
  Filled 2017-11-22: qty 118

## 2017-11-22 MED ORDER — CEFAZOLIN SODIUM-DEXTROSE 2-4 GM/100ML-% IV SOLN
2.0000 g | INTRAVENOUS | Status: AC
  Administered 2017-11-22: 2 g via INTRAVENOUS
  Filled 2017-11-22: qty 100

## 2017-11-22 MED ORDER — FLEET ENEMA 7-19 GM/118ML RE ENEM: 1.0000 | ENEMA | Freq: Once | RECTAL | Status: DC | PRN

## 2017-11-22 MED ORDER — FENTANYL CITRATE (PF) 250 MCG/5ML IJ SOLN
INTRAMUSCULAR | Status: AC
  Filled 2017-11-22: qty 5

## 2017-11-22 MED ORDER — MEPERIDINE HCL 50 MG/ML IJ SOLN: 6.2500 mg | INTRAMUSCULAR | Status: DC | PRN

## 2017-11-22 MED ORDER — SODIUM CHLORIDE 0.9 % IV SOLN: 250.0000 mL | INTRAVENOUS | Status: DC

## 2017-11-22 MED ORDER — DIPHENHYDRAMINE HCL 50 MG/ML IJ SOLN
INTRAMUSCULAR | Status: DC | PRN
  Administered 2017-11-22: 12.5 mg via INTRAVENOUS

## 2017-11-22 MED ORDER — ATORVASTATIN CALCIUM 20 MG PO TABS
40.0000 mg | ORAL_TABLET | Freq: Every day | ORAL | Status: DC
  Administered 2017-11-22 – 2017-11-23 (×2): 40 mg via ORAL
  Filled 2017-11-22 (×2): qty 2

## 2017-11-22 MED ORDER — ONDANSETRON HCL 4 MG PO TABS: 4.0000 mg | ORAL_TABLET | Freq: Four times a day (QID) | ORAL | Status: DC | PRN

## 2017-11-22 MED ORDER — MONTELUKAST SODIUM 10 MG PO TABS
10.0000 mg | ORAL_TABLET | Freq: Every day | ORAL | Status: DC
  Administered 2017-11-22 – 2017-11-23 (×2): 10 mg via ORAL
  Filled 2017-11-22 (×2): qty 1

## 2017-11-22 MED ORDER — MIDAZOLAM HCL 5 MG/5ML IJ SOLN
INTRAMUSCULAR | Status: DC | PRN
  Administered 2017-11-22: 2 mg via INTRAVENOUS

## 2017-11-22 MED ORDER — ALUM & MAG HYDROXIDE-SIMETH 200-200-20 MG/5ML PO SUSP
30.0000 mL | Freq: Four times a day (QID) | ORAL | Status: DC | PRN
Start: 2017-11-22 — End: 2017-11-23

## 2017-11-22 MED ORDER — BISACODYL 5 MG PO TBEC: 5.0000 mg | DELAYED_RELEASE_TABLET | Freq: Every day | ORAL | Status: DC | PRN

## 2017-11-22 MED ORDER — FENTANYL CITRATE (PF) 100 MCG/2ML IJ SOLN
INTRAMUSCULAR | Status: DC | PRN
  Administered 2017-11-22: 50 ug via INTRAVENOUS
  Administered 2017-11-22 (×2): 100 ug via INTRAVENOUS

## 2017-11-22 MED ORDER — HYDROCORTISONE ACETATE 25 MG RE SUPP
25.0000 mg | Freq: Two times a day (BID) | RECTAL | Status: DC | PRN
  Filled 2017-11-22: qty 1

## 2017-11-22 MED ORDER — OXYCODONE-ACETAMINOPHEN 5-325 MG PO TABS
1.0000 | ORAL_TABLET | ORAL | Status: DC | PRN
  Administered 2017-11-22: 1 via ORAL
  Administered 2017-11-22 – 2017-11-23 (×3): 2 via ORAL
  Filled 2017-11-22: qty 1
  Filled 2017-11-22: qty 2
  Filled 2017-11-22: qty 1
  Filled 2017-11-22: qty 2
  Filled 2017-11-22: qty 1

## 2017-11-22 MED ORDER — POLYETHYLENE GLYCOL 3350 17 G PO PACK: 17.0000 g | PACK | Freq: Every day | ORAL | Status: DC | PRN

## 2017-11-22 MED ORDER — THROMBIN 20000 UNITS EX SOLR
CUTANEOUS | Status: AC
  Filled 2017-11-22: qty 20000

## 2017-11-22 MED ORDER — LIDOCAINE HCL (CARDIAC) PF 100 MG/5ML IV SOSY
PREFILLED_SYRINGE | INTRAVENOUS | Status: DC | PRN
  Administered 2017-11-22: 100 mg via INTRAVENOUS

## 2017-11-22 MED ORDER — SODIUM CHLORIDE 0.9% FLUSH
3.0000 mL | Freq: Two times a day (BID) | INTRAVENOUS | Status: DC
  Administered 2017-11-22: 3 mL via INTRAVENOUS

## 2017-11-22 MED ORDER — MENTHOL 3 MG MT LOZG: 1.0000 | LOZENGE | OROMUCOSAL | Status: DC | PRN

## 2017-11-22 MED ORDER — PHENYLEPHRINE 40 MCG/ML (10ML) SYRINGE FOR IV PUSH (FOR BLOOD PRESSURE SUPPORT)
PREFILLED_SYRINGE | INTRAVENOUS | Status: AC
  Filled 2017-11-22: qty 10

## 2017-11-22 MED ORDER — EPHEDRINE SULFATE 50 MG/ML IJ SOLN
INTRAMUSCULAR | Status: DC | PRN
  Administered 2017-11-22 (×2): 10 mg via INTRAVENOUS

## 2017-11-22 MED ORDER — ROCURONIUM BROMIDE 50 MG/5ML IV SOLN
INTRAVENOUS | Status: AC
  Filled 2017-11-22: qty 1

## 2017-11-22 MED ORDER — PHENYLEPHRINE HCL 10 MG/ML IJ SOLN
INTRAMUSCULAR | Status: DC | PRN
  Administered 2017-11-22: 120 ug via INTRAVENOUS
  Administered 2017-11-22: 80 ug via INTRAVENOUS

## 2017-11-22 MED ORDER — EMTRICITAB-RILPIVIR-TENOFOV AF 200-25-25 MG PO TABS
1.0000 | ORAL_TABLET | Freq: Every day | ORAL | Status: DC
  Administered 2017-11-23: 1 via ORAL
  Filled 2017-11-22 (×2): qty 1

## 2017-11-22 MED ORDER — DIAZEPAM 5 MG PO TABS
5.0000 mg | ORAL_TABLET | Freq: Four times a day (QID) | ORAL | Status: DC | PRN
  Administered 2017-11-22 – 2017-11-23 (×2): 5 mg via ORAL
  Filled 2017-11-22 (×2): qty 1

## 2017-11-22 MED ORDER — POLYETHYLENE GLYCOL 3350 17 GM/SCOOP PO POWD
17.0000 g | Freq: Every day | ORAL | Status: DC | PRN
  Filled 2017-11-22: qty 255

## 2017-11-22 MED ORDER — TESTOSTERONE ENANTHATE 200 MG/ML IM SOLN: 200.0000 mg | INTRAMUSCULAR | Status: DC

## 2017-11-22 MED ORDER — ADULT MULTIVITAMIN W/MINERALS CH
1.0000 | ORAL_TABLET | Freq: Every day | ORAL | Status: DC
  Administered 2017-11-23: 1 via ORAL
  Filled 2017-11-22: qty 1

## 2017-11-22 MED ORDER — THROMBIN (RECOMBINANT) 20000 UNITS EX SOLR
CUTANEOUS | Status: AC
  Filled 2017-11-22: qty 20000

## 2017-11-22 MED ORDER — ROCURONIUM BROMIDE 100 MG/10ML IV SOLN
INTRAVENOUS | Status: DC | PRN
  Administered 2017-11-22: 50 mg via INTRAVENOUS
  Administered 2017-11-22: 10 mg via INTRAVENOUS

## 2017-11-22 MED ORDER — SENNOSIDES-DOCUSATE SODIUM 8.6-50 MG PO TABS: 1.0000 | ORAL_TABLET | Freq: Every evening | ORAL | Status: DC | PRN

## 2017-11-22 MED ORDER — DOCUSATE SODIUM 100 MG PO CAPS
100.0000 mg | ORAL_CAPSULE | Freq: Every evening | ORAL | Status: DC | PRN
  Administered 2017-11-22: 100 mg via ORAL

## 2017-11-22 MED ORDER — PHENOL 1.4 % MT LIQD: 1.0000 | OROMUCOSAL | Status: DC | PRN

## 2017-11-22 MED ORDER — CEFAZOLIN SODIUM-DEXTROSE 2-4 GM/100ML-% IV SOLN
2.0000 g | Freq: Three times a day (TID) | INTRAVENOUS | Status: AC
  Administered 2017-11-22 (×2): 2 g via INTRAVENOUS
  Filled 2017-11-22 (×2): qty 100

## 2017-11-22 MED ORDER — MIRABEGRON ER 25 MG PO TB24
25.0000 mg | ORAL_TABLET | Freq: Every day | ORAL | Status: DC
  Administered 2017-11-22: 25 mg via ORAL
  Filled 2017-11-22: qty 1

## 2017-11-22 MED ORDER — DOCUSATE SODIUM 100 MG PO CAPS
100.0000 mg | ORAL_CAPSULE | Freq: Two times a day (BID) | ORAL | Status: DC
  Administered 2017-11-22 – 2017-11-23 (×2): 100 mg via ORAL
  Filled 2017-11-22 (×2): qty 1

## 2017-11-22 MED ORDER — HYDROCHLOROTHIAZIDE 25 MG PO TABS
50.0000 mg | ORAL_TABLET | Freq: Every day | ORAL | Status: DC
  Administered 2017-11-22 – 2017-11-23 (×2): 50 mg via ORAL
  Filled 2017-11-22 (×2): qty 2

## 2017-11-22 MED ORDER — PROPOFOL 10 MG/ML IV BOLUS
INTRAVENOUS | Status: DC | PRN
  Administered 2017-11-22: 100 mg via INTRAVENOUS

## 2017-11-22 MED ORDER — SUGAMMADEX SODIUM 200 MG/2ML IV SOLN
INTRAVENOUS | Status: DC | PRN
  Administered 2017-11-22: 200 mg via INTRAVENOUS

## 2017-11-22 MED ORDER — MELATONIN 10 MG PO TABS: 10.0000 mg | ORAL_TABLET | Freq: Every evening | ORAL | Status: DC | PRN

## 2017-11-22 MED ORDER — LIDOCAINE 2% (20 MG/ML) 5 ML SYRINGE
INTRAMUSCULAR | Status: AC
  Filled 2017-11-22: qty 5

## 2017-11-22 MED ORDER — PANTOPRAZOLE SODIUM 40 MG PO TBEC
40.0000 mg | DELAYED_RELEASE_TABLET | Freq: Every day | ORAL | Status: DC
  Administered 2017-11-22 – 2017-11-23 (×2): 40 mg via ORAL
  Filled 2017-11-22 (×2): qty 1

## 2017-11-22 MED ORDER — THROMBIN (RECOMBINANT) 20000 UNITS EX SOLR
CUTANEOUS | Status: DC | PRN
  Administered 2017-11-22: 20000 [IU] via TOPICAL

## 2017-11-22 SURGICAL SUPPLY — 74 items
BENZOIN TINCTURE PRP APPL 2/3 (GAUZE/BANDAGES/DRESSINGS) ×2 IMPLANT
BIT DRILL NEURO 2X3.1 SFT TUCH (MISCELLANEOUS) ×1 IMPLANT
BIT DRILL SRG 14X2.2XFLT CHK (BIT) ×1 IMPLANT
BIT DRL SRG 14X2.2XFLT CHK (BIT) ×1
BLADE CLIPPER SURG (BLADE) ×2 IMPLANT
BLADE SURG 15 STRL LF DISP TIS (BLADE) ×1 IMPLANT
BLADE SURG 15 STRL SS (BLADE) ×1
BONE VIVIGEN FORMABLE 1.3CC (Bone Implant) ×4 IMPLANT
BUR MATCHSTICK NEURO 3.0 LAGG (BURR) IMPLANT
CARTRIDGE OIL MAESTRO DRILL (MISCELLANEOUS) ×1 IMPLANT
COLLAR CERV LO CONTOUR FIRM DE (SOFTGOODS) IMPLANT
CORDS BIPOLAR (ELECTRODE) ×2 IMPLANT
COVER SURGICAL LIGHT HANDLE (MISCELLANEOUS) ×2 IMPLANT
CRADLE DONUT ADULT HEAD (MISCELLANEOUS) ×2 IMPLANT
DECANTER SPIKE VIAL GLASS SM (MISCELLANEOUS) ×2 IMPLANT
DIFFUSER DRILL AIR PNEUMATIC (MISCELLANEOUS) ×2 IMPLANT
DRAIN JACKSON RD 7FR 3/32 (WOUND CARE) IMPLANT
DRAPE C-ARM 42X72 X-RAY (DRAPES) ×2 IMPLANT
DRAPE POUCH INSTRU U-SHP 10X18 (DRAPES) ×2 IMPLANT
DRAPE SURG 17X23 STRL (DRAPES) ×8 IMPLANT
DRILL BIT SKYLINE 14MM (BIT) ×1
DRILL NEURO 2X3.1 SOFT TOUCH (MISCELLANEOUS) ×2
DURAPREP 26ML APPLICATOR (WOUND CARE) ×2 IMPLANT
ELECT COATED BLADE 2.86 ST (ELECTRODE) ×2 IMPLANT
ELECT REM PT RETURN 9FT ADLT (ELECTROSURGICAL) ×2
ELECTRODE REM PT RTRN 9FT ADLT (ELECTROSURGICAL) ×1 IMPLANT
EVACUATOR SILICONE 100CC (DRAIN) IMPLANT
GAUZE SPONGE 4X4 12PLY STRL (GAUZE/BANDAGES/DRESSINGS) ×2 IMPLANT
GAUZE SPONGE 4X4 16PLY XRAY LF (GAUZE/BANDAGES/DRESSINGS) ×2 IMPLANT
GLOVE BIO SURGEON STRL SZ7 (GLOVE) ×2 IMPLANT
GLOVE BIO SURGEON STRL SZ8 (GLOVE) ×2 IMPLANT
GLOVE BIOGEL PI IND STRL 7.0 (GLOVE) ×2 IMPLANT
GLOVE BIOGEL PI IND STRL 8 (GLOVE) ×1 IMPLANT
GLOVE BIOGEL PI INDICATOR 7.0 (GLOVE) ×2
GLOVE BIOGEL PI INDICATOR 8 (GLOVE) ×1
GOWN STRL REUS W/ TWL LRG LVL3 (GOWN DISPOSABLE) ×1 IMPLANT
GOWN STRL REUS W/ TWL XL LVL3 (GOWN DISPOSABLE) ×1 IMPLANT
GOWN STRL REUS W/TWL LRG LVL3 (GOWN DISPOSABLE) ×1
GOWN STRL REUS W/TWL XL LVL3 (GOWN DISPOSABLE) ×1
INTERLOCK LRDTC CRVCL VBR 6MM (Bone Implant) ×1 IMPLANT
INTERLOCK LRDTC CRVCL VBR SM (Bone Implant) ×2 IMPLANT
IV CATH 14GX2 1/4 (CATHETERS) ×2 IMPLANT
KIT BASIN OR (CUSTOM PROCEDURE TRAY) ×2 IMPLANT
KIT TURNOVER KIT B (KITS) ×2 IMPLANT
LORDOTIC CERVICAL VBR 6MM SM (Bone Implant) ×2 IMPLANT
LORDOTIC CERVICAL VBR SM 5MM (Bone Implant) ×4 IMPLANT
MANIFOLD NEPTUNE II (INSTRUMENTS) ×2 IMPLANT
NEEDLE PRECISIONGLIDE 27X1.5 (NEEDLE) ×2 IMPLANT
NEEDLE SPNL 20GX3.5 QUINCKE YW (NEEDLE) ×2 IMPLANT
NS IRRIG 1000ML POUR BTL (IV SOLUTION) ×2 IMPLANT
OIL CARTRIDGE MAESTRO DRILL (MISCELLANEOUS) ×2
PACK ORTHO CERVICAL (CUSTOM PROCEDURE TRAY) ×2 IMPLANT
PAD ARMBOARD 7.5X6 YLW CONV (MISCELLANEOUS) ×4 IMPLANT
PATTIES SURGICAL .5 X.5 (GAUZE/BANDAGES/DRESSINGS) IMPLANT
PATTIES SURGICAL .5 X1 (DISPOSABLE) ×2 IMPLANT
PIN DISTRACTION 14 (PIN) ×4 IMPLANT
PLATE SKYLINE 3LVL 45MM CERV (Plate) ×2 IMPLANT
SCREW SKYLINE VAR OS 14MM (Screw) ×16 IMPLANT
SPONGE INTESTINAL PEANUT (DISPOSABLE) ×2 IMPLANT
SPONGE SURGIFOAM ABS GEL 100 (HEMOSTASIS) ×2 IMPLANT
STRIP CLOSURE SKIN 1/2X4 (GAUZE/BANDAGES/DRESSINGS) ×2 IMPLANT
SURGIFLO W/THROMBIN 8M KIT (HEMOSTASIS) IMPLANT
SUT MNCRL AB 4-0 PS2 18 (SUTURE) ×2 IMPLANT
SUT SILK 4 0 (SUTURE)
SUT SILK 4-0 18XBRD TIE 12 (SUTURE) IMPLANT
SUT VIC AB 2-0 CT2 18 VCP726D (SUTURE) ×2 IMPLANT
SYR BULB IRRIGATION 50ML (SYRINGE) ×2 IMPLANT
SYR CONTROL 10ML LL (SYRINGE) ×6 IMPLANT
TAPE CLOTH 4X10 WHT NS (GAUZE/BANDAGES/DRESSINGS) ×2 IMPLANT
TAPE UMBILICAL COTTON 1/8X30 (MISCELLANEOUS) ×2 IMPLANT
TOWEL OR 17X24 6PK STRL BLUE (TOWEL DISPOSABLE) ×2 IMPLANT
TOWEL OR 17X26 10 PK STRL BLUE (TOWEL DISPOSABLE) ×2 IMPLANT
WATER STERILE IRR 1000ML POUR (IV SOLUTION) ×2 IMPLANT
YANKAUER SUCT BULB TIP NO VENT (SUCTIONS) ×2 IMPLANT

## 2017-11-22 NOTE — Op Note (Signed)
NAME:  Troy Marsh                MEDICAL RECORD NO.:  [id number]  PHYSICIAN:  Phylliss Bob, MD      DATE OF BIRTH:  June 14, 1951  DATE OF PROCEDURE:  11/22/2017                              OPERATIVE REPORT   PREOPERATIVE DIAGNOSES: 1. Bilateral cervical radiculopathy. 2. Spinal stenosis spanning C4-C7.  POSTOPERATIVE DIAGNOSES: 1. Bilateral cervical radiculopathy. 2. Spinal stenosis spanning C4-C7.  PROCEDURE: 1. Anterior cervical decompression and fusion C4/5, C5/6, C6/7. 2. Placement of anterior instrumentation, C4-C7. 3. Insertion of interbody device x3 (Titan intervertebral spacers). 4. Intraoperative use of fluoroscopy. 5. Use of morselized allograft - ViviGen.  SURGEON:  Phylliss Bob, MD  ASSISTANT:  Pricilla Holm, PA-C.  ANESTHESIA:  General endotracheal anesthesia.  COMPLICATIONS:  None.  DISPOSITION:  Stable.  ESTIMATED BLOOD LOSS:  Minimal.  INDICATIONS FOR SURGERY:  Briefly, Troy Marsh is a pleasant 67 year old male, who did present to me with severe pain in the neck and bilateral arms.  The patient's MRI did reveal the finings noted above.  Given the patient's ongoing rather debilitating pain and lack of improvement with appropriate treatment measures, we did discuss proceeding with the procedure noted above.  The patient was fully aware of the risks and limitations of surgery as outlined in my preoperative note.  OPERATIVE DETAILS:  On 11/22/2017, the patient was brought to surgery and general endotracheal anesthesia was administered.  The patient was placed supine on the hospital bed. The neck was gently extended.  All bony prominences were meticulously padded.  The neck was prepped and draped in the usual sterile fashion.  At this point, I did make a left-sided transverse incision.  The platysma was incised.  A Smith-Robinson approach was used and the anterior spine was identified. A self-retaining retractor was placed.  I then  subperiosteally exposed the vertebral bodies from C4-C7.  Caspar pins were then placed into the C6 and C7 vertebral bodies and distraction was applied.  A thorough and complete C6-7 intervertebral diskectomy was performed.  The posterior longitudinal ligament was identified and entered using a nerve hook.  I then used #1 followed by #2 Kerrison to perform a thorough and complete intervertebral diskectomy.  The spinal canal was thoroughly decompressed, as was the right and left neuroforamen.  The endplates were then prepared and the appropriate-sized intervertebral spacer was then packed with ViviGen and tamped into position in the usual fashion.  The lower Caspar pin was then removed and placed into the C5 vertebral body and once again, distraction was applied across the C5-6 intervertebral space.  I then again performed a thorough and complete diskectomy, thoroughly decompressing the spinal canal and bilateral neuroforamena.  After preparing the endplates, the appropriate-sized intervertebral spacer was packed with ViviGen and tamped into position.  The lower Caspar pin was then removed and placed into the C4 vertebral body and once again, distraction was applied across the C4-5 intervertebral space.  I then again performed a thorough and complete diskectomy, thoroughly decompressing the spinal canal and bilateral neuroforamena.  After preparing the endplates, the appropriate-sized intervertebral spacer was packed with ViviGen and tamped into position.  The Caspar pins then were removed and bone wax was placed in their place.  The appropriate-sized anterior cervical plate was placed over the anterior spine.  14 mm variable angle screws  were placed, 2 in each vertebral body from C4-C7 for a total of 8 vertebral body screws.  The screws were then locked to the plate using the Cam locking mechanism.  I was very pleased with the final fluoroscopic images.  The wound was then irrigated.  The  wound was then explored for any undue bleeding and there was no bleeding noted. The wound was then closed in layers using 2-0 Vicryl, followed by 4-0 Monocryl.  Benzoin and Steri-Strips were applied, followed by sterile dressing.  All instrument counts were correct at the termination of the procedure.  Of note, Pricilla Holm, PA-C, was my assistant throughout surgery, and did aid in retraction, suctioning, and closure from start to finish.     Phylliss Bob, MD

## 2017-11-22 NOTE — Plan of Care (Signed)
  Problem: Education: Goal: Knowledge of General Education information will improve Outcome: Progressing   Problem: Health Behavior/Discharge Planning: Goal: Ability to manage health-related needs will improve Outcome: Progressing   Problem: Clinical Measurements: Goal: Ability to maintain clinical measurements within normal limits will improve Outcome: Progressing Goal: Will remain free from infection Outcome: Progressing Goal: Diagnostic test results will improve Outcome: Progressing Goal: Respiratory complications will improve Outcome: Progressing Goal: Cardiovascular complication will be avoided Outcome: Progressing   Problem: Activity: Goal: Risk for activity intolerance will decrease Outcome: Progressing   Problem: Nutrition: Goal: Adequate nutrition will be maintained Outcome: Progressing   Problem: Coping: Goal: Level of anxiety will decrease Outcome: Progressing   Problem: Elimination: Goal: Will not experience complications related to bowel motility Outcome: Progressing Goal: Will not experience complications related to urinary retention Outcome: Progressing   Problem: Pain Managment: Goal: General experience of comfort will improve Outcome: Progressing   Problem: Safety: Goal: Ability to remain free from injury will improve Outcome: Progressing   Problem: Skin Integrity: Goal: Risk for impaired skin integrity will decrease Outcome: Progressing   Problem: Activity: Goal: Ability to avoid complications of mobility impairment will improve Outcome: Progressing Goal: Ability to tolerate increased activity will improve Outcome: Progressing Goal: Will remain free from falls Outcome: Progressing   Problem: Bowel/Gastric: Goal: Gastrointestinal status for postoperative course will improve Outcome: Progressing   Problem: Education: Goal: Ability to verbalize activity precautions or restrictions will improve Outcome: Progressing Goal: Knowledge of the  prescribed therapeutic regimen will improve Outcome: Progressing Goal: Understanding of discharge needs will improve Outcome: Progressing   Problem: Physical Regulation: Goal: Ability to maintain clinical measurements within normal limits will improve Outcome: Progressing Goal: Postoperative complications will be avoided or minimized Outcome: Progressing Goal: Diagnostic test results will improve Outcome: Progressing   Problem: Pain Management: Goal: Pain level will decrease Outcome: Progressing   Problem: Skin Integrity: Goal: Signs of wound healing will improve Outcome: Progressing   Problem: Health Behavior/Discharge Planning: Goal: Identification of resources available to assist in meeting health care needs will improve Outcome: Progressing   Problem: Bladder/Genitourinary: Goal: Urinary functional status for postoperative course will improve Outcome: Progressing   

## 2017-11-22 NOTE — Anesthesia Postprocedure Evaluation (Signed)
Anesthesia Post Note  Patient: Troy Marsh  Procedure(s) Performed: ANTERIOR CERVICAL DECOMPRESSION FUSION CERVICAL 4-5, CERVICAL 5-6, CERVICAL 6-7 WITH INSTRUMENTATION AND ALLOGRAFT TIME      /   REQUESTED 4 HOURS (Bilateral Spine Cervical)     Anesthesia Type: General    Last Vitals:  Vitals:   11/22/17 1318 11/22/17 1335  BP:  123/80  Pulse: 67 68  Resp: 15 14  Temp: (!) 36.3 C 36.4 C  SpO2: 100% 100%    Last Pain:  Vitals:   11/22/17 1335  TempSrc: Oral  PainSc:                  [name]

## 2017-11-22 NOTE — Transfer of Care (Signed)
Immediate Anesthesia Transfer of Care Note  Patient: Troy Marsh  Procedure(s) Performed: ANTERIOR CERVICAL DECOMPRESSION FUSION CERVICAL 4-5, CERVICAL 5-6, CERVICAL 6-7 WITH INSTRUMENTATION AND ALLOGRAFT TIME      /   REQUESTED 4 HOURS (Bilateral Spine Cervical)  Patient Location: PACU  Anesthesia Type:General  Level of Consciousness: awake and alert   Airway & Oxygen Therapy: Patient Spontanous Breathing and Patient connected to nasal cannula oxygen  Post-op Assessment: Report given to RN, Post -op Vital signs reviewed and stable and Patient moving all extremities X 4  Post vital signs: Reviewed and stable  Last Vitals:  Vitals Value Taken Time  BP 120/79 11/22/2017 11:11 AM  Temp    Pulse 74 11/22/2017 11:17 AM  Resp 16 11/22/2017 11:15 AM  SpO2 100 % 11/22/2017 11:17 AM  Vitals shown include unvalidated device data.  Last Pain:  Vitals:   11/22/17 1112  TempSrc:   PainSc: (P) Asleep         Complications: No apparent anesthesia complications

## 2017-11-22 NOTE — H&P (Signed)
PREOPERATIVE H&P  Chief Complaint: Bilateral arm pain  HPI: Troy Marsh is a 67 y.o. male who presents with ongoing pain in the bilateral arms  MRI reveals spinal stenosis spanning C4-C7  Patient has failed multiple forms of conservative care and continues to have pain (see office notes for additional details regarding the patient's full course of treatment)  Past Medical History:  Diagnosis Date  . Arthritis   . Cancer ([place])    Skin cancer- basal and squamous  . Constipation due to pain medication   . HIV disease ([place]) 1985   last CD4 10/02/12 was 277  . Hypertension   . Hypogonadism male    Past Surgical History:  Procedure Laterality Date  . COLONOSCOPY W/ POLYPECTOMY    . EYE SURGERY Bilateral    Lasik, Cataract  . [place]  . KNEE ARTHROSCOPY Right 2009  . KNEE ARTHROSCOPY Left 2011  . neck surgery  1984   Social History   Socioeconomic History  . Marital status: Married    Spouse name: [name] (partner)  . Number of children: Not on file  . Years of education: 58  . Highest education level: Not on file  Occupational History  . 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 Financial resource strain: Not on file  . Food insecurity:    Worry: Not on file    Inability: Not on file  . Transportation needs:    Medical: Not on file    Non-medical: Not on file  Tobacco Use  . Smoking status: Former Smoker    Packs/day: 2.00    Years: 35.00    Pack years: 70.00    Types: Cigarettes    Last attempt to quit: 02/10/2004    Years since quitting: 13.7  . Smokeless tobacco: Never Used  Substance and Sexual Activity  . Alcohol use: No    Comment: quit 2004  . Drug use: No  . Sexual activity: Yes    Partners: Male    Birth control/protection: Condom    Comment: monogamous w/ single male partner 64  Lifestyle  . Physical activity:    Days per week: Not on file   Minutes per session: Not on file  . Stress: Not on file  Relationships  . Social connections:    Talks on phone: Not on file    Gets together: Not on file    Attends religious service: Not on file    Active member of club or organization: Not on file    Attends meetings of clubs or organizations: Not on file    Relationship status: Not on file  Other Topics Concern  . Not on file  Social History Narrative   [employer]. Work - [place] [employer] - retired 2009. Long term monogamous relationship. Lives in his own home.             Family History  Problem Relation Age of Onset  . Coronary artery disease Father   . Heart attack Father   . Hypertension Father   . Hyperlipidemia Father   . Cancer Father        prostate  . Diabetes Neg Hx   . Colon cancer Neg Hx    No Known Allergies Prior to Admission medications   Medication Sig Start Date End Date Taking? Authorizing Provider  Alprostadil, Vasodilator, (CAVERJECT) 20 MCG SOLR INJECT  1 DOSE (PENILE  INJECTION) EVERY DAY AS  NEEDED AS DIRECTED 10/12/17  Yes Plotnikov, Evie Lacks, MD  ANUCORT-HC 25 MG suppository UNWRAP AND INSERT 1  SUPPOSITORY RECTALLY AT  BEDTIME AS NEEDED FOR  HEMORRHOIDS OR ITCHING Patient taking differently: UNWRAP AND INSERT 1  SUPPOSITORY RECTALLY AT  BEDTIME 05/30/17  Yes Irene Shipper, MD  aspirin 81 MG tablet Take 81 mg by mouth daily.     Yes [provider]  atorvastatin (LIPITOR) 40 MG tablet Take 40 mg by mouth daily.  11/29/15 11/11/18 Yes [provider]  baclofen (LIORESAL) 10 MG tablet Take 1 tablet (10 mg total) by mouth 2 (two) times daily as needed. Patient taking differently: Take 10 mg by mouth 3 (three) times daily as needed for muscle spasms.  05/02/17  Yes Plotnikov, Evie Lacks, MD  celecoxib (CELEBREX) 200 MG capsule Take 1 capsule (200 mg total) by mouth 2 (two) times daily. 10/04/17  Yes Plotnikov, Evie Lacks, MD  cetirizine-pseudoephedrine (ZYRTEC-D  ALLERGY & CONGESTION) 5-120 MG tablet Take 1 tablet by mouth daily.   Yes [provider]  Cholecalciferol (VITAMIN D3) 5000 units TABS Take 5,000 Units by mouth daily.   Yes [provider]  emtricitabine-rilpivir-tenofovir AF (ODEFSEY) 200-25-25 MG TABS tablet Take 1 tablet by mouth daily. 04/10/16  Yes [provider]  gabapentin (NEURONTIN) 100 MG capsule Take 1 capsule (100 mg total) by mouth 3 (three) times daily. Patient taking differently: Take 100 mg by mouth See admin instructions. TAKE 1 CAPSULE (100 MG) BY MOUTH 2-3 TIMES DAILY 06/04/16 11/10/18 Yes Schlossman, Junie Panning, MD  hydrochlorothiazide (HYDRODIURIL) 50 MG tablet TAKE 1 TABLET BY MOUTH  DAILY 10/04/17  Yes Plotnikov, Evie Lacks, MD  HYDROcodone-acetaminophen (NORCO) 7.5-325 MG per tablet Take 0.5-1 tablets by mouth every 6 (six) hours as needed (for pain.).    Yes [provider]  Melatonin 10 MG TABS Take 10 mg by mouth at bedtime as needed (FOR SLEEP.).   Yes [provider]  mirabegron ER (MYRBETRIQ) 25 MG TB24 tablet Take 25 mg by mouth at bedtime.    Yes [provider]  montelukast (SINGULAIR) 10 MG tablet TAKE 1 TABLET BY MOUTH  DAILY 10/04/17  Yes Plotnikov, Evie Lacks, MD  Multiple Vitamin (MULTIVITAMIN WITH MINERALS) TABS tablet Take 1 tablet by mouth daily. COMPLETE MULTIVITAMIN MEN'S 50+   Yes [provider]  polyethylene glycol powder (GLYCOLAX/MIRALAX) powder Take 17 g by mouth daily as needed (FOR CONSTIPATION.).   Yes [provider]  potassium chloride (K-DUR) 10 MEQ tablet TAKE 1 TABLET BY MOUTH  DAILY 10/12/17  Yes Plotnikov, Evie Lacks, MD  RESTASIS 0.05 % ophthalmic emulsion Place 1 drop into both eyes 2 (two) times daily. 10/25/15  Yes [provider]  tadalafil (CIALIS) 20 MG tablet Take 20 mg by mouth daily.   Yes [provider]  testosterone enanthate (DELATESTRYL) 200 MG/ML injection INJECT 1 ML INTRAMUSCULARLY EVERY 14 DAYS.  10/11/17  Yes Plotnikov, Evie Lacks, MD  docusate sodium (COLACE) 100 MG capsule Take 100 mg by mouth at bedtime as needed for mild constipation.    [provider]  ibuprofen (ADVIL,MOTRIN) 800 MG tablet Take 1 tablet (800 mg total) by mouth 3 (three) times daily. 12/28/16   Mackuen, Courteney Lyn, MD     All other systems have been reviewed and were otherwise negative with the exception of those mentioned in the HPI and as above.  Physical Exam: Vitals:   11/22/17 0600  BP: (!) 151/74  Pulse: 71  Resp: 18  Temp: 97.6 F (36.4 C)  SpO2: 100%    There is no height or weight on file to calculate BMI.  General: Alert, no acute distress Cardiovascular: No pedal edema Respiratory: No cyanosis, no use of accessory musculature Skin: No lesions in the area of chief complaint Neurologic: Sensation intact distally Psychiatric: Patient is competent for consent with normal mood and affect Lymphatic: No axillary or cervical lymphadenopathy  Assessment/Plan: RIGHT GREATER THAN LEFT ARM PAIN AND NUMBNESS Plan for Procedure(s): ANTERIOR CERVICAL DECOMPRESSION FUSION CERVICAL 4-5, CERVICAL 5-6, CERVICAL 6-7 WITH INSTRUMENTATION AND ALLOGRAFT   Sinclair Ship, MD 11/22/2017 6:36 AM

## 2017-11-22 NOTE — Addendum Note (Signed)
Addendum  created 11/22/17 1400 by Janeece Riggers, MD   Sign clinical note

## 2017-11-22 NOTE — Anesthesia Procedure Notes (Signed)
Procedure Name: Intubation Date/Time: 11/22/2017 7:47 AM Performed by: Inda Coke, CRNA Pre-anesthesia Checklist: Patient identified, Emergency Drugs available, Suction available and Patient being monitored Patient Re-evaluated:Patient Re-evaluated prior to induction Oxygen Delivery Method: Circle System Utilized Preoxygenation: Pre-oxygenation with 100% oxygen Induction Type: IV induction Ventilation: Mask ventilation without difficulty Laryngoscope Size: Mac and 4 Grade View: Grade I Tube type: Oral Number of attempts: 1 Airway Equipment and Method: Stylet and Oral airway Placement Confirmation: ETT inserted through vocal cords under direct vision,  positive ETCO2 and breath sounds checked- equal and bilateral Secured at: 22 cm Tube secured with: Tape Dental Injury: Teeth and Oropharynx as per pre-operative assessment

## 2017-11-22 NOTE — Anesthesia Postprocedure Evaluation (Signed)
Anesthesia Post Note  Patient: Troy Marsh  Procedure(s) Performed: ANTERIOR CERVICAL DECOMPRESSION FUSION CERVICAL 4-5, CERVICAL 5-6, CERVICAL 6-7 WITH INSTRUMENTATION AND ALLOGRAFT TIME      /   REQUESTED 4 HOURS (Bilateral Spine Cervical)     Patient location during evaluation: PACU Anesthesia Type: General Level of consciousness: awake and alert Pain management: pain level controlled Vital Signs Assessment: post-procedure vital signs reviewed and stable Respiratory status: spontaneous breathing, nonlabored ventilation, respiratory function stable and patient connected to nasal cannula oxygen Cardiovascular status: blood pressure returned to baseline and stable Postop Assessment: no apparent nausea or vomiting Anesthetic complications: no    Last Vitals:  Vitals:   11/22/17 1318 11/22/17 1335  BP:  123/80  Pulse: 67 68  Resp: 15 14  Temp: (!) 36.3 C 36.4 C  SpO2: 100% 100%    Last Pain:  Vitals:   11/22/17 1335  TempSrc: Oral  PainSc:                  Tanner Vigna

## 2017-11-23 ENCOUNTER — Ambulatory Visit: Payer: Medicare Other | Admitting: Internal Medicine

## 2017-11-23 ENCOUNTER — Encounter (HOSPITAL_COMMUNITY): Payer: Self-pay | Admitting: Orthopedic Surgery

## 2017-11-23 DIAGNOSIS — M5412 Radiculopathy, cervical region: Secondary | ICD-10-CM | POA: Diagnosis not present

## 2017-11-23 MED FILL — Thrombin For Soln 20000 Unit: CUTANEOUS | Qty: 1 | Status: AC

## 2017-11-23 NOTE — Progress Notes (Signed)
Patient is discharged from room 3C04 at this time. Alert and in stable condition. IV site d/c'd and instructions read to patient and spouse with understanding verbalized. Left unit via wheelchair with all belongings at side. 

## 2017-11-23 NOTE — Progress Notes (Signed)
    Patient doing well  Has been tolerating PO well Denies arm pain   Physical Exam: Vitals:   11/22/17 2353 11/23/17 0435  BP: 113/77 127/69  Pulse: 75 81  Resp: 18 18  Temp: 98.2 F (36.8 C) 98.4 F (36.9 C)  SpO2: 94% 92%   Neck soft/supple Dressing in place NVI  POD #1 s/p C4-7 ACDF  - encourage ambulation - Percocet for pain, Valium for muscle spasms - likely d/c home today with f/u in 2 weeks

## 2017-11-28 ENCOUNTER — Ambulatory Visit: Payer: Medicare Other | Admitting: Internal Medicine

## 2017-11-29 NOTE — Discharge Summary (Signed)
Patient ID: Troy Marsh MRN: [id number] DOB/AGE: Nov 23, 1950 67 y.o.  Admit date: 11/22/2017 Discharge date: 11/23/2017  Admission Diagnoses:  Active Problems:   Radiculopathy   Discharge Diagnoses:  Same  Past Medical History:  Diagnosis Date  . Arthritis   . Cancer ([place])    Skin cancer- basal and squamous  . Constipation due to pain medication   . HIV disease ([place]) 1985   last CD4 10/02/12 was 277  . Hypertension   . Hypogonadism male     Surgeries: Procedure(s): ANTERIOR CERVICAL DECOMPRESSION FUSION CERVICAL 4-5, CERVICAL 5-6, CERVICAL 6-7 WITH INSTRUMENTATION AND ALLOGRAFT TIME      /   REQUESTED 4 HOURS on 11/22/2017   Consultants: None  Discharged Condition: Improved  Hospital Course: Troy Marsh is an 67 y.o. male who was admitted 11/22/2017 for operative treatment of radiculopathy. Patient has severe unremitting pain that affects sleep, daily activities, and work/hobbies. After pre-op clearance the patient was taken to the operating room on 11/22/2017 and underwent  Procedure(s): ANTERIOR CERVICAL DECOMPRESSION FUSION CERVICAL 4-5, CERVICAL 5-6, CERVICAL 6-7 WITH INSTRUMENTATION AND ALLOGRAFT TIME      /   REQUESTED 4 HOURS.    Patient was given perioperative antibiotics:  Anti-infectives (From admission, onward)   Start     Dose/Rate Route Frequency Ordered Stop   11/22/17 1800  emtricitabine-rilpivir-tenofovir AF (ODEFSEY) 200-25-25 MG per tablet 1 tablet  Status:  Discontinued     1 tablet Oral Daily 11/22/17 1343 11/23/17 1536   11/22/17 1400  ceFAZolin (ANCEF) IVPB 2g/100 mL premix     2 g 200 mL/hr over 30 Minutes Intravenous Every 8 hours 11/22/17 1343 11/22/17 2216   11/22/17 0600  ceFAZolin (ANCEF) IVPB 2g/100 mL premix     2 g 200 mL/hr over 30 Minutes Intravenous On call to O.R. 11/22/17 9030 11/22/17 0755       Patient was given sequential compression devices, early ambulation to prevent DVT.  Patient benefited maximally from hospital  stay and there were no complications.    Recent vital signs: BP 125/78 (BP Location: Right Arm)   Pulse 72   Temp 98.4 F (36.9 C) (Oral)   Resp 16   Ht 5\' 10"  (1.778 m)   Wt 64.4 kg (142 lb)   SpO2 100%   BMI 20.37 kg/m    Discharge Medications:   Allergies as of 11/23/2017   No Known Allergies     Medication List    STOP taking these medications   HYDROcodone-acetaminophen 7.5-325 MG tablet Commonly known as:  [place] these medications   Alprostadil (Vasodilator) 20 MCG Solr Commonly known as:  CAVERJECT INJECT 1 DOSE (PENILE  INJECTION) EVERY DAY AS  NEEDED AS DIRECTED   ANUCORT-HC 25 MG suppository Generic drug:  hydrocortisone UNWRAP AND INSERT 1  SUPPOSITORY RECTALLY AT  BEDTIME AS NEEDED FOR  HEMORRHOIDS OR ITCHING What changed:  See the new instructions.   aspirin 81 MG tablet Take 81 mg by mouth daily.   atorvastatin 40 MG tablet Commonly known as:  LIPITOR Take 40 mg by mouth daily.   baclofen 10 MG tablet Commonly known as:  LIORESAL Take 1 tablet (10 mg total) by mouth 2 (two) times daily as needed. What changed:    when to take this  reasons to take this   docusate sodium 100 MG capsule Commonly known as:  COLACE Take 100 mg by mouth at bedtime as needed for mild constipation.  emtricitabine-rilpivir-tenofovir AF 200-25-25 MG Tabs tablet Commonly known as:  ODEFSEY Take 1 tablet by mouth daily.   gabapentin 100 MG capsule Commonly known as:  NEURONTIN Take 1 capsule (100 mg total) by mouth 3 (three) times daily. What changed:    when to take this  additional instructions   hydrochlorothiazide 50 MG tablet Commonly known as:  HYDRODIURIL TAKE 1 TABLET BY MOUTH  DAILY   Melatonin 10 MG Tabs Take 10 mg by mouth at bedtime as needed (FOR SLEEP.).   montelukast 10 MG tablet Commonly known as:  SINGULAIR TAKE 1 TABLET BY MOUTH  DAILY   multivitamin with minerals Tabs tablet Take 1 tablet by mouth daily. COMPLETE  MULTIVITAMIN MEN'S 50+   MYRBETRIQ 25 MG Tb24 tablet Generic drug:  mirabegron ER Take 25 mg by mouth at bedtime.   polyethylene glycol powder powder Commonly known as:  GLYCOLAX/MIRALAX Take 17 g by mouth daily as needed (FOR CONSTIPATION.).   potassium chloride 10 MEQ tablet Commonly known as:  K-DUR TAKE 1 TABLET BY MOUTH  DAILY   RESTASIS 0.05 % ophthalmic emulsion Generic drug:  cycloSPORINE Place 1 drop into both eyes 2 (two) times daily.   tadalafil 20 MG tablet Commonly known as:  CIALIS Take 20 mg by mouth daily.   testosterone enanthate 200 MG/ML injection Commonly known as:  DELATESTRYL INJECT 1 ML INTRAMUSCULARLY EVERY 14 DAYS.   Vitamin D3 5000 units Tabs Take 5,000 Units by mouth daily.   ZYRTEC-D ALLERGY & CONGESTION 5-120 MG tablet Generic drug:  cetirizine-pseudoephedrine Take 1 tablet by mouth daily.       Diagnostic Studies: Dg Chest 2 View  Result Date: 11/13/2017 CLINICAL DATA:  Prep EXAM: CHEST - 2 VIEW COMPARISON:  02/01/2004 FINDINGS: Stable bibasilar nipple shadows. Hyperaeration. Clear lungs. No pneumothorax or pleural effusion. No pneumothorax or pleural effusion. IMPRESSION: No active cardiopulmonary disease. Electronically Signed   By: Marybelle Killings M.D.   On: 11/13/2017 12:06   Dg Cervical Spine 1 View  Result Date: 11/22/2017 CLINICAL DATA:  Anterior cervical decompensation and C4-C7 fusion. EXAM: DG CERVICAL SPINE - 1 VIEW COMPARISON:  MRI of the cervical spine 10/13/2017 FINDINGS: Single intraoperative fluoroscopic images of the cervical spine demonstrates anterior fusion at C4-C7 with intervening disc spacers. There is normal alignment of the orthopedic hardware. No evidence of fracture. Endotracheal tube and enteric catheter are seen. Curvilinear densities anterior to the sciatic fusion likely represent surgical pads. IMPRESSION: C4-C7 anterior cervical fusion, without evidence of immediate complications. Fluoroscopy time is recorded as 14  seconds. Electronically Signed   By: Fidela Salisbury M.D.   On: 11/22/2017 12:21   Dg C-arm 1-60 Min  Result Date: 11/22/2017 CLINICAL DATA:  67 year old male with a history of anterior cervical discectomy and fusion EXAM: DG C-ARM 61-120 MIN COMPARISON:  None. FINDINGS: Intraoperative cross-table lateral the cervical spine demonstrates surgical changes of anterior cervical discectomy infusion with anterior plate and screw fixation of C4-C7. Interbody spacers appropriately position. Surgical sponge within the anterior soft tissues with endotracheal tube and enteric tube in place. IMPRESSION: Intraoperative cross-table lateral demonstrates anterior cervical discectomy infusion with anterior plate screw fixation of C4-C7. Please refer to the dictated operative report for full details of intraoperative findings and procedure. Electronically Signed   By: Corrie Mckusick D.O.   On: 11/22/2017 12:19   Dg Inject Diag/thera/inc Needle/cath/plc Epi/lumb/sac W/img  Result Date: 11/01/2017 CLINICAL DATA:  Osteoarthritis of the lumbar spine. Lumbosacral spondylosis. Recurrent left lower extremity radiculopathy. The patient has  done well with multiple previous injections. Pain has recurred. FLUOROSCOPY TIME:  Radiation Exposure Index (as provided by the fluoroscopic device): 11.11 uGy*m2 Fluoroscopy Time:  17 seconds Number of Acquired Images:  0 PROCEDURE: The procedure, risks, benefits, and alternatives were explained to the patient. Questions regarding the procedure were encouraged and answered. The patient understands and consents to the procedure. LUMBAR EPIDURAL INJECTION: An interlaminar approach was performed on left at L5-S1. The overlying skin was cleansed and anesthetized. A 20 gauge epidural needle was advanced using loss-of-resistance technique. DIAGNOSTIC EPIDURAL INJECTION: Injection of Isovue-M 200 shows a good epidural pattern with spread above and below the level of needle placement, primarily on the  left no vascular opacification is seen. THERAPEUTIC EPIDURAL INJECTION: 120 mg of Depo-Medrol mixed with 3 mL 1% lidocaine were instilled. The procedure was well-tolerated, and the patient was discharged thirty minutes following the injection in good condition. COMPLICATIONS: None IMPRESSION: Technically successful epidural injection on the left L5-S1 # 2 Electronically Signed   By: San Morelle M.D.   On: 11/01/2017 15:32    Disposition:    POD #1 s/p C4-7 ACDF  - encourage ambulation - Percocet for pain, Valium for muscle spasms -Written scripts for pain signed and in chart -D/C instructions sheet printed and in chart -D/C today  -F/U in office 2 weeks   Signed: Justice Britain 11/29/2017, 7:48 AM

## 2017-12-17 ENCOUNTER — Other Ambulatory Visit: Payer: Self-pay | Admitting: Orthopedic Surgery

## 2017-12-17 DIAGNOSIS — M5136 Other intervertebral disc degeneration, lumbar region: Secondary | ICD-10-CM

## 2018-01-04 ENCOUNTER — Ambulatory Visit
Admission: RE | Admit: 2018-01-04 | Discharge: 2018-01-04 | Disposition: A | Discharge status: Home / Self Care | Payer: Medicare Other | Source: Ambulatory Visit | Attending: Orthopedic Surgery | Admitting: Orthopedic Surgery

## 2018-01-04 DIAGNOSIS — M5136 Other intervertebral disc degeneration, lumbar region: Secondary | ICD-10-CM

## 2018-01-04 MED ORDER — METHYLPREDNISOLONE ACETATE 40 MG/ML INJ SUSP (RADIOLOG
120.0000 mg | Freq: Once | INTRAMUSCULAR | Status: AC
  Administered 2018-01-04: 120 mg via EPIDURAL

## 2018-01-04 MED ORDER — IOPAMIDOL (ISOVUE-M 200) INJECTION 41%
1.0000 mL | Freq: Once | INTRAMUSCULAR | Status: AC
  Administered 2018-01-04: 1 mL via EPIDURAL

## 2018-01-09 ENCOUNTER — Encounter: Payer: Self-pay | Admitting: Internal Medicine

## 2018-01-09 ENCOUNTER — Ambulatory Visit (INDEPENDENT_AMBULATORY_CARE_PROVIDER_SITE_OTHER): Payer: Medicare Other | Admitting: Internal Medicine

## 2018-01-09 VITALS — BP 124/82 | HR 72 | Ht 70.0 in | Wt 137.6 lb

## 2018-01-09 DIAGNOSIS — K625 Hemorrhage of anus and rectum: Secondary | ICD-10-CM

## 2018-01-09 DIAGNOSIS — K5900 Constipation, unspecified: Secondary | ICD-10-CM

## 2018-01-09 DIAGNOSIS — K602 Anal fissure, unspecified: Secondary | ICD-10-CM

## 2018-01-09 NOTE — Progress Notes (Signed)
HISTORY OF PRESENT ILLNESS:  Troy Marsh is a 67 y.o. male with a history of HIV disease, hypertension, and hypogonadism. Previous colonoscopy in 2003 and 2015 were negative. He was last seen in this office 05/23/2017 regarding symptomatic posterior anal fissure and chronic constipation. Patient presents himself today for routine follow-up of these issues. He completed diltiazem therapy and his rectal pain with defecation has resolved. He continues to struggle with constipation reporting one bowel movement a week. He tends to use MiraLAX 1 dose daily. Intermittently uses fiber and stool softeners. He tells me that the problem is with increasing any of these agents he will develop diarrhea. He continues to have very minor rectal bleeding with defecation. His GI review of systems is otherwise negative. He continues his ongoing general medical care with Plotnikov. He recently underwent complex cervical spine surgery and continues to recover  REVIEW OF SYSTEMS:  All non-GI ROS negative unless otherwise stated in the history of present illness rthritis  Past Medical History:  Diagnosis Date  . Arthritis   . Cancer ([place])    Skin cancer- basal and squamous  . Constipation due to pain medication   . HIV disease ([place]) 1985   last CD4 10/02/12 was 277  . Hypertension   . Hypogonadism male     Past Surgical History:  Procedure Laterality Date  . ANTERIOR CERVICAL DECOMP/DISCECTOMY FUSION Bilateral 11/22/2017   Procedure: ANTERIOR CERVICAL DECOMPRESSION FUSION CERVICAL 4-5, CERVICAL 5-6, CERVICAL 6-7 WITH INSTRUMENTATION AND ALLOGRAFT TIME      /   REQUESTED 4 HOURS;  Surgeon: Phylliss Bob, MD;  Location: [facility];  Service: Orthopedics;  Laterality: Bilateral;  . COLONOSCOPY W/ POLYPECTOMY    . EYE SURGERY Bilateral    Lasik, Cataract  . [facility]  . KNEE ARTHROSCOPY Right 2009  . KNEE ARTHROSCOPY Left 2011  . neck surgery  1984    Social History Troy Marsh  reports  that he quit smoking about 13 years ago. His smoking use included cigarettes. He has a 70.00 pack-year smoking history. He has never used smokeless tobacco. He reports that he does not drink alcohol or use drugs.  family history includes Cancer in his father; Coronary artery disease in his father; Heart attack in his father; Hyperlipidemia in his father; Hypertension in his father.  No Known Allergies     PHYSICAL EXAMINATION: Vital signs: BP 124/82   Pulse 72   Ht 5\' 10"  (1.778 m)   Wt 137 lb 9.6 oz (62.4 kg)   BMI 19.74 kg/m   Constitutional: thin, no acute distress Psychiatric: alert and oriented x3, cooperative Eyes: extraocular movements intact, anicteric, conjunctiva pink Mouth: oral pharynx moist, no lesions Neck: supple no lymphadenopathy Cardiovascular: heart regular rate and rhythm, no murmur Lungs: clear to auscultation bilaterally Abdomen: soft, nontender, nondistended, no obvious ascites, no peritoneal signs, normal bowel sounds, no organomegaly Rectal:omitted Extremities: no clubbing, cyanosis, or lower extremity edema bilaterally Skin: no lesions on visible extremities Neuro: No focal deficits.    ASSESSMENT:  #1. Posterior anal fissure. Resolved #2. Minor intermittent rectal bleeding due to known hemorrhoids #3. Chronic constipation. Ongoing   PLAN:  #1. Advised that he continue MiraLAX though he may increase the dose to 1-1/2 scoops daily #2. Also advised that he introduce fiber supplementation such as Metamucil. The combination of the tube may provide a more favorable bowel consistency #3. Routine screening colonoscopy around September 2025. Interval GI follow-up as needed. Resume general medical care with Dr.  Plotnikov  15 minutes spent face-to-face with the patient. Greater than 50% a time use for counseling regarding management of his chronic constipation

## 2018-01-09 NOTE — Patient Instructions (Signed)
Please follow up as needed 

## 2018-01-20 ENCOUNTER — Other Ambulatory Visit: Payer: Self-pay | Admitting: Internal Medicine

## 2018-01-21 ENCOUNTER — Telehealth: Payer: Self-pay | Admitting: Internal Medicine

## 2018-01-21 MED ORDER — HYDROCORTISONE ACETATE 25 MG RE SUPP: RECTAL | 1 refills | Status: DC

## 2018-01-21 NOTE — Telephone Encounter (Signed)
Sent 90 day supply to [facility]

## 2018-01-25 ENCOUNTER — Other Ambulatory Visit: Payer: Self-pay | Admitting: Orthopedic Surgery

## 2018-01-25 DIAGNOSIS — M47816 Spondylosis without myelopathy or radiculopathy, lumbar region: Secondary | ICD-10-CM

## 2018-01-30 ENCOUNTER — Other Ambulatory Visit: Payer: Self-pay | Admitting: Internal Medicine

## 2018-01-31 ENCOUNTER — Other Ambulatory Visit: Payer: Self-pay | Admitting: Internal Medicine

## 2018-01-31 NOTE — Telephone Encounter (Signed)
Copied from [place] [id number]. Topic: Quick Communication - Rx Refill/Question >> Jan 31, 2018  9:29 AM Neva Seat wrote: testosterone enanthate (DELATESTRYL) 200 MG/ML injection  Pt needing refills  [facility] [address] [place] [address] Phone: [phone] Fax: [phone]

## 2018-01-31 NOTE — Telephone Encounter (Signed)
Refill of testosterone enanthate  LRF 10/11/17  5cc   5 refills  LOV 10/23/17 Dr. Alain Marion   [facility] [address] [place] [address] Phone: [phone] Fax: [phone]

## 2018-02-06 ENCOUNTER — Other Ambulatory Visit: Payer: Self-pay | Admitting: Internal Medicine

## 2018-02-27 ENCOUNTER — Ambulatory Visit (INDEPENDENT_AMBULATORY_CARE_PROVIDER_SITE_OTHER): Payer: Medicare Other | Admitting: Internal Medicine

## 2018-02-27 ENCOUNTER — Encounter: Payer: Self-pay | Admitting: Internal Medicine

## 2018-02-27 DIAGNOSIS — H6690 Otitis media, unspecified, unspecified ear: Secondary | ICD-10-CM | POA: Insufficient documentation

## 2018-02-27 MED ORDER — CEFUROXIME AXETIL 250 MG PO TABS: 250.0000 mg | ORAL_TABLET | Freq: Two times a day (BID) | ORAL | 0 refills | Status: AC

## 2018-02-27 NOTE — Progress Notes (Signed)
Subjective:  Patient ID: Troy Marsh, male    DOB: 1951/07/09  Age: 67 y.o. MRN: [id number]  CC: No chief complaint on file.   HPI Troy Marsh presents for sinus congestion x days, ear congestion on the L and blood since 8/13   Outpatient Medications Prior to Visit  Medication Sig Dispense Refill  . Alprostadil, Vasodilator, (CAVERJECT) 20 MCG SOLR INJECT 1 DOSE (PENILE  INJECTION) EVERY DAY AS  NEEDED AS DIRECTED 18 each 2  . aspirin 81 MG tablet Take 81 mg by mouth daily.      [name] atorvastatin (LIPITOR) 40 MG tablet Take 40 mg by mouth daily.     . baclofen (LIORESAL) 10 MG tablet Take 1 tablet (10 mg total) by mouth 2 (two) times daily as needed. (Patient taking differently: Take 10 mg by mouth 3 (three) times daily as needed for muscle spasms. ) 90 each 0  . celecoxib (CELEBREX) 200 MG capsule TAKE 1 CAPSULE BY MOUTH TWO TIMES DAILY 180 capsule 1  . cetirizine-pseudoephedrine (ZYRTEC-D ALLERGY & CONGESTION) 5-120 MG tablet Take 1 tablet by mouth daily as needed.     . Cholecalciferol (VITAMIN D3) 5000 units TABS Take 5,000 Units by mouth daily.    [name] docusate sodium (COLACE) 100 MG capsule Take 100 mg by mouth at bedtime as needed for mild constipation.    [name] emtricitabine-rilpivir-tenofovir AF (ODEFSEY) 200-25-25 MG TABS tablet Take 1 tablet by mouth daily.    [name] gabapentin (NEURONTIN) 100 MG capsule Take 1 capsule (100 mg total) by mouth 3 (three) times daily. (Patient taking differently: Take 100 mg by mouth See admin instructions. TAKE 1 CAPSULE (100 MG) BY MOUTH 2-3 TIMES DAILY) 42 capsule 0  . hydrochlorothiazide (HYDRODIURIL) 50 MG tablet TAKE 1 TABLET BY MOUTH  DAILY 90 tablet 2  . hydrocortisone (ANUSOL-HC) 25 MG suppository UNWRAP AND INSERT 1  SUPPOSITORY RECTALLY AT  BEDTIME 90 suppository 1  . Melatonin 10 MG TABS Take 10 mg by mouth at bedtime as needed (FOR SLEEP.).    [name] mirabegron ER (MYRBETRIQ) 25 MG TB24 tablet Take 25 mg by mouth at bedtime.     . montelukast  (SINGULAIR) 10 MG tablet TAKE 1 TABLET BY MOUTH  DAILY 90 tablet 2  . Multiple Vitamin (MULTIVITAMIN WITH MINERALS) TABS tablet Take 1 tablet by mouth daily. COMPLETE MULTIVITAMIN MEN'S 50+    . polyethylene glycol powder (GLYCOLAX/MIRALAX) powder Take 17 g by mouth daily as needed (FOR CONSTIPATION.).    [name] potassium chloride (K-DUR) 10 MEQ tablet TAKE 1 TABLET BY MOUTH  DAILY 90 tablet 3  . RESTASIS 0.05 % ophthalmic emulsion Place 1 drop into both eyes 2 (two) times daily.    . tadalafil (CIALIS) 20 MG tablet Take 20 mg by mouth daily.    [name] testosterone enanthate (DELATESTRYL) 200 MG/ML injection INJECT 1 ML INTRAMUSCULARLY EVERY 14 DAYS . DISCARD 28 DAYS AFTER INITIAL USE. 5 mL 3   No facility-administered medications prior to visit.     ROS: Review of Systems  HENT: Positive for ear pain and hearing loss.     Objective:  BP 128/82 (BP Location: Left Arm, Patient Position: Sitting, Cuff Size: Normal)   Pulse 66   Temp 97.8 F (36.6 C) (Oral)   Ht 5\' 10"  (1.778 m)   Wt 141 lb (64 kg)   SpO2 98%   BMI 20.23 kg/m   BP Readings from Last 3 Encounters:  02/27/18 128/82  01/09/18 124/82  01/04/18 [name])  152/74    Wt Readings from Last 3 Encounters:  02/27/18 141 lb (64 kg)  01/09/18 137 lb 9.6 oz (62.4 kg)  11/22/17 142 lb (64.4 kg)    Physical Exam  Constitutional: He is oriented to person, place, and time. He appears well-developed. No distress.  NAD  HENT:  Right Ear: External ear normal.  Mouth/Throat: Oropharynx is clear and moist.  Eyes: Pupils are equal, round, and reactive to light. Conjunctivae are normal.  Neck: Normal range of motion. No JVD present. No thyromegaly present.  Cardiovascular: Normal rate, regular rhythm, normal heart sounds and intact distal pulses. Exam reveals no gallop and no friction rub.  No murmur heard. Pulmonary/Chest: Effort normal and breath sounds normal. No respiratory distress. He has no wheezes. He has no rales. He exhibits no  tenderness.  Abdominal: Soft. Bowel sounds are normal. He exhibits no distension and no mass. There is no tenderness. There is no rebound and no guarding.  Musculoskeletal: Normal range of motion. He exhibits no edema or tenderness.  Lymphadenopathy:    He has no cervical adenopathy.  Neurological: He is alert and oriented to person, place, and time. He has normal reflexes. No cranial nerve deficit. He exhibits normal muscle tone. He displays a negative Romberg sign. Coordination and gait normal.  Skin: Skin is warm and dry. No rash noted.  Psychiatric: He has a normal mood and affect. His behavior is normal. Judgment and thought content normal.    Blood in the L ear canal  Lab Results  Component Value Date   WBC 4.8 11/13/2017   HGB 17.8 (H) 11/13/2017   HCT 50.6 11/13/2017   PLT 147 (L) 11/13/2017   GLUCOSE 60 (L) 11/13/2017   CHOL 160 04/24/2017   TRIG 61.0 04/24/2017   HDL 80.70 04/24/2017   LDLDIRECT 113.3 05/21/2006   LDLCALC 67 04/24/2017   ALT 24 11/13/2017   AST 26 11/13/2017   NA 136 11/13/2017   K 3.4 (L) 11/13/2017   CL 94 (L) 11/13/2017   CREATININE 1.35 (H) 11/13/2017   BUN 7 11/13/2017   CO2 33 (H) 11/13/2017   TSH 3.21 10/23/2017   PSA 1.40 10/23/2017   INR 1.03 11/13/2017    Dg Inject Diag/thera/inc Needle/cath/plc Epi/lumb/sac W/img  Result Date: 01/04/2018 CLINICAL DATA:  Lumbosacral spondylosis without myelopathy. Displacement of the L4-5 and L5-S1 lumbar disc. FLUOROSCOPY TIME:  Radiation Exposure Index (as provided by the fluoroscopic device): 6.67 Fluoroscopy Time:  11 seconds Number of Acquired Images:  0 PROCEDURE: The procedure, risks, benefits, and alternatives were explained to the patient. Questions regarding the procedure were encouraged and answered. The patient understands and consents to the procedure. LUMBAR EPIDURAL INJECTION: An interlaminar approach was performed on left at L5-S1. The overlying skin was cleansed and anesthetized. A 20 gauge  epidural needle was advanced using loss-of-resistance technique. DIAGNOSTIC EPIDURAL INJECTION: Injection of Isovue-M 200 shows a good epidural pattern with spread above and below the level of needle placement, primarily on the left no vascular opacification is seen. THERAPEUTIC EPIDURAL INJECTION: 120 mg of Depo-Medrol mixed with 3 mL 1% lidocaine were instilled. The procedure was well-tolerated, and the patient was discharged thirty minutes following the injection in good condition. COMPLICATIONS: None IMPRESSION: Technically successful epidural injection on the left L4-5 # 3 Electronically Signed   By: San Morelle M.D.   On: 01/04/2018 11:17    Assessment & Plan:   There are no diagnoses linked to this encounter.   No orders of the  defined types were placed in this encounter.    Follow-up: No follow-ups on file.  [name], MD

## 2018-02-27 NOTE — Assessment & Plan Note (Signed)
?  L TM rupture PO abx ENT ref

## 2018-03-07 ENCOUNTER — Ambulatory Visit
Admission: RE | Admit: 2018-03-07 | Discharge: 2018-03-07 | Disposition: A | Discharge status: Home / Self Care | Payer: Medicare Other | Source: Ambulatory Visit | Attending: Orthopedic Surgery | Admitting: Orthopedic Surgery

## 2018-03-07 DIAGNOSIS — M47816 Spondylosis without myelopathy or radiculopathy, lumbar region: Secondary | ICD-10-CM

## 2018-03-07 MED ORDER — METHYLPREDNISOLONE ACETATE 40 MG/ML INJ SUSP (RADIOLOG
120.0000 mg | Freq: Once | INTRAMUSCULAR | Status: AC
  Administered 2018-03-07: 120 mg via EPIDURAL

## 2018-03-07 MED ORDER — IOPAMIDOL (ISOVUE-M 200) INJECTION 41%
1.0000 mL | Freq: Once | INTRAMUSCULAR | Status: AC
  Administered 2018-03-07: 1 mL via EPIDURAL

## 2018-03-30 ENCOUNTER — Other Ambulatory Visit: Payer: Self-pay | Admitting: Internal Medicine

## 2018-04-10 ENCOUNTER — Other Ambulatory Visit: Payer: Self-pay | Admitting: Internal Medicine

## 2018-04-15 ENCOUNTER — Other Ambulatory Visit: Payer: Self-pay | Admitting: Internal Medicine

## 2018-04-24 ENCOUNTER — Encounter: Payer: Self-pay | Admitting: Internal Medicine

## 2018-04-24 ENCOUNTER — Other Ambulatory Visit (INDEPENDENT_AMBULATORY_CARE_PROVIDER_SITE_OTHER): Payer: Medicare Other

## 2018-04-24 ENCOUNTER — Telehealth: Payer: Self-pay | Admitting: Internal Medicine

## 2018-04-24 ENCOUNTER — Ambulatory Visit (INDEPENDENT_AMBULATORY_CARE_PROVIDER_SITE_OTHER): Payer: Medicare Other | Admitting: Internal Medicine

## 2018-04-24 VITALS — BP 122/78 | HR 68 | Temp 98.2°F | Ht 70.0 in | Wt 139.0 lb

## 2018-04-24 DIAGNOSIS — Z23 Encounter for immunization: Secondary | ICD-10-CM | POA: Diagnosis not present

## 2018-04-24 DIAGNOSIS — Z21 Asymptomatic human immunodeficiency virus [HIV] infection status: Secondary | ICD-10-CM | POA: Diagnosis not present

## 2018-04-24 DIAGNOSIS — I10 Essential (primary) hypertension: Secondary | ICD-10-CM | POA: Diagnosis not present

## 2018-04-24 DIAGNOSIS — E291 Testicular hypofunction: Secondary | ICD-10-CM

## 2018-04-24 DIAGNOSIS — R972 Elevated prostate specific antigen [PSA]: Secondary | ICD-10-CM | POA: Diagnosis not present

## 2018-04-24 DIAGNOSIS — K4090 Unilateral inguinal hernia, without obstruction or gangrene, not specified as recurrent: Secondary | ICD-10-CM

## 2018-04-24 LAB — CBC WITH DIFFERENTIAL/PLATELET
BASOS PCT: 1 % (ref 0.0–3.0)
Basophils Absolute: 0 10*3/uL (ref 0.0–0.1)
EOS PCT: 1.5 % (ref 0.0–5.0)
Eosinophils Absolute: 0.1 10*3/uL (ref 0.0–0.7)
HCT: 48.6 % (ref 39.0–52.0)
Hemoglobin: 17.2 g/dL — ABNORMAL HIGH (ref 13.0–17.0)
LYMPHS ABS: 1.2 10*3/uL (ref 0.7–4.0)
Lymphocytes Relative: 25.5 % (ref 12.0–46.0)
MCHC: 35.4 g/dL (ref 30.0–36.0)
MCV: 95.9 fl (ref 78.0–100.0)
MONO ABS: 0.5 10*3/uL (ref 0.1–1.0)
Monocytes Relative: 11.2 % (ref 3.0–12.0)
NEUTROS ABS: 2.9 10*3/uL (ref 1.4–7.7)
NEUTROS PCT: 60.8 % (ref 43.0–77.0)
PLATELETS: 189 10*3/uL (ref 150.0–400.0)
RBC: 5.07 Mil/uL (ref 4.22–5.81)
RDW: 13.6 % (ref 11.5–15.5)
WBC: 4.8 10*3/uL (ref 4.0–10.5)

## 2018-04-24 LAB — HEPATIC FUNCTION PANEL
ALBUMIN: 4.1 g/dL (ref 3.5–5.2)
ALT: 15 U/L (ref 0–53)
AST: 19 U/L (ref 0–37)
Alkaline Phosphatase: 52 U/L (ref 39–117)
Bilirubin, Direct: 0.2 mg/dL (ref 0.0–0.3)
TOTAL PROTEIN: 6.1 g/dL (ref 6.0–8.3)
Total Bilirubin: 0.8 mg/dL (ref 0.2–1.2)

## 2018-04-24 LAB — BASIC METABOLIC PANEL
BUN: 13 mg/dL (ref 6–23)
CHLORIDE: 94 meq/L — AB (ref 96–112)
CO2: 35 mEq/L — ABNORMAL HIGH (ref 19–32)
Calcium: 9.4 mg/dL (ref 8.4–10.5)
Creatinine, Ser: 1.21 mg/dL (ref 0.40–1.50)
GFR: 63.34 mL/min (ref >60.00)
GLUCOSE: 96 mg/dL (ref 70–99)
POTASSIUM: 3.8 meq/L (ref 3.5–5.1)
SODIUM: 135 meq/L (ref 135–145)

## 2018-04-24 LAB — PSA: PSA: 3.47 ng/mL (ref 0.10–4.00)

## 2018-04-24 LAB — TESTOSTERONE: Testosterone: 723.83 ng/dL (ref 300.00–890.00)

## 2018-04-24 NOTE — Assessment & Plan Note (Signed)
On treatment.  

## 2018-04-24 NOTE — Assessment & Plan Note (Signed)
R - surg ref

## 2018-04-24 NOTE — Progress Notes (Signed)
Subjective:  Patient ID: Troy Marsh, male    DOB: 1950-12-05  Age: 67 y.o. MRN: [id number]  CC: No chief complaint on file.   HPI DENARD TUMINELLO presents for OAB, HIV, ED f/u C/o R groin swelling  Outpatient Medications Prior to Visit  Medication Sig Dispense Refill  . Alprostadil, Vasodilator, (CAVERJECT) 20 MCG SOLR INJECT 1 DOSE DAILY AS  NEEDED AS DIRECTED (DISCARD UNUSED PORTION AFTER FIRST  USE) 18 each 2  . aspirin 81 MG tablet Take 81 mg by mouth daily.      [name] atorvastatin (LIPITOR) 40 MG tablet Take 40 mg by mouth daily.     . B-D TB SYRINGE 1CC/27GX1/2" 27G X 1/2" 1 ML MISC USE TO ADMINISTER CAVERJECT INJECTION, 3 USES PER WEEK  WITH AT LEAST 24 HOURS IN  BETWEEN EACH DOSE. 50 each 3  . baclofen (LIORESAL) 10 MG tablet Take 1 tablet (10 mg total) by mouth 2 (two) times daily as needed. (Patient taking differently: Take 10 mg by mouth 3 (three) times daily as needed for muscle spasms. ) 90 each 0  . celecoxib (CELEBREX) 200 MG capsule TAKE 1 CAPSULE BY MOUTH TWO TIMES DAILY 180 capsule 1  . cetirizine-pseudoephedrine (ZYRTEC-D ALLERGY & CONGESTION) 5-120 MG tablet Take 1 tablet by mouth daily as needed.     . Cholecalciferol (VITAMIN D3) 5000 units TABS Take 5,000 Units by mouth daily.    [name] docusate sodium (COLACE) 100 MG capsule Take 100 mg by mouth at bedtime as needed for mild constipation.    [name] emtricitabine-rilpivir-tenofovir AF (ODEFSEY) 200-25-25 MG TABS tablet Take 1 tablet by mouth daily.    [name] gabapentin (NEURONTIN) 100 MG capsule Take 1 capsule (100 mg total) by mouth 3 (three) times daily. (Patient taking differently: Take 100 mg by mouth See admin instructions. TAKE 1 CAPSULE (100 MG) BY MOUTH 2-3 TIMES DAILY) 42 capsule 0  . hydrochlorothiazide (HYDRODIURIL) 50 MG tablet TAKE 1 TABLET BY MOUTH  DAILY 90 tablet 2  . hydrocortisone (ANUCORT-HC) 25 MG suppository UNWRAP AND INSERT 1  SUPPOSITORY RECTALLY AT  BEDTIME 96 suppository 1  . Melatonin 10 MG TABS Take  10 mg by mouth at bedtime as needed (FOR SLEEP.).    [name] mirabegron ER (MYRBETRIQ) 25 MG TB24 tablet Take 25 mg by mouth at bedtime.     . montelukast (SINGULAIR) 10 MG tablet TAKE 1 TABLET BY MOUTH  DAILY 90 tablet 2  . Multiple Vitamin (MULTIVITAMIN WITH MINERALS) TABS tablet Take 1 tablet by mouth daily. COMPLETE MULTIVITAMIN MEN'S 50+    . polyethylene glycol powder (GLYCOLAX/MIRALAX) powder Take 17 g by mouth daily as needed (FOR CONSTIPATION.).    [name] potassium chloride (K-DUR) 10 MEQ tablet TAKE 1 TABLET BY MOUTH  DAILY 90 tablet 3  . RESTASIS 0.05 % ophthalmic emulsion Place 1 drop into both eyes 2 (two) times daily.    . tadalafil (CIALIS) 20 MG tablet Take 20 mg by mouth daily.    [name] testosterone enanthate (DELATESTRYL) 200 MG/ML injection INJECT 1 ML INTRAMUSCULARLY EVERY 14 DAYS . DISCARD 28 DAYS AFTER INITIAL USE. 5 mL 3   No facility-administered medications prior to visit.     ROS: Review of Systems  Constitutional: Negative for appetite change, fatigue and unexpected weight change.  HENT: Negative for congestion, nosebleeds, sneezing, sore throat and trouble swallowing.   Eyes: Negative for itching and visual disturbance.  Respiratory: Negative for cough.   Cardiovascular: Negative for chest pain, palpitations and leg  swelling.  Gastrointestinal: Negative for abdominal distention, blood in stool, diarrhea and nausea.  Genitourinary: Negative for frequency and hematuria.  Musculoskeletal: Negative for back pain, gait problem, joint swelling and neck pain.  Skin: Negative for rash.  Neurological: Negative for dizziness, tremors, speech difficulty and weakness.  Psychiatric/Behavioral: Negative for agitation, dysphoric mood, sleep disturbance and suicidal ideas. The patient is not nervous/anxious.     Objective:  BP 122/78 (BP Location: Left Arm, Patient Position: Sitting, Cuff Size: Normal)   Pulse 68   Temp 98.2 F (36.8 C) (Oral)   Ht 5\' 10"  (1.778 m)   Wt 139 lb (63 kg)    SpO2 98%   BMI 19.94 kg/m   BP Readings from Last 3 Encounters:  04/24/18 122/78  03/07/18 131/77  02/27/18 128/82    Wt Readings from Last 3 Encounters:  04/24/18 139 lb (63 kg)  02/27/18 141 lb (64 kg)  01/09/18 137 lb 9.6 oz (62.4 kg)    Physical Exam  Constitutional: He is oriented to person, place, and time. He appears well-developed. No distress.  NAD  HENT:  Mouth/Throat: Oropharynx is clear and moist.  Eyes: Pupils are equal, round, and reactive to light. Conjunctivae are normal.  Neck: Normal range of motion. No JVD present. No thyromegaly present.  Cardiovascular: Normal rate, regular rhythm, normal heart sounds and intact distal pulses. Exam reveals no gallop and no friction rub.  No murmur heard. Pulmonary/Chest: Effort normal and breath sounds normal. No respiratory distress. He has no wheezes. He has no rales. He exhibits no tenderness.  Abdominal: Soft. Bowel sounds are normal. He exhibits no distension and no mass. There is no tenderness. There is no rebound and no guarding.  Musculoskeletal: Normal range of motion. He exhibits no edema or tenderness.  Lymphadenopathy:    He has no cervical adenopathy.  Neurological: He is alert and oriented to person, place, and time. He has normal reflexes. No cranial nerve deficit. He exhibits normal muscle tone. He displays a negative Romberg sign. Coordination and gait normal.  Skin: Skin is warm and dry. No rash noted.  Psychiatric: He has a normal mood and affect. His behavior is normal. Judgment and thought content normal.  Thin R ing hernia NT  Lab Results  Component Value Date   WBC 4.8 11/13/2017   HGB 17.8 (H) 11/13/2017   HCT 50.6 11/13/2017   PLT 147 (L) 11/13/2017   GLUCOSE 60 (L) 11/13/2017   CHOL 160 04/24/2017   TRIG 61.0 04/24/2017   HDL 80.70 04/24/2017   LDLDIRECT 113.3 05/21/2006   LDLCALC 67 04/24/2017   ALT 24 11/13/2017   AST 26 11/13/2017   NA 136 11/13/2017   K 3.4 (L) 11/13/2017   CL 94  (L) 11/13/2017   CREATININE 1.35 (H) 11/13/2017   BUN 7 11/13/2017   CO2 33 (H) 11/13/2017   TSH 3.21 10/23/2017   PSA 1.40 10/23/2017   INR 1.03 11/13/2017    Dg Inject Diag/thera/inc Needle/cath/plc Epi/lumb/sac W/img  Result Date: 03/07/2018 CLINICAL DATA:  Osteoarthritis of the lumbar spine. Unspecified spinal osteoarthritis complications status. FLUOROSCOPY TIME:  Radiation Exposure Index (as provided by the fluoroscopic device): 9.63 uGy*m2 Fluoroscopy Time:  16 seconds Number of Acquired Images:  0 PROCEDURE: The procedure, risks, benefits, and alternatives were explained to the patient. Questions regarding the procedure were encouraged and answered. The patient understands and consents to the procedure. LUMBAR EPIDURAL INJECTION: An interlaminar approach was performed on left at L4-5. The overlying skin was cleansed  and anesthetized. A 20 gauge epidural needle was advanced using loss-of-resistance technique. DIAGNOSTIC EPIDURAL INJECTION: Injection of Isovue-M 200 shows a good epidural pattern with spread above and below the level of needle placement, primarily on the left no vascular opacification is seen. THERAPEUTIC EPIDURAL INJECTION: 120 mg of Depo-Medrol mixed with 3 mL 1% lidocaine were instilled. The procedure was well-tolerated, and the patient was discharged thirty minutes following the injection in good condition. COMPLICATIONS: None IMPRESSION: Technically successful epidural injection on the left L4-5 # 3 Electronically Signed   By: San Morelle M.D.   On: 03/07/2018 10:11    Assessment & Plan:   There are no diagnoses linked to this encounter.   No orders of the defined types were placed in this encounter.    Follow-up: No follow-ups on file.  [name], MD

## 2018-04-24 NOTE — Telephone Encounter (Unsigned)
Copied from [place] [id number]. Topic: Quick Communication - Rx Refill/Question >> Apr 24, 2018  2:18 PM Judyann Munson wrote: Medication: Alprostadil, Vasodilator, (CAVERJECT) 20 MCG SOLR  Has the patient contacted their pharmacy? Yes  Preferred Pharmacy (with phone number or street name): [facility], [place] [phone] (Phone) [phone] (Fax)    Agent: Please be advised that RX refills may take up to 3 business days. We ask that you follow-up with your pharmacy.

## 2018-04-24 NOTE — Assessment & Plan Note (Signed)
NAS diet ?HCTZ ?Kdur ?

## 2018-04-24 NOTE — Addendum Note (Signed)
Addended by: Karren Cobble on: 04/24/2018 09:25 AM   Modules accepted: Orders

## 2018-04-24 NOTE — Assessment & Plan Note (Signed)
PSA

## 2018-04-24 NOTE — Patient Instructions (Signed)
Inguinal Hernia, Adult An inguinal hernia is when fat or the intestines push through the area where the leg meets the lower belly (groin) and make a rounded lump (bulge). This condition happens over time. There are three types of inguinal hernias. These types include:  Hernias that can be pushed back into the belly (are reducible).  Hernias that cannot be pushed back into the belly (are incarcerated).  Hernias that cannot be pushed back into the belly and lose their blood supply (get strangulated). This type needs emergency surgery.  Follow these instructions at home: Lifestyle  Drink enough fluid to keep your urine (pee) clear or pale yellow.  Eat plenty of fruits, vegetables, and whole grains. These have a lot of fiber. Talk with your doctor if you have questions.  Avoid lifting heavy objects.  Avoid standing for long periods of time.  Do not use tobacco products. These include cigarettes, chewing tobacco, or e-cigarettes. If you need help quitting, ask your doctor.  Try to stay at a healthy weight. General instructions  Do not try to force the hernia back in.  Watch your hernia for any changes in color or size. Let your doctor know if there are any changes.  Take over-the-counter and prescription medicines only as told by your doctor.  Keep all follow-up visits as told by your doctor. This is important. Contact a doctor if:  You have a fever.  You have new symptoms.  Your symptoms get worse. Get help right away if:  The area where the legs meets the lower belly has: ? Pain that gets worse suddenly. ? A bulge that gets bigger suddenly and does not go down. ? A bulge that turns red or purple. ? A bulge that is painful to the touch.  You are a man and your scrotum: ? Suddenly feels painful. ? Suddenly changes in size.  You feel sick to your stomach (nauseous) and this feeling does not go away.  You throw up (vomit) and this keeps happening.  You feel your heart  beating a lot more quickly than normal.  You cannot poop (have a bowel movement) or pass gas. This information is not intended to replace advice given to you by your health care provider. Make sure you discuss any questions you have with your health care provider. Document Released: 08/03/2006 Document Revised: 12/09/2015 Document Reviewed: 05/13/2014 Elsevier Interactive Patient Education  2018 [employer] Inc[employer]  

## 2018-04-25 NOTE — Telephone Encounter (Signed)
Sent to pharmacy as: Alprostadil, Vasodilator, (CAVERJECT) 20 MCG Recon Soln   E-Prescribing Status: Receipt confirmed by pharmacy (04/15/2018 3:46 PM EDT)

## 2018-04-26 ENCOUNTER — Other Ambulatory Visit: Payer: Self-pay | Admitting: Internal Medicine

## 2018-04-26 ENCOUNTER — Other Ambulatory Visit: Payer: Self-pay

## 2018-04-26 DIAGNOSIS — D582 Other hemoglobinopathies: Secondary | ICD-10-CM

## 2018-04-26 NOTE — Telephone Encounter (Signed)
Has high hemoglobin and should not be on testosterone shots. Will not fill. PCP can evaluate if appropriate.

## 2018-04-26 NOTE — Telephone Encounter (Signed)
Please advise about refill in Dr. Enis Slipper absence.  Pt states they have no refills even though we sent refills

## 2018-04-29 ENCOUNTER — Other Ambulatory Visit: Payer: Self-pay | Admitting: Orthopedic Surgery

## 2018-04-29 DIAGNOSIS — M542 Cervicalgia: Secondary | ICD-10-CM

## 2018-05-03 ENCOUNTER — Telehealth: Payer: Self-pay | Admitting: Internal Medicine

## 2018-05-03 NOTE — Telephone Encounter (Signed)
No problem.

## 2018-05-03 NOTE — Telephone Encounter (Signed)
Pt wants to switch from Dr Henrene Pastor to Dr Lyndel Safe wants a second opinion and knows him from [facility]

## 2018-05-03 NOTE — Telephone Encounter (Signed)
Dr. Lyndel Safe will you accept this pt? See note below.

## 2018-05-03 NOTE — Telephone Encounter (Signed)
Forwarding msg to [facility] to contact pt concerning referral.../lmb

## 2018-05-03 NOTE — Telephone Encounter (Signed)
See below, ok to schedule with Lyndel Safe.

## 2018-05-03 NOTE — Telephone Encounter (Signed)
Please advise if you will approve the switch, see note below.

## 2018-05-06 ENCOUNTER — Ambulatory Visit: Payer: Self-pay | Admitting: General Surgery

## 2018-05-07 ENCOUNTER — Ambulatory Visit
Admission: RE | Admit: 2018-05-07 | Discharge: 2018-05-07 | Disposition: A | Discharge status: Home / Self Care | Payer: Medicare Other | Source: Ambulatory Visit | Attending: Orthopedic Surgery | Admitting: Orthopedic Surgery

## 2018-05-07 ENCOUNTER — Other Ambulatory Visit: Payer: Self-pay | Admitting: Orthopedic Surgery

## 2018-05-07 DIAGNOSIS — M5136 Other intervertebral disc degeneration, lumbar region: Secondary | ICD-10-CM

## 2018-05-07 DIAGNOSIS — M542 Cervicalgia: Secondary | ICD-10-CM

## 2018-05-09 ENCOUNTER — Encounter: Payer: Self-pay | Admitting: Gastroenterology

## 2018-05-09 ENCOUNTER — Ambulatory Visit (INDEPENDENT_AMBULATORY_CARE_PROVIDER_SITE_OTHER): Payer: Medicare Other | Admitting: Gastroenterology

## 2018-05-09 VITALS — BP 108/60 | HR 67 | Ht 70.0 in | Wt 142.1 lb

## 2018-05-09 DIAGNOSIS — K602 Anal fissure, unspecified: Secondary | ICD-10-CM

## 2018-05-09 DIAGNOSIS — K5900 Constipation, unspecified: Secondary | ICD-10-CM

## 2018-05-09 MED ORDER — LINACLOTIDE 72 MCG PO CAPS: 72.0000 ug | ORAL_CAPSULE | Freq: Every day | ORAL | 0 refills | Status: DC

## 2018-05-09 NOTE — Patient Instructions (Signed)
If you are age 68 or older, your body mass index should be between 23-30. Your Body mass index is 20.39 kg/m. If this is out of the aforementioned range listed, please consider follow up with your Primary Care Provider.  If you are age 16 or younger, your body mass index should be between 19-25. Your Body mass index is 20.39 kg/m. If this is out of the aformentioned range listed, please consider follow up with your Primary Care Provider.   We have given you samples of the following medication to take: Linzess 72 mcg once daily.   Thank you,  Dr. Jackquline Denmark

## 2018-05-09 NOTE — Progress Notes (Signed)
Chief Complaint: FU  Referring Provider:  Cassandria Anger, MD      ASSESSMENT AND PLAN;   #1. H/O Anal fissure and internal hemorrhoids (better)  #2. H/O constipation (uses hydrocodone, Celebrex), neg colon 03/2014, 07/2001.  Plan: - Minimize use of pain medications - Fibercon 1 po bid with plenty of water. - Prunes 2/day.  - Prp H and anacort 2.5% suppostories bid x 14 days to continue. - Miralax 17g po qd as needed - Linzess samples 75 mcg 1 p.o. once a day - Awaiting inguinal hernia repair by Dr. Rosendo Gros. - FU in 6 months.  Earlier, if still with problems.   HPI:    Troy Marsh is a 67 y.o. male  For follow-up visit Doing very well Still has constipation-better with FiberCon twice a day, plenty of water Denies having any rectal bleeding or rectal pain.  He has been using Anacort 2.5% suppositories on as-needed basis. Has not been drinking enough water. No abdominal pain Does use hydrocodone every day for back pain No melena or hematochezia  Married to [employer] (patient of ours)  Past Medical History:  Diagnosis Date  . Arthritis   . Cancer ([place])    Skin cancer- basal and squamous  . Constipation due to pain medication   . HIV disease ([place]) 1985   last CD4 10/02/12 was 277  . Hypertension   . Hypogonadism male     Past Surgical History:  Procedure Laterality Date  . ANTERIOR CERVICAL DECOMP/DISCECTOMY FUSION Bilateral 11/22/2017   Procedure: ANTERIOR CERVICAL DECOMPRESSION FUSION CERVICAL 4-5, CERVICAL 5-6, CERVICAL 6-7 WITH INSTRUMENTATION AND ALLOGRAFT TIME      /   REQUESTED 4 HOURS;  Surgeon: Phylliss Bob, MD;  Location: [facility];  Service: Orthopedics;  Laterality: Bilateral;  . COLONOSCOPY W/ POLYPECTOMY    . EYE SURGERY Bilateral    Lasik, Cataract  . [place]  . KNEE ARTHROSCOPY Right 2009  . KNEE ARTHROSCOPY Left 2011  . neck surgery  1984    Family History  Problem Relation Age of Onset  . Coronary artery  disease Father   . Heart attack Father   . Hypertension Father   . Hyperlipidemia Father   . Cancer Father        prostate  . Diabetes Neg Hx   . Colon cancer Neg Hx     Social History   Tobacco Use  . Smoking status: Former Smoker    Packs/day: 2.00    Years: 35.00    Pack years: 70.00    Types: Cigarettes    Last attempt to quit: 02/10/2004    Years since quitting: 14.2  . Smokeless tobacco: Never Used  Substance Use Topics  . Alcohol use: No    Comment: quit 2004  . Drug use: No    Current Outpatient Medications  Medication Sig Dispense Refill  . alfuzosin (UROXATRAL) 10 MG 24 hr tablet Take by mouth at bedtime.    . Alprostadil, Vasodilator, (CAVERJECT) 20 MCG SOLR INJECT 1 DOSE DAILY AS  NEEDED AS DIRECTED (DISCARD UNUSED PORTION AFTER FIRST  USE) 18 each 2  . aspirin 81 MG tablet Take 81 mg by mouth daily.      [name] atorvastatin (LIPITOR) 40 MG tablet Take 40 mg by mouth daily.     . B-D TB SYRINGE 1CC/27GX1/2" 27G X 1/2" 1 ML MISC USE TO ADMINISTER CAVERJECT INJECTION, 3 USES PER WEEK  WITH AT LEAST 24 HOURS IN  BETWEEN EACH DOSE. 50 each 3  . baclofen (LIORESAL) 10 MG tablet Take 1 tablet (10 mg total) by mouth 2 (two) times daily as needed. (Patient taking differently: Take 10 mg by mouth 3 (three) times daily as needed for muscle spasms. ) 90 each 0  . celecoxib (CELEBREX) 200 MG capsule TAKE 1 CAPSULE BY MOUTH TWO TIMES DAILY 180 capsule 1  . cetirizine-pseudoephedrine (ZYRTEC-D ALLERGY & CONGESTION) 5-120 MG tablet Take 1 tablet by mouth daily as needed.     . docusate sodium (COLACE) 100 MG capsule Take 100 mg by mouth at bedtime as needed for mild constipation.    [name] emtricitabine-rilpivir-tenofovir AF (ODEFSEY) 200-25-25 MG TABS tablet Take 1 tablet by mouth daily.    [name] gabapentin (NEURONTIN) 100 MG capsule Take 1 capsule (100 mg total) by mouth 3 (three) times daily. (Patient taking differently: Take 100 mg by mouth See admin instructions. TAKE 1 CAPSULE (100 MG) BY  MOUTH 2-3 TIMES DAILY) 42 capsule 0  . hydrochlorothiazide (HYDRODIURIL) 50 MG tablet TAKE 1 TABLET BY MOUTH  DAILY 90 tablet 2  . hydrocortisone (ANUCORT-HC) 25 MG suppository UNWRAP AND INSERT 1  SUPPOSITORY RECTALLY AT  BEDTIME 96 suppository 1  . Melatonin 10 MG TABS Take 10 mg by mouth at bedtime as needed (FOR SLEEP.).    [name] mirabegron ER (MYRBETRIQ) 25 MG TB24 tablet Take 25 mg by mouth at bedtime.     . montelukast (SINGULAIR) 10 MG tablet TAKE 1 TABLET BY MOUTH  DAILY 90 tablet 2  . Multiple Vitamin (MULTIVITAMIN WITH MINERALS) TABS tablet Take 1 tablet by mouth daily. COMPLETE MULTIVITAMIN MEN'S 50+    . polyethylene glycol powder (GLYCOLAX/MIRALAX) powder Take 17 g by mouth daily as needed (FOR CONSTIPATION.).    [name] RESTASIS 0.05 % ophthalmic emulsion Place 1 drop into both eyes 2 (two) times daily.    . tadalafil (CIALIS) 20 MG tablet Take 20 mg by mouth daily.    [name] testosterone enanthate (DELATESTRYL) 200 MG/ML injection INJECT 1 ML INTRAMUSCULARLY EVERY 14 DAYS . DISCARD 28 DAYS AFTER INITIAL USE. 5 mL 3   No current facility-administered medications for this visit.     No Known Allergies  Review of Systems:  Negative except for HPI.    Physical Exam:    BP 108/60   Pulse 67   Ht 5\' 10"  (1.778 m)   Wt 142 lb 2 oz (64.5 kg)   BMI 20.39 kg/m  Filed Weights   05/09/18 1110  Weight: 142 lb 2 oz (64.5 kg)   Constitutional:  Well-developed, in no acute distress. Psychiatric: Normal mood and affect. Behavior is normal. HEENT: Pupils normal.  Conjunctivae are normal. No scleral icterus. Neck supple.  Cardiovascular: Normal rate, regular rhythm. No edema Pulmonary/chest: Effort normal and breath sounds normal. No wheezing, rales or rhonchi. Abdominal: Soft, nondistended. Nontender. Bowel sounds active throughout. There are no masses palpable. No hepatomegaly.  Has right inguinal hernia. Rectal:  defered Neurological: Alert and oriented to person place and time. Skin: Skin  is warm and dry. No rashes noted.  Data Reviewed: I have personally reviewed following labs and imaging studies  CBC: CBC Latest Ref Rng & Units 04/24/2018 11/13/2017 04/24/2017  WBC 4.0 - 10.5 K/uL 4.8 4.8 5.5  Hemoglobin 13.0 - 17.0 g/dL 17.2(H) 17.8(H) 16.1  Hematocrit 39.0 - 52.0 % 48.6 50.6 46.1  Platelets 150.0 - 400.0 K/uL 189.0 147(L) 151.0    CMP: CMP Latest Ref Rng & Units 04/24/2018 11/13/2017 04/24/2017  Glucose 70 - 99 mg/dL 96 60(L) 94  BUN 6 - 23 mg/dL 13 7 12   Creatinine 0.40 - 1.50 mg/dL 1.21 1.35(H) 1.15  Sodium 135 - 145 mEq/L 135 136 131(L)  Potassium 3.5 - 5.1 mEq/L 3.8 3.4(L) 3.3(L)  Chloride 96 - 112 mEq/L 94(L) 94(L) 91(L)  CO2 19 - 32 mEq/L 35(H) 33(H) 34(H)  Calcium 8.4 - 10.5 mg/dL 9.4 9.0 9.0  Total Protein 6.0 - 8.3 g/dL 6.1 6.2(L) 6.0  Total Bilirubin 0.2 - 1.2 mg/dL 0.8 0.8 0.8  Alkaline Phos 39 - 117 U/L 52 48 41  AST 0 - 37 U/L 19 26 20   ALT 0 - 53 U/L 15 24 17   I spent 15 minutes of face-to-face time with the patient. Greater than 50% of the time was spent counseling and coordinating care.     Carmell Austria, MD 05/09/2018, 11:22 AM  Cc: Plotnikov, Evie Lacks, MD

## 2018-05-10 ENCOUNTER — Other Ambulatory Visit: Payer: Self-pay | Admitting: Internal Medicine

## 2018-05-15 ENCOUNTER — Other Ambulatory Visit: Payer: Self-pay | Admitting: Orthopedic Surgery

## 2018-05-16 ENCOUNTER — Telehealth: Payer: Self-pay | Admitting: Gastroenterology

## 2018-05-16 MED ORDER — LINACLOTIDE 72 MCG PO CAPS: 72.0000 ug | ORAL_CAPSULE | Freq: Every day | ORAL | 3 refills | Status: DC

## 2018-05-16 NOTE — Telephone Encounter (Signed)
Patient states medication linzess is working and would like a 90 day supply sent to [facility].

## 2018-05-16 NOTE — Telephone Encounter (Signed)
Sent refills to patients pharmacy.  

## 2018-05-17 ENCOUNTER — Ambulatory Visit
Admission: RE | Admit: 2018-05-17 | Discharge: 2018-05-17 | Disposition: A | Discharge status: Home / Self Care | Payer: Medicare Other | Source: Ambulatory Visit | Attending: Orthopedic Surgery | Admitting: Orthopedic Surgery

## 2018-05-17 DIAGNOSIS — M5136 Other intervertebral disc degeneration, lumbar region: Secondary | ICD-10-CM

## 2018-05-17 MED ORDER — METHYLPREDNISOLONE ACETATE 40 MG/ML INJ SUSP (RADIOLOG
120.0000 mg | Freq: Once | INTRAMUSCULAR | Status: AC
  Administered 2018-05-17: 120 mg via EPIDURAL

## 2018-05-17 MED ORDER — IOPAMIDOL (ISOVUE-M 200) INJECTION 41%
1.0000 mL | Freq: Once | INTRAMUSCULAR | Status: AC
  Administered 2018-05-17: 1 mL via EPIDURAL

## 2018-05-28 ENCOUNTER — Encounter (HOSPITAL_COMMUNITY): Payer: Self-pay | Admitting: *Deleted

## 2018-05-28 ENCOUNTER — Other Ambulatory Visit: Payer: Self-pay

## 2018-05-28 NOTE — Progress Notes (Signed)
Spoke with pt for pre-op call. Pt denies cardiac history and diabetes. Last dose of Aspirin was 05/21/18.

## 2018-05-29 ENCOUNTER — Ambulatory Visit (HOSPITAL_COMMUNITY): Payer: Medicare Other

## 2018-05-29 ENCOUNTER — Encounter (HOSPITAL_COMMUNITY): Payer: Self-pay | Admitting: General Practice

## 2018-05-29 ENCOUNTER — Inpatient Hospital Stay (HOSPITAL_COMMUNITY)
Admission: AD | Admit: 2018-05-29 | Discharge: 2018-05-30 | DRG: 473 | Disposition: A | Discharge status: Home / Self Care | Payer: Medicare Other | Attending: Orthopedic Surgery | Admitting: Orthopedic Surgery

## 2018-05-29 ENCOUNTER — Ambulatory Visit (HOSPITAL_COMMUNITY): Payer: Medicare Other | Admitting: Anesthesiology

## 2018-05-29 ENCOUNTER — Other Ambulatory Visit: Payer: Self-pay

## 2018-05-29 ENCOUNTER — Encounter (HOSPITAL_COMMUNITY)
Admission: AD | Disposition: A | Discharge status: Home / Self Care | Payer: Self-pay | Source: Home / Self Care | Attending: Orthopedic Surgery

## 2018-05-29 DIAGNOSIS — I10 Essential (primary) hypertension: Secondary | ICD-10-CM | POA: Diagnosis present

## 2018-05-29 DIAGNOSIS — Z85828 Personal history of other malignant neoplasm of skin: Secondary | ICD-10-CM | POA: Diagnosis not present

## 2018-05-29 DIAGNOSIS — Z79899 Other long term (current) drug therapy: Secondary | ICD-10-CM | POA: Diagnosis not present

## 2018-05-29 DIAGNOSIS — Z21 Asymptomatic human immunodeficiency virus [HIV] infection status: Secondary | ICD-10-CM | POA: Diagnosis present

## 2018-05-29 DIAGNOSIS — Z7982 Long term (current) use of aspirin: Secondary | ICD-10-CM

## 2018-05-29 DIAGNOSIS — M5410 Radiculopathy, site unspecified: Secondary | ICD-10-CM | POA: Diagnosis present

## 2018-05-29 DIAGNOSIS — M960 Pseudarthrosis after fusion or arthrodesis: Principal | ICD-10-CM | POA: Diagnosis present

## 2018-05-29 DIAGNOSIS — M5412 Radiculopathy, cervical region: Secondary | ICD-10-CM | POA: Diagnosis present

## 2018-05-29 DIAGNOSIS — Z87891 Personal history of nicotine dependence: Secondary | ICD-10-CM | POA: Diagnosis not present

## 2018-05-29 DIAGNOSIS — Y848 Other medical procedures as the cause of abnormal reaction of the patient, or of later complication, without mention of misadventure at the time of the procedure: Secondary | ICD-10-CM | POA: Diagnosis present

## 2018-05-29 DIAGNOSIS — M4802 Spinal stenosis, cervical region: Secondary | ICD-10-CM | POA: Diagnosis present

## 2018-05-29 DIAGNOSIS — N400 Enlarged prostate without lower urinary tract symptoms: Secondary | ICD-10-CM | POA: Diagnosis present

## 2018-05-29 DIAGNOSIS — Z419 Encounter for procedure for purposes other than remedying health state, unspecified: Secondary | ICD-10-CM

## 2018-05-29 HISTORY — PX: POSTERIOR CERVICAL FUSION/FORAMINOTOMY: SHX5038

## 2018-05-29 LAB — TYPE AND SCREEN
ABO/RH(D): A POS
Antibody Screen: NEGATIVE

## 2018-05-29 LAB — PROTIME-INR
INR: 1
Prothrombin Time: 13.1 seconds (ref 11.4–15.2)

## 2018-05-29 LAB — CBC WITH DIFFERENTIAL/PLATELET
Abs Immature Granulocytes: 0.02 10*3/uL (ref 0.00–0.07)
Basophils Absolute: 0 10*3/uL (ref 0.0–0.1)
Basophils Relative: 1 %
EOS ABS: 0.1 10*3/uL (ref 0.0–0.5)
Eosinophils Relative: 1 %
HEMATOCRIT: 48.2 % (ref 39.0–52.0)
Hemoglobin: 16.8 g/dL (ref 13.0–17.0)
Immature Granulocytes: 0 %
LYMPHS ABS: 1 10*3/uL (ref 0.7–4.0)
LYMPHS PCT: 19 %
MCH: 33.3 pg (ref 26.0–34.0)
MCHC: 34.9 g/dL (ref 30.0–36.0)
MCV: 95.4 fL (ref 80.0–100.0)
MONOS PCT: 9 %
Monocytes Absolute: 0.5 10*3/uL (ref 0.1–1.0)
NEUTROS PCT: 70 %
Neutro Abs: 3.5 10*3/uL (ref 1.7–7.7)
Platelets: 156 10*3/uL (ref 150–400)
RBC: 5.05 MIL/uL (ref 4.22–5.81)
RDW: 12.4 % (ref 11.5–15.5)
WBC: 5.1 10*3/uL (ref 4.0–10.5)
nRBC: 0 % (ref 0.0–0.2)

## 2018-05-29 LAB — COMPREHENSIVE METABOLIC PANEL
ALBUMIN: 3.5 g/dL (ref 3.5–5.0)
ALK PHOS: 48 U/L (ref 38–126)
ALT: 19 U/L (ref 0–44)
AST: 20 U/L (ref 15–41)
Anion gap: 5 (ref 5–15)
BUN: 10 mg/dL (ref 8–23)
CALCIUM: 9 mg/dL (ref 8.9–10.3)
CHLORIDE: 97 mmol/L — AB (ref 98–111)
CO2: 34 mmol/L — AB (ref 22–32)
CREATININE: 1.27 mg/dL — AB (ref 0.61–1.24)
GFR calc non Af Amer: 56 mL/min — ABNORMAL LOW (ref >60)
GLUCOSE: 96 mg/dL (ref 70–99)
Potassium: 3.2 mmol/L — ABNORMAL LOW (ref 3.5–5.1)
SODIUM: 136 mmol/L (ref 135–145)
Total Bilirubin: 1 mg/dL (ref 0.3–1.2)
Total Protein: 5.7 g/dL — ABNORMAL LOW (ref 6.5–8.1)

## 2018-05-29 LAB — URINALYSIS, ROUTINE W REFLEX MICROSCOPIC
BILIRUBIN URINE: NEGATIVE
Glucose, UA: NEGATIVE mg/dL
HGB URINE DIPSTICK: NEGATIVE
Ketones, ur: NEGATIVE mg/dL
Leukocytes, UA: NEGATIVE
NITRITE: NEGATIVE
PROTEIN: NEGATIVE mg/dL
Specific Gravity, Urine: 1.013 (ref 1.005–1.030)
pH: 7 (ref 5.0–8.0)

## 2018-05-29 LAB — APTT: aPTT: 30 seconds (ref 24–36)

## 2018-05-29 SURGERY — POSTERIOR CERVICAL FUSION/FORAMINOTOMY LEVEL 4
Anesthesia: General

## 2018-05-29 MED ORDER — MIDAZOLAM HCL 2 MG/2ML IJ SOLN
INTRAMUSCULAR | Status: AC
  Filled 2018-05-29: qty 2

## 2018-05-29 MED ORDER — SUGAMMADEX SODIUM 200 MG/2ML IV SOLN
INTRAVENOUS | Status: DC | PRN
  Administered 2018-05-29: 150 mg via INTRAVENOUS

## 2018-05-29 MED ORDER — LIDOCAINE 2% (20 MG/ML) 5 ML SYRINGE
INTRAMUSCULAR | Status: AC
  Filled 2018-05-29: qty 5

## 2018-05-29 MED ORDER — BISACODYL 5 MG PO TBEC: 5.0000 mg | DELAYED_RELEASE_TABLET | Freq: Every day | ORAL | Status: DC | PRN

## 2018-05-29 MED ORDER — CYCLOSPORINE 0.05 % OP EMUL
1.0000 [drp] | Freq: Two times a day (BID) | OPHTHALMIC | Status: DC
  Administered 2018-05-29 – 2018-05-30 (×3): 1 [drp] via OPHTHALMIC
  Filled 2018-05-29 (×3): qty 1

## 2018-05-29 MED ORDER — SUCCINYLCHOLINE CHLORIDE 20 MG/ML IJ SOLN
INTRAMUSCULAR | Status: DC | PRN
  Administered 2018-05-29: 120 mg via INTRAVENOUS

## 2018-05-29 MED ORDER — ONDANSETRON HCL 4 MG PO TABS: 4.0000 mg | ORAL_TABLET | Freq: Four times a day (QID) | ORAL | Status: DC | PRN

## 2018-05-29 MED ORDER — SODIUM CHLORIDE 0.9 % IV SOLN: 250.0000 mL | INTRAVENOUS | Status: DC

## 2018-05-29 MED ORDER — TESTOSTERONE ENANTHATE 200 MG/ML IM SOLN: 200.0000 mg | INTRAMUSCULAR | Status: DC

## 2018-05-29 MED ORDER — DOCUSATE SODIUM 100 MG PO CAPS
100.0000 mg | ORAL_CAPSULE | Freq: Two times a day (BID) | ORAL | Status: DC
  Administered 2018-05-29 – 2018-05-30 (×2): 100 mg via ORAL
  Filled 2018-05-29 (×2): qty 1

## 2018-05-29 MED ORDER — GABAPENTIN 100 MG PO CAPS
100.0000 mg | ORAL_CAPSULE | Freq: Two times a day (BID) | ORAL | Status: DC
  Administered 2018-05-29 – 2018-05-30 (×2): 100 mg via ORAL
  Filled 2018-05-29 (×2): qty 1

## 2018-05-29 MED ORDER — PHENYLEPHRINE HCL 10 MG/ML IJ SOLN
INTRAMUSCULAR | Status: DC | PRN
  Administered 2018-05-29: 80 ug via INTRAVENOUS
  Administered 2018-05-29: 40 ug via INTRAVENOUS
  Administered 2018-05-29: 80 ug via INTRAVENOUS
  Administered 2018-05-29 (×4): 40 ug via INTRAVENOUS
  Administered 2018-05-29: 120 ug via INTRAVENOUS
  Administered 2018-05-29: 40 ug via INTRAVENOUS

## 2018-05-29 MED ORDER — EPHEDRINE SULFATE 50 MG/ML IJ SOLN
INTRAMUSCULAR | Status: DC | PRN
  Administered 2018-05-29 (×5): 10 mg via INTRAVENOUS

## 2018-05-29 MED ORDER — THROMBIN (RECOMBINANT) 20000 UNITS EX SOLR
CUTANEOUS | Status: AC
  Filled 2018-05-29: qty 20000

## 2018-05-29 MED ORDER — ROCURONIUM BROMIDE 50 MG/5ML IV SOSY
PREFILLED_SYRINGE | INTRAVENOUS | Status: AC
  Filled 2018-05-29: qty 5

## 2018-05-29 MED ORDER — BUPIVACAINE LIPOSOME 1.3 % IJ SUSP
INTRAMUSCULAR | Status: DC | PRN
  Administered 2018-05-29: 20 mL

## 2018-05-29 MED ORDER — ALBUMIN HUMAN 5 % IV SOLN
INTRAVENOUS | Status: DC | PRN
  Administered 2018-05-29 (×2): via INTRAVENOUS

## 2018-05-29 MED ORDER — LIDOCAINE 2% (20 MG/ML) 5 ML SYRINGE
INTRAMUSCULAR | Status: DC | PRN
  Administered 2018-05-29: 100 mg via INTRAVENOUS

## 2018-05-29 MED ORDER — SENNOSIDES-DOCUSATE SODIUM 8.6-50 MG PO TABS
1.0000 | ORAL_TABLET | Freq: Every evening | ORAL | Status: DC | PRN
  Filled 2018-05-29: qty 1

## 2018-05-29 MED ORDER — PROMETHAZINE HCL 25 MG/ML IJ SOLN: 6.2500 mg | INTRAMUSCULAR | Status: DC | PRN

## 2018-05-29 MED ORDER — ADULT MULTIVITAMIN W/MINERALS CH
1.0000 | ORAL_TABLET | Freq: Every day | ORAL | Status: DC
  Administered 2018-05-29 – 2018-05-30 (×2): 1 via ORAL
  Filled 2018-05-29 (×2): qty 1

## 2018-05-29 MED ORDER — DIAZEPAM 5 MG PO TABS
5.0000 mg | ORAL_TABLET | Freq: Four times a day (QID) | ORAL | Status: DC | PRN
  Administered 2018-05-29 – 2018-05-30 (×3): 5 mg via ORAL
  Filled 2018-05-29 (×3): qty 1

## 2018-05-29 MED ORDER — ALUM & MAG HYDROXIDE-SIMETH 200-200-20 MG/5ML PO SUSP: 30.0000 mL | Freq: Four times a day (QID) | ORAL | Status: DC | PRN

## 2018-05-29 MED ORDER — HYDROMORPHONE HCL 1 MG/ML IJ SOLN: 0.2500 mg | INTRAMUSCULAR | Status: DC | PRN

## 2018-05-29 MED ORDER — PROPOFOL 10 MG/ML IV BOLUS
INTRAVENOUS | Status: DC | PRN
  Administered 2018-05-29: 120 mg via INTRAVENOUS

## 2018-05-29 MED ORDER — LORATADINE 10 MG PO TABS
10.0000 mg | ORAL_TABLET | Freq: Every day | ORAL | Status: DC
  Administered 2018-05-29 – 2018-05-30 (×2): 10 mg via ORAL
  Filled 2018-05-29 (×2): qty 1

## 2018-05-29 MED ORDER — EMTRICITAB-RILPIVIR-TENOFOV AF 200-25-25 MG PO TABS
1.0000 | ORAL_TABLET | Freq: Every day | ORAL | Status: DC
  Administered 2018-05-29 – 2018-05-30 (×2): 1 via ORAL
  Filled 2018-05-29 (×2): qty 1

## 2018-05-29 MED ORDER — ONDANSETRON HCL 4 MG/2ML IJ SOLN
INTRAMUSCULAR | Status: AC
  Filled 2018-05-29: qty 2

## 2018-05-29 MED ORDER — BUPIVACAINE-EPINEPHRINE (PF) 0.25% -1:200000 IJ SOLN
INTRAMUSCULAR | Status: AC
  Filled 2018-05-29: qty 30

## 2018-05-29 MED ORDER — FLEET ENEMA 7-19 GM/118ML RE ENEM: 1.0000 | ENEMA | Freq: Once | RECTAL | Status: DC | PRN

## 2018-05-29 MED ORDER — PROPOFOL 10 MG/ML IV BOLUS
INTRAVENOUS | Status: AC
  Filled 2018-05-29: qty 40

## 2018-05-29 MED ORDER — MIDAZOLAM HCL 5 MG/5ML IJ SOLN
INTRAMUSCULAR | Status: DC | PRN
  Administered 2018-05-29: 2 mg via INTRAVENOUS

## 2018-05-29 MED ORDER — MENTHOL 3 MG MT LOZG: 1.0000 | LOZENGE | OROMUCOSAL | Status: DC | PRN

## 2018-05-29 MED ORDER — PHENOL 1.4 % MT LIQD: 1.0000 | OROMUCOSAL | Status: DC | PRN

## 2018-05-29 MED ORDER — ACETAMINOPHEN 325 MG PO TABS: 650.0000 mg | ORAL_TABLET | ORAL | Status: DC | PRN

## 2018-05-29 MED ORDER — OXYCODONE-ACETAMINOPHEN 5-325 MG PO TABS
1.0000 | ORAL_TABLET | ORAL | Status: DC | PRN
  Administered 2018-05-29 – 2018-05-30 (×5): 2 via ORAL
  Filled 2018-05-29 (×5): qty 2

## 2018-05-29 MED ORDER — POLYETHYLENE GLYCOL 3350 17 GM/SCOOP PO POWD
17.0000 g | Freq: Every day | ORAL | Status: DC | PRN
  Filled 2018-05-29: qty 255

## 2018-05-29 MED ORDER — OXYCODONE HCL 5 MG/5ML PO SOLN: 5.0000 mg | Freq: Once | ORAL | Status: DC | PRN

## 2018-05-29 MED ORDER — EPHEDRINE 5 MG/ML INJ
INTRAVENOUS | Status: AC
  Filled 2018-05-29: qty 10

## 2018-05-29 MED ORDER — SODIUM CHLORIDE 0.9% FLUSH: 3.0000 mL | INTRAVENOUS | Status: DC | PRN

## 2018-05-29 MED ORDER — BACITRACIN ZINC 500 UNIT/GM EX OINT
TOPICAL_OINTMENT | CUTANEOUS | Status: AC
  Filled 2018-05-29: qty 28.35

## 2018-05-29 MED ORDER — ONDANSETRON HCL 4 MG/2ML IJ SOLN: 4.0000 mg | Freq: Four times a day (QID) | INTRAMUSCULAR | Status: DC | PRN

## 2018-05-29 MED ORDER — OXYCODONE HCL 5 MG PO TABS: 5.0000 mg | ORAL_TABLET | Freq: Once | ORAL | Status: DC | PRN

## 2018-05-29 MED ORDER — POLYETHYLENE GLYCOL 3350 17 G PO PACK: 17.0000 g | PACK | Freq: Every day | ORAL | Status: DC | PRN

## 2018-05-29 MED ORDER — HYDROCORTISONE ACETATE 25 MG RE SUPP
25.0000 mg | Freq: Every day | RECTAL | Status: DC
  Filled 2018-05-29: qty 1

## 2018-05-29 MED ORDER — OXYCODONE-ACETAMINOPHEN 5-325 MG PO TABS
ORAL_TABLET | ORAL | Status: AC
  Filled 2018-05-29: qty 2

## 2018-05-29 MED ORDER — PHENYLEPHRINE 40 MCG/ML (10ML) SYRINGE FOR IV PUSH (FOR BLOOD PRESSURE SUPPORT)
PREFILLED_SYRINGE | INTRAVENOUS | Status: AC
  Filled 2018-05-29: qty 10

## 2018-05-29 MED ORDER — POTASSIUM CHLORIDE CRYS ER 10 MEQ PO TBCR
10.0000 meq | EXTENDED_RELEASE_TABLET | Freq: Every day | ORAL | Status: DC
  Administered 2018-05-29 – 2018-05-30 (×2): 10 meq via ORAL
  Filled 2018-05-29 (×2): qty 1

## 2018-05-29 MED ORDER — POVIDONE-IODINE 7.5 % EX SOLN
Freq: Once | CUTANEOUS | Status: DC
  Filled 2018-05-29: qty 118

## 2018-05-29 MED ORDER — SUGAMMADEX SODIUM 200 MG/2ML IV SOLN
INTRAVENOUS | Status: AC
  Filled 2018-05-29: qty 2

## 2018-05-29 MED ORDER — LINACLOTIDE 72 MCG PO CAPS
72.0000 ug | ORAL_CAPSULE | Freq: Every day | ORAL | Status: DC
  Administered 2018-05-30: 72 ug via ORAL
  Filled 2018-05-29: qty 1

## 2018-05-29 MED ORDER — SODIUM CHLORIDE 0.9 % IV SOLN
INTRAVENOUS | Status: DC | PRN
  Administered 2018-05-29: 25 ug/min via INTRAVENOUS

## 2018-05-29 MED ORDER — DIAZEPAM 5 MG PO TABS
ORAL_TABLET | ORAL | Status: AC
  Filled 2018-05-29: qty 1

## 2018-05-29 MED ORDER — FENTANYL CITRATE (PF) 100 MCG/2ML IJ SOLN
INTRAMUSCULAR | Status: DC | PRN
  Administered 2018-05-29 (×5): 50 ug via INTRAVENOUS

## 2018-05-29 MED ORDER — MELATONIN 3 MG PO TABS
9.0000 mg | ORAL_TABLET | Freq: Every evening | ORAL | Status: DC | PRN
  Filled 2018-05-29: qty 3

## 2018-05-29 MED ORDER — LACTATED RINGERS IV SOLN
INTRAVENOUS | Status: DC | PRN
  Administered 2018-05-29 (×2): via INTRAVENOUS

## 2018-05-29 MED ORDER — BUPIVACAINE LIPOSOME 1.3 % IJ SUSP
20.0000 mL | Freq: Once | INTRAMUSCULAR | Status: DC
  Filled 2018-05-29: qty 20

## 2018-05-29 MED ORDER — HYDROCHLOROTHIAZIDE 25 MG PO TABS
50.0000 mg | ORAL_TABLET | Freq: Every day | ORAL | Status: DC
  Administered 2018-05-29 – 2018-05-30 (×2): 50 mg via ORAL
  Filled 2018-05-29 (×2): qty 2

## 2018-05-29 MED ORDER — ALFUZOSIN HCL ER 10 MG PO TB24
10.0000 mg | ORAL_TABLET | Freq: Every day | ORAL | Status: DC
  Administered 2018-05-29: 10 mg via ORAL
  Filled 2018-05-29: qty 1

## 2018-05-29 MED ORDER — MIRABEGRON ER 25 MG PO TB24
25.0000 mg | ORAL_TABLET | Freq: Every day | ORAL | Status: DC
  Administered 2018-05-29: 25 mg via ORAL
  Filled 2018-05-29: qty 1

## 2018-05-29 MED ORDER — FENTANYL CITRATE (PF) 250 MCG/5ML IJ SOLN
INTRAMUSCULAR | Status: AC
  Filled 2018-05-29: qty 5

## 2018-05-29 MED ORDER — CEFAZOLIN SODIUM-DEXTROSE 2-4 GM/100ML-% IV SOLN
2.0000 g | INTRAVENOUS | Status: AC
  Administered 2018-05-29: 2 g via INTRAVENOUS

## 2018-05-29 MED ORDER — ACETAMINOPHEN 650 MG RE SUPP: 650.0000 mg | RECTAL | Status: DC | PRN

## 2018-05-29 MED ORDER — SUCCINYLCHOLINE CHLORIDE 200 MG/10ML IV SOSY
PREFILLED_SYRINGE | INTRAVENOUS | Status: AC
  Filled 2018-05-29: qty 10

## 2018-05-29 MED ORDER — ONDANSETRON HCL 4 MG/2ML IJ SOLN
INTRAMUSCULAR | Status: DC | PRN
  Administered 2018-05-29: 4 mg via INTRAVENOUS

## 2018-05-29 MED ORDER — THROMBIN 20000 UNITS EX SOLR
CUTANEOUS | Status: DC | PRN
  Administered 2018-05-29: 20000 [IU] via TOPICAL

## 2018-05-29 MED ORDER — CEFAZOLIN SODIUM-DEXTROSE 2-4 GM/100ML-% IV SOLN
2.0000 g | Freq: Three times a day (TID) | INTRAVENOUS | Status: AC
  Administered 2018-05-29 (×2): 2 g via INTRAVENOUS
  Filled 2018-05-29 (×2): qty 100

## 2018-05-29 MED ORDER — SODIUM CHLORIDE 0.9% FLUSH: 3.0000 mL | Freq: Two times a day (BID) | INTRAVENOUS | Status: DC

## 2018-05-29 MED ORDER — CEFAZOLIN SODIUM-DEXTROSE 2-4 GM/100ML-% IV SOLN
INTRAVENOUS | Status: AC
  Filled 2018-05-29: qty 100

## 2018-05-29 MED ORDER — HEMOSTATIC AGENTS (NO CHARGE) OPTIME
TOPICAL | Status: DC | PRN
  Administered 2018-05-29: 1 via TOPICAL

## 2018-05-29 MED ORDER — PANTOPRAZOLE SODIUM 40 MG IV SOLR
40.0000 mg | Freq: Every day | INTRAVENOUS | Status: DC
  Administered 2018-05-29: 40 mg via INTRAVENOUS
  Filled 2018-05-29: qty 40

## 2018-05-29 MED ORDER — ROCURONIUM BROMIDE 50 MG/5ML IV SOSY
PREFILLED_SYRINGE | INTRAVENOUS | Status: DC | PRN
  Administered 2018-05-29: 20 mg via INTRAVENOUS
  Administered 2018-05-29: 50 mg via INTRAVENOUS
  Administered 2018-05-29: 30 mg via INTRAVENOUS

## 2018-05-29 MED ORDER — ATORVASTATIN CALCIUM 20 MG PO TABS
40.0000 mg | ORAL_TABLET | Freq: Every day | ORAL | Status: DC
  Administered 2018-05-29: 40 mg via ORAL
  Filled 2018-05-29: qty 2

## 2018-05-29 MED ORDER — 0.9 % SODIUM CHLORIDE (POUR BTL) OPTIME
TOPICAL | Status: DC | PRN
  Administered 2018-05-29: 1000 mL

## 2018-05-29 MED ORDER — BACITRACIN ZINC 500 UNIT/GM EX OINT
TOPICAL_OINTMENT | CUTANEOUS | Status: DC | PRN
  Administered 2018-05-29: 1 via TOPICAL

## 2018-05-29 MED ORDER — MONTELUKAST SODIUM 10 MG PO TABS
10.0000 mg | ORAL_TABLET | Freq: Every day | ORAL | Status: DC
  Administered 2018-05-30: 10 mg via ORAL
  Filled 2018-05-29 (×2): qty 1

## 2018-05-29 MED ORDER — BUPIVACAINE-EPINEPHRINE 0.25% -1:200000 IJ SOLN
INTRAMUSCULAR | Status: DC | PRN
  Administered 2018-05-29: 10 mL
  Administered 2018-05-29: 20 mL

## 2018-05-29 SURGICAL SUPPLY — 84 items
BENZOIN TINCTURE PRP APPL 2/3 (GAUZE/BANDAGES/DRESSINGS) ×2 IMPLANT
BIT DRILL MOUNTAINEER FIX 14 (BIT) ×1
BIT DRILL MOUNTAINEER FIX 14MM (BIT) ×1 IMPLANT
BLADE CLIPPER SURG NEURO (BLADE) IMPLANT
BONE VIVIGEN FORMABLE 1.3CC (Bone Implant) ×2 IMPLANT
BUR NEURO DRILL SOFT 3.0X3.8M (BURR) ×2 IMPLANT
BUR PRESCISION 1.7 ELITE (BURR) ×2 IMPLANT
CONT SPEC 4OZ CLIKSEAL STRL BL (MISCELLANEOUS) IMPLANT
CORDS BIPOLAR (ELECTRODE) ×2 IMPLANT
COVER BACK TABLE 80X110 HD (DRAPES) IMPLANT
COVER SURGICAL LIGHT HANDLE (MISCELLANEOUS) ×4 IMPLANT
COVER WAND RF STERILE (DRAPES) ×2 IMPLANT
DRAIN CHANNEL 10F 3/8 F FF (DRAIN) IMPLANT
DRAIN CHANNEL 15F RND FF W/TCR (WOUND CARE) IMPLANT
DRAIN HEMOVAC 1/8 X 5 (WOUND CARE) IMPLANT
DRAPE C-ARM 42X72 X-RAY (DRAPES) ×2 IMPLANT
DRAPE HALF SHEET 40X57 (DRAPES) ×4 IMPLANT
DRAPE INCISE IOBAN 66X45 STRL (DRAPES) ×2 IMPLANT
DRAPE PED LAPAROTOMY (DRAPES) ×2 IMPLANT
DRAPE POUCH INSTRU U-SHP 10X18 (DRAPES) ×2 IMPLANT
DRAPE SURG 17X23 STRL (DRAPES) ×8 IMPLANT
DRAPE UNIVERSAL PACK (DRAPES) ×2 IMPLANT
DRILL BIT MOUNTAINEER FIX 14MM (BIT) ×1
DRSG MEPILEX BORDER 4X8 (GAUZE/BANDAGES/DRESSINGS) IMPLANT
DURAPREP 26ML APPLICATOR (WOUND CARE) ×2 IMPLANT
ELECT BLADE 4.0 EZ CLEAN MEGAD (MISCELLANEOUS) ×2
ELECT CAUTERY BLADE 6.4 (BLADE) ×2 IMPLANT
ELECT REM PT RETURN 9FT ADLT (ELECTROSURGICAL) ×2
ELECTRODE BLDE 4.0 EZ CLN MEGD (MISCELLANEOUS) ×1 IMPLANT
ELECTRODE REM PT RTRN 9FT ADLT (ELECTROSURGICAL) ×1 IMPLANT
EVACUATOR SILICONE 100CC (DRAIN) IMPLANT
GAUZE 4X4 16PLY RFD (DISPOSABLE) ×8 IMPLANT
GAUZE SPONGE 4X4 12PLY STRL (GAUZE/BANDAGES/DRESSINGS) ×2 IMPLANT
GAUZE SPONGE 4X4 12PLY STRL LF (GAUZE/BANDAGES/DRESSINGS) ×2 IMPLANT
GLOVE BIO SURGEON STRL SZ7 (GLOVE) ×2 IMPLANT
GLOVE BIO SURGEON STRL SZ8 (GLOVE) ×2 IMPLANT
GLOVE BIOGEL PI IND STRL 7.5 (GLOVE) ×1 IMPLANT
GLOVE BIOGEL PI IND STRL 8 (GLOVE) ×1 IMPLANT
GLOVE BIOGEL PI INDICATOR 7.5 (GLOVE) ×1
GLOVE BIOGEL PI INDICATOR 8 (GLOVE) ×1
GOWN STRL REUS W/ TWL LRG LVL3 (GOWN DISPOSABLE) ×4 IMPLANT
GOWN STRL REUS W/ TWL XL LVL3 (GOWN DISPOSABLE) ×1 IMPLANT
GOWN STRL REUS W/TWL LRG LVL3 (GOWN DISPOSABLE) ×4
GOWN STRL REUS W/TWL XL LVL3 (GOWN DISPOSABLE) ×1
IV CATH 14GX2 1/4 (CATHETERS) ×2 IMPLANT
KIT BASIN OR (CUSTOM PROCEDURE TRAY) ×2 IMPLANT
KIT TURNOVER KIT B (KITS) ×2 IMPLANT
NEEDLE HYPO 25GX1X1/2 BEV (NEEDLE) ×2 IMPLANT
NEEDLE PRECISIONGLIDE 27X1.5 (NEEDLE) ×2 IMPLANT
NS IRRIG 1000ML POUR BTL (IV SOLUTION) ×6 IMPLANT
PACK LAMINECTOMY ORTHO (CUSTOM PROCEDURE TRAY) ×2 IMPLANT
PAD ARMBOARD 7.5X6 YLW CONV (MISCELLANEOUS) ×4 IMPLANT
PATTIES SURGICAL .5 X.5 (GAUZE/BANDAGES/DRESSINGS) ×2 IMPLANT
PATTIES SURGICAL .5 X3 (DISPOSABLE) IMPLANT
PATTIES SURGICAL .5X1.5 (GAUZE/BANDAGES/DRESSINGS) ×2 IMPLANT
PIN MAYFIELD SKULL DISP (PIN) ×2 IMPLANT
PUTTY DBX 1CC (Putty) ×2 IMPLANT
PUTTY DBX 1CC DEPUY (Putty) ×1 IMPLANT
ROD MOUNTAINEER 120MM (Rod) ×2 IMPLANT
ROD TEMPLATE MOUNTAINEER 120MM (ROD) ×4 IMPLANT
SCREW F A 3.5X14 (Screw) ×8 IMPLANT
SCREW INNER (Screw) ×12 IMPLANT
SCREW MOUNTAINEER 4.0X26MM (Screw) ×4 IMPLANT
SPONGE INTESTINAL PEANUT (DISPOSABLE) ×2 IMPLANT
SPONGE SURGIFOAM ABS GEL 100 (HEMOSTASIS) ×2 IMPLANT
STRIP CLOSURE SKIN 1/2X4 (GAUZE/BANDAGES/DRESSINGS) ×2 IMPLANT
SURGIFLO W/THROMBIN 8M KIT (HEMOSTASIS) IMPLANT
SUT MNCRL AB 4-0 PS2 18 (SUTURE) ×2 IMPLANT
SUT VIC AB 0 CT1 18XCR BRD 8 (SUTURE) ×1 IMPLANT
SUT VIC AB 0 CT1 8-18 (SUTURE) ×1
SUT VIC AB 1 CT1 18XCR BRD 8 (SUTURE) ×2 IMPLANT
SUT VIC AB 1 CT1 8-18 (SUTURE) ×2
SUT VIC AB 2-0 CT2 18 VCP726D (SUTURE) ×2 IMPLANT
SYR BULB IRRIGATION 50ML (SYRINGE) ×2 IMPLANT
SYR CONTROL 10ML LL (SYRINGE) ×2 IMPLANT
TAP MOUNTAINEER 3MM (TAP) ×2 IMPLANT
TAP SURG 3.5 MOUNTAINEER (BIT) ×2 IMPLANT
TAPE CLOTH 4X10 WHT NS (GAUZE/BANDAGES/DRESSINGS) ×2 IMPLANT
TAPE CLOTH SURG 4X10 WHT LF (GAUZE/BANDAGES/DRESSINGS) ×2 IMPLANT
TOWEL OR 17X24 6PK STRL BLUE (TOWEL DISPOSABLE) ×2 IMPLANT
TOWEL OR 17X26 10 PK STRL BLUE (TOWEL DISPOSABLE) ×2 IMPLANT
TRAY FOLEY MTR SLVR 16FR STAT (SET/KITS/TRAYS/PACK) ×2 IMPLANT
WATER STERILE IRR 1000ML POUR (IV SOLUTION) ×2 IMPLANT
YANKAUER SUCT BULB TIP NO VENT (SUCTIONS) ×2 IMPLANT

## 2018-05-29 NOTE — Anesthesia Procedure Notes (Signed)
Procedure Name: Intubation Date/Time: 05/29/2018 8:43 AM Performed by: Scheryl Darter, CRNA Pre-anesthesia Checklist: Patient identified, Emergency Drugs available, Suction available and Patient being monitored Patient Re-evaluated:Patient Re-evaluated prior to induction Oxygen Delivery Method: Circle System Utilized Preoxygenation: Pre-oxygenation with 100% oxygen Induction Type: IV induction Ventilation: Mask ventilation without difficulty Laryngoscope Size: Miller and 2 Grade View: Grade I Tube type: Oral Tube size: 7.5 mm Number of attempts: 1 Airway Equipment and Method: Stylet and Oral airway Placement Confirmation: ETT inserted through vocal cords under direct vision,  positive ETCO2 and breath sounds checked- equal and bilateral Secured at: 23 cm Tube secured with: Tape Dental Injury: Teeth and Oropharynx as per pre-operative assessment

## 2018-05-29 NOTE — Transfer of Care (Signed)
  Immediate Anesthesia Transfer of Care Note  Patient: Troy Marsh  Procedure(s) Performed: POSTERIOR CERVICAL DECOMPRESSION FUSION CERVICAL 4 - CERVICAL 7 WITH INSTRUMENTATION AND ALLOGRAFT (N/A )  Patient Location: PACU  Anesthesia Type:General  Level of Consciousness: awake, alert , oriented and sedated  Airway & Oxygen Therapy: Patient Spontanous Breathing and Patient connected to nasal cannula oxygen  Post-op Assessment: Report given to RN, Post -op Vital signs reviewed and stable and Patient moving all extremities  Post vital signs: Reviewed and stable  Last Vitals:  Vitals Value Taken Time  BP 122/72 05/29/2018  1:34 PM  Temp    Pulse 63 05/29/2018  1:42 PM  Resp 16 05/29/2018  1:42 PM  SpO2 100 % 05/29/2018  1:42 PM  Vitals shown include unvalidated device data.  Last Pain:  Vitals:   05/29/18 0740  TempSrc:   PainSc: 4       Patients Stated Pain Goal: 2 (83/29/19 1660)  Complications: No apparent anesthesia complications

## 2018-05-29 NOTE — Op Note (Signed)
NAME: Troy Marsh  MEDICAL RECORD NO: [id number]  DATE OF BIRTH: December 31, 1950  FACILITY: [facility]  [facility] Velna Ochs, MD  OPERATIVE REPORT  DATE OF PROCEDURE:  05/29/2018  PREOPERATIVE DIAGNOSES: 1.  Status post previous C4-C7 anterior cervical diskectomy.  2.  Recurrent right C7 radiculopathy 3.  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  Status post previous C4-C7 anterior cervical diskectomy.  2.  Recurrent right C7 radiculopathy 3.  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  Posterior spinal fusion C4-C5, C5-C6, C6-7 2.  Placement of posterior segmental instrumentation C4-C7. 3.  Bilateral posterior laminotomy with partial facetectomy and foraminotomy, C6-7 4.  Use of local autograft. 5.  Use of morselized allograft-- Vivigen, DBX putty. 6.  Cranial tong application and removal. 7.  Intraoperative use of fluoroscopy.  SURGEON:  Phylliss Bob, MD  ASSISTANT:  Pricilla Holm, PA-C   ANESTHESIA:  General endotracheal anesthesia.  COMPLICATIONS:  None.  DISPOSITION:  Stable.  ESTIMATED BLOOD LOSS:  Minimal.  INDICATIONS FOR SURGERY:  Briefly, Troy Marsh is a very pleasant 67 year old male who is status post the surgery as noted above.  Patient did well initially after surgery.  However, he did have recurrent pain in his right arm and neck pain.  A postoperative CAT scan and an MRI did reveal recurrent neuroforaminal stenosis at C6-7, and the CAT scan was also notable for nonunion at C6-7.  I did discuss proceeding with the procedure reflected above.  The patient did wish to proceed.  He was fully aware of the risks and limitations of surgery, as outlined in my preoperative note.  Of note, as noted in the body of this dictation, motion was noted across the C5-6 interspace intraoperatively, consistent with a  nonunion also present at C5-6.   OPERATIVE DETAILS:  On 05/29/2018, the patient was brought to surgery and general endotracheal anesthesia was administered.  A Mayfield head holder was applied by me.  The patient was then rolled prone, and the Mayfield headholder was secured to the bed with the head positioned into the appropriate degree of lordosis.  The patient's arms were secured to her sides, and all bony prominences were padded.  The neck was then prepped and draped posteriorly in the usual fashion.  A timeout procedure was performed.  At this point, I did make a midline incision, in line with the patient's previous incision.  Of note, the patient was status post a previous posterior cervical decompression many years ago. The fascia was incised at the midline.  The paraspinal musculature was bluntly swept laterally, and the posterior elements of C4, C5, and C6, and C7 were identified and subperiosteally exposed.  The  facet joints to be fused were subperiosteally exposed as well.  Of note, I did use a rondure to grab hold of the spinous process of C6.  There is obvious motion across both the C5-6 and C6-7 intervertebral spaces, consistent with a nonunion at both levels. I did use a 1.7 mm high-speed burr to decorticate the facet joints at C4-C5, and C5-C6, and C6-7 bilaterally.  A 3 mm bur was used to decorticate the posterior elements across these levels as  well.  Using anatomic landmarks, I did use a 1.7 mm bur to prepare the entry point of the lateral mass screws at C4 and C5 bilaterally.  A 3 mm tap was used,  and 3.5 x 14 mm screws were secured into the lateral masses of C4, C5  bilaterally.  I then performed bilateral partial facetectomies and foraminotomies at C6-7.  On the right side, I was very thorough with the partial facetectomy, and did perform a thorough foraminotomy, and did thoroughly and entirely decompress the exiting right C7 nerve.  The nerve was noted to be free of any compression well  lateral to the lateral aspect of the C7 pedicle.  At this point, using anatomic landmarks, I did cannulate the C7 pedicles bilaterally, using an awl, followed by 3.5 mm tap.  At this point, DBX putty was packed into the facet joints at C4-5, C5-6, and C6-7 bilaterally. Vivigen was also packed along the posterior elements.  At this point, 4 x 26 mm screws were placed into the pedicles of C7 bilaterally.  A rod was secured into the tulip heads of the screws on the right and left sides, spanning C4-C7.  After then placed in a final locking procedure was performed.  I was very pleased with the placement of the screws and the final construct.  Final AP and lateral fluoroscopic images were obtained, and I was very happy with the appearance. Prior to placing the bone graft, I did liberally irrigate the wound with a total of approximately 2 L of normal saline. At this point, the wound was closed in layers using #1 Vicryl, followed by 2-0 Vicryl, followed by 4-0 Monocryl.  Benzoin and Steri-Strips were applied, followed by sterile dressing.  All instrument counts were correct at the termination of the procedure.  Of note, Pricilla Holm was my assistant throughout surgery and did aid in retraction, suctioning, and closure from start to finish.  Phylliss Bob, MD

## 2018-05-29 NOTE — Anesthesia Preprocedure Evaluation (Signed)
Anesthesia Evaluation  Patient identified by MRN, date of birth, ID band Patient awake    Reviewed: Allergy & Precautions, H&P , NPO status , Patient's Chart, lab work & pertinent test results, reviewed documented beta blocker date and time   Airway Mallampati: II  TM Distance: >3 FB Neck ROM: full    Dental no notable dental hx.    Pulmonary neg pulmonary ROS, former smoker,    Pulmonary exam normal breath sounds clear to auscultation       Cardiovascular Exercise Tolerance: Good hypertension, Pt. on medications negative cardio ROS Normal cardiovascular exam Rhythm:regular Rate:Normal  EKG 3/19 Sinus rhythm with Premature atrial complexes Right bundle branch block Left anterior fascicular block   Neuro/Psych negative neurological ROS  negative psych ROS   GI/Hepatic negative GI ROS, Neg liver ROS,   Endo/Other  negative endocrine ROS  Renal/GU negative Renal ROS  negative genitourinary   Musculoskeletal  (+) Arthritis , Osteoarthritis,    Abdominal   Peds negative pediatric ROS (+)  Hematology  (+) HIV,   Anesthesia Other Findings   Reproductive/Obstetrics negative OB ROS                             Anesthesia Physical  Anesthesia Plan  ASA: III  Anesthesia Plan: General   Post-op Pain Management:    Induction: Intravenous  PONV Risk Score and Plan: 2 and Ondansetron, Treatment may vary due to age or medical condition and Midazolam  Airway Management Planned: Oral ETT  Additional Equipment:   Intra-op Plan:   Post-operative Plan: Extubation in OR  Informed Consent: I have reviewed the patients History and Physical, chart, labs and discussed the procedure including the risks, benefits and alternatives for the proposed anesthesia with the patient or authorized representative who has indicated his/her understanding and acceptance.   Dental Advisory Given  Plan  Discussed with: CRNA, Anesthesiologist and Surgeon  Anesthesia Plan Comments: (  )        Anesthesia Quick Evaluation

## 2018-05-29 NOTE — Anesthesia Postprocedure Evaluation (Signed)
Anesthesia Post Note  Patient: Troy Marsh  Procedure(s) Performed: POSTERIOR CERVICAL DECOMPRESSION FUSION CERVICAL 4 - CERVICAL 7 WITH INSTRUMENTATION AND ALLOGRAFT (N/A )     Patient location during evaluation: PACU Anesthesia Type: General Level of consciousness: awake and alert Pain management: pain level controlled Vital Signs Assessment: post-procedure vital signs reviewed and stable Respiratory status: spontaneous breathing, nonlabored ventilation and respiratory function stable Cardiovascular status: blood pressure returned to baseline and stable Postop Assessment: no apparent nausea or vomiting Anesthetic complications: no    Last Vitals:  Vitals:   05/29/18 1443 05/29/18 1507  BP: 140/76 (!) 158/80  Pulse: (!) 58 60  Resp: 12 18  Temp: (!) 36.3 C   SpO2: 100% 100%    Last Pain:  Vitals:   05/29/18 1525  TempSrc:   PainSc: [place]

## 2018-05-29 NOTE — H&P (Signed)
PREOPERATIVE H&P  Chief Complaint: Right arm pain, neck pain  HPI: Troy Marsh is a 67 y.o. male who presents with pain in the neck and right arm. Patient is s/p a previous C4-C7 ACDF procedure, which he did well from, but he did have recurrent right arm pain  CT scan reveals a nonunion at C6/7 with recurrent right C6/7 stenosis  Patient has failed multiple forms of conservative care and continues to have pain (see office notes for additional details regarding the patient's full course of treatment)  Past Medical History:  Diagnosis Date  . Arthritis   . Cancer ([place])    Skin cancer- basal and squamous  . Constipation due to pain medication   . HIV disease ([place]) 1985   last CD4 10/02/12 was 277  . Hypertension   . Hypogonadism male    Past Surgical History:  Procedure Laterality Date  . ANTERIOR CERVICAL DECOMP/DISCECTOMY FUSION Bilateral 11/22/2017   Procedure: ANTERIOR CERVICAL DECOMPRESSION FUSION CERVICAL 4-5, CERVICAL 5-6, CERVICAL 6-7 WITH INSTRUMENTATION AND ALLOGRAFT TIME      /   REQUESTED 4 HOURS;  Surgeon: Phylliss Bob, MD;  Location: [place];  Service: Orthopedics;  Laterality: Bilateral;  . COLONOSCOPY W/ POLYPECTOMY    . EYE SURGERY Bilateral    Lasik, Cataract  . [place]  . KNEE ARTHROSCOPY Right 2009  . KNEE ARTHROSCOPY Left 2011  . neck surgery  1984   Social History   Socioeconomic History  . Marital status: Married    Spouse name: [name] (partner)  . Number of children: Not on file  . Years of education: 77  . Highest education level: Not on file  Occupational History  . 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 Financial resource strain: Not on file  . Food insecurity:    Worry: Not on file    Inability: Not on file  . Transportation needs:    Medical: Not on file    Non-medical: Not on file  Tobacco Use  . Smoking status: Former Smoker    Packs/day:  2.00    Years: 35.00    Pack years: 70.00    Types: Cigarettes    Last attempt to quit: 02/10/2004    Years since quitting: 14.3  . Smokeless tobacco: Never Used  Substance and Sexual Activity  . Alcohol use: No    Comment: quit 2004  . Drug use: No  . Sexual activity: Yes    Partners: Male    Birth control/protection: Condom    Comment: monogamous w/ single male partner 64  Lifestyle  . Physical activity:    Days per week: Not on file    Minutes per session: Not on file  . Stress: Not on file  Relationships  . Social connections:    Talks on phone: Not on file    Gets together: Not on file    Attends religious service: Not on file    Active member of club or organization: Not on file    Attends meetings of clubs or organizations: Not on file    Relationship status: Not on file  Other Topics Concern  . Not on file  Social History Narrative   [employer]. Work - [place] [employer] - retired 2009. Long term monogamous relationship. Lives in his own home.  Family History  Problem Relation Age of Onset  . Coronary artery disease Father   . Heart attack Father   . Hypertension Father   . Hyperlipidemia Father   . Cancer Father        prostate  . Diabetes Neg Hx   . Colon cancer Neg Hx    No Known Allergies Prior to Admission medications   Medication Sig Start Date End Date Taking? Authorizing Provider  alfuzosin (UROXATRAL) 10 MG 24 hr tablet Take 10 mg by mouth at bedtime.    Yes [provider]  aspirin 81 MG tablet Take 81 mg by mouth daily.     Yes [provider]  atorvastatin (LIPITOR) 40 MG tablet Take 40 mg by mouth at bedtime.  11/29/15 11/11/18 Yes [provider]  baclofen (LIORESAL) 20 MG tablet Take 20 mg by mouth 3 (three) times daily as needed for muscle spasms.   Yes [provider]  celecoxib (CELEBREX) 200 MG capsule TAKE 1 CAPSULE BY MOUTH TWO TIMES DAILY Patient taking differently:  Take 200 mg by mouth 2 (two) times daily.  02/07/18  Yes Plotnikov, Evie Lacks, MD  cetirizine-pseudoephedrine (ZYRTEC-D ALLERGY & CONGESTION) 5-120 MG tablet Take 1 tablet by mouth daily as needed for allergies.    Yes [provider]  emtricitabine-rilpivir-tenofovir AF (ODEFSEY) 200-25-25 MG TABS tablet Take 1 tablet by mouth daily. 04/10/16  Yes [provider]  gabapentin (NEURONTIN) 100 MG capsule Take 1 capsule (100 mg total) by mouth 3 (three) times daily. Patient taking differently: Take 100 mg by mouth 2 (two) times daily.  06/04/16 11/10/18 Yes Schlossman, Junie Panning, MD  hydrochlorothiazide (HYDRODIURIL) 50 MG tablet TAKE 1 TABLET BY MOUTH  DAILY Patient taking differently: Take 50 mg by mouth daily.  04/15/18  Yes Plotnikov, Evie Lacks, MD  HYDROcodone-acetaminophen (NORCO) 7.5-325 MG tablet Take 0.5-1 tablets by mouth 4 (four) times daily as needed for moderate pain.  04/30/18  Yes [provider]  hydrocortisone (ANUCORT-HC) 25 MG suppository UNWRAP AND INSERT 1  SUPPOSITORY RECTALLY AT  BEDTIME Patient taking differently: Place 25 mg rectally at bedtime.  04/11/18  Yes Irene Shipper, MD  linaclotide [facility]) 72 MCG capsule Take 1 capsule (72 mcg total) by mouth daily before breakfast. 05/16/18  Yes Jackquline Denmark, MD  Melatonin 10 MG TABS Take 10 mg by mouth at bedtime as needed (for sleep).    Yes [provider]  mirabegron ER (MYRBETRIQ) 25 MG TB24 tablet Take 25 mg by mouth at bedtime.    Yes [provider]  montelukast (SINGULAIR) 10 MG tablet TAKE 1 TABLET BY MOUTH  DAILY Patient taking differently: Take 10 mg by mouth daily.  10/04/17  Yes Plotnikov, Evie Lacks, MD  Multiple Vitamin (MULTIVITAMIN WITH MINERALS) TABS tablet Take 1 tablet by mouth daily. COMPLETE MULTIVITAMIN MEN'S 50+   Yes [provider]  polyethylene glycol powder (GLYCOLAX/MIRALAX) powder Take 17 g by mouth daily as needed for moderate constipation.    Yes [provider]  potassium chloride (K-DUR,KLOR-CON) 10 MEQ tablet Take 10 mEq by mouth daily.   Yes [provider]  RESTASIS 0.05 % ophthalmic emulsion Place 1 drop into both eyes 2 (two) times daily. 10/25/15  Yes [provider]  testosterone enanthate (DELATESTRYL) 200 MG/ML injection INJECT 1 ML INTRAMUSCULARLY EVERY 14 DAYS . DISCARD 28 DAYS AFTER INITIAL USE. Patient taking differently: Inject 200 mg into the muscle every 14 (fourteen) days.  05/13/18  Yes Plotnikov, Evie Lacks,  MD  Alprostadil, Vasodilator, (CAVERJECT) 20 MCG SOLR INJECT 1 DOSE DAILY AS  NEEDED AS DIRECTED (DISCARD UNUSED PORTION AFTER FIRST  USE) Patient not taking: Reported on 05/17/2018 04/15/18   Plotnikov, Evie Lacks, MD  B-D TB SYRINGE 1CC/27GX1/2" 27G X 1/2" 1 ML MISC USE TO ADMINISTER CAVERJECT INJECTION, 3 USES PER WEEK  WITH AT LEAST 24 HOURS IN  BETWEEN EACH DOSE. Patient not taking: Reported on 05/17/2018 03/31/18   Plotnikov, Evie Lacks, MD     All other systems have been reviewed and were otherwise negative with the exception of those mentioned in the HPI and as above.  Physical Exam: Vitals:   05/29/18 0701  BP: 138/78  Pulse: (!) 58  Resp: (!) 100  Temp: (!) 97.5 F (36.4 C)  SpO2: (!) 18%    Body mass index is 20.39 kg/m.  General: Alert, no acute distress Cardiovascular: No pedal edema Respiratory: No cyanosis, no use of accessory musculature Skin: No lesions in the area of chief complaint Neurologic: Sensation intact distally Psychiatric: Patient is competent for consent with normal mood and affect Lymphatic: No axillary or cervical lymphadenopathy  MUSCULOSKELETAL: + spurling's sign on the right  Assessment/Plan: RIGHT C7 RADICULOPATHY, C6/7 NONUNION  Plan for Procedure(s): POSTERIOR CERVICAL DECOMPRESSION FUSION CERVICAL 4 - CERVICAL 7 WITH INSTRUMENTATION AND ALLOGRAFT   Norva Karvonen, MD 05/29/2018 7:44 AM

## 2018-05-30 ENCOUNTER — Ambulatory Visit: Payer: Self-pay | Admitting: General Surgery

## 2018-05-30 MED ORDER — GABAPENTIN 300 MG PO CAPS: 300.0000 mg | ORAL_CAPSULE | ORAL | Status: DC

## 2018-05-30 MED ORDER — ACETAMINOPHEN 500 MG PO TABS: 1000.0000 mg | ORAL_TABLET | ORAL | Status: DC

## 2018-05-30 MED ORDER — CEFAZOLIN SODIUM-DEXTROSE 2-4 GM/100ML-% IV SOLN: 2.0000 g | INTRAVENOUS | Status: DC

## 2018-05-30 MED ORDER — CHLORHEXIDINE GLUCONATE CLOTH 2 % EX PADS: 6.0000 | MEDICATED_PAD | Freq: Once | CUTANEOUS | Status: DC

## 2018-05-30 NOTE — H&P (View-Only) (Signed)
History of Present Illness Troy Ok MD; 05/06/2018 2:00 PM) The patient is a 67 year old male who presents with an inguinal hernia. Referred by: Dr. Alain Marion Chief Complaint: Right inguinal hernia  Patient is a 67 year old male with a right inguinal hernia for the last 1-2 months. Patient states that he does do some heavy lifting until some dull discomfort with lifting, straining. Patient does state that rest helps his discomfort. He states it does not flatten out when he lays down. Patient is very active and does go the gym.  Patient had no previous abdominal surgery.    Past Surgical History (Tanisha A. Owens Shark, [place]; 05/06/2018 1:51 PM) Hemorrhoidectomy   Diagnostic Studies History (Tanisha A. Owens Shark, [place]; 05/06/2018 1:51 PM) Colonoscopy  1-5 years ago  Allergies (Tanisha A. Owens Shark, [place]; 05/06/2018 1:52 PM) No Known Drug Allergies [05/06/2018]: Allergies Reconciled   Medication History (Tanisha A. Owens Shark, RMA; 05/06/2018 1:57 PM) Alprostadil (Vasodilator) [facility] Kit, Intracavernosal) Active. hydroCHLOROthiazide (25MG Tablet, Oral) Active. CeleBREX (200MG Capsule, Oral) Active. Testosterone (20.25 MG/1.25GM(1.62%) Gel, Transdermal) Active. Vitamin D3 (5000UNIT Tablet, Oral) Active. Multi-Vitamin (Oral) Active. Melatonin (10MG Tablet, Oral) Active. Docusate Sodium (100MG Tablet, Oral) Active. MiraLax (Oral) Active. Tadalafil (20MG Tablet, Oral) Active. ZyrTEC (5MG Tablet, Oral) Active. Mirabegron ER (25MG Tablet ER 24HR, Oral) Active. Potassium Chloride ER (10MEQ Tablet ER, Oral) Active. Singulair (10MG Tablet, Oral) Active. Baclofen (10MG Tablet, Oral) Active. Gabapentin (100MG Tablet, Oral) Active. Odefsey (200-25-25MG Tablet, Oral) Active. Atorvastatin Calcium (40MG Tablet, Oral) Active. Restasis (0.05% Emulsion, Ophthalmic) Active. Aspirin (81MG Tablet, Oral) Active. Medications Reconciled  Social History (Tanisha A. Owens Shark, [place];  05/06/2018 1:51 PM) Alcohol use  Remotely quit alcohol use. Caffeine use  Coffee. No drug use  Tobacco use  Former smoker.  Other Problems (Tanisha A. Owens Shark, [place]; 05/06/2018 1:51 PM) Arthritis  Back Pain  Enlarged Prostate  Hemorrhoids  High blood pressure  Hypercholesterolemia     Review of Systems Troy Ok MD; 05/06/2018 1:59 PM) General Not Present- Appetite Loss, Chills, Fatigue, Fever, Night Sweats, Weight Gain and Weight Loss. Skin Not Present- Change in Wart/Mole, Dryness, Hives, Jaundice, New Lesions, Non-Healing Wounds, Rash and Ulcer. HEENT Not Present- Earache, Hearing Loss, Hoarseness, Nose Bleed, Oral Ulcers, Ringing in the Ears, Seasonal Allergies, Sinus Pain, Sore Throat, Visual Disturbances, Wears glasses/contact lenses and Yellow Eyes. Gastrointestinal Present- Bloating and Excessive gas. Not Present- Abdominal Pain, Bloody Stool, Change in Bowel Habits, Chronic diarrhea, Constipation, Difficulty Swallowing, Gets full quickly at meals, Hemorrhoids, Indigestion, Nausea, Rectal Pain and Vomiting. Musculoskeletal Present- Back Pain and Joint Stiffness. Not Present- Joint Pain, Muscle Pain, Muscle Weakness and Swelling of Extremities. Hematology Present- HIV. Not Present- Blood Thinners, Easy Bruising, Excessive bleeding, Gland problems and Persistent Infections. All other systems negative  Vitals (Tanisha A. Brown RMA; 05/06/2018 1:52 PM) 05/06/2018 1:51 PM Weight: 143.8 lb Height: 70in Body Surface Area: 1.81 m Body Mass Index: 20.63 kg/m  Temp.: 98.41F  Pulse: 72 (Regular)        Physical Exam Troy Ok MD; 05/06/2018 2:00 PM) The physical exam findings are as follows: Note:Constitutional: No acute distress, conversant, appears stated age  Eyes: Anicteric sclerae, moist conjunctiva, no lid lag  Neck: No thyromegaly, trachea midline, no cervical lymphadenopathy  Lungs: Clear to auscultation biilaterally, normal  respiratory effot  Cardiovascular: regular rate & rhythm, no murmurs, no peripheal edema, pedal pulses 2+  GI: Soft, no masses or hepatosplenomegaly, non-tender to palpation  MSK: Normal gait, no clubbing cyanosis, edema  Skin: No rashes, palpation reveals normal skin  turgor  Psychiatric: Appropriate judgment and insight, oriented to person, place, and time  Abdomen Inspection Hernias - Inguinal hernia - Right - Reducible.    Assessment & Plan Troy Ok MD; 05/06/2018 2:01 PM) RIGHT INGUINAL HERNIA (K40.90) Impression: 67 year old male with history of hypertension, hypercholesterolemia, hypogonadism, with a right inguinal hernia.  1. The patient will like to proceed to the operating room for laparoscopic right inguinal hernia repair with mesh.  2. I discussed with the patient the signs and symptoms of incarceration and strangulation and the need to proceed to the ER should they occur.  3. I discussed with the patient the risks and benefits of the procedure to include but not limited to: Infection, bleeding, damage to surrounding structures, possible need for further surgery, possible nerve pain, and possible recurrence. The patient was understanding and wishes to proceed.

## 2018-05-30 NOTE — Evaluation (Signed)
Occupational Therapy Evaluation and Discharge Patient Details Name: Troy Marsh MRN: [id number] DOB: [birth year] Today's Date: 05/30/2018    History of Present Illness s/p PCF C4-7 PMH: ACDF in May '19   Clinical Impression   Pt is functioning at a modified independent level and able to verbalize cervical precautions. He has adequate support of his partner, housekeeper and neighbors at home. No further OT needs.    Follow Up Recommendations  No OT follow up    Equipment Recommendations  None recommended by OT    Recommendations for Other Services       Precautions / Restrictions Precautions Precautions: Cervical Precaution Booklet Issued: Yes (comment) Precaution Comments: reviewed cervical precautions with pt Required Braces or Orthoses: Cervical Brace Cervical Brace: Hard collar      Mobility Bed Mobility Overal bed mobility: Modified Independent                Transfers Overall transfer level: Modified independent Equipment used: None                  Balance Overall balance assessment: Modified Independent                                         ADL either performed or assessed with clinical judgement   ADL Overall ADL's : Modified independent                                             Vision Patient Visual Report: No change from baseline       Perception     Praxis      Pertinent Vitals/Pain Pain Assessment: Faces Faces Pain Scale: Hurts little more Pain Location: throat Pain Descriptors / Indicators: Sore Pain Intervention(s): Monitored during session     Hand Dominance Right   Extremity/Trunk Assessment Upper Extremity Assessment Upper Extremity Assessment: Overall WFL for tasks assessed   Lower Extremity Assessment Lower Extremity Assessment: Overall WFL for tasks assessed       Communication Communication Communication: No difficulties   Cognition Arousal/Alertness:  Awake/alert Behavior During Therapy: WFL for tasks assessed/performed Overall Cognitive Status: Within Functional Limits for tasks assessed                                     General Comments       Exercises     Shoulder Instructions      Home Living Family/patient expects to be discharged to:: Private residence Living Arrangements: Spouse/significant other Available Help at Discharge: Family;Available 24 hours/day Type of Home: House Home Access: Stairs to enter [employer] of Steps: 4 Entrance Stairs-Rails: Can reach both Home Layout: Two level Alternate Level Stairs-Number of Steps: 20 Alternate Level Stairs-Rails: Can reach both Bathroom Shower/Tub: Occupational psychologist: Handicapped height     Home Equipment: Other (comment);Shower seat;Hand held Doctor, general practice)   Additional Comments: has cleaning person who does laundry and changes beds also      Prior Functioning/Environment Level of Independence: Independent                 OT Problem List:        OT Treatment/Interventions:  OT Goals(Current goals can be found in the care plan section) Acute Rehab OT Goals Patient Stated Goal: to return home with his partner  OT Frequency:     Barriers to D/C:            Co-evaluation              AM-PAC PT "6 Clicks" Daily Activity     Outcome Measure Help from another person eating meals?: None Help from another person taking care of personal grooming?: None Help from another person toileting, which includes using toliet, bedpan, or urinal?: None Help from another person bathing (including washing, rinsing, drying)?: None Help from another person to put on and taking off regular upper body clothing?: None Help from another person to put on and taking off regular lower body clothing?: None 6 Click Score: 24   End of Session Equipment Utilized During Treatment: Gait belt;Cervical collar Nurse  Communication: Other (comment)(ready to d/c)  Activity Tolerance: Patient tolerated treatment well Patient left: in bed;with call bell/phone within reach  OT Visit Diagnosis: Pain                Time: 2244-9753 OT Time Calculation (min): 22 min Charges:  OT General Charges $OT Visit: 1 Visit OT Evaluation $OT Eval Low Complexity: 1 Low  Nestor Lewandowsky, OTR/L Acute Rehabilitation Services Pager: [phone] Office: [phone]  Troy Marsh 05/30/2018, 9:08 AM

## 2018-05-30 NOTE — Progress Notes (Signed)
Patient is discharged from room 3C11 at this time. Alert and in stable condition. IV site d/c'd and instructions read to patient and partner with understanding verbalized. Left unit via wheelchair with all belongings at side.

## 2018-05-30 NOTE — H&P (Signed)
History of Present Illness Ralene Ok MD; 05/06/2018 2:00 PM) The patient is a 67 year old male who presents with an inguinal hernia. Referred by: Dr. Alain Marion Chief Complaint: Right inguinal hernia  Patient is a 67 year old male with a right inguinal hernia for the last 1-2 months. Patient states that he does do some heavy lifting until some dull discomfort with lifting, straining. Patient does state that rest helps his discomfort. He states it does not flatten out when he lays down. Patient is very active and does go the gym.  Patient had no previous abdominal surgery.    Past Surgical History (Tanisha A. Owens Shark, [place]; 05/06/2018 1:51 PM) Hemorrhoidectomy   Diagnostic Studies History (Tanisha A. Owens Shark, [place]; 05/06/2018 1:51 PM) Colonoscopy  1-5 years ago  Allergies (Tanisha A. Owens Shark, [place]; 05/06/2018 1:52 PM) No Known Drug Allergies [05/06/2018]: Allergies Reconciled   Medication History (Tanisha A. Owens Shark, RMA; 05/06/2018 1:57 PM) Alprostadil (Vasodilator) [facility] Kit, Intracavernosal) Active. hydroCHLOROthiazide (25MG Tablet, Oral) Active. CeleBREX (200MG Capsule, Oral) Active. Testosterone (20.25 MG/1.25GM(1.62%) Gel, Transdermal) Active. Vitamin D3 (5000UNIT Tablet, Oral) Active. Multi-Vitamin (Oral) Active. Melatonin (10MG Tablet, Oral) Active. Docusate Sodium (100MG Tablet, Oral) Active. MiraLax (Oral) Active. Tadalafil (20MG Tablet, Oral) Active. ZyrTEC (5MG Tablet, Oral) Active. Mirabegron ER (25MG Tablet ER 24HR, Oral) Active. Potassium Chloride ER (10MEQ Tablet ER, Oral) Active. Singulair (10MG Tablet, Oral) Active. Baclofen (10MG Tablet, Oral) Active. Gabapentin (100MG Tablet, Oral) Active. Odefsey (200-25-25MG Tablet, Oral) Active. Atorvastatin Calcium (40MG Tablet, Oral) Active. Restasis (0.05% Emulsion, Ophthalmic) Active. Aspirin (81MG Tablet, Oral) Active. Medications Reconciled  Social History (Tanisha A. Owens Shark, [place];  05/06/2018 1:51 PM) Alcohol use  Remotely quit alcohol use. Caffeine use  Coffee. No drug use  Tobacco use  Former smoker.  Other Problems (Tanisha A. Owens Shark, [place]; 05/06/2018 1:51 PM) Arthritis  Back Pain  Enlarged Prostate  Hemorrhoids  High blood pressure  Hypercholesterolemia     Review of Systems Ralene Ok MD; 05/06/2018 1:59 PM) General Not Present- Appetite Loss, Chills, Fatigue, Fever, Night Sweats, Weight Gain and Weight Loss. Skin Not Present- Change in Wart/Mole, Dryness, Hives, Jaundice, New Lesions, Non-Healing Wounds, Rash and Ulcer. HEENT Not Present- Earache, Hearing Loss, Hoarseness, Nose Bleed, Oral Ulcers, Ringing in the Ears, Seasonal Allergies, Sinus Pain, Sore Throat, Visual Disturbances, Wears glasses/contact lenses and Yellow Eyes. Gastrointestinal Present- Bloating and Excessive gas. Not Present- Abdominal Pain, Bloody Stool, Change in Bowel Habits, Chronic diarrhea, Constipation, Difficulty Swallowing, Gets full quickly at meals, Hemorrhoids, Indigestion, Nausea, Rectal Pain and Vomiting. Musculoskeletal Present- Back Pain and Joint Stiffness. Not Present- Joint Pain, Muscle Pain, Muscle Weakness and Swelling of Extremities. Hematology Present- HIV. Not Present- Blood Thinners, Easy Bruising, Excessive bleeding, Gland problems and Persistent Infections. All other systems negative  Vitals (Tanisha A. Brown RMA; 05/06/2018 1:52 PM) 05/06/2018 1:51 PM Weight: 143.8 lb Height: 70in Body Surface Area: 1.81 m Body Mass Index: 20.63 kg/m  Temp.: 98.41F  Pulse: 72 (Regular)        Physical Exam Ralene Ok MD; 05/06/2018 2:00 PM) The physical exam findings are as follows: Note:Constitutional: No acute distress, conversant, appears stated age  Eyes: Anicteric sclerae, moist conjunctiva, no lid lag  Neck: No thyromegaly, trachea midline, no cervical lymphadenopathy  Lungs: Clear to auscultation biilaterally, normal  respiratory effot  Cardiovascular: regular rate & rhythm, no murmurs, no peripheal edema, pedal pulses 2+  GI: Soft, no masses or hepatosplenomegaly, non-tender to palpation  MSK: Normal gait, no clubbing cyanosis, edema  Skin: No rashes, palpation reveals normal skin  turgor  Psychiatric: Appropriate judgment and insight, oriented to person, place, and time  Abdomen Inspection Hernias - Inguinal hernia - Right - Reducible.    Assessment & Plan Ralene Ok MD; 05/06/2018 2:01 PM) RIGHT INGUINAL HERNIA (K40.90) Impression: 67 year old male with history of hypertension, hypercholesterolemia, hypogonadism, with a right inguinal hernia.  1. The patient will like to proceed to the operating room for laparoscopic right inguinal hernia repair with mesh.  2. I discussed with the patient the signs and symptoms of incarceration and strangulation and the need to proceed to the ER should they occur.  3. I discussed with the patient the risks and benefits of the procedure to include but not limited to: Infection, bleeding, damage to surrounding structures, possible need for further surgery, possible nerve pain, and possible recurrence. The patient was understanding and wishes to proceed.

## 2018-05-30 NOTE — Progress Notes (Signed)
    Patient doing well  Denies arm pain, neck pain is minimal Did report numbness in right hand yesterday, but this has improved   Physical Exam: Vitals:   05/29/18 2300 05/30/18 0324  BP: 133/68 122/71  Pulse: 75 76  Resp: 18 18  Temp: 98.2 F (36.8 C) 98.2 F (36.8 C)  SpO2: 99% 97%    Neck soft/supple Dressing in place NVI  POD #1 s/p PCF, doing well  - encourage ambulation - Percocet for pain, Valium for muscle spasms - d/c home today with f/u in 2 weeks

## 2018-05-31 MED FILL — Thrombin (Recombinant) For Soln 20000 Unit: CUTANEOUS | Qty: 1 | Status: AC

## 2018-05-31 NOTE — Pre-Procedure Instructions (Signed)
Troy Marsh  05/31/2018      [facility] - [place],  - [place]. [place] [address] Phone: [phone] Fax: [place], [place] [address] Phone: [phone] Fax: [place], [place] [facility] [address] [place] [address] [place] [address] Phone: [phone] Fax: [phone]  [place], [place] [address] Phone: [phone] Fax: [phone]    Your procedure is scheduled on 06/11/18.  Report to [facility] Admitting at 530 A.M.  Call this number if you have problems the morning of surgery:  [phone]   Remember:  Do not eat or drink after midnight.      Take these medicines the morning of surgery with A SIP OF WATER --all inhalers,neurontin,singular    Do not wear jewelry, make-up or nail polish.  Do not wear lotions, powders, or perfumes, or deodorant.  Do not shave 48 hours prior to surgery.  Men may shave face and neck.  Do not bring valuables to the hospital.  [facility] is not responsible for any belongings or valuables.  Contacts, dentures or bridgework may not be worn into surgery.  Leave your suitcase in the car.  After surgery it may be brought to your room.  For patients admitted to the hospital, discharge time will be determined by your treatment team.  Patients discharged the day of surgery will not be allowed to drive home.   Name and phone number of your driver:    Special instructions:  [place] - Preparing for Surgery  Before surgery, you can play an important role.  Because skin is not sterile, your skin needs to be as free of germs as possible.  You can reduce the number of germs on you skin by washing with CHG  (chlorahexidine gluconate) soap before surgery.  CHG is an antiseptic cleaner which kills germs and bonds with the skin to continue killing germs even after washing.  Oral Hygiene is also important in reducing the risk of infection.  Remember to brush your teeth with your regular toothpaste the morning of surgery.  Please DO NOT use if you have an allergy to CHG or antibacterial soaps.  If your skin becomes reddened/irritated stop using the CHG and inform your nurse when you arrive at Short Stay.  Do not shave (including legs and underarms) for at least 48 hours prior to the first CHG shower.  You may shave your face.  Please follow these instructions carefully:   1.  Shower with CHG Soap the night before surgery and the morning of Surgery.  2.  If you choose to wash your hair, wash your hair first as usual with your normal shampoo.  3.  After you shampoo, rinse your hair and body thoroughly to remove the shampoo. 4.  Use CHG as you would any other liquid soap.  You can apply chg directly to the skin and wash gently with a      scrungie or washcloth.           5.  Apply the CHG Soap to your body ONLY FROM THE NECK DOWN.   Do not use on open wounds or open sores. Avoid contact with your eyes, ears, mouth and genitals (private parts).  Wash  genitals (private parts) with your normal soap.  6.  Wash thoroughly, paying special attention to the area where your surgery will be performed.  7.  Thoroughly rinse your body with warm water from the neck down.  8.  DO NOT shower/wash with your normal soap after using and rinsing off the CHG Soap.  9.  Pat yourself dry with a clean towel.            10.  Wear clean pajamas.            11.  Place clean sheets on your bed the night of your first shower and do not sleep with pets.  Day of Surgery  Do not apply any lotions/deoderants the morning of surgery.   Please wear clean clothes to the hospital/surgery center. Remember to brush your teeth with  toothpaste.  Do not take any aspirin,anti-inflammatories,vitamins,or herbal supplements 5-7 days prior to surgery.  Please read over the following fact sheets that you were given.

## 2018-06-03 ENCOUNTER — Encounter (HOSPITAL_COMMUNITY): Payer: Self-pay

## 2018-06-03 ENCOUNTER — Other Ambulatory Visit: Payer: Self-pay

## 2018-06-03 ENCOUNTER — Encounter (HOSPITAL_COMMUNITY)
Admission: RE | Admit: 2018-06-03 | Discharge: 2018-06-03 | Disposition: A | Discharge status: Home / Self Care | Payer: Medicare Other | Source: Ambulatory Visit | Attending: General Surgery | Admitting: General Surgery

## 2018-06-03 DIAGNOSIS — Z01818 Encounter for other preprocedural examination: Secondary | ICD-10-CM | POA: Diagnosis not present

## 2018-06-03 DIAGNOSIS — K4090 Unilateral inguinal hernia, without obstruction or gangrene, not specified as recurrent: Secondary | ICD-10-CM | POA: Diagnosis not present

## 2018-06-03 HISTORY — DX: Benign prostatic hyperplasia without lower urinary tract symptoms: N40.0

## 2018-06-03 LAB — CBC
HCT: 42.6 % (ref 39.0–52.0)
Hemoglobin: 14.5 g/dL (ref 13.0–17.0)
MCH: 33.1 pg (ref 26.0–34.0)
MCHC: 34 g/dL (ref 30.0–36.0)
MCV: 97.3 fL (ref 80.0–100.0)
NRBC: 0 % (ref 0.0–0.2)
PLATELETS: 141 10*3/uL — AB (ref 150–400)
RBC: 4.38 MIL/uL (ref 4.22–5.81)
RDW: 11.9 % (ref 11.5–15.5)
WBC: 5.4 10*3/uL (ref 4.0–10.5)

## 2018-06-03 LAB — BASIC METABOLIC PANEL
ANION GAP: 5 (ref 5–15)
BUN: 14 mg/dL (ref 8–23)
CALCIUM: 9.2 mg/dL (ref 8.9–10.3)
CO2: 31 mmol/L (ref 22–32)
Chloride: 98 mmol/L (ref 98–111)
Creatinine, Ser: 1.05 mg/dL (ref 0.61–1.24)
Glucose, Bld: 96 mg/dL (ref 70–99)
Potassium: 4.4 mmol/L (ref 3.5–5.1)
SODIUM: 134 mmol/L — AB (ref 135–145)

## 2018-06-03 NOTE — Pre-Procedure Instructions (Signed)
KYREL LEIGHTON  06/03/2018      [facility] - [place], [place]. [place] [address] Phone: [phone] Fax: [place], [place] [address] Phone: [phone] Fax: [place], [place] [facility] [address] [place] [address] [place] [address] Phone: [phone] Fax: [phone]  [place], [place] [address] Phone: [phone] Fax: [phone]    Your procedure is scheduled on Tuesday, Nov 26th   Report to [facility] at 530 A.M.             (posted surgery time 7:30a - 8:45a)   Call this number if you have problems the morning of surgery:  [phone]   Remember:   Do not eat any foods or drink any liquids after midnight, Monday.    Take these medicines the morning of surgery with A SIP OF WATER --all inhalers,neurontin,singular    Do not wear jewelry - no rings or watches.  Do not wear lotions, colognes or deodorant.             Men may shave face and neck.  Do not bring valuables to the hospital.  [facility] is not responsible for any belongings or valuables.  Contacts, dentures or bridgework may not be worn into surgery.  Leave your suitcase in the car.  After surgery it may be brought to your room.  Patients discharged the day of surgery will not be allowed to drive home, and will need someone to stay with you for the first 24 hrs.     Special instructions:  [place] - Preparing for Surgery  Before surgery, you can play an important role.  Because skin is not sterile, your skin needs to be as free of germs as possible.  You can reduce the number of germs on you skin by washing with CHG (chlorahexidine gluconate) soap before  surgery.  CHG is an antiseptic cleaner which kills germs and bonds with the skin to continue killing germs even after washing.   Oral Hygiene is also important in reducing the risk of infection.  Remember to brush your teeth with your regular toothpaste the morning of surgery.  Please DO NOT use if you have an allergy to CHG or antibacterial soaps.  If your skin becomes reddened/irritated stop using the CHG and inform your nurse when you arrive at Short Stay.                                                                                                                     You may shave your face.  Please follow these instructions carefully:   1.  Shower with CHG Soap the night before surgery and the morning of Surgery.   2.  If you choose to wash your hair, wash your hair first as usual with your normal shampoo.   3.  After you shampoo, rinse your hair and body thoroughly to remove the shampoo.  4.  Use CHG as you would any other liquid soap.  You can apply chg directly to the skin and wash gently with a      scrungie or washcloth.            5.  Apply the CHG Soap to your body ONLY FROM THE NECK DOWN.   Do not use on open wounds or open sores. Avoid contact with your eyes, ears, mouth and genitals (private parts).  Wash genitals (private parts) with your normal soap.   6.  Wash thoroughly, paying special attention to the area where your surgery will be performed.  7.  Thoroughly rinse your body with warm water from the neck down.  8.  DO NOT shower/wash with your normal soap after using and rinsing off the CHG Soap.  9.  Pat yourself dry with a clean towel.            10.  Wear clean pajamas.            11.  Place clean sheets on your bed the night of your first shower and do not sleep with pets.  Day of Surgery  Do not apply any lotions/deoderants the morning of surgery.   Please wear clean clothes to the hospital/surgery center. Remember to brush your teeth with toothpaste.  Do  not take any aspirin,anti-inflammatories,vitamins,or herbal supplements 5-7 days prior to surgery.  Please read over the following fact sheets that you were given.

## 2018-06-03 NOTE — Progress Notes (Signed)
PCP is Dr. Alain Marion  LOV has been with in last 6 mths. Denies murmur, cp, sob Recently been dx with prostate problems.  Goes to [facility].

## 2018-06-04 MED FILL — Heparin Sodium (Porcine) Inj 1000 Unit/ML: INTRAMUSCULAR | Qty: 30 | Status: AC

## 2018-06-04 MED FILL — Sodium Chloride IV Soln 0.9%: INTRAVENOUS | Qty: 1000 | Status: AC

## 2018-06-06 ENCOUNTER — Other Ambulatory Visit: Payer: Self-pay | Admitting: Internal Medicine

## 2018-06-10 ENCOUNTER — Other Ambulatory Visit: Payer: Self-pay | Admitting: Internal Medicine

## 2018-06-11 ENCOUNTER — Encounter (HOSPITAL_COMMUNITY): Payer: Self-pay

## 2018-06-11 ENCOUNTER — Ambulatory Visit (HOSPITAL_COMMUNITY): Payer: Medicare Other | Admitting: Anesthesiology

## 2018-06-11 ENCOUNTER — Ambulatory Visit (HOSPITAL_COMMUNITY)
Admission: RE | Admit: 2018-06-11 | Discharge: 2018-06-11 | Disposition: A | Discharge status: Home / Self Care | Payer: Medicare Other | Source: Ambulatory Visit | Attending: General Surgery | Admitting: General Surgery

## 2018-06-11 ENCOUNTER — Other Ambulatory Visit: Payer: Self-pay

## 2018-06-11 ENCOUNTER — Encounter (HOSPITAL_COMMUNITY)
Admission: RE | Disposition: A | Discharge status: Home / Self Care | Payer: Self-pay | Source: Ambulatory Visit | Attending: General Surgery

## 2018-06-11 DIAGNOSIS — Z79899 Other long term (current) drug therapy: Secondary | ICD-10-CM | POA: Insufficient documentation

## 2018-06-11 DIAGNOSIS — Z79890 Hormone replacement therapy (postmenopausal): Secondary | ICD-10-CM | POA: Diagnosis not present

## 2018-06-11 DIAGNOSIS — M1990 Unspecified osteoarthritis, unspecified site: Secondary | ICD-10-CM | POA: Diagnosis not present

## 2018-06-11 DIAGNOSIS — Z7951 Long term (current) use of inhaled steroids: Secondary | ICD-10-CM | POA: Insufficient documentation

## 2018-06-11 DIAGNOSIS — I10 Essential (primary) hypertension: Secondary | ICD-10-CM | POA: Insufficient documentation

## 2018-06-11 DIAGNOSIS — Z87891 Personal history of nicotine dependence: Secondary | ICD-10-CM | POA: Insufficient documentation

## 2018-06-11 DIAGNOSIS — K4090 Unilateral inguinal hernia, without obstruction or gangrene, not specified as recurrent: Secondary | ICD-10-CM | POA: Diagnosis present

## 2018-06-11 DIAGNOSIS — Z7982 Long term (current) use of aspirin: Secondary | ICD-10-CM | POA: Diagnosis not present

## 2018-06-11 DIAGNOSIS — E7800 Pure hypercholesterolemia, unspecified: Secondary | ICD-10-CM | POA: Diagnosis not present

## 2018-06-11 HISTORY — PX: INGUINAL HERNIA REPAIR: SHX194

## 2018-06-11 HISTORY — PX: INSERTION OF MESH: SHX5868

## 2018-06-11 SURGERY — REPAIR, HERNIA, INGUINAL, LAPAROSCOPIC
Anesthesia: General | Site: Groin | Laterality: Right

## 2018-06-11 MED ORDER — SUCCINYLCHOLINE CHLORIDE 200 MG/10ML IV SOSY
PREFILLED_SYRINGE | INTRAVENOUS | Status: AC
  Filled 2018-06-11: qty 10

## 2018-06-11 MED ORDER — LIDOCAINE 2% (20 MG/ML) 5 ML SYRINGE
INTRAMUSCULAR | Status: DC | PRN
  Administered 2018-06-11: 40 mg via INTRAVENOUS

## 2018-06-11 MED ORDER — DEXAMETHASONE SODIUM PHOSPHATE 10 MG/ML IJ SOLN
INTRAMUSCULAR | Status: AC
  Filled 2018-06-11: qty 1

## 2018-06-11 MED ORDER — MIDAZOLAM HCL 2 MG/2ML IJ SOLN
INTRAMUSCULAR | Status: AC
  Filled 2018-06-11: qty 2

## 2018-06-11 MED ORDER — FENTANYL CITRATE (PF) 100 MCG/2ML IJ SOLN
25.0000 ug | INTRAMUSCULAR | Status: DC | PRN
  Administered 2018-06-11: 50 ug via INTRAVENOUS

## 2018-06-11 MED ORDER — ONDANSETRON HCL 4 MG/2ML IJ SOLN: 4.0000 mg | Freq: Once | INTRAMUSCULAR | Status: DC | PRN

## 2018-06-11 MED ORDER — SODIUM CHLORIDE 0.9 % IV SOLN
INTRAVENOUS | Status: DC | PRN
  Administered 2018-06-11: 20 ug/min via INTRAVENOUS

## 2018-06-11 MED ORDER — FENTANYL CITRATE (PF) 100 MCG/2ML IJ SOLN
INTRAMUSCULAR | Status: AC
  Filled 2018-06-11: qty 2

## 2018-06-11 MED ORDER — PHENYLEPHRINE 40 MCG/ML (10ML) SYRINGE FOR IV PUSH (FOR BLOOD PRESSURE SUPPORT)
PREFILLED_SYRINGE | INTRAVENOUS | Status: AC
  Filled 2018-06-11: qty 10

## 2018-06-11 MED ORDER — ROCURONIUM BROMIDE 50 MG/5ML IV SOSY
PREFILLED_SYRINGE | INTRAVENOUS | Status: AC
  Filled 2018-06-11: qty 5

## 2018-06-11 MED ORDER — LIDOCAINE 2% (20 MG/ML) 5 ML SYRINGE
INTRAMUSCULAR | Status: AC
  Filled 2018-06-11: qty 5

## 2018-06-11 MED ORDER — PROPOFOL 10 MG/ML IV BOLUS
INTRAVENOUS | Status: AC
  Filled 2018-06-11: qty 20

## 2018-06-11 MED ORDER — OXYCODONE HCL 5 MG PO TABS
ORAL_TABLET | ORAL | Status: AC
  Filled 2018-06-11: qty 1

## 2018-06-11 MED ORDER — SUGAMMADEX SODIUM 200 MG/2ML IV SOLN
INTRAVENOUS | Status: DC | PRN
  Administered 2018-06-11: 200 mg via INTRAVENOUS

## 2018-06-11 MED ORDER — BUPIVACAINE HCL (PF) 0.25 % IJ SOLN
INTRAMUSCULAR | Status: AC
  Filled 2018-06-11: qty 30

## 2018-06-11 MED ORDER — BUPIVACAINE HCL 0.25 % IJ SOLN
INTRAMUSCULAR | Status: DC | PRN
  Administered 2018-06-11: 3 mL

## 2018-06-11 MED ORDER — ONDANSETRON HCL 4 MG/2ML IJ SOLN
INTRAMUSCULAR | Status: AC
  Filled 2018-06-11: qty 2

## 2018-06-11 MED ORDER — OXYCODONE HCL 5 MG PO TABS
5.0000 mg | ORAL_TABLET | Freq: Once | ORAL | Status: AC | PRN
  Administered 2018-06-11: 5 mg via ORAL

## 2018-06-11 MED ORDER — 0.9 % SODIUM CHLORIDE (POUR BTL) OPTIME
TOPICAL | Status: DC | PRN
  Administered 2018-06-11: 1000 mL

## 2018-06-11 MED ORDER — FENTANYL CITRATE (PF) 250 MCG/5ML IJ SOLN
INTRAMUSCULAR | Status: DC | PRN
  Administered 2018-06-11 (×2): 50 ug via INTRAVENOUS
  Administered 2018-06-11: 100 ug via INTRAVENOUS
  Administered 2018-06-11: 50 ug via INTRAVENOUS

## 2018-06-11 MED ORDER — EPHEDRINE 5 MG/ML INJ
INTRAVENOUS | Status: AC
  Filled 2018-06-11: qty 10

## 2018-06-11 MED ORDER — PROPOFOL 10 MG/ML IV BOLUS
INTRAVENOUS | Status: DC | PRN
  Administered 2018-06-11: 160 mg via INTRAVENOUS
  Administered 2018-06-11: 30 mg via INTRAVENOUS

## 2018-06-11 MED ORDER — TRAMADOL HCL 50 MG PO TABS: 50.0000 mg | ORAL_TABLET | Freq: Four times a day (QID) | ORAL | 0 refills | Status: DC | PRN

## 2018-06-11 MED ORDER — LACTATED RINGERS IV SOLN
INTRAVENOUS | Status: DC | PRN
  Administered 2018-06-11 (×2): via INTRAVENOUS

## 2018-06-11 MED ORDER — CEFAZOLIN SODIUM-DEXTROSE 2-3 GM-%(50ML) IV SOLR
INTRAVENOUS | Status: DC | PRN
  Administered 2018-06-11: 2 g via INTRAVENOUS

## 2018-06-11 MED ORDER — OXYCODONE HCL 5 MG/5ML PO SOLN: 5.0000 mg | Freq: Once | ORAL | Status: AC | PRN

## 2018-06-11 MED ORDER — FENTANYL CITRATE (PF) 250 MCG/5ML IJ SOLN
INTRAMUSCULAR | Status: AC
  Filled 2018-06-11: qty 5

## 2018-06-11 MED ORDER — ROCURONIUM BROMIDE 10 MG/ML (PF) SYRINGE
PREFILLED_SYRINGE | INTRAVENOUS | Status: DC | PRN
  Administered 2018-06-11: 50 mg via INTRAVENOUS

## 2018-06-11 MED ORDER — ONDANSETRON HCL 4 MG/2ML IJ SOLN
INTRAMUSCULAR | Status: DC | PRN
  Administered 2018-06-11: 4 mg via INTRAVENOUS

## 2018-06-11 MED ORDER — DEXAMETHASONE SODIUM PHOSPHATE 10 MG/ML IJ SOLN
INTRAMUSCULAR | Status: DC | PRN
  Administered 2018-06-11: 8 mg via INTRAVENOUS

## 2018-06-11 SURGICAL SUPPLY — 37 items
CANISTER SUCT 3000ML PPV (MISCELLANEOUS) IMPLANT
COVER SURGICAL LIGHT HANDLE (MISCELLANEOUS) ×2 IMPLANT
COVER WAND RF STERILE (DRAPES) ×2 IMPLANT
DEFOGGER SCOPE WARMER CLEARIFY (MISCELLANEOUS) IMPLANT
DERMABOND ADVANCED (GAUZE/BANDAGES/DRESSINGS) ×1
DERMABOND ADVANCED .7 DNX12 (GAUZE/BANDAGES/DRESSINGS) ×1 IMPLANT
DISSECTOR BLUNT TIP ENDO 5MM (MISCELLANEOUS) IMPLANT
ELECT REM PT RETURN 9FT ADLT (ELECTROSURGICAL) ×2
ELECTRODE REM PT RTRN 9FT ADLT (ELECTROSURGICAL) ×1 IMPLANT
GLOVE BIO SURGEON STRL SZ7.5 (GLOVE) ×2 IMPLANT
GOWN STRL REUS W/ TWL LRG LVL3 (GOWN DISPOSABLE) ×2 IMPLANT
GOWN STRL REUS W/ TWL XL LVL3 (GOWN DISPOSABLE) ×1 IMPLANT
GOWN STRL REUS W/TWL LRG LVL3 (GOWN DISPOSABLE) ×2
GOWN STRL REUS W/TWL XL LVL3 (GOWN DISPOSABLE) ×2
KIT BASIN OR (CUSTOM PROCEDURE TRAY) ×2 IMPLANT
KIT TURNOVER KIT B (KITS) ×2 IMPLANT
MESH 3DMAX 5X7 RT XLRG (Mesh General) ×2 IMPLANT
NEEDLE INSUFFLATION 14GA 120MM (NEEDLE) IMPLANT
NS IRRIG 1000ML POUR BTL (IV SOLUTION) ×2 IMPLANT
PAD ARMBOARD 7.5X6 YLW CONV (MISCELLANEOUS) ×4 IMPLANT
RELOAD STAPLE HERNIA 4.0 BLUE (INSTRUMENTS) ×2 IMPLANT
RELOAD STAPLE HERNIA 4.8 BLK (STAPLE) IMPLANT
SCISSORS LAP 5X35 DISP (ENDOMECHANICALS) ×2 IMPLANT
SET IRRIG TUBING LAPAROSCOPIC (IRRIGATION / IRRIGATOR) IMPLANT
STAPLER HERNIA 12 8.5 360D (INSTRUMENTS) ×2 IMPLANT
SUT MNCRL AB 4-0 PS2 18 (SUTURE) ×2 IMPLANT
SUT VIC AB 1 CT1 27 (SUTURE)
SUT VIC AB 1 CT1 27XBRD ANBCTR (SUTURE) IMPLANT
SYRINGE TOOMEY DISP (SYRINGE) ×2 IMPLANT
TOWEL OR 17X24 6PK STRL BLUE (TOWEL DISPOSABLE) ×2 IMPLANT
TOWEL OR 17X26 10 PK STRL BLUE (TOWEL DISPOSABLE) ×2 IMPLANT
TRAY FOLEY W/BAG SLVR 14FR (SET/KITS/TRAYS/PACK) ×2 IMPLANT
TRAY LAPAROSCOPIC MC (CUSTOM PROCEDURE TRAY) ×2 IMPLANT
TROCAR CANNULA W/PORT DUAL 5MM (MISCELLANEOUS) ×2 IMPLANT
TROCAR XCEL 12X100 BLDLESS (ENDOMECHANICALS) ×2 IMPLANT
TUBING INSUFFLATION (TUBING) ×2 IMPLANT
WATER STERILE IRR 1000ML POUR (IV SOLUTION) ×2 IMPLANT

## 2018-06-11 NOTE — Interval H&P Note (Signed)
History and Physical Interval Note:  06/11/2018 7:10 AM  Troy Marsh  has presented today for surgery, with the diagnosis of RIGHT INGUINA HERNIA  The various methods of treatment have been discussed with the patient and family. After consideration of risks, benefits and other options for treatment, the patient has consented to  Procedure(s): LAPAROSCOPIC RIGHT INGUINAL HERNIA WITH MESH (Right) INSERTION OF MESH (Right) as a surgical intervention .  The patient's history has been reviewed, patient examined, no change in status, stable for surgery.  I have reviewed the patient's chart and labs.  Questions were answered to the patient's satisfaction.     Ralene Ok

## 2018-06-11 NOTE — Transfer of Care (Signed)
Immediate Anesthesia Transfer of Care Note  Patient: Troy Marsh  Procedure(s) Performed: LAPAROSCOPIC RIGHT INGUINAL HERNIA WITH MESH (Right Groin) INSERTION OF MESH (Right Groin)  Patient Location: PACU  Anesthesia Type:General  Level of Consciousness: awake, alert  and oriented  Airway & Oxygen Therapy: Patient Spontanous Breathing  Post-op Assessment: Report given to RN and Post -op Vital signs reviewed and stable  Post vital signs: Reviewed and stable  Last Vitals:  Vitals Value Taken Time  BP 149/92 06/11/2018  8:45 AM  Temp 36.7 C 06/11/2018  8:45 AM  Pulse 75 06/11/2018  8:45 AM  Resp 18 06/11/2018  8:45 AM  SpO2 100 % 06/11/2018  8:45 AM    Last Pain:  Vitals:   06/11/18 0552  TempSrc: Oral  PainSc: 0-No pain      Patients Stated Pain Goal: 8 (81/19/14 7829)  Complications: No apparent anesthesia complications

## 2018-06-11 NOTE — Anesthesia Procedure Notes (Signed)
Procedure Name: Intubation Date/Time: 06/11/2018 7:39 AM Performed by: Marsa Aris, CRNA Pre-anesthesia Checklist: Patient identified, Emergency Drugs available, Suction available and Patient being monitored Patient Re-evaluated:Patient Re-evaluated prior to induction Oxygen Delivery Method: Circle System Utilized Preoxygenation: Pre-oxygenation with 100% oxygen Induction Type: IV induction Ventilation: Mask ventilation without difficulty and Oral airway inserted - appropriate to patient size Laryngoscope Size: Glidescope and 3 Grade View: Grade I Tube type: Oral Tube size: 7.5 mm Number of attempts: 1 Airway Equipment and Method: Stylet,  Oral airway and Video-laryngoscopy Placement Confirmation: ETT inserted through vocal cords under direct vision,  positive ETCO2 and breath sounds checked- equal and bilateral Secured at: 22 cm Tube secured with: Tape Dental Injury: Teeth and Oropharynx as per pre-operative assessment  Comments: No change in dentition from pre-procedure, pt c-spine neutrality maintained, neck collar remains in place

## 2018-06-11 NOTE — Op Note (Signed)
06/11/2018  8:28 AM  PATIENT:  Lilian Kapur  67 y.o. male  PRE-OPERATIVE DIAGNOSIS:  RIGHT INDIRECT INGUINAL HERNIA  POST-OPERATIVE DIAGNOSIS:  RIGHT INDIRECT INGUINAL HERNIA, MODERATE  PROCEDURE:  Procedure(s): LAPAROSCOPIC RIGHT INGUINAL HERNIA WITH MESH (Right) INSERTION OF MESH (Right)  SURGEON:  Surgeon(s) and Role:    * Ralene Ok, MD - Primary  ANESTHESIA:   local and general  EBL:  minimal   BLOOD ADMINISTERED:none  DRAINS: none   LOCAL MEDICATIONS USED:  BUPIVICAINE   SPECIMEN:  No Specimen  DISPOSITION OF SPECIMEN:  N/A  COUNTS:  YES  TOURNIQUET:  * No tourniquets in log *  DICTATION: .Dragon Dictation  Counts: reported as correct x 2  Findings:  The patient had a moderate right indirect hernia  Indications for procedure:  The patient is a 67 year old male with a right inguinal hernia for several months. Patient complained of symptomatology to his right inguinal area. The patient was taken back for elective inguinal hernia repair.  Details of the procedure: The patient was taken back to the operating room. The patient was placed in supine position with bilateral SCDs in place.  The patient was prepped and draped in the usual sterile fashion.  After appropriate anitbiotics were confirmed, a time-out was confirmed and all facts were verified.  0.25% Marcaine was used to infiltrate the umbilical area. A 11-blade was used to cut down the skin and blunt dissection was used to get the anterior fashion.  The anterior fascia was incised approximately 1 cm and the muscles were retracted laterally. Blunt dissection was then used to create a space in the preperitoneal area. At this time a 10 mm camera was then introduced into the space and advanced the pubic tubercle and a 12 mm trocar was placed over this and insufflation was started.  At this time and space was created from medial to laterally the preperitoneal space.  Cooper's ligament was initially cleaned  off.  The hernia sac was identified in the indirect space. Dissection of the hernia sac was undertaken the vas deferens was identified and protected in all parts of the case.  There was a small tear into the hernia sac. A Veress needle right upper quadrant to help evacuate the intraperitoneal air.    Once the hernia sac was taken down to approximately the umbilicus a Bard 3D Max mesh, size: Rachelle Hora, was  introduced into the preperitoneal space.  The mesh was brought over to cover the direct and indirect hernia spaces.  This was anchored into place and secured to Cooper's ligament with 4.65mm staples from a [place] hernia stapler. It was anchored to the anterior abdominal wall with 4.8 mm staples. The hernia sac was seen lying posterior to the mesh. There was no staples placed laterally. The insufflation was evacuated and the peritoneum was seen posterior to the mesh. The trochars were removed. The anterior fascia was reapproximated using #1 Vicryl on a UR- 6.  Intra-abdominal air was evacuated and the Veress needle removed. The skin was reapproximated using 4-0 Monocryl subcuticular fashion the patient was awakened from general anesthesia and taken to recovery in stable condition.   PLAN OF CARE: Discharge to home after PACU  PATIENT DISPOSITION:  PACU - hemodynamically stable.   Delay start of Pharmacological VTE agent (>24hrs) due to surgical blood loss or risk of bleeding: not applicable

## 2018-06-11 NOTE — Discharge Instructions (Signed)
[facility] _______[facility]  INGUINAL HERNIA REPAIR: POST OP INSTRUCTIONS  Always review your discharge instruction sheet given to you by the facility where your surgery was performed. IF YOU HAVE DISABILITY OR FAMILY LEAVE FORMS, YOU MUST BRING THEM TO THE OFFICE FOR PROCESSING.   DO NOT GIVE THEM TO YOUR DOCTOR.  1. A  prescription for pain medication may be given to you upon discharge.  Take your pain medication as prescribed, if needed.  If narcotic pain medicine is not needed, then you may take acetaminophen (Tylenol) or ibuprofen (Advil) as needed. 2. Take your usually prescribed medications unless otherwise directed. If you need a refill on your pain medication, please contact your pharmacy.  They will contact our office to request authorization. Prescriptions will not be filled after 5 pm or on week-ends. 3. You should follow a light diet the first 24 hours after arrival home, such as soup and crackers, etc.  Be sure to include lots of fluids daily.  Resume your normal diet the day after surgery. 4.Most patients will experience some swelling and bruising around the umbilicus or in the groin and scrotum.  Ice packs and reclining will help.  Swelling and bruising can take several days to resolve.  6. It is common to experience some constipation if taking pain medication after surgery.  Increasing fluid intake and taking a stool softener (such as Colace) will usually help or prevent this problem from occurring.  A mild laxative (Milk of Magnesia or Miralax) should be taken according to package directions if there are no bowel movements after 48 hours. 7. Unless discharge instructions indicate otherwise, you may remove your bandages 24-48 hours after surgery, and you may shower at that time.  You may have steri-strips (small skin tapes) in place directly over the incision.  These strips should be left on the skin for 7-10 days.  If your surgeon used skin glue on the incision, you may  shower in 24 hours.  The glue will flake off over the next 2-3 weeks.  Any sutures or staples will be removed at the office during your follow-up visit. 8. ACTIVITIES:  You may resume regular (light) daily activities beginning the next day--such as daily self-care, walking, climbing stairs--gradually increasing activities as tolerated.  You may have sexual intercourse when it is comfortable.  Refrain from any heavy lifting or straining until approved by your doctor.  a.You may drive when you are no longer taking prescription pain medication, you can comfortably wear a seatbelt, and you can safely maneuver your car and apply brakes. b.RETURN TO WORK:   _____________________________________________  9.You should see your doctor in the office for a follow-up appointment approximately 2-3 weeks after your surgery.  Make sure that you call for this appointment within a day or two after you arrive home to insure a convenient appointment time. 10.OTHER INSTRUCTIONS: _________________________    _____________________________________  WHEN TO CALL YOUR DOCTOR: 1. Fever over 101.0 2. Inability to urinate 3. Nausea and/or vomiting 4. Extreme swelling or bruising 5. Continued bleeding from incision. 6. Increased pain, redness, or drainage from the incision  The clinic staff is available to answer your questions during regular business hours.  Please dont hesitate to call and ask to speak to one of the nurses for clinical concerns.  If you have a medical emergency, go to the nearest emergency room or call 911.  A surgeon from [facility] is always on call at the hospital   [address]  [address] [place], [place], [place]  [address] ?  [address], [place], [place]   [address] [phone] ? [phone] ? FAX [phone] Web site: [email or web address]   Post Anesthesia Home Care Instructions  Activity: Get plenty of rest for the remainder of the day. A responsible  individual must stay with you for 24 hours following the procedure.  For the next 24 hours, DO NOT: -Drive a car -Paediatric nurse -Drink alcoholic beverages -Take any medication unless instructed by your physician -Make any legal decisions or sign important papers.  Meals: Start with liquid foods such as gelatin or soup. Progress to regular foods as tolerated. Avoid greasy, spicy, heavy foods. If nausea and/or vomiting occur, drink only clear liquids until the nausea and/or vomiting subsides. Call your physician if vomiting continues.  Special Instructions/Symptoms: Your throat may feel dry or sore from the anesthesia or the breathing tube placed in your throat during surgery. If this causes discomfort, gargle with warm salt water. The discomfort should disappear within 24 hours.  If you had a scopolamine patch placed behind your ear for the management of post- operative nausea and/or vomiting:  1. The medication in the patch is effective for 72 hours, after which it should be removed.  Wrap patch in a tissue and discard in the trash. Wash hands thoroughly with soap and water. 2. You may remove the patch earlier than 72 hours if you experience unpleasant side effects which may include dry mouth, dizziness or visual disturbances. 3. Avoid touching the patch. Wash your hands with soap and water after contact with the patch.

## 2018-06-11 NOTE — Anesthesia Postprocedure Evaluation (Signed)
Anesthesia Post Note  Patient: Troy Marsh  Procedure(s) Performed: LAPAROSCOPIC RIGHT INGUINAL HERNIA WITH MESH (Right Groin) INSERTION OF MESH (Right Groin)     Patient location during evaluation: PACU Anesthesia Type: General Level of consciousness: awake and alert Pain management: pain level controlled Vital Signs Assessment: post-procedure vital signs reviewed and stable Respiratory status: spontaneous breathing, nonlabored ventilation, respiratory function stable and patient connected to nasal cannula oxygen Cardiovascular status: blood pressure returned to baseline and stable Postop Assessment: no apparent nausea or vomiting Anesthetic complications: no    Last Vitals:  Vitals:   06/11/18 0915 06/11/18 0926  BP: (!) 142/98 (!) 165/85  Pulse: 84 79  Resp: 20 18  Temp: 36.7 C   SpO2: 99% 98%    Last Pain:  Vitals:   06/11/18 0926  TempSrc:   PainSc: 2                  Alban Marucci COKER

## 2018-06-11 NOTE — Anesthesia Preprocedure Evaluation (Addendum)
Anesthesia Evaluation  Patient identified by MRN, date of birth, ID band Patient awake    Reviewed: Allergy & Precautions, NPO status , Patient's Chart, lab work & pertinent test results  Airway Mallampati: II   Neck ROM: Limited    Dental  (+) Teeth Intact, Dental Advisory Given   Pulmonary former smoker,    breath sounds clear to auscultation       Cardiovascular hypertension,  Rhythm:Regular Rate:Normal     Neuro/Psych    GI/Hepatic   Endo/Other    Renal/GU      Musculoskeletal   Abdominal   Peds  Hematology   Anesthesia Other Findings   Reproductive/Obstetrics                            Anesthesia Physical Anesthesia Plan  ASA: III  Anesthesia Plan: General   Post-op Pain Management:    Induction: Intravenous  PONV Risk Score and Plan: Ondansetron and Dexamethasone  Airway Management Planned: Video Laryngoscope Planned and Oral ETT  Additional Equipment:   Intra-op Plan:   Post-operative Plan: Extubation in OR  Informed Consent: I have reviewed the patients History and Physical, chart, labs and discussed the procedure including the risks, benefits and alternatives for the proposed anesthesia with the patient or authorized representative who has indicated his/her understanding and acceptance.   Dental advisory given  Plan Discussed with: CRNA and Anesthesiologist  Anesthesia Plan Comments: (Plan GA with glide scope intubation. Will keep C-spine neutral, continue C-collar throughout surgery.)        Anesthesia Quick Evaluation

## 2018-06-12 ENCOUNTER — Encounter (HOSPITAL_COMMUNITY): Payer: Self-pay | Admitting: General Surgery

## 2018-06-21 NOTE — Discharge Summary (Signed)
Patient ID: Troy Marsh MRN: [id number] DOB/AGE: 01/24/1951 67 y.o.  Admit date: 05/29/2018 Discharge date: 05/30/2018  Admission Diagnoses:  Active Problems:   Radiculopathy   Discharge Diagnoses:  Same  Past Medical History:  Diagnosis Date  . Arthritis   . BPH (benign prostatic hyperplasia)    [facility]  DR. Elie Goody  . Cancer ([place])    Skin cancer- basal and squamous  . Constipation due to pain medication   . HIV disease ([place]) 1985   last CD4 10/02/12 was 277  . Hypertension   . Hypogonadism male     Surgeries: Procedure(s): POSTERIOR CERVICAL DECOMPRESSION FUSION CERVICAL 4 - CERVICAL 7 WITH INSTRUMENTATION AND ALLOGRAFT on 05/29/2018   Consultants: None  Discharged Condition: Improved  Hospital Course: Troy Marsh is an 67 y.o. male who was admitted 05/29/2018 for operative treatment of radiculopathy. Patient has severe unremitting pain that affects sleep, daily activities, and work/hobbies. After pre-op clearance the patient was taken to the operating room on 05/29/2018 and underwent  Procedure(s): POSTERIOR CERVICAL DECOMPRESSION FUSION CERVICAL 4 - CERVICAL 7 WITH INSTRUMENTATION AND ALLOGRAFT.    Patient was given perioperative antibiotics:  Anti-infectives (From admission, onward)   Start     Dose/Rate Route Frequency Ordered Stop   05/31/18 0600  ceFAZolin (ANCEF) IVPB 2g/100 mL premix  Status:  Discontinued     2 g 200 mL/hr over 30 Minutes Intravenous To [facility] 05/30/18 1204 05/30/18 1519   05/29/18 1500  emtricitabine-rilpivir-tenofovir AF (ODEFSEY) 200-25-25 MG per tablet 1 tablet  Status:  Discontinued     1 tablet Oral Daily 05/29/18 1456 05/30/18 1519   05/29/18 1500  ceFAZolin (ANCEF) IVPB 2g/100 mL premix     2 g 200 mL/hr over 30 Minutes Intravenous Every 8 hours 05/29/18 1456 05/30/18 0820   05/29/18 0730  ceFAZolin (ANCEF) IVPB 2g/100 mL premix     2 g 200 mL/hr over 30 Minutes Intravenous On call to O.R.  05/29/18 4481 05/29/18 0847   05/29/18 0723  ceFAZolin (ANCEF) 2-4 GM/100ML-% IVPB    Note to Pharmacy:  Debbe Bales, Meredit: cabinet override      05/29/18 0723 05/29/18 0847       Patient was given sequential compression devices, early ambulation to prevent DVT.  Patient benefited maximally from hospital stay and there were no complications.    Recent vital signs: BP 110/79 (BP Location: Right Arm)   Pulse 71   Temp (!) 97.4 F (36.3 C) (Oral)   Resp 19   Ht 5\' 10"  (1.778 m)   Wt 64.5 kg   SpO2 100%   BMI 20.39 kg/m    Discharge Medications:   Allergies as of 05/30/2018   No Known Allergies     Medication List    TAKE these medications   alfuzosin 10 MG 24 hr tablet Commonly known as:  UROXATRAL Take 10 mg by mouth at bedtime.   aspirin 81 MG tablet Take 81 mg by mouth daily.   atorvastatin 40 MG tablet Commonly known as:  LIPITOR Take 40 mg by mouth at bedtime.   emtricitabine-rilpivir-tenofovir AF 200-25-25 MG Tabs tablet Commonly known as:  ODEFSEY Take 1 tablet by mouth daily.   gabapentin 100 MG capsule Commonly known as:  NEURONTIN Take 1 capsule (100 mg total) by mouth 3 (three) times daily. What changed:  when to take this   hydrochlorothiazide 50 MG tablet Commonly known as:  HYDRODIURIL TAKE 1 TABLET BY MOUTH  DAILY   hydrocortisone  25 MG suppository Commonly known as:  ANUSOL-HC UNWRAP AND INSERT 1  SUPPOSITORY RECTALLY AT  BEDTIME What changed:    how much to take  how to take this  when to take this  additional instructions   linaclotide 72 MCG capsule Commonly known as:  LINZESS Take 1 capsule (72 mcg total) by mouth daily before breakfast.   Melatonin 10 MG Tabs Take 10 mg by mouth at bedtime as needed (for sleep).   multivitamin with minerals Tabs tablet Take 1 tablet by mouth daily. COMPLETE MULTIVITAMIN MEN'S 50+   MYRBETRIQ 25 MG Tb24 tablet Generic drug:  mirabegron ER Take 25 mg by mouth at bedtime.     polyethylene glycol powder powder Commonly known as:  GLYCOLAX/MIRALAX Take 17 g by mouth daily as needed for moderate constipation.   potassium chloride 10 MEQ tablet Commonly known as:  K-DUR,KLOR-CON Take 10 mEq by mouth daily.   RESTASIS 0.05 % ophthalmic emulsion Generic drug:  cycloSPORINE Place 1 drop into both eyes 2 (two) times daily.   testosterone enanthate 200 MG/ML injection Commonly known as:  DELATESTRYL INJECT 1 ML INTRAMUSCULARLY EVERY 14 DAYS . DISCARD 28 DAYS AFTER INITIAL USE. What changed:  See the new instructions.   ZYRTEC-D ALLERGY & CONGESTION 5-120 MG tablet Generic drug:  cetirizine-pseudoephedrine Take 1 tablet by mouth daily as needed for allergies.       Diagnostic Studies: Dg Cervical Spine 1 View  Result Date: 05/29/2018 CLINICAL DATA:  Location for posterior decompression and fusion EXAM: DG CERVICAL SPINE - 1 VIEW COMPARISON:  CT from 05/06/2018 FINDINGS: Fusion is noted from C4-C7 with anterior fixation stable from the prior CT examination. Some lucency is noted again surrounding the screws at C7 suggestive of loosening. Surgical instrument is noted in the posterior soft tissues overlying the C6 spinous process. IMPRESSION: Intraoperative localization at C6 posteriorly. Electronically Signed   By: Inez Catalina M.D.   On: 05/29/2018 10:52   Dg Cervical Spine 2-3 Views  Result Date: 05/29/2018 CLINICAL DATA:  Cervical fusion EXAM: DG C-ARM 61-120 MIN; CERVICAL SPINE - 2-3 VIEW COMPARISON:  Earlier same day FINDINGS: Previous ACDF C4-C7. 2 C-arm images show the addition of posterior fusion from C4-C7 with posterior element screws at C4, C5 and C7. IMPRESSION: Posterior fusion performed from C4-C7.  Pre-existing ACDF C4-C7. Electronically Signed   By: Nelson Chimes M.D.   On: 05/29/2018 12:54   Dg C-arm 1-60 Min  Result Date: 05/29/2018 CLINICAL DATA:  Cervical fusion EXAM: DG C-ARM 61-120 MIN; CERVICAL SPINE - 2-3 VIEW COMPARISON:  Earlier same  day FINDINGS: Previous ACDF C4-C7. 2 C-arm images show the addition of posterior fusion from C4-C7 with posterior element screws at C4, C5 and C7. IMPRESSION: Posterior fusion performed from C4-C7.  Pre-existing ACDF C4-C7. Electronically Signed   By: Nelson Chimes M.D.   On: 05/29/2018 12:54    Disposition:    POD #1 s/p PCF, doing well  - encourage ambulation - Percocet for pain, Valium for muscle spasms - d/c home today with f/u in 2 weeks -Written scripts for pain signed and in chart -D/C instructions sheet printed and in chart -D/C today  -F/U in office 2 weeks   Signed: Lennie Muckle Ciena Sampley 06/21/2018, 8:03 AM

## 2018-07-03 ENCOUNTER — Encounter: Payer: Self-pay | Admitting: Physician Assistant

## 2018-07-16 ENCOUNTER — Other Ambulatory Visit: Payer: Self-pay | Admitting: Orthopedic Surgery

## 2018-07-16 DIAGNOSIS — M545 Low back pain, unspecified: Secondary | ICD-10-CM

## 2018-07-16 DIAGNOSIS — G8929 Other chronic pain: Secondary | ICD-10-CM

## 2018-07-18 ENCOUNTER — Other Ambulatory Visit: Payer: Self-pay | Admitting: Internal Medicine

## 2018-07-18 NOTE — Telephone Encounter (Signed)
[place] Controlled Database Checked Last filled: 07/03/18 # 5 LOV w/you: 04/24/2018 Next appt w/you: 10/28/18

## 2018-07-24 ENCOUNTER — Ambulatory Visit: Payer: Medicare Other | Admitting: Physician Assistant

## 2018-07-24 ENCOUNTER — Ambulatory Visit (INDEPENDENT_AMBULATORY_CARE_PROVIDER_SITE_OTHER): Payer: Medicare Other | Admitting: Gastroenterology

## 2018-07-24 ENCOUNTER — Encounter: Payer: Self-pay | Admitting: Gastroenterology

## 2018-07-24 VITALS — BP 124/74 | HR 71 | Ht 70.0 in | Wt 142.5 lb

## 2018-07-24 DIAGNOSIS — K625 Hemorrhage of anus and rectum: Secondary | ICD-10-CM

## 2018-07-24 MED ORDER — HYDROCORTISONE ACETATE 25 MG RE SUPP: RECTAL | 0 refills | Status: DC

## 2018-07-24 MED ORDER — HYDROCORTISONE ACETATE 25 MG RE SUPP: RECTAL | 1 refills | Status: DC

## 2018-07-24 MED ORDER — SUPREP BOWEL PREP KIT 17.5-3.13-1.6 GM/177ML PO SOLN: 1.0000 | ORAL | 0 refills | Status: DC

## 2018-07-24 NOTE — Patient Instructions (Addendum)
If you are age 69 or older, your body mass index should be between 23-30. Your Body mass index is 20.45 kg/m. If this is out of the aforementioned range listed, please consider follow up with your Primary Care Provider.  If you are age 62 or younger, your body mass index should be between 19-25. Your Body mass index is 20.45 kg/m. If this is out of the aformentioned range listed, please consider follow up with your Primary Care Provider.   You have been scheduled for a colonoscopy. Please follow written instructions given to you at your visit today.  Please pick up your prep supplies at the pharmacy within the next 1-3 days. If you use inhalers (even only as needed), please bring them with you on the day of your procedure. Your physician has requested that you go to www.startemmi.com and enter the access code given to you at your visit today. This web site gives a general overview about your procedure. However, you should still follow specific instructions given to you by our office regarding your preparation for the procedure.  We have sent the following medications to your pharmacy for you to pick up at your convenience: Anucort twice daily.   Continue Miralax as needed   Two days before your procedure: Mix 3 packs (or capfuls) of Miralax in 48 ounces of clear liquid and drink at 6pm.  Thank you,  Dr. Jackquline Denmark

## 2018-07-24 NOTE — Progress Notes (Signed)
Chief Complaint: FU  Referring Provider:  Cassandria Anger, MD      ASSESSMENT AND PLAN;   #1.  Rectal bleeding - D/d hoids, AVMs, colitis, polyps, stercoral ulcers etc, r/o colonic neoplasms or IBD. H/O Anal fissure and internal hemorrhoids.  #2. H/O constipation (uses hydrocodone, Celebrex), neg colon 03/2014, 07/2001.  Plan: - Minimize use of pain medications - Anacort 2.5% suppostories bid x 14 days. - Miralax 17g po qd. - Linzess samples 75 mcg 1 p.o. once a day #30, 11 refills. - Colon after 2 day prep.  I explained the risks and benefits.  This will be scheduled in coming days. - FU in 6 months.  Earlier, if still with problems.   HPI:    Troy Marsh is a 68 y.o. male   S/P neck Sx ACD  and right inguinal hernia repair For follow-up visit Doing very well Still has constipation-qod better with MiraLAX and Linzess Has been having rectal bleeding intermittently. Has been using Anacort 2.5% suppositories on as-needed basis. Has not been drinking enough water. No abdominal pain Does use hydrocodone every day for back pain Very Concerned about colon cancer.  Married to [employer] (patient of ours)  Past Medical History:  Diagnosis Date  . Arthritis   . BPH (benign prostatic hyperplasia)    [facility]  DR. Elie Goody  . Cancer ([place])    Skin cancer- basal and squamous  . Constipation due to pain medication   . HIV disease ([place]) 1985   last CD4 10/02/12 was 277  . Hypertension   . Hypogonadism male     Past Surgical History:  Procedure Laterality Date  . ANTERIOR CERVICAL DECOMP/DISCECTOMY FUSION Bilateral 11/22/2017   Procedure: ANTERIOR CERVICAL DECOMPRESSION FUSION CERVICAL 4-5, CERVICAL 5-6, CERVICAL 6-7 WITH INSTRUMENTATION AND ALLOGRAFT TIME      /   REQUESTED 4 HOURS;  Surgeon: Phylliss Bob, MD;  Location: [place];  Service: Orthopedics;  Laterality: Bilateral;  . BACK SURGERY    . COLONOSCOPY W/ POLYPECTOMY    . EYE SURGERY Bilateral      Lasik, Cataract  . [place]  . INGUINAL HERNIA REPAIR Right 06/11/2018   Procedure: LAPAROSCOPIC RIGHT INGUINAL HERNIA WITH MESH;  Surgeon: Ralene Ok, MD;  Location: [place];  Service: General;  Laterality: Right;  . INSERTION OF MESH Right 06/11/2018   Procedure: INSERTION OF MESH;  Surgeon: Ralene Ok, MD;  Location: [place];  Service: General;  Laterality: Right;  . KNEE ARTHROSCOPY Right 2009  . KNEE ARTHROSCOPY Left 2011  . neck surgery  1984  . POSTERIOR CERVICAL FUSION/FORAMINOTOMY N/A 05/29/2018   Procedure: POSTERIOR CERVICAL DECOMPRESSION FUSION CERVICAL 4 - CERVICAL 7 WITH INSTRUMENTATION AND ALLOGRAFT;  Surgeon: Phylliss Bob, MD;  Location: [place];  Service: Orthopedics;  Laterality: N/A;    Family History  Problem Relation Age of Onset  . Coronary artery disease Father   . Heart attack Father   . Hypertension Father   . Hyperlipidemia Father   . Cancer Father        prostate  . Diabetes Neg Hx   . Colon cancer Neg Hx     Social History   Tobacco Use  . Smoking status: Former Smoker    Packs/day: 2.00    Years: 35.00    Pack years: 70.00    Types: Cigarettes    Last attempt to quit: 02/10/2004    Years since quitting: 14.4  . Smokeless tobacco: Never Used  Substance Use Topics  . Alcohol use: No    Comment: quit 2004  . Drug use: No    Current Outpatient Medications  Medication Sig Dispense Refill  . alfuzosin (UROXATRAL) 10 MG 24 hr tablet Take 10 mg by mouth at bedtime.     [name] aspirin 81 MG tablet Take 81 mg by mouth daily.      [name] atorvastatin (LIPITOR) 40 MG tablet Take 40 mg by mouth at bedtime.     . celecoxib (CELEBREX) 200 MG capsule TAKE 1 CAPSULE BY MOUTH TWO TIMES DAILY 180 capsule 1  . cetirizine-pseudoephedrine (ZYRTEC-D ALLERGY & CONGESTION) 5-120 MG tablet Take 1 tablet by mouth daily as needed for allergies.     [name] emtricitabine-rilpivir-tenofovir AF (ODEFSEY) 200-25-25 MG TABS tablet Take 1 tablet by mouth daily.     [name] gabapentin (NEURONTIN) 100 MG capsule Take 1 capsule (100 mg total) by mouth 3 (three) times daily. (Patient taking differently: Take 100 mg by mouth 2 (two) times daily. ) 42 capsule 0  . hydrochlorothiazide (HYDRODIURIL) 50 MG tablet TAKE 1 TABLET BY MOUTH  DAILY (Patient taking differently: Take 50 mg by mouth daily. ) 90 tablet 2  . hydrocortisone (ANUCORT-HC) 25 MG suppository UNWRAP AND INSERT 1  SUPPOSITORY RECTALLY AT  BEDTIME (Patient taking differently: Place 25 mg rectally at bedtime. ) 96 suppository 1  . linaclotide (LINZESS) 72 MCG capsule Take 1 capsule (72 mcg total) by mouth daily before breakfast. 90 capsule 3  . Melatonin 10 MG TABS Take 10 mg by mouth at bedtime as needed (for sleep).     . mirabegron ER (MYRBETRIQ) 25 MG TB24 tablet Take 25 mg by mouth at bedtime.     . montelukast (SINGULAIR) 10 MG tablet Take 1 tablet (10 mg total) by mouth daily. 90 tablet 1  . Multiple Vitamin (MULTIVITAMIN WITH MINERALS) TABS tablet Take 1 tablet by mouth daily. COMPLETE MULTIVITAMIN MEN'S 50+    . polyethylene glycol powder (GLYCOLAX/MIRALAX) powder Take 17 g by mouth daily as needed for moderate constipation.     . potassium chloride (K-DUR,KLOR-CON) 10 MEQ tablet Take 10 mEq by mouth daily.    . RESTASIS 0.05 % ophthalmic emulsion Place 1 drop into both eyes 2 (two) times daily.    [name] testosterone enanthate (DELATESTRYL) 200 MG/ML injection INJECT 1 ML INTRAMUSCULARLY EVERY 14 DAYS . DISCARD 28 DAYS AFTER INITIAL USE. 5 mL 1  . traMADol (ULTRAM) 50 MG tablet Take 1 tablet (50 mg total) by mouth every 6 (six) hours as needed. 20 tablet 0   No current facility-administered medications for this visit.     No Known Allergies  Review of Systems:  Negative except for HPI.    Physical Exam:    BP 124/74   Pulse 71   Ht 5\' 10"  (1.778 m)   Wt 142 lb 8 oz (64.6 kg)   BMI 20.45 kg/m  Filed Weights   07/24/18 1107  Weight: 142 lb 8 oz (64.6 kg)   Constitutional:  Well-developed,  in no acute distress. Psychiatric: Normal mood and affect. Behavior is normal. HEENT: Pupils normal.  Conjunctivae are normal. No scleral icterus. Neck supple.  Cardiovascular: Normal rate, regular rhythm. No edema Pulmonary/chest: Effort normal and breath sounds normal. No wheezing, rales or rhonchi. Abdominal: Soft, nondistended. Nontender. Bowel sounds active throughout. There are no masses palpable. No hepatomegaly. Rectal: to Be performed at the time of colonoscopy Neurological: Alert and oriented to person place and time. Skin: Skin is warm  and dry. No rashes noted.  Data Reviewed: I have personally reviewed following labs and imaging studies  CBC: CBC Latest Ref Rng & Units 06/03/2018 05/29/2018 04/24/2018  WBC 4.0 - 10.5 K/uL 5.4 5.1 4.8  Hemoglobin 13.0 - 17.0 g/dL 14.5 16.8 17.2(H)  Hematocrit 39.0 - 52.0 % 42.6 48.2 48.6  Platelets 150 - 400 K/uL 141(L) 156 189.0    CMP: CMP Latest Ref Rng & Units 06/03/2018 05/29/2018 04/24/2018  Glucose 70 - 99 mg/dL 96 96 96  BUN 8 - 23 mg/dL 14 10 13   Creatinine 0.61 - 1.24 mg/dL 1.05 1.27(H) 1.21  Sodium 135 - 145 mmol/L 134(L) 136 135  Potassium 3.5 - 5.1 mmol/L 4.4 3.2(L) 3.8  Chloride 98 - 111 mmol/L 98 97(L) 94(L)  CO2 22 - 32 mmol/L 31 34(H) 35(H)  Calcium 8.9 - 10.3 mg/dL 9.2 9.0 9.4  Total Protein 6.5 - 8.1 g/dL - 5.7(L) 6.1  Total Bilirubin 0.3 - 1.2 mg/dL - 1.0 0.8  Alkaline Phos 38 - 126 U/L - 48 52  AST 15 - 41 U/L - 20 19  ALT 0 - 44 U/L - 19 15  I spent 15 minutes of face-to-face time with the patient. Greater than 50% of the time was spent counseling and coordinating care.     Carmell Austria, MD 07/24/2018, 11:23 AM  Cc: Cassandria Anger, MD

## 2018-07-24 NOTE — Addendum Note (Signed)
Addended by: Karena Addison on: 07/24/2018 12:20 PM   Modules accepted: Orders

## 2018-07-29 ENCOUNTER — Ambulatory Visit
Admission: RE | Admit: 2018-07-29 | Discharge: 2018-07-29 | Disposition: A | Discharge status: Home / Self Care | Payer: Medicare Other | Source: Ambulatory Visit | Attending: Orthopedic Surgery | Admitting: Orthopedic Surgery

## 2018-07-29 DIAGNOSIS — M545 Low back pain, unspecified: Secondary | ICD-10-CM

## 2018-07-29 DIAGNOSIS — G8929 Other chronic pain: Secondary | ICD-10-CM

## 2018-07-29 MED ORDER — IOPAMIDOL (ISOVUE-M 200) INJECTION 41%
1.0000 mL | Freq: Once | INTRAMUSCULAR | Status: AC
  Administered 2018-07-29: 1 mL via EPIDURAL

## 2018-07-29 MED ORDER — METHYLPREDNISOLONE ACETATE 40 MG/ML INJ SUSP (RADIOLOG
120.0000 mg | Freq: Once | INTRAMUSCULAR | Status: AC
  Administered 2018-07-29: 120 mg via EPIDURAL

## 2018-08-21 ENCOUNTER — Encounter: Payer: Self-pay | Admitting: Gastroenterology

## 2018-08-29 ENCOUNTER — Other Ambulatory Visit: Payer: Self-pay | Admitting: Internal Medicine

## 2018-08-30 ENCOUNTER — Other Ambulatory Visit: Payer: Self-pay | Admitting: Internal Medicine

## 2018-09-04 ENCOUNTER — Encounter: Payer: Medicare Other | Admitting: Gastroenterology

## 2018-09-09 ENCOUNTER — Other Ambulatory Visit: Payer: Self-pay | Admitting: Orthopedic Surgery

## 2018-09-09 DIAGNOSIS — G8929 Other chronic pain: Secondary | ICD-10-CM

## 2018-09-09 DIAGNOSIS — M545 Low back pain: Principal | ICD-10-CM

## 2018-09-20 ENCOUNTER — Encounter: Payer: Self-pay | Admitting: Gastroenterology

## 2018-09-20 ENCOUNTER — Ambulatory Visit (AMBULATORY_SURGERY_CENTER): Payer: Medicare Other | Admitting: Gastroenterology

## 2018-09-20 VITALS — BP 111/68 | HR 64 | Temp 97.8°F | Resp 11 | Ht 70.0 in | Wt 142.0 lb

## 2018-09-20 DIAGNOSIS — K625 Hemorrhage of anus and rectum: Secondary | ICD-10-CM | POA: Diagnosis not present

## 2018-09-20 DIAGNOSIS — K648 Other hemorrhoids: Secondary | ICD-10-CM

## 2018-09-20 MED ORDER — HYDROCORTISONE 2.5 % RE CREA: 1.0000 "application " | TOPICAL_CREAM | Freq: Two times a day (BID) | RECTAL | 4 refills | Status: AC

## 2018-09-20 MED ORDER — SODIUM CHLORIDE 0.9 % IV SOLN: 500.0000 mL | Freq: Once | INTRAVENOUS | Status: DC

## 2018-09-20 NOTE — Patient Instructions (Signed)
YOU HAD AN ENDOSCOPIC PROCEDURE TODAY AT [facility]:   Refer to the procedure report that was given to you for any specific questions about what was found during the examination.  If the procedure report does not answer your questions, please call your gastroenterologist to clarify.  If you requested that your care partner not be given the details of your procedure findings, then the procedure report has been included in a sealed envelope for you to review at your convenience later.  YOU SHOULD EXPECT: Some feelings of bloating in the abdomen. Passage of more gas than usual.  Walking can help get rid of the air that was put into your GI tract during the procedure and reduce the bloating. If you had a lower endoscopy (such as a colonoscopy or flexible sigmoidoscopy) you may notice spotting of blood in your stool or on the toilet paper. If you underwent a bowel prep for your procedure, you may not have a normal bowel movement for a few days.  Please Note:  You might notice some irritation and congestion in your nose or some drainage.  This is from the oxygen used during your procedure.  There is no need for concern and it should clear up in a day or so.  SYMPTOMS TO REPORT IMMEDIATELY:   Following lower endoscopy (colonoscopy or flexible sigmoidoscopy):  Excessive amounts of blood in the stool  Significant tenderness or worsening of abdominal pains  Swelling of the abdomen that is new, acute  Fever of 100F or higher   Following upper endoscopy (EGD)  Vomiting of blood or coffee ground material  New chest pain or pain under the shoulder blades  Painful or persistently difficult swallowing  New shortness of breath  Fever of 100F or higher  Black, tarry-looking stools  For urgent or emergent issues, a gastroenterologist can be reached at any hour by calling [phone].   DIET:  We do recommend a small meal at first, but then you may proceed to your regular diet.  Drink  plenty of fluids but you should avoid alcoholic beverages for 24 hours.  ACTIVITY:  You should plan to take it easy for the rest of today and you should NOT DRIVE or use heavy machinery until tomorrow (because of the sedation medicines used during the test).    FOLLOW UP: Our staff will call the number listed on your records the next business day following your procedure to check on you and address any questions or concerns that you may have regarding the information given to you following your procedure. If we do not reach you, we will leave a message.  However, if you are feeling well and you are not experiencing any problems, there is no need to return our call.  We will assume that you have returned to your regular daily activities without incident.  If any biopsies were taken you will be contacted by phone or by letter within the next 1-3 weeks.  Please call us at [phone] if you have not heard about the biopsies in 3 weeks.    SIGNATURES/CONFIDENTIALITY: You and/or your care partner have signed paperwork which will be entered into your electronic medical record.  These signatures attest to the fact that that the information above on your After Visit Summary has been reviewed and is understood.  Full responsibility of the confidentiality of this discharge information lies with you and/or your care-partner.YOU HAD AN ENDOSCOPIC PROCEDURE TODAY AT [facility]:  Refer to the procedure report that was given to you for any specific questions about what was found during the examination.  If the procedure report does not answer your questions, please call your gastroenterologist to clarify.  If you requested that your care partner not be given the details of your procedure findings, then the procedure report has been included in a sealed envelope for you to review at your convenience later.  YOU SHOULD EXPECT: Some feelings of bloating in the abdomen. Passage of more gas than  usual.  Walking can help get rid of the air that was put into your GI tract during the procedure and reduce the bloating. If you had a lower endoscopy (such as a colonoscopy or flexible sigmoidoscopy) you may notice spotting of blood in your stool or on the toilet paper. If you underwent a bowel prep for your procedure, you may not have a normal bowel movement for a few days.  Please Note:  You might notice some irritation and congestion in your nose or some drainage.  This is from the oxygen used during your procedure.  There is no need for concern and it should clear up in a day or so.  SYMPTOMS TO REPORT IMMEDIATELY:   Following lower endoscopy (colonoscopy or flexible sigmoidoscopy):  Excessive amounts of blood in the stool  Significant tenderness or worsening of abdominal pains  Swelling of the abdomen that is new, acute  Fever of 100F or higher   For urgent or emergent issues, a gastroenterologist can be reached at any hour by calling [phone].   DIET:  We do recommend a small meal at first, but then you may proceed to your regular diet.  Drink plenty of fluids but you should avoid alcoholic beverages for 24 hours.  ACTIVITY:  You should plan to take it easy for the rest of today and you should NOT DRIVE or use heavy machinery until tomorrow (because of the sedation medicines used during the test).    FOLLOW UP: Our staff will call the number listed on your records the next business day following your procedure to check on you and address any questions or concerns that you may have regarding the information given to you following your procedure. If we do not reach you, we will leave a message.  However, if you are feeling well and you are not experiencing any problems, there is no need to return our call.  We will assume that you have returned to your regular daily activities without incident.  If any biopsies were taken you will be contacted by phone or by letter within the next  1-3 weeks.  Please call us at [phone] if you have not heard about the biopsies in 3 weeks.    SIGNATURES/CONFIDENTIALITY: You and/or your care partner have signed paperwork which will be entered into your electronic medical record.  These signatures attest to the fact that that the information above on your After Visit Summary has been reviewed and is understood.  Full responsibility of the confidentiality of this discharge information lies with you and/or your care-partner.  High fiber diet information given.  HC Cream 2.5% twice daily for 10 days.  Recall colonoscopy 5 years-2025

## 2018-09-20 NOTE — Progress Notes (Signed)
A/ox3, pleased with MAC, report to RN 

## 2018-09-20 NOTE — Op Note (Signed)
[place] Patient Name: Troy Marsh Procedure Date: 09/20/2018 8:20 AM MRN: [id number] Endoscopist: Jackquline Denmark , MD Age: 68 Referring MD:  Date of Birth: 02/18/1951 Gender: Male Account #: [id number] Procedure:                Colonoscopy Indications:              Rectal bleeding Medicines:                Monitored Anesthesia Care Procedure:                Pre-Anesthesia Assessment:                           - Prior to the procedure, a History and Physical                            was performed, and patient medications and                            allergies were reviewed. The patient's tolerance of                            previous anesthesia was also reviewed. The risks                            and benefits of the procedure and the sedation                            options and risks were discussed with the patient.                            All questions were answered, and informed consent                            was obtained. Prior Anticoagulants: The patient has                            taken no previous anticoagulant or antiplatelet                            agents. ASA Grade Assessment: II - A patient with                            mild systemic disease. After reviewing the risks                            and benefits, the patient was deemed in                            satisfactory condition to undergo the procedure.                           After obtaining informed consent, the colonoscope  was passed under direct vision. Throughout the                            procedure, the patient's blood pressure, pulse, and                            oxygen saturations were monitored continuously. The                            Colonoscope was introduced through the anus and                            advanced to the 1 cm into the ileum. The                            colonoscopy was performed without difficulty. The                      patient tolerated the procedure well. The quality                            of the bowel preparation was adequate to identify                            polyps 6 mm and larger in size. The ileocecal                            valve, appendiceal orifice, and rectum were                            photographed. Scope In: 8:24:33 AM Scope Out: 8:39:56 AM Scope Withdrawal Time: 0 hours 8 minutes 36 seconds  Total Procedure Duration: 0 hours 15 minutes 23 seconds  Findings:                 Non-bleeding internal hemorrhoids were found during                            retroflexion and during perianal exam. The                            hemorrhoids were small.                           The exam was otherwise without abnormality.                           The terminal ileum appeared normal. Complications:            No immediate complications. Estimated Blood Loss:     Estimated blood loss: none. Impression:               - Non-bleeding internal hemorrhoids.                           - Otherwise grossly normal colonoscopy. Recommendation:           -  Patient has a contact number available for                            emergencies. The signs and symptoms of potential                            delayed complications were discussed with the                            patient. Return to normal activities tomorrow.                            Written discharge instructions were provided to the                            patient.                           - High fiber diet.                           - Use HC Cream 2.5%: Apply externally BID for 10                            days. 4 refills.                           - Recommend repeating colonoscopy in 5 years (due                            to quality of preparation, H/O HIV). Earlier, if                            any new problems. Jackquline Denmark, MD 09/20/2018 8:46:13 AM This report has been signed electronically.

## 2018-09-23 ENCOUNTER — Telehealth: Payer: Self-pay

## 2018-09-23 NOTE — Telephone Encounter (Signed)
  Follow up Call-  Call back number 09/20/2018  Post procedure Call Back phone  # [phone]  Permission to leave phone message Yes  Some recent data might be hidden     Patient questions:  Do you have a fever, pain , or abdominal swelling? No. Pain Score  0 *  Have you tolerated food without any problems? Yes.    Have you been able to return to your normal activities? Yes.    Do you have any questions about your discharge instructions: Diet   No. Medications  No. Follow up visit  No.  Do you have questions or concerns about your Care? No.  Actions: * If pain score is 4 or above: No action needed, pain <4.

## 2018-10-09 ENCOUNTER — Other Ambulatory Visit: Payer: Medicare Other

## 2018-10-22 ENCOUNTER — Encounter: Payer: Self-pay | Admitting: Internal Medicine

## 2018-10-22 ENCOUNTER — Ambulatory Visit (INDEPENDENT_AMBULATORY_CARE_PROVIDER_SITE_OTHER): Payer: Medicare Other | Admitting: Internal Medicine

## 2018-10-22 DIAGNOSIS — G8929 Other chronic pain: Secondary | ICD-10-CM

## 2018-10-22 DIAGNOSIS — J309 Allergic rhinitis, unspecified: Secondary | ICD-10-CM

## 2018-10-22 DIAGNOSIS — I10 Essential (primary) hypertension: Secondary | ICD-10-CM | POA: Diagnosis not present

## 2018-10-22 DIAGNOSIS — E291 Testicular hypofunction: Secondary | ICD-10-CM | POA: Diagnosis not present

## 2018-10-22 DIAGNOSIS — M5416 Radiculopathy, lumbar region: Secondary | ICD-10-CM

## 2018-10-22 DIAGNOSIS — M545 Low back pain: Secondary | ICD-10-CM

## 2018-10-22 NOTE — Progress Notes (Signed)
Virtual Visit via Telephone Note  I connected with Troy Marsh on 10/22/18 at  8:30 AM EDT by telephone and verified that I am speaking with the correct person using two identifiers.   I discussed the limitations, risks, security and privacy concerns of performing an evaluation and management service by telephone and the availability of in person appointments. I also discussed with the patient that there may be a patient responsible charge related to this service. The patient expressed understanding and agreed to proceed.   History of Present Illness: Follow-up visit: Hypogonadism, low back pain with sciatica, hypertension.  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  He looks well Assessment and Plan:  See plan.  COVID-19 prevention discussed Follow Up Instructions:    I discussed the assessment and treatment plan with the patient. The patient was provided an opportunity to ask questions and all were answered. The patient agreed with the plan and demonstrated an understanding of the instructions.   The patient was advised to call back or seek an in-person evaluation if the symptoms worsen or if the condition fails to improve as anticipated.  I provided 20 minutes of non-face-to-face time during this encounter.   Troy Kehr, MD

## 2018-10-22 NOTE — Assessment & Plan Note (Signed)
Dr Lynann Bologna is retiring - I'll need to write his Rx

## 2018-10-22 NOTE — Assessment & Plan Note (Signed)
- 

## 2018-10-22 NOTE — Assessment & Plan Note (Signed)
HCTZ Kdur 

## 2018-10-22 NOTE — Assessment & Plan Note (Signed)
Testosterone replacment q 2 weeks IM and using caverject  Potential benefits of a long term testosterone use as well as potential risks  and complications were explained to the patient and were aknowledged. 

## 2018-10-24 MED ORDER — BACLOFEN 10 MG PO TABS: 10.0000 mg | ORAL_TABLET | Freq: Three times a day (TID) | ORAL | 1 refills | Status: DC | PRN

## 2018-10-24 MED ORDER — HYDROCODONE-ACETAMINOPHEN 7.5-325 MG PO TABS: 1.0000 | ORAL_TABLET | Freq: Four times a day (QID) | ORAL | 0 refills | Status: DC | PRN

## 2018-10-24 MED ORDER — GABAPENTIN 100 MG PO CAPS: 100.0000 mg | ORAL_CAPSULE | Freq: Three times a day (TID) | ORAL | 1 refills | Status: DC

## 2018-10-28 ENCOUNTER — Ambulatory Visit: Payer: Medicare Other | Admitting: Internal Medicine

## 2018-11-26 DIAGNOSIS — Z79899 Other long term (current) drug therapy: Secondary | ICD-10-CM

## 2018-11-26 NOTE — Telephone Encounter (Signed)
Please advise about refill in Dr. Plotnikovs absence. 

## 2018-11-26 NOTE — Telephone Encounter (Signed)
Needs to come for UDS prior to fill as he has early fills in the last 3 months as well as no UDS in the last 1 year. Place order and route back once patient has come to lab

## 2018-11-27 ENCOUNTER — Other Ambulatory Visit: Payer: Medicare Other

## 2018-11-27 DIAGNOSIS — Z79899 Other long term (current) drug therapy: Secondary | ICD-10-CM

## 2018-11-27 MED ORDER — HYDROCODONE-ACETAMINOPHEN 7.5-325 MG PO TABS: 1.0000 | ORAL_TABLET | Freq: Four times a day (QID) | ORAL | 0 refills | Status: DC | PRN

## 2018-11-27 NOTE — Telephone Encounter (Signed)
Pt notified via mychart, lab ordered

## 2018-11-30 LAB — PAIN MGMT, PROFILE 8 W/CONF, U
6 Acetylmorphine: NEGATIVE ng/mL
Alcohol Metabolites: NEGATIVE ng/mL (ref <500)
Amphetamines: NEGATIVE ng/mL
Benzodiazepines: NEGATIVE ng/mL
Buprenorphine, Urine: NEGATIVE ng/mL
Cocaine Metabolite: NEGATIVE ng/mL
Codeine: NEGATIVE ng/mL
Creatinine: 27 mg/dL
Hydrocodone: 124 ng/mL
Hydromorphone: NEGATIVE ng/mL
MDMA: NEGATIVE ng/mL
Marijuana Metabolite: NEGATIVE ng/mL
Morphine: NEGATIVE ng/mL
Norhydrocodone: 281 ng/mL
Opiates: POSITIVE ng/mL
Oxidant: NEGATIVE ug/mL
Oxycodone: NEGATIVE ng/mL
pH: 7.2 (ref 4.5–9.0)

## 2018-12-04 ENCOUNTER — Ambulatory Visit
Admission: RE | Admit: 2018-12-04 | Discharge: 2018-12-04 | Disposition: A | Discharge status: Home / Self Care | Payer: Medicare Other | Source: Ambulatory Visit | Attending: Orthopedic Surgery | Admitting: Orthopedic Surgery

## 2018-12-04 ENCOUNTER — Other Ambulatory Visit: Payer: Self-pay

## 2018-12-04 DIAGNOSIS — G8929 Other chronic pain: Secondary | ICD-10-CM

## 2018-12-04 MED ORDER — IOPAMIDOL (ISOVUE-M 200) INJECTION 41%
1.0000 mL | Freq: Once | INTRAMUSCULAR | Status: AC
  Administered 2018-12-04: 1 mL via EPIDURAL

## 2018-12-04 MED ORDER — METHYLPREDNISOLONE ACETATE 40 MG/ML INJ SUSP (RADIOLOG
120.0000 mg | Freq: Once | INTRAMUSCULAR | Status: AC
  Administered 2018-12-04: 120 mg via EPIDURAL

## 2018-12-07 DIAGNOSIS — G5601 Carpal tunnel syndrome, right upper limb: Secondary | ICD-10-CM | POA: Insufficient documentation

## 2018-12-07 DIAGNOSIS — M19011 Primary osteoarthritis, right shoulder: Secondary | ICD-10-CM | POA: Insufficient documentation

## 2018-12-23 ENCOUNTER — Other Ambulatory Visit: Payer: Self-pay | Admitting: Internal Medicine

## 2019-01-21 ENCOUNTER — Ambulatory Visit: Payer: Medicare Other | Admitting: Internal Medicine

## 2019-01-22 ENCOUNTER — Other Ambulatory Visit: Payer: Self-pay | Admitting: Orthopedic Surgery

## 2019-01-22 DIAGNOSIS — M545 Low back pain, unspecified: Secondary | ICD-10-CM

## 2019-01-23 ENCOUNTER — Other Ambulatory Visit: Payer: Self-pay

## 2019-01-23 ENCOUNTER — Ambulatory Visit (INDEPENDENT_AMBULATORY_CARE_PROVIDER_SITE_OTHER): Payer: Medicare Other | Admitting: Internal Medicine

## 2019-01-23 ENCOUNTER — Encounter: Payer: Self-pay | Admitting: Internal Medicine

## 2019-01-23 DIAGNOSIS — E291 Testicular hypofunction: Secondary | ICD-10-CM | POA: Diagnosis not present

## 2019-01-23 DIAGNOSIS — G8929 Other chronic pain: Secondary | ICD-10-CM

## 2019-01-23 DIAGNOSIS — M5440 Lumbago with sciatica, unspecified side: Secondary | ICD-10-CM | POA: Diagnosis not present

## 2019-01-23 DIAGNOSIS — I10 Essential (primary) hypertension: Secondary | ICD-10-CM | POA: Diagnosis not present

## 2019-01-23 MED ORDER — POTASSIUM CHLORIDE CRYS ER 10 MEQ PO TBCR: 10.0000 meq | EXTENDED_RELEASE_TABLET | Freq: Every day | ORAL | 3 refills | Status: DC

## 2019-01-23 MED ORDER — HYDROCODONE-ACETAMINOPHEN 7.5-325 MG PO TABS: 1.0000 | ORAL_TABLET | Freq: Four times a day (QID) | ORAL | 0 refills | Status: DC | PRN

## 2019-01-23 NOTE — Assessment & Plan Note (Signed)
Testosterone replacment q 2 weeks IM and using caverject  Potential benefits of a long term testosterone use as well as potential risks  and complications were explained to the patient and were aknowledged. 

## 2019-01-23 NOTE — Assessment & Plan Note (Signed)
Norco  Potential benefits of a long term opioids use as well as potential risks (i.e. addiction risk, apnea etc) and complications (i.e. Somnolence, constipation and others) were explained to the patient and were aknowledged. 

## 2019-01-23 NOTE — Progress Notes (Signed)
Virtual Visit via Video Note  I connected with Troy Marsh on 01/23/19 at  3:20 PM EDT by a video enabled telemedicine application and verified that I am speaking with the correct person using two identifiers.   I discussed the limitations of evaluation and management by telemedicine and the availability of in person appointments. The patient expressed understanding and agreed to proceed.  History of Present Illness: We need to follow-up on LBP, HTN, hypogonadism  There has been no cough, chest pain, shortness of breath, abdominal pain, diarrhea, constipation, arthralgias, skin rashes.   Observations/Objective: The patient appears to be in no acute distress, looks well.  Assessment and Plan:  See my Assessment and Plan. Follow Up Instructions:    I discussed the assessment and treatment plan with the patient. The patient was provided an opportunity to ask questions and all were answered. The patient agreed with the plan and demonstrated an understanding of the instructions.   The patient was advised to call back or seek an in-person evaluation if the symptoms worsen or if the condition fails to improve as anticipated.  I provided face-to-face time during this encounter. We were at different locations.   [name], MD

## 2019-01-23 NOTE — Assessment & Plan Note (Signed)
HCTZ K dur renewed

## 2019-02-05 ENCOUNTER — Other Ambulatory Visit: Payer: Medicare Other

## 2019-02-14 ENCOUNTER — Ambulatory Visit
Admission: RE | Admit: 2019-02-14 | Discharge: 2019-02-14 | Disposition: A | Discharge status: Home / Self Care | Payer: Medicare Other | Source: Ambulatory Visit | Attending: Orthopedic Surgery | Admitting: Orthopedic Surgery

## 2019-02-14 ENCOUNTER — Other Ambulatory Visit: Payer: Self-pay

## 2019-02-14 DIAGNOSIS — M545 Low back pain, unspecified: Secondary | ICD-10-CM

## 2019-02-14 MED ORDER — METHYLPREDNISOLONE ACETATE 40 MG/ML INJ SUSP (RADIOLOG
120.0000 mg | Freq: Once | INTRAMUSCULAR | Status: AC
  Administered 2019-02-14: 120 mg via EPIDURAL

## 2019-02-14 MED ORDER — IOPAMIDOL (ISOVUE-M 200) INJECTION 41%
1.0000 mL | Freq: Once | INTRAMUSCULAR | Status: AC
  Administered 2019-02-14: 09:00:00 1 mL via EPIDURAL

## 2019-03-10 ENCOUNTER — Other Ambulatory Visit: Payer: Self-pay | Admitting: Gastroenterology

## 2019-03-10 ENCOUNTER — Other Ambulatory Visit: Payer: Self-pay | Admitting: Internal Medicine

## 2019-03-11 ENCOUNTER — Other Ambulatory Visit: Payer: Self-pay

## 2019-03-11 NOTE — Telephone Encounter (Signed)
Check  registry last filled 03/06/2019.Marland KitchenJohny Marsh

## 2019-03-13 MED ORDER — HYDROCODONE-ACETAMINOPHEN 7.5-325 MG PO TABS: 1.0000 | ORAL_TABLET | Freq: Four times a day (QID) | ORAL | 0 refills | Status: DC | PRN

## 2019-03-18 ENCOUNTER — Other Ambulatory Visit: Payer: Self-pay | Admitting: *Deleted

## 2019-03-18 NOTE — Telephone Encounter (Signed)
Received Rf request for Tadalafil 5 mg. This medication was no longer active on current med list. Ok to Rf?

## 2019-03-19 ENCOUNTER — Other Ambulatory Visit: Payer: Self-pay | Admitting: Orthopedic Surgery

## 2019-03-19 DIAGNOSIS — M545 Low back pain, unspecified: Secondary | ICD-10-CM

## 2019-03-19 DIAGNOSIS — G8929 Other chronic pain: Secondary | ICD-10-CM

## 2019-03-21 MED ORDER — TADALAFIL 5 MG PO TABS: ORAL_TABLET | ORAL | 1 refills | Status: DC

## 2019-03-28 ENCOUNTER — Other Ambulatory Visit: Payer: Self-pay

## 2019-03-28 ENCOUNTER — Ambulatory Visit
Admission: RE | Admit: 2019-03-28 | Discharge: 2019-03-28 | Disposition: A | Discharge status: Home / Self Care | Payer: Medicare Other | Source: Ambulatory Visit | Attending: Orthopedic Surgery | Admitting: Orthopedic Surgery

## 2019-03-28 DIAGNOSIS — M545 Low back pain, unspecified: Secondary | ICD-10-CM

## 2019-03-28 DIAGNOSIS — G8929 Other chronic pain: Secondary | ICD-10-CM

## 2019-03-28 MED ORDER — IOPAMIDOL (ISOVUE-M 200) INJECTION 41%
1.0000 mL | Freq: Once | INTRAMUSCULAR | Status: AC
  Administered 2019-03-28: 08:00:00 1 mL via EPIDURAL

## 2019-03-28 MED ORDER — METHYLPREDNISOLONE ACETATE 40 MG/ML INJ SUSP (RADIOLOG
120.0000 mg | Freq: Once | INTRAMUSCULAR | Status: AC
  Administered 2019-03-28: 120 mg via EPIDURAL

## 2019-03-28 NOTE — Discharge Instructions (Signed)

## 2019-04-08 ENCOUNTER — Other Ambulatory Visit: Payer: Self-pay | Admitting: Gastroenterology

## 2019-04-08 ENCOUNTER — Other Ambulatory Visit: Payer: Self-pay | Admitting: Internal Medicine

## 2019-04-09 ENCOUNTER — Telehealth: Payer: Self-pay | Admitting: Gastroenterology

## 2019-04-09 NOTE — Telephone Encounter (Signed)
Would you like to refill this as a 90 days supply?

## 2019-04-11 MED ORDER — HYDROCORTISONE ACETATE 25 MG RE SUPP: RECTAL | 4 refills | Status: DC

## 2019-04-11 NOTE — Telephone Encounter (Signed)
I have sent prescription to patient's pharmacy.  

## 2019-04-11 NOTE — Telephone Encounter (Signed)
Please do For 90 days, 4 refills  Thx  RG

## 2019-04-29 IMAGING — MR MR CERVICAL SPINE W/O CM
4 of 5 series · 26 of 48 positions shown · non-contrast
Comparison: Intraoperative image 11/22/17. Preoperative [REDACTED] cervical spine MRI 10/13/2017.

CLINICAL DATA: 67-year-old male with numbness and tingling in the
right shoulder arm and wrist for 6 months with no known injury.
Cervical spine surgery on 11/22/2017.

EXAM:
MRI CERVICAL SPINE WITHOUT CONTRAST
TECHNIQUE: Multiplanar, multisequence MR imaging of the cervical spine was
performed. No intravenous contrast was administered.

[Series 4: T2 · sagittal · 3.0mm · 0.41mm/px · 8 of 17 slices shown (1 of 2)]
[im 1/17]
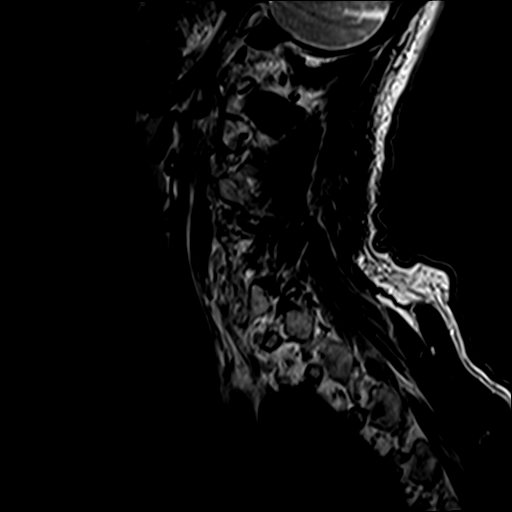
[im 3/17]
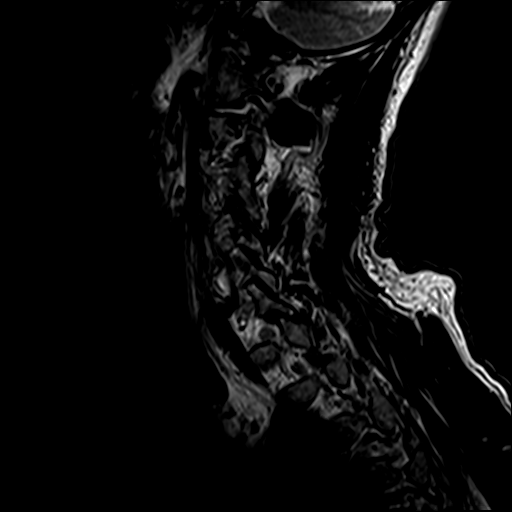
[im 5/17]
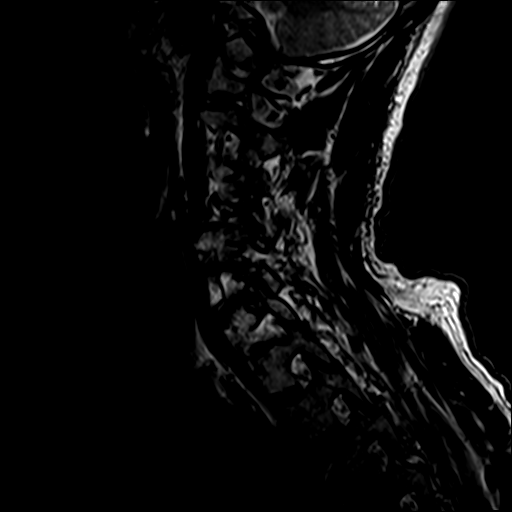
[im 7/17]
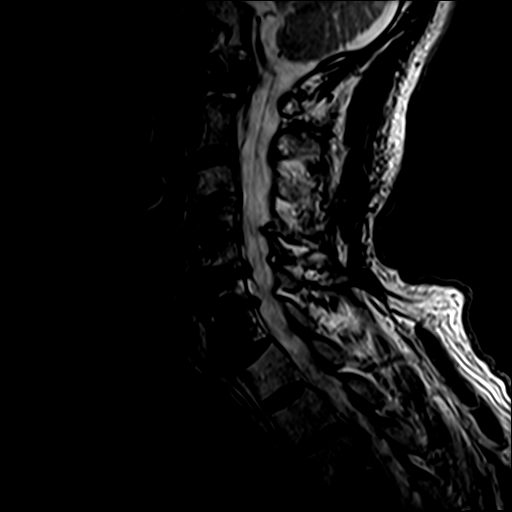
[im 10/17]
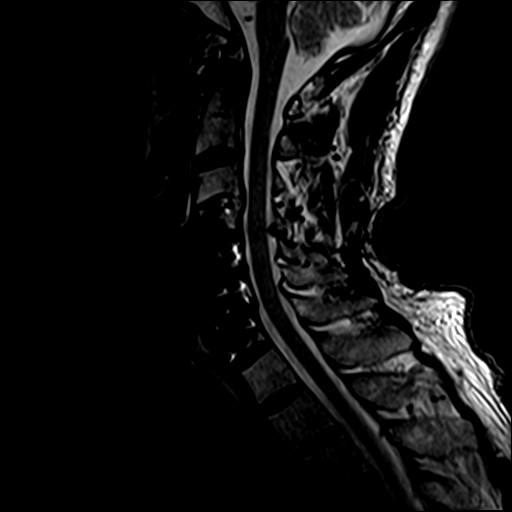
[im 12/17]
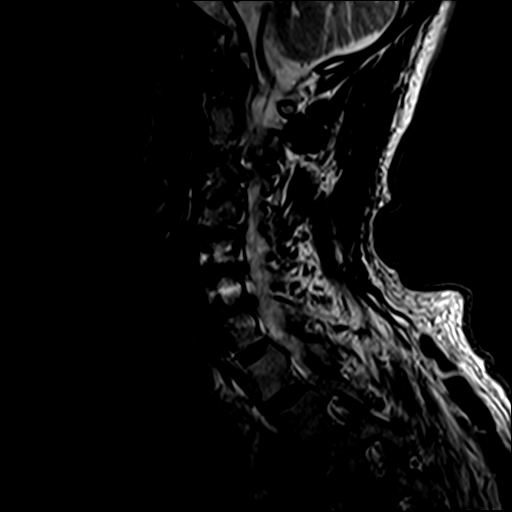
[im 14/17]
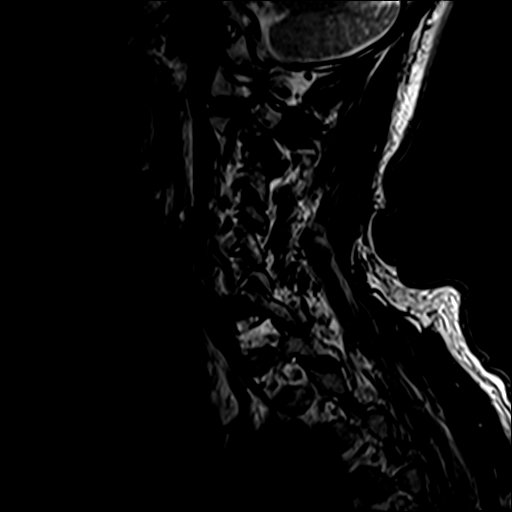
[im 17/17]
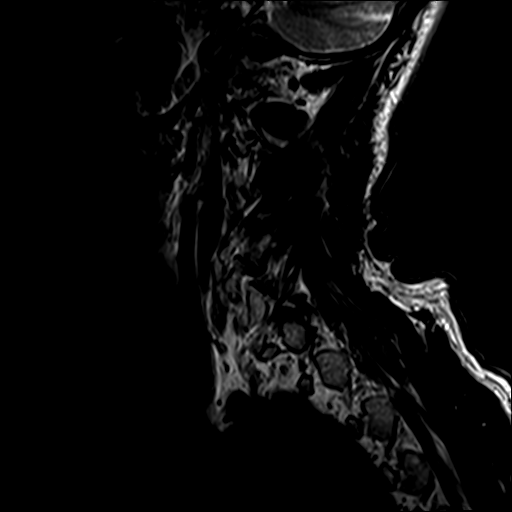

[Series 5: T1 · sagittal · 3.0mm · 0.41mm/px · 6 of 17 slices shown]
[im 1/17]
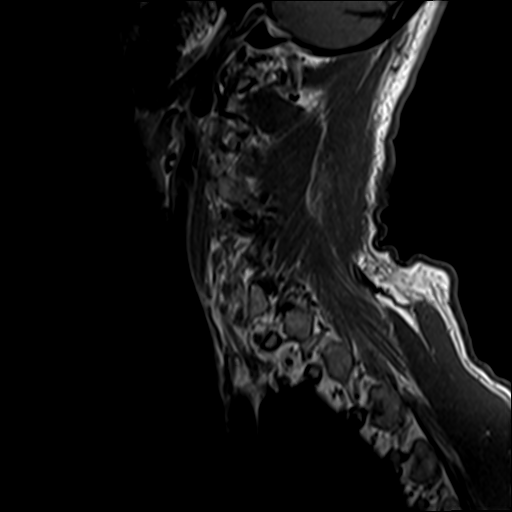
[im 3/17]
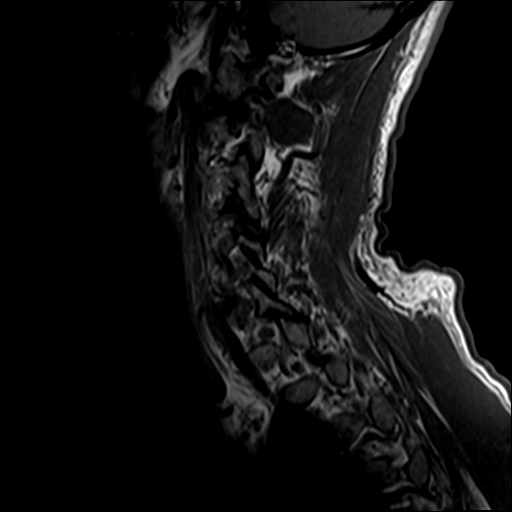
[im 6/17]
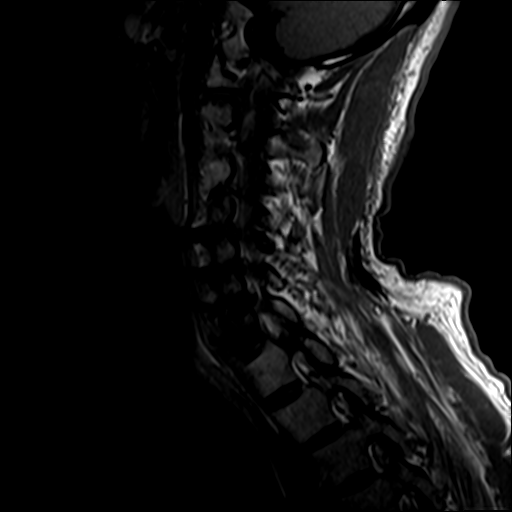
[im 9/17]
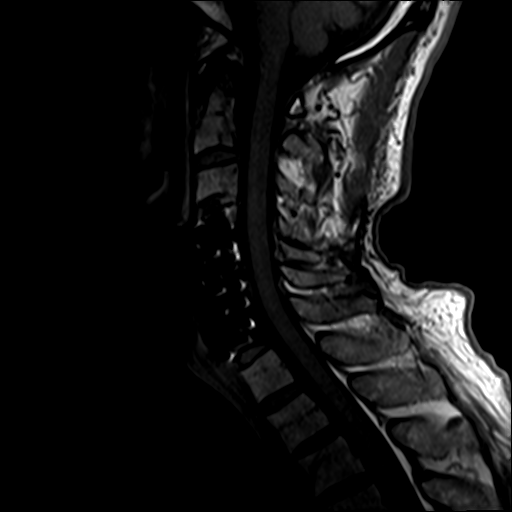
[im 11/17]
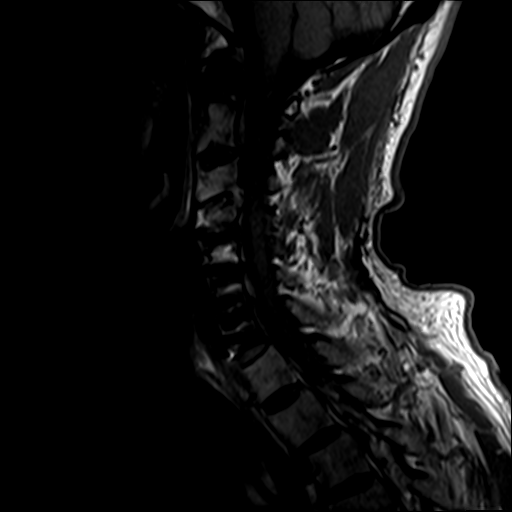
[im 14/17]
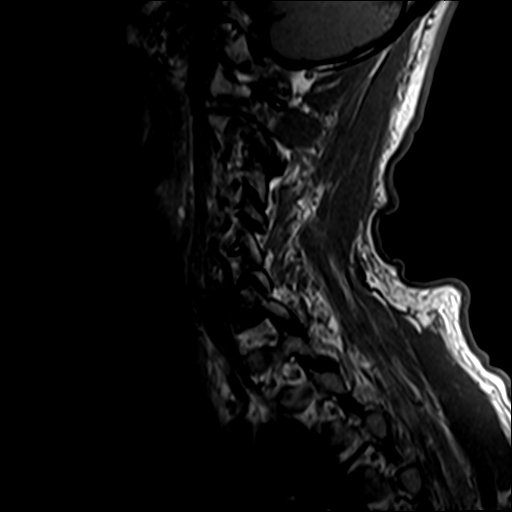

[Series 6: STIR · sagittal · 3.0mm · 0.82mm/px · 3 of 17 slices shown]
[im 3/17]
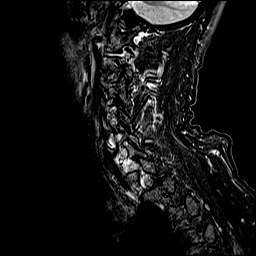
[im 9/17]
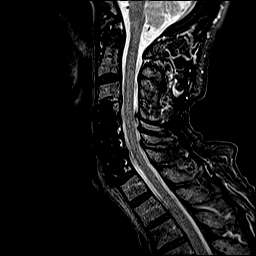
[im 14/17]
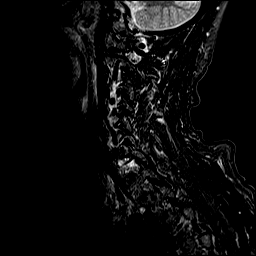

[Series 8: T2 · axial · 3.0mm · 0.70mm/px · z∈[-9,+100]mm · 9 of 30 slices shown (2 of 2)]
[im 1/30]
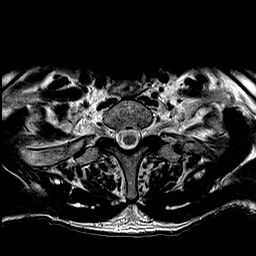
[im 5/30]
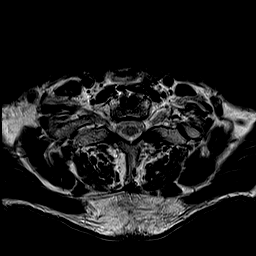
[im 10/30]
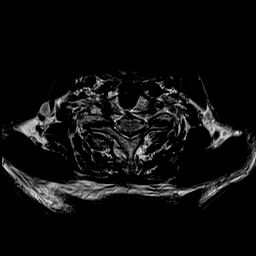
[im 13/30]
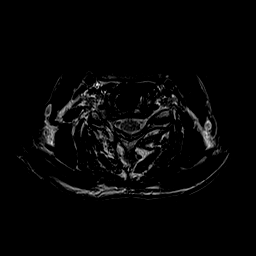
[im 15/30]
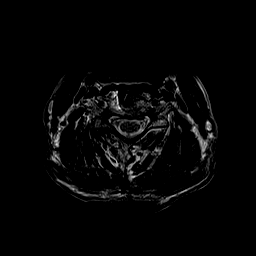
[im 17/30]
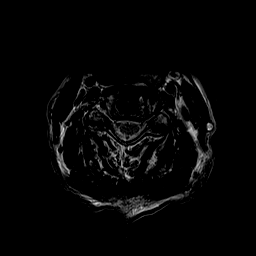
[im 20/30]
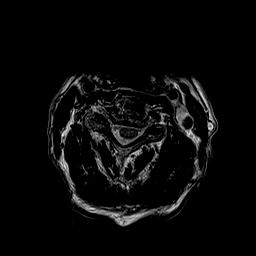
[im 25/30]
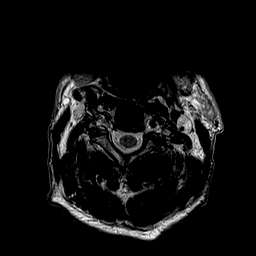
[im 30/30]
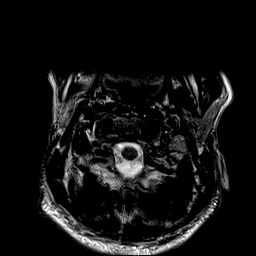

[26 of 48 positions shown; findings below may reference images not displayed]

FINDINGS: Alignment: Stable since the preoperative MRI. Mild straightening of
cervical lordosis.

Vertebrae: Mild hardware susceptibility artifact from interval C4-C5
through C6-C7 ACDF. No convincing marrow edema or acute osseous
abnormality. Suspicion of chronic interbody ankylosis at C3-C4 via
left posterolateral endplate osteophyte, and furthermore there is
evidence of right side C3-C4 facet ankylosis.

Cord: Spinal cord signal is within normal limits at all visualized
levels.

Posterior Fossa, vertebral arteries, paraspinal tissues:
Cervicomedullary junction is within normal limits. Preserved major
vascular flow voids in the neck. The left vertebral artery may be
dominant. Negative neck soft tissues and visible lung apices.

Disc levels:

C2-C3: Moderate left facet hypertrophy with moderate left C3
foraminal stenosis, stable.

C3-C4: Chronic ankylosis at this level with no spinal stenosis, but
multifactorial moderate to severe bilateral C4 foraminal stenosis.

C4-C5: Interval ACDF. Chronic mild to moderate ligament flavum
hypertrophy. Improved spinal canal patency with no significant
spinal stenosis. Bilateral C5 foraminal patency also appears
improved with mild residual stenosis suspected on the left.

C5-C6: Interval ACDF. Mild residual ligament flavum hypertrophy.
Improved thecal sac patency and no spinal stenosis. Bilateral
foraminal patency appears stable with up to moderate C6 foraminal
stenosis.

C6-C7: Interval ACDF. Mild residual ligament flavum hypertrophy. No
spinal stenosis. Far lateral disc osteophyte complex redemonstrated
with stable appearing moderate to severe C7 foraminal stenosis
greater on the right.

C7-T1:  Mild facet hypertrophy, otherwise negative.

Stable and negative visible upper thoracic levels.
IMPRESSION: 1. Chronic C3-C4 ankylosis and interval C4 through C7 ACDF.
2. No spinal stenosis or spinal cord abnormality.. Bilateral C5
foraminal patency appears improved where as C4, C6 and C7 foraminal
stenosis appears stable.
3. No significant adjacent segment disease at C7-T1. Stable moderate
left facet hypertrophy at C2-C3 with left C3 foraminal stenosis.

## 2019-04-30 ENCOUNTER — Other Ambulatory Visit: Payer: Self-pay

## 2019-05-02 MED ORDER — HYDROCODONE-ACETAMINOPHEN 7.5-325 MG PO TABS: 1.0000 | ORAL_TABLET | Freq: Four times a day (QID) | ORAL | 0 refills | Status: DC | PRN

## 2019-05-03 ENCOUNTER — Other Ambulatory Visit: Payer: Self-pay | Admitting: Internal Medicine

## 2019-05-03 MED ORDER — ALPROSTADIL (VASODILATOR) 20 MCG IC KIT: 20.0000 ug | PACK | INTRACAVERNOUS | 3 refills | Status: DC | PRN

## 2019-05-03 NOTE — Progress Notes (Signed)
rx

## 2019-05-09 ENCOUNTER — Telehealth: Payer: Self-pay | Admitting: Internal Medicine

## 2019-05-09 NOTE — Telephone Encounter (Signed)
Rx for alprostadil (EDEX) 20 MCG injection  Came over as 6 and they wanted to confirm if this was to be 6 does or 6 cartridges / it comes in a 2 pack kit or a 6 pack kit. / please call to confirm

## 2019-05-12 NOTE — Telephone Encounter (Signed)
See my chart message

## 2019-05-13 MED ORDER — ALPROSTADIL (VASODILATOR) 20 MCG IC KIT: 20.0000 ug | PACK | INTRACAVERNOUS | 3 refills | Status: DC | PRN

## 2019-05-19 ENCOUNTER — Telehealth: Payer: Self-pay | Admitting: Internal Medicine

## 2019-05-19 MED ORDER — ALPROSTADIL (VASODILATOR) 20 MCG IC KIT: 20.0000 ug | PACK | INTRACAVERNOUS | 3 refills | Status: DC | PRN

## 2019-05-19 NOTE — Telephone Encounter (Signed)
RX resent

## 2019-05-19 NOTE — Telephone Encounter (Signed)
Pt states rx ( alprostadil (EDEX) 20 MCG injection) must be written specifically as follows and resubmitted to [facility]:    3 boxes of 6, 18 total for 90 day supply

## 2019-05-26 ENCOUNTER — Other Ambulatory Visit: Payer: Self-pay | Admitting: Internal Medicine

## 2019-05-29 ENCOUNTER — Other Ambulatory Visit: Payer: Self-pay | Admitting: Orthopedic Surgery

## 2019-05-29 DIAGNOSIS — M545 Low back pain, unspecified: Secondary | ICD-10-CM

## 2019-05-29 DIAGNOSIS — G8929 Other chronic pain: Secondary | ICD-10-CM

## 2019-06-11 ENCOUNTER — Other Ambulatory Visit: Payer: Medicare Other

## 2019-06-26 ENCOUNTER — Other Ambulatory Visit: Payer: Self-pay

## 2019-06-30 NOTE — Telephone Encounter (Signed)
[place] Controlled Database Checked Last filled: 05/03/19 # 120 LOV w/you: 01/23/19 Next appt w/you: None

## 2019-07-01 ENCOUNTER — Other Ambulatory Visit: Payer: Self-pay | Admitting: Internal Medicine

## 2019-07-01 MED ORDER — HYDROCODONE-ACETAMINOPHEN 7.5-325 MG PO TABS: 1.0000 | ORAL_TABLET | Freq: Four times a day (QID) | ORAL | 0 refills | Status: DC | PRN

## 2019-07-07 ENCOUNTER — Ambulatory Visit
Admission: RE | Admit: 2019-07-07 | Discharge: 2019-07-07 | Disposition: A | Discharge status: Home / Self Care | Payer: Medicare Other | Source: Ambulatory Visit | Attending: Orthopedic Surgery | Admitting: Orthopedic Surgery

## 2019-07-07 ENCOUNTER — Other Ambulatory Visit: Payer: Self-pay

## 2019-07-07 DIAGNOSIS — M545 Low back pain, unspecified: Secondary | ICD-10-CM

## 2019-07-07 DIAGNOSIS — G8929 Other chronic pain: Secondary | ICD-10-CM

## 2019-07-07 MED ORDER — IOPAMIDOL (ISOVUE-M 200) INJECTION 41%
1.0000 mL | Freq: Once | INTRAMUSCULAR | Status: AC
  Administered 2019-07-07: 1 mL via EPIDURAL

## 2019-07-07 MED ORDER — METHYLPREDNISOLONE ACETATE 40 MG/ML INJ SUSP (RADIOLOG
120.0000 mg | Freq: Once | INTRAMUSCULAR | Status: AC
  Administered 2019-07-07: 120 mg via EPIDURAL

## 2019-11-11 ENCOUNTER — Other Ambulatory Visit: Payer: Self-pay

## 2019-11-12 MED ORDER — HYDROCODONE-ACETAMINOPHEN 7.5-325 MG PO TABS: 1.0000 | ORAL_TABLET | Freq: Four times a day (QID) | ORAL | 0 refills | Status: DC | PRN

## 2020-02-10 ENCOUNTER — Ambulatory Visit (INDEPENDENT_AMBULATORY_CARE_PROVIDER_SITE_OTHER): Payer: Medicare Other | Admitting: Internal Medicine

## 2020-02-10 ENCOUNTER — Encounter: Payer: Self-pay | Admitting: Internal Medicine

## 2020-02-10 ENCOUNTER — Other Ambulatory Visit: Payer: Self-pay

## 2020-02-10 DIAGNOSIS — E291 Testicular hypofunction: Secondary | ICD-10-CM | POA: Diagnosis not present

## 2020-02-10 DIAGNOSIS — I10 Essential (primary) hypertension: Secondary | ICD-10-CM | POA: Diagnosis not present

## 2020-02-10 DIAGNOSIS — Z21 Asymptomatic human immunodeficiency virus [HIV] infection status: Secondary | ICD-10-CM | POA: Diagnosis not present

## 2020-02-10 DIAGNOSIS — G8929 Other chronic pain: Secondary | ICD-10-CM

## 2020-02-10 DIAGNOSIS — M545 Low back pain: Secondary | ICD-10-CM | POA: Diagnosis not present

## 2020-02-10 LAB — TESTOSTERONE: Testosterone: 231 ng/dL — ABNORMAL LOW (ref 250–827)

## 2020-02-10 NOTE — Assessment & Plan Note (Signed)
Norco prn  Potential benefits of a long term opioids use as well as potential risks (i.e. addiction risk, apnea etc) and complications (i.e. Somnolence, constipation and others) were explained to the patient and were aknowledged. 

## 2020-02-10 NOTE — Addendum Note (Signed)
Addended by: Cresenciano Lick on: 02/10/2020 11:41 AM   Modules accepted: Orders

## 2020-02-10 NOTE — Progress Notes (Signed)
Subjective:  Patient ID: Troy Marsh, male    DOB: 20-Mar-1951  Age: 69 y.o. MRN: [id number]  CC: No chief complaint on file.   HPI Troy Marsh presents for hypogonadism, LBP, HIV f/u  Outpatient Medications Prior to Visit  Medication Sig Dispense Refill  . alprostadil (EDEX) 20 MCG injection 20 mcg by Intracavitary route as needed for erectile dysfunction. use no more than 3 times per week 18 each 3  . aspirin 81 MG tablet Take 81 mg by mouth daily.      . baclofen (LIORESAL) 10 MG tablet TAKE 1 TABLET BY MOUTH 3  TIMES DAILY AS NEEDED FOR  MUSCLE SPASM(S) 270 tablet 0  . BD DISP NEEDLES 21G X 1-1/2" MISC USE AS DIRECTED 100 each 0  . celecoxib (CELEBREX) 200 MG capsule TAKE 1 CAPSULE BY MOUTH TWO TIMES DAILY 180 capsule 3  . cetirizine-pseudoephedrine (ZYRTEC-D ALLERGY & CONGESTION) 5-120 MG tablet Take 1 tablet by mouth daily as needed for allergies.     [name] emtricitabine-rilpivir-tenofovir AF (ODEFSEY) 200-25-25 MG TABS tablet Take 1 tablet by mouth daily.    [name] gabapentin (NEURONTIN) 100 MG capsule TAKE 1 CAPSULE BY MOUTH 3  TIMES DAILY 270 capsule 3  . hydrochlorothiazide (HYDRODIURIL) 50 MG tablet TAKE 1 TABLET BY MOUTH  DAILY 90 tablet 3  . HYDROcodone-acetaminophen (NORCO) 7.5-325 MG tablet Take 1 tablet by mouth every 6 (six) hours as needed for severe pain. 120 tablet 0  . HYDROcodone-acetaminophen (NORCO) 7.5-325 MG tablet Take 1 tablet by mouth every 6 (six) hours as needed for severe pain. 120 tablet 0  . HYDROcodone-acetaminophen (NORCO) 7.5-325 MG tablet Take 1 tablet by mouth every 6 (six) hours as needed for severe pain. 120 tablet 0  . hydrocortisone (ANUCORT-HC) 25 MG suppository UNWRAP AND INSERT 1  SUPPOSITORY RECTALLY TWICE  DAILY 90 suppository 4  . LINZESS 72 MCG capsule TAKE 1 CAPSULE BY MOUTH  DAILY BEFORE BREAKFAST 90 capsule 3  . Melatonin 10 MG TABS Take 10 mg by mouth at bedtime as needed (for sleep).     . montelukast (SINGULAIR) 10 MG tablet TAKE 1  TABLET BY MOUTH  DAILY 90 tablet 3  . Multiple Vitamin (MULTIVITAMIN WITH MINERALS) TABS tablet Take 1 tablet by mouth daily. COMPLETE MULTIVITAMIN MEN'S 50+    . polyethylene glycol powder (GLYCOLAX/MIRALAX) powder Take 17 g by mouth daily as needed for moderate constipation.     . potassium chloride (K-DUR) 10 MEQ tablet Take 1 tablet (10 mEq total) by mouth daily. 90 tablet 3  . potassium chloride (K-DUR) 10 MEQ tablet TAKE 1 TABLET BY MOUTH  DAILY 90 tablet 3  . RESTASIS 0.05 % ophthalmic emulsion Place 1 drop into both eyes 2 (two) times daily.    . tadalafil (CIALIS) 5 MG tablet TAKE 1 TABLET EVERY DAY 90 tablet 1  . testosterone enanthate (DELATESTRYL) 200 MG/ML injection INJECT 1 ML INTRAMUSCULARLY EVERY 14 DAYS . DISCARD 28 DAYS AFTER INITIAL USE. 5 mL 3  . alfuzosin (UROXATRAL) 10 MG 24 hr tablet Take 10 mg by mouth at bedtime.     [name] atorvastatin (LIPITOR) 40 MG tablet Take 40 mg by mouth at bedtime.     . mirabegron ER (MYRBETRIQ) 25 MG TB24 tablet Take 25 mg by mouth at bedtime.      No facility-administered medications prior to visit.    ROS: Review of Systems  Constitutional: Negative for appetite change, fatigue and unexpected weight change.  HENT: Negative for  congestion, nosebleeds, sneezing, sore throat and trouble swallowing.   Eyes: Negative for itching and visual disturbance.  Respiratory: Negative for cough.   Cardiovascular: Negative for chest pain, palpitations and leg swelling.  Gastrointestinal: Negative for abdominal distention, blood in stool, diarrhea and nausea.  Genitourinary: Negative for frequency and hematuria.  Musculoskeletal: Positive for back pain. Negative for gait problem, joint swelling and neck pain.  Skin: Negative for rash.  Neurological: Negative for dizziness, tremors, speech difficulty and weakness.  Psychiatric/Behavioral: Negative for agitation, dysphoric mood, sleep disturbance and suicidal ideas. The patient is not nervous/anxious.      Objective:  BP (!) 148/90 (BP Location: Left Arm, Patient Position: Sitting, Cuff Size: Normal)   Pulse 52   Temp 98.2 F (36.8 C) (Oral)   Ht 5\' 10"  (1.778 m)   Wt 140 lb (63.5 kg)   SpO2 96%   BMI 20.09 kg/m   BP Readings from Last 3 Encounters:  02/10/20 (!) 148/90  07/07/19 136/81  03/28/19 121/80    Wt Readings from Last 3 Encounters:  02/10/20 140 lb (63.5 kg)  09/20/18 142 lb (64.4 kg)  07/24/18 142 lb 8 oz (64.6 kg)    Physical Exam Constitutional:      General: He is not in acute distress.    Appearance: He is well-developed.     Comments: NAD  Eyes:     Conjunctiva/sclera: Conjunctivae normal.     Pupils: Pupils are equal, round, and reactive to light.  Neck:     Thyroid: No thyromegaly.     Vascular: No JVD.  Cardiovascular:     Rate and Rhythm: Normal rate and regular rhythm.     Heart sounds: Normal heart sounds. No murmur heard.  No friction rub. No gallop.   Pulmonary:     Effort: Pulmonary effort is normal. No respiratory distress.     Breath sounds: Normal breath sounds. No wheezing or rales.  Chest:     Chest wall: No tenderness.  Abdominal:     General: Bowel sounds are normal. There is no distension.     Palpations: Abdomen is soft. There is no mass.     Tenderness: There is no abdominal tenderness. There is no guarding or rebound.  Musculoskeletal:        General: Tenderness present. Normal range of motion.     Cervical back: Normal range of motion.  Lymphadenopathy:     Cervical: No cervical adenopathy.  Skin:    General: Skin is warm and dry.     Findings: No rash.  Neurological:     Mental Status: He is alert and oriented to person, place, and time.     Cranial Nerves: No cranial nerve deficit.     Motor: No abnormal muscle tone.     Coordination: Coordination normal.     Gait: Gait normal.     Deep Tendon Reflexes: Reflexes are normal and symmetric.  Psychiatric:        Behavior: Behavior normal.        Thought Content:  Thought content normal.        Judgment: Judgment normal.     Lab Results  Component Value Date   WBC 5.4 06/03/2018   HGB 14.5 06/03/2018   HCT 42.6 06/03/2018   PLT 141 (L) 06/03/2018   GLUCOSE 96 06/03/2018   CHOL 160 04/24/2017   TRIG 61.0 04/24/2017   HDL 80.70 04/24/2017   LDLDIRECT 113.3 05/21/2006   LDLCALC 67 04/24/2017   ALT 19  05/29/2018   AST 20 05/29/2018   NA 134 (L) 06/03/2018   K 4.4 06/03/2018   CL 98 06/03/2018   CREATININE 1.05 06/03/2018   BUN 14 06/03/2018   CO2 31 06/03/2018   TSH 3.21 10/23/2017   PSA 3.47 04/24/2018   INR 1.00 05/29/2018    DG INJECT DIAG/THERA/INC NEEDLE/CATH/PLC EPI/LUMB/SAC W/IMG  Result Date: 07/07/2019 CLINICAL DATA:  Chronic low back pain. Displacement L4-5 lumbar disc. Left lower extremity radiculopathy. Patient has had excellent relief from prior injections. His pain has recurred. FLUOROSCOPY TIME:  Radiation Exposure Index (as provided by the fluoroscopic device): 16.95 uGy*m2 PROCEDURE: The procedure, risks, benefits, and alternatives were explained to the patient. Questions regarding the procedure were encouraged and answered. The patient understands and consents to the procedure. LUMBAR EPIDURAL INJECTION: An interlaminar approach was performed on left at L4-5. The overlying skin was cleansed and anesthetized. A 20 gauge epidural needle was advanced using loss-of-resistance technique. DIAGNOSTIC EPIDURAL INJECTION: Injection of Isovue-M 200 shows a good epidural pattern with spread above and below the level of needle placement, primarily on the left no vascular opacification is seen. THERAPEUTIC EPIDURAL INJECTION: 120 mg of Depo-Medrol mixed with 3 mL 1% lidocaine were instilled. The procedure was well-tolerated, and the patient was discharged thirty minutes following the injection in good condition. COMPLICATIONS: None IMPRESSION: Technically successful epidural injection on the left L4-5 # 3 Electronically Signed   By:  San Morelle M.D.   On: 07/07/2019 09:07    Assessment & Plan:    [name], MD

## 2020-02-10 NOTE — Assessment & Plan Note (Signed)
[facility] recent labs reviewed

## 2020-02-10 NOTE — Assessment & Plan Note (Signed)
Labs today

## 2020-02-10 NOTE — Assessment & Plan Note (Signed)
Labs

## 2020-02-27 ENCOUNTER — Ambulatory Visit (INDEPENDENT_AMBULATORY_CARE_PROVIDER_SITE_OTHER): Payer: Medicare Other | Admitting: Otolaryngology

## 2020-02-27 ENCOUNTER — Other Ambulatory Visit: Payer: Self-pay

## 2020-02-27 VITALS — Temp 97.2°F

## 2020-02-27 DIAGNOSIS — H6121 Impacted cerumen, right ear: Secondary | ICD-10-CM

## 2020-02-27 NOTE — Progress Notes (Signed)
HPI: Troy Marsh is a 69 y.o. male who returns today for evaluation of bleeding from his right ear.  This occurred about 10 days ago.  He does use ear buds at night to help him sleep.  He noticed some blood in the morning from the right ear canal.  He used hydrogen peroxide to help clean the ear.  It is feeling better today but he wanted the ear checked..  Past Medical History:  Diagnosis Date  . Arthritis   . BPH (benign prostatic hyperplasia)    [facility]  DR. Elie Goody  . Cancer ([place])    Skin cancer- basal and squamous  . Constipation due to pain medication   . HIV disease ([place]) 1985   last CD4 10/02/12 was 277  . Hypertension   . Hypogonadism male    Past Surgical History:  Procedure Laterality Date  . ANTERIOR CERVICAL DECOMP/DISCECTOMY FUSION Bilateral 11/22/2017   Procedure: ANTERIOR CERVICAL DECOMPRESSION FUSION CERVICAL 4-5, CERVICAL 5-6, CERVICAL 6-7 WITH INSTRUMENTATION AND ALLOGRAFT TIME      /   REQUESTED 4 HOURS;  Surgeon: Phylliss Bob, MD;  Location: [place];  Service: Orthopedics;  Laterality: Bilateral;  . BACK SURGERY    . COLONOSCOPY W/ POLYPECTOMY    . EYE SURGERY Bilateral    Lasik, Cataract  . [place]  . INGUINAL HERNIA REPAIR Right 06/11/2018   Procedure: LAPAROSCOPIC RIGHT INGUINAL HERNIA WITH MESH;  Surgeon: Ralene Ok, MD;  Location: [place];  Service: General;  Laterality: Right;  . INSERTION OF MESH Right 06/11/2018   Procedure: INSERTION OF MESH;  Surgeon: Ralene Ok, MD;  Location: [place];  Service: General;  Laterality: Right;  . KNEE ARTHROSCOPY Right 2009  . KNEE ARTHROSCOPY Left 2011  . neck surgery  1984  . POSTERIOR CERVICAL FUSION/FORAMINOTOMY N/A 05/29/2018   Procedure: POSTERIOR CERVICAL DECOMPRESSION FUSION CERVICAL 4 - CERVICAL 7 WITH INSTRUMENTATION AND ALLOGRAFT;  Surgeon: Phylliss Bob, MD;  Location: [place];  Service: Orthopedics;  Laterality: N/A;   Social History   Socioeconomic History  . Marital  status: Married    Spouse name: [name] (partner)  . Number of children: Not on file  . Years of education: 77  . Highest education level: Not on file  Occupational History  . Occupation: retired    Fish farm manager: [employer]    Comment: government work  Tobacco Use  . Smoking status: Former Smoker    Packs/day: 2.00    Years: 35.00    Pack years: 70.00    Types: Cigarettes    Quit date: 02/10/2004    Years since quitting: 16.0  . Smokeless tobacco: Never Used  Vaping Use  . Vaping Use: Never used  Substance and Sexual Activity  . Alcohol use: No    Comment: quit 2004  . Drug use: No  . Sexual activity: Yes    Partners: Male    Birth control/protection: Condom    Comment: monogamous w/ single male partner 61  Other Topics Concern  . Not on file  Social History Narrative   [employer]. Work - [place] [employer] - retired 2009. Long term monogamous relationship. Lives in his own home.             Social Determinants of Health   Financial Resource Strain:   . Difficulty of Paying Living Expenses:   Food Insecurity:   . Worried About Charity fundraiser in the Last Year:   .  Ran Out of Food in the Last Year:   Transportation Needs:   . Film/video editor (Medical):   [name] Lack of Transportation (Non-Medical):   Physical Activity:   . Days of Exercise per Week:   . Minutes of Exercise per Session:   Stress:   . Feeling of Stress :   Social Connections:   . Frequency of Communication with Friends and Family:   . Frequency of Social Gatherings with Friends and Family:   . Attends Religious Services:   . Active Member of Clubs or Organizations:   . Attends Archivist Meetings:   [name] Marital Status:    Family History  Problem Relation Age of Onset  . Coronary artery disease Father   . Heart attack Father   . Hypertension Father   . Hyperlipidemia Father   . Cancer Father        prostate  . Colitis Maternal Grandmother    . Diabetes Neg Hx   . Colon cancer Neg Hx   . Rectal cancer Neg Hx   . Stomach cancer Neg Hx    No Known Allergies Prior to Admission medications   Medication Sig Start Date End Date Taking? Authorizing Provider  alprostadil (EDEX) 20 MCG injection 20 mcg by Intracavitary route as needed for erectile dysfunction. use no more than 3 times per week 05/19/19  Yes Plotnikov, Evie Lacks, MD  aspirin 81 MG tablet Take 81 mg by mouth daily.     Yes [provider]  baclofen (LIORESAL) 10 MG tablet TAKE 1 TABLET BY MOUTH 3  TIMES DAILY AS NEEDED FOR  MUSCLE SPASM(S) 04/08/19  Yes Plotnikov, Evie Lacks, MD  BD DISP NEEDLES 21G X 1-1/2" MISC USE AS DIRECTED 12/23/18  Yes Plotnikov, Evie Lacks, MD  celecoxib (CELEBREX) 200 MG capsule TAKE 1 CAPSULE BY MOUTH TWO TIMES DAILY 04/08/19  Yes Plotnikov, Evie Lacks, MD  cetirizine-pseudoephedrine (ZYRTEC-D ALLERGY & CONGESTION) 5-120 MG tablet Take 1 tablet by mouth daily as needed for allergies.    Yes [provider]  emtricitabine-rilpivir-tenofovir AF (ODEFSEY) 200-25-25 MG TABS tablet Take 1 tablet by mouth daily. 04/10/16  Yes [provider]  gabapentin (NEURONTIN) 100 MG capsule TAKE 1 CAPSULE BY MOUTH 3  TIMES DAILY 03/10/19  Yes Plotnikov, Evie Lacks, MD  hydrochlorothiazide (HYDRODIURIL) 50 MG tablet TAKE 1 TABLET BY MOUTH  DAILY 04/08/19  Yes Plotnikov, Evie Lacks, MD  HYDROcodone-acetaminophen (NORCO) 7.5-325 MG tablet Take 1 tablet by mouth every 6 (six) hours as needed for severe pain. 11/12/19  Yes Plotnikov, Evie Lacks, MD  HYDROcodone-acetaminophen (NORCO) 7.5-325 MG tablet Take 1 tablet by mouth every 6 (six) hours as needed for severe pain. 11/12/19  Yes Plotnikov, Evie Lacks, MD  HYDROcodone-acetaminophen (NORCO) 7.5-325 MG tablet Take 1 tablet by mouth every 6 (six) hours as needed for severe pain. 11/12/19  Yes Plotnikov, Evie Lacks, MD  hydrocortisone (ANUCORT-HC) 25 MG suppository UNWRAP AND INSERT 1  SUPPOSITORY RECTALLY TWICE   DAILY 04/11/19  Yes Jackquline Denmark, MD  LINZESS 72 MCG capsule TAKE 1 CAPSULE BY MOUTH  DAILY BEFORE BREAKFAST 03/11/19  Yes Jackquline Denmark, MD  Melatonin 10 MG TABS Take 10 mg by mouth at bedtime as needed (for sleep).    Yes [provider]  montelukast (SINGULAIR) 10 MG tablet TAKE 1 TABLET BY MOUTH  DAILY 03/10/19  Yes Plotnikov, Evie Lacks, MD  Multiple Vitamin (MULTIVITAMIN WITH MINERALS) TABS tablet Take 1 tablet by mouth daily. COMPLETE MULTIVITAMIN MEN'S 50+  Yes [provider]  polyethylene glycol powder (GLYCOLAX/MIRALAX) powder Take 17 g by mouth daily as needed for moderate constipation.    Yes [provider]  potassium chloride (K-DUR) 10 MEQ tablet Take 1 tablet (10 mEq total) by mouth daily. 01/23/19  Yes Plotnikov, Evie Lacks, MD  potassium chloride (K-DUR) 10 MEQ tablet TAKE 1 TABLET BY MOUTH  DAILY 03/10/19  Yes Plotnikov, Evie Lacks, MD  RESTASIS 0.05 % ophthalmic emulsion Place 1 drop into both eyes 2 (two) times daily. 10/25/15  Yes [provider]  tadalafil (CIALIS) 5 MG tablet TAKE 1 TABLET EVERY DAY 03/21/19  Yes Plotnikov, Evie Lacks, MD  testosterone enanthate (DELATESTRYL) 200 MG/ML injection INJECT 1 ML INTRAMUSCULARLY EVERY 14 DAYS . DISCARD 28 DAYS AFTER INITIAL USE. 05/27/19  Yes Plotnikov, Evie Lacks, MD  alfuzosin (UROXATRAL) 10 MG 24 hr tablet Take 10 mg by mouth at bedtime.     [provider]  atorvastatin (LIPITOR) 40 MG tablet Take 40 mg by mouth at bedtime.  11/29/15 11/11/18  [provider]  mirabegron ER (MYRBETRIQ) 25 MG TB24 tablet Take 25 mg by mouth at bedtime.     [provider]     Positive ROS: Otherwise negative  All other systems have been reviewed and were otherwise negative with the exception of those mentioned in the HPI and as above.  Physical Exam: Constitutional: Alert, well-appearing, no acute distress Ears: External ears without lesions or tenderness.  Left ear canal and left TM are  clear.  Right ear canal reveals a scab or crust or blood clot deep within the right ear canal adjacent to the TM on the inferior aspect of the ear canal.  This was removed with hydroperoxide and a hook and forceps.  The TM was intact and clear there is a very small abrasion of the ear canal but otherwise ear canals clear.  No active bleeding noted after removing the blood clot. Nasal: External nose without lesion. Clear nasal passages Oral: Lips and gums without lesions. Tongue and palate mucosa without lesions. Posterior oropharynx clear. Neck: No palpable adenopathy or masses Respiratory: Breathing comfortably  Skin: No facial/neck lesions or rash noted.  Cerumen impaction removal  Date/Time: 02/27/2020 1:14 PM Performed by: Rozetta Nunnery, MD Authorized by: Rozetta Nunnery, MD   Consent:    Consent obtained:  Verbal   Consent given by:  Patient   Risks discussed:  Pain and bleeding Procedure details:    Location:  R ear   Procedure type: curette, suction and forceps   Post-procedure details:    Inspection:  TM intact and canal normal   Hearing quality:  Improved   Patient tolerance of procedure:  Tolerated well, no immediate complications Comments:     Left ear canal and left TM are clear.  Right ear canal reveals a blood clot deep within the right ear canal that was removed in the office.  The TM was intact and clear patient small abrasion of the right ear canal but no active bleeding noted.    Assessment: Right ear canal abrasion with small blood clot that was removed in the office. Intact clear TM.  Plan: Patient is doing well today with no symptoms. He will follow-up as needed.   Radene Journey, MD

## 2020-03-11 NOTE — Telephone Encounter (Signed)
    Patient calling for status of refill request

## 2020-03-13 ENCOUNTER — Other Ambulatory Visit: Payer: Self-pay | Admitting: Internal Medicine

## 2020-03-15 MED ORDER — TADALAFIL 5 MG PO TABS: ORAL_TABLET | ORAL | 1 refills | Status: DC

## 2020-04-16 ENCOUNTER — Other Ambulatory Visit: Payer: Self-pay | Admitting: Internal Medicine

## 2020-04-16 NOTE — Telephone Encounter (Signed)
Patient called and is requesting refill for testosterone enanthate (DELATESTRYL) 200 MG/ML injection  It can be sent to [facility], [place] number is [phone].

## 2020-04-17 ENCOUNTER — Other Ambulatory Visit: Payer: Self-pay | Admitting: Gastroenterology

## 2020-04-17 ENCOUNTER — Other Ambulatory Visit: Payer: Self-pay | Admitting: Internal Medicine

## 2020-04-19 ENCOUNTER — Other Ambulatory Visit: Payer: Self-pay | Admitting: Internal Medicine

## 2020-04-19 MED ORDER — TESTOSTERONE ENANTHATE 200 MG/ML IM SOLN: INTRAMUSCULAR | 3 refills | Status: DC

## 2020-04-21 ENCOUNTER — Other Ambulatory Visit: Payer: Self-pay | Admitting: Internal Medicine

## 2020-04-21 ENCOUNTER — Other Ambulatory Visit: Payer: Self-pay | Admitting: Gastroenterology

## 2020-04-23 NOTE — Telephone Encounter (Signed)
[facility] has to have a verbal order since this is a controlled substance

## 2020-04-27 ENCOUNTER — Ambulatory Visit (INDEPENDENT_AMBULATORY_CARE_PROVIDER_SITE_OTHER): Payer: Medicare Other | Admitting: Gastroenterology

## 2020-04-27 ENCOUNTER — Encounter: Payer: Self-pay | Admitting: Gastroenterology

## 2020-04-27 VITALS — BP 140/76 | HR 59 | Ht 70.0 in | Wt 144.1 lb

## 2020-04-27 DIAGNOSIS — K602 Anal fissure, unspecified: Secondary | ICD-10-CM

## 2020-04-27 DIAGNOSIS — K5900 Constipation, unspecified: Secondary | ICD-10-CM

## 2020-04-27 MED ORDER — HYDROCORTISONE ACETATE 25 MG RE SUPP: RECTAL | 0 refills | Status: DC

## 2020-04-27 MED ORDER — LINACLOTIDE 72 MCG PO CAPS: ORAL_CAPSULE | ORAL | 11 refills | Status: DC

## 2020-04-27 NOTE — Patient Instructions (Signed)
If you are age 70 or older, your body mass index should be between 23-30. Your Body mass index is 20.68 kg/m. If this is out of the aforementioned range listed, please consider follow up with your Primary Care Provider.  If you are age 16 or younger, your body mass index should be between 19-25. Your Body mass index is 20.68 kg/m. If this is out of the aformentioned range listed, please consider follow up with your Primary Care Provider.   Please purchase the following medications over the counter and take as directed: Metamucil twice daily   We have sent the following medications to your pharmacy for you to pick up at your convenience: Anucort Linzess   Thank you,  Dr. Jackquline Denmark

## 2020-04-27 NOTE — Progress Notes (Signed)
Chief Complaint: FU  Referring Provider:  Cassandria Anger, MD      ASSESSMENT AND PLAN;   #1. Anal fissure and internal hemorrhoids.  #2. H/O constipation (uses hydrocodone, Celebrex), neg colon 09/2018 except for fair quality of preparation.  Plan: - Minimize use of pain medications - HC 2.5 crean BID x 14 days - [place] 2.5% suppostories bid x 14 days. - Increased Metamucil BID weight 8 ounces of water. - Linzess samples 75 mcg 1 p.o. once a day #30, 11 refills. - Recall colon 08/2023 - Call in 2 weeks. If still with problems, NTG cream/Cardizem cream.    HPI:    Troy Marsh is a 69 y.o. male  With HIV, HTN, OA S/P neck Sx ACD  and right inguinal hernia repair on chronic pain medications For follow-up visit Doing very well except for anal pain occasionally on defecation.  No significant rectal bleeding.  Rectal exam today showed healing posterior anal fissure, internal hemorrhoids.  He has been responding well to hydrocortisone cream and suppositories.  Continues to have constipation with BMs 2-3 in a week, occasional hard stools.  Has been taking Metamucil once a day.  Has minimized pain medications.  Does drink plenty of water.  Previous colonoscopy March 2020 as below.  Married to [employer] (patient of ours)  Previous GI procedures: Colonoscopy 09/2018 - Non-bleeding internal hemorrhoids. - Otherwise grossly normal colonoscopy. - Repeat in 5 years d/t HIV, quality of prep  Past Medical History:  Diagnosis Date  . Arthritis   . BPH (benign prostatic hyperplasia)    [facility]  DR. Elie Goody  . Cancer ([place])    Skin cancer- basal and squamous  . Constipation due to pain medication   . HIV disease ([place]) 1985   last CD4 10/02/12 was 277  . Hypertension   . Hypogonadism male     Past Surgical History:  Procedure Laterality Date  . ANTERIOR CERVICAL DECOMP/DISCECTOMY FUSION Bilateral 11/22/2017   Procedure: ANTERIOR CERVICAL  DECOMPRESSION FUSION CERVICAL 4-5, CERVICAL 5-6, CERVICAL 6-7 WITH INSTRUMENTATION AND ALLOGRAFT TIME      /   REQUESTED 4 HOURS;  Surgeon: Phylliss Bob, MD;  Location: [place];  Service: Orthopedics;  Laterality: Bilateral;  . BACK SURGERY    . COLONOSCOPY W/ POLYPECTOMY    . EYE SURGERY Bilateral    Lasik, Cataract  . [place]  . INGUINAL HERNIA REPAIR Right 06/11/2018   Procedure: LAPAROSCOPIC RIGHT INGUINAL HERNIA WITH MESH;  Surgeon: Ralene Ok, MD;  Location: [place];  Service: General;  Laterality: Right;  . INSERTION OF MESH Right 06/11/2018   Procedure: INSERTION OF MESH;  Surgeon: Ralene Ok, MD;  Location: [place];  Service: General;  Laterality: Right;  . KNEE ARTHROSCOPY Right 2009  . KNEE ARTHROSCOPY Left 2011  . neck surgery  1984  . POSTERIOR CERVICAL FUSION/FORAMINOTOMY N/A 05/29/2018   Procedure: POSTERIOR CERVICAL DECOMPRESSION FUSION CERVICAL 4 - CERVICAL 7 WITH INSTRUMENTATION AND ALLOGRAFT;  Surgeon: Phylliss Bob, MD;  Location: [place];  Service: Orthopedics;  Laterality: N/A;    Family History  Problem Relation Age of Onset  . Coronary artery disease Father   . Heart attack Father   . Hypertension Father   . Hyperlipidemia Father   . Cancer Father        prostate  . Colitis Maternal Grandmother   . Diabetes Neg Hx   . Colon cancer Neg Hx   . Rectal cancer Neg Hx   .  Stomach cancer Neg Hx     Social History   Tobacco Use  . Smoking status: Former Smoker    Packs/day: 2.00    Years: 35.00    Pack years: 70.00    Types: Cigarettes    Quit date: 02/10/2004    Years since quitting: 16.2  . Smokeless tobacco: Never Used  Vaping Use  . Vaping Use: Never used  Substance Use Topics  . Alcohol use: No    Comment: quit 2004  . Drug use: No    Current Outpatient Medications  Medication Sig Dispense Refill  . aspirin 81 MG tablet Take 81 mg by mouth daily.      [name] atorvastatin (LIPITOR) 40 MG tablet Take 40 mg by mouth at bedtime.      . baclofen (LIORESAL) 10 MG tablet TAKE 1 TABLET BY MOUTH 3  TIMES DAILY AS NEEDED FOR  MUSCLE SPASM(S) 270 tablet 0  . BD DISP NEEDLES 21G X 1-1/2" MISC USE AS DIRECTED 100 each 0  . celecoxib (CELEBREX) 200 MG capsule TAKE 1 CAPSULE BY MOUTH  TWICE DAILY 180 capsule 3  . cetirizine-pseudoephedrine (ZYRTEC-D ALLERGY & CONGESTION) 5-120 MG tablet Take 1 tablet by mouth daily as needed for allergies.     [name] EDEX 20 MCG injection INJECT (INTRACAVERNOUS)  20MCG AS NEEDED FOR  ERECTILE DYSFUNCTION. USE  NO MORE THAN 3 TIMES PER  WEEK 3 each 3  . emtricitabine-rilpivir-tenofovir AF (ODEFSEY) 200-25-25 MG TABS tablet Take 1 tablet by mouth daily.    [name] gabapentin (NEURONTIN) 100 MG capsule TAKE 1 CAPSULE BY MOUTH 3  TIMES DAILY 270 capsule 3  . hydrochlorothiazide (HYDRODIURIL) 50 MG tablet TAKE 1 TABLET BY MOUTH  DAILY 90 tablet 3  . HYDROcodone-acetaminophen (NORCO) 7.5-325 MG tablet Take 1 tablet by mouth every 6 (six) hours as needed for severe pain. 120 tablet 0  . hydrocortisone (ANUCORT-HC) 25 MG suppository UNWRAP AND INSERT 1  SUPPOSITORY RECTALLY TWICE  DAILY 60 suppository 0  . LINZESS 72 MCG capsule TAKE 1 CAPSULE BY MOUTH  DAILY BEFORE BREAKFAST 30 capsule 0  . Melatonin 10 MG TABS Take 10 mg by mouth at bedtime as needed (for sleep).     . mirabegron ER (MYRBETRIQ) 25 MG TB24 tablet Take 25 mg by mouth at bedtime.     . montelukast (SINGULAIR) 10 MG tablet TAKE 1 TABLET BY MOUTH  DAILY 90 tablet 3  . Multiple Vitamin (MULTIVITAMIN WITH MINERALS) TABS tablet Take 1 tablet by mouth daily. COMPLETE MULTIVITAMIN MEN'S 50+    . polyethylene glycol powder (GLYCOLAX/MIRALAX) powder Take 17 g by mouth daily as needed for moderate constipation.     . potassium chloride (K-DUR) 10 MEQ tablet TAKE 1 TABLET BY MOUTH  DAILY 90 tablet 3  . RESTASIS 0.05 % ophthalmic emulsion Place 1 drop into both eyes 2 (two) times daily.    . tadalafil (CIALIS) 5 MG tablet TAKE 1 TABLET EVERY DAY 90 tablet 1  .  testosterone enanthate (DELATESTRYL) 200 MG/ML injection For IM use only 5 mL 3   No current facility-administered medications for this visit.    No Known Allergies  Review of Systems:  Negative except for HPI.    Physical Exam:    BP 140/76   Pulse (!) 59   Ht 5\' 10"  (1.778 m)   Wt 144 lb 2 oz (65.4 kg)   BMI 20.68 kg/m  Filed Weights   04/27/20 1408  Weight: 144 lb 2 oz (65.4 kg)  Constitutional:  Well-developed, in no acute distress. Psychiatric: Normal mood and affect. Behavior is normal. HEENT: Pupils normal.  Conjunctivae are normal. No scleral icterus. Cardiovascular: Normal rate, regular rhythm. No edema Pulmonary/chest: Effort normal and breath sounds normal. No wheezing, rales or rhonchi. Abdominal: Soft, nondistended. Nontender. Bowel sounds active throughout. There are no masses palpable. No hepatomegaly. Rectal: Small healing posterior anal fissure, small internal hemorrhoids, stool brown heme-negative.  Examined in presence of Trisha. Neurological: Alert and oriented to person place and time. Skin: Skin is warm and dry. No rashes noted.  Data Reviewed: I have personally reviewed following labs and imaging studies  CBC: CBC Latest Ref Rng & Units 06/03/2018 05/29/2018 04/24/2018  WBC 4.0 - 10.5 K/uL 5.4 5.1 4.8  Hemoglobin 13.0 - 17.0 g/dL 14.5 16.8 17.2(H)  Hematocrit 39 - 52 % 42.6 48.2 48.6  Platelets 150 - 400 K/uL 141(L) 156 189.0    CMP: CMP Latest Ref Rng & Units 06/03/2018 05/29/2018 04/24/2018  Glucose 70 - 99 mg/dL 96 96 96  BUN 8 - 23 mg/dL 14 10 13   Creatinine 0.61 - 1.24 mg/dL 1.05 1.27(H) 1.21  Sodium 135 - 145 mmol/L 134(L) 136 135  Potassium 3.5 - 5.1 mmol/L 4.4 3.2(L) 3.8  Chloride 98 - 111 mmol/L 98 97(L) 94(L)  CO2 22 - 32 mmol/L 31 34(H) 35(H)  Calcium 8.9 - 10.3 mg/dL 9.2 9.0 9.4  Total Protein 6.5 - 8.1 g/dL - 5.7(L) 6.1  Total Bilirubin 0.3 - 1.2 mg/dL - 1.0 0.8  Alkaline Phos 38 - 126 U/L - 48 52  AST 15 - 41 U/L - 20 19   ALT 0 - 44 U/L - 19 15  I spent 25 minutes of face-to-face time with the patient. Greater than 50% of the time was spent counseling and coordinating care.     Carmell Austria, MD 04/27/2020, 2:38 PM  Cc: Plotnikov, Evie Lacks, MD

## 2020-04-27 NOTE — Telephone Encounter (Signed)
Called [facility] to verify msg. Spoke w/tech she states they did received fax for the Testosterone but it only states IM. Pharmacist requesting sig. Spoke w/pharmacist Sarah gave verbal for sig per history to take 1 ml every 14 days. Updated script w/sig.Marland KitchenJohny Chess

## 2020-05-10 ENCOUNTER — Other Ambulatory Visit: Payer: Self-pay | Admitting: Gastroenterology

## 2020-05-10 MED ORDER — LINACLOTIDE 72 MCG PO CAPS: ORAL_CAPSULE | ORAL | 3 refills | Status: DC

## 2020-05-10 MED ORDER — HYDROCORTISONE ACETATE 25 MG RE SUPP: RECTAL | 3 refills | Status: DC

## 2020-05-11 ENCOUNTER — Telehealth: Payer: Self-pay | Admitting: Internal Medicine

## 2020-05-11 NOTE — Telephone Encounter (Signed)
LVM for pt to rtn my call to schedule AWV with [facility].  

## 2020-05-13 ENCOUNTER — Other Ambulatory Visit: Payer: Self-pay

## 2020-05-13 ENCOUNTER — Encounter: Payer: Self-pay | Admitting: Internal Medicine

## 2020-05-13 ENCOUNTER — Other Ambulatory Visit: Payer: Self-pay | Admitting: Gastroenterology

## 2020-05-13 ENCOUNTER — Ambulatory Visit (INDEPENDENT_AMBULATORY_CARE_PROVIDER_SITE_OTHER): Payer: Medicare Other | Admitting: Internal Medicine

## 2020-05-13 VITALS — BP 134/72 | HR 92 | Temp 98.7°F | Wt 144.0 lb

## 2020-05-13 DIAGNOSIS — I10 Essential (primary) hypertension: Secondary | ICD-10-CM | POA: Diagnosis not present

## 2020-05-13 DIAGNOSIS — E291 Testicular hypofunction: Secondary | ICD-10-CM

## 2020-05-13 DIAGNOSIS — Z0000 Encounter for general adult medical examination without abnormal findings: Secondary | ICD-10-CM

## 2020-05-13 DIAGNOSIS — G8929 Other chronic pain: Secondary | ICD-10-CM

## 2020-05-13 DIAGNOSIS — M5440 Lumbago with sciatica, unspecified side: Secondary | ICD-10-CM

## 2020-05-13 DIAGNOSIS — M5441 Lumbago with sciatica, right side: Secondary | ICD-10-CM

## 2020-05-13 LAB — CBC WITH DIFFERENTIAL/PLATELET
Basophils Absolute: 0 10*3/uL (ref 0.0–0.1)
Basophils Relative: 0.9 % (ref 0.0–3.0)
Eosinophils Absolute: 0 10*3/uL (ref 0.0–0.7)
Eosinophils Relative: 0.5 % (ref 0.0–5.0)
HCT: 48 % (ref 39.0–52.0)
Hemoglobin: 16.7 g/dL (ref 13.0–17.0)
Lymphocytes Relative: 23 % (ref 12.0–46.0)
Lymphs Abs: 1 10*3/uL (ref 0.7–4.0)
MCHC: 34.8 g/dL (ref 30.0–36.0)
MCV: 93.7 fl (ref 78.0–100.0)
Monocytes Absolute: 0.4 10*3/uL (ref 0.1–1.0)
Monocytes Relative: 9.5 % (ref 3.0–12.0)
Neutro Abs: 2.7 10*3/uL (ref 1.4–7.7)
Neutrophils Relative %: 66.1 % (ref 43.0–77.0)
Platelets: 151 10*3/uL (ref 150.0–400.0)
RBC: 5.13 Mil/uL (ref 4.22–5.81)
RDW: 13.4 % (ref 11.5–15.5)
WBC: 4.2 10*3/uL (ref 4.0–10.5)

## 2020-05-13 LAB — URINALYSIS
Bilirubin Urine: NEGATIVE
Hgb urine dipstick: NEGATIVE
Ketones, ur: NEGATIVE
Leukocytes,Ua: NEGATIVE
Nitrite: NEGATIVE
Specific Gravity, Urine: 1.01 (ref 1.000–1.030)
Total Protein, Urine: NEGATIVE
Urine Glucose: NEGATIVE
Urobilinogen, UA: 0.2 (ref 0.0–1.0)
pH: 7 (ref 5.0–8.0)

## 2020-05-13 LAB — COMPREHENSIVE METABOLIC PANEL
ALT: 17 U/L (ref 0–53)
AST: 23 U/L (ref 0–37)
Albumin: 4.3 g/dL (ref 3.5–5.2)
Alkaline Phosphatase: 52 U/L (ref 39–117)
BUN: 11 mg/dL (ref 6–23)
CO2: 34 mEq/L — ABNORMAL HIGH (ref 19–32)
Calcium: 9.1 mg/dL (ref 8.4–10.5)
Chloride: 96 mEq/L (ref 96–112)
Creatinine, Ser: 1.21 mg/dL (ref 0.40–1.50)
GFR: 60.76 mL/min (ref >60.00)
Glucose, Bld: 63 mg/dL — ABNORMAL LOW (ref 70–99)
Potassium: 4 mEq/L (ref 3.5–5.1)
Sodium: 137 mEq/L (ref 135–145)
Total Bilirubin: 1 mg/dL (ref 0.2–1.2)
Total Protein: 5.9 g/dL — ABNORMAL LOW (ref 6.0–8.3)

## 2020-05-13 LAB — LIPID PANEL
Cholesterol: 131 mg/dL (ref 0–200)
HDL: 71.9 mg/dL (ref >39.00)
LDL Cholesterol: 49 mg/dL (ref 0–99)
NonHDL: 59.19
Total CHOL/HDL Ratio: 2
Triglycerides: 52 mg/dL (ref 0.0–149.0)
VLDL: 10.4 mg/dL (ref 0.0–40.0)

## 2020-05-13 LAB — HEPATIC FUNCTION PANEL
ALT: 17 U/L (ref 0–53)
AST: 23 U/L (ref 0–37)
Albumin: 4.3 g/dL (ref 3.5–5.2)
Alkaline Phosphatase: 52 U/L (ref 39–117)
Bilirubin, Direct: 0.2 mg/dL (ref 0.0–0.3)
Total Bilirubin: 1 mg/dL (ref 0.2–1.2)
Total Protein: 5.9 g/dL — ABNORMAL LOW (ref 6.0–8.3)

## 2020-05-13 LAB — TESTOSTERONE: Testosterone: 451.42 ng/dL (ref 300.00–890.00)

## 2020-05-13 LAB — TSH: TSH: 2.14 u[IU]/mL (ref 0.35–4.50)

## 2020-05-13 LAB — PSA: PSA: 1.18 ng/mL (ref 0.10–4.00)

## 2020-05-13 MED ORDER — BACLOFEN 20 MG PO TABS: 20.0000 mg | ORAL_TABLET | Freq: Three times a day (TID) | ORAL | 3 refills | Status: DC

## 2020-05-13 NOTE — Assessment & Plan Note (Signed)
HCTZ Kdur

## 2020-05-13 NOTE — Addendum Note (Signed)
Addended by: Cresenciano Lick on: 05/13/2020 08:54 AM   Modules accepted: Orders

## 2020-05-13 NOTE — Assessment & Plan Note (Signed)
Norco  Potential benefits of a long term opioids use as well as potential risks (i.e. addiction risk, apnea etc) and complications (i.e. Somnolence, constipation and others) were explained to the patient and were aknowledged. 

## 2020-05-13 NOTE — Progress Notes (Signed)
Subjective:  Patient ID: Troy Marsh, male    DOB: 11-14-1950  Age: 69 y.o. MRN: [id number]  CC: Follow-up (3 month F/U) and Medication Refill (Pt stattes he takes 20 mg on his Baclofen want to discuss)   HPI Troy Marsh presents for LBP, OA, dyslipidemia f/u  Outpatient Medications Prior to Visit  Medication Sig Dispense Refill  . aspirin 81 MG tablet Take 81 mg by mouth daily.      . BD DISP NEEDLES 21G X 1-1/2" MISC USE AS DIRECTED 100 each 0  . celecoxib (CELEBREX) 200 MG capsule TAKE 1 CAPSULE BY MOUTH  TWICE DAILY 180 capsule 3  . cetirizine-pseudoephedrine (ZYRTEC-D ALLERGY & CONGESTION) 5-120 MG tablet Take 1 tablet by mouth daily as needed for allergies.     [name] EDEX 20 MCG injection INJECT (INTRACAVERNOUS)  20MCG AS NEEDED FOR  ERECTILE DYSFUNCTION. USE  NO MORE THAN 3 TIMES PER  WEEK 3 each 3  . emtricitabine-rilpivir-tenofovir AF (ODEFSEY) 200-25-25 MG TABS tablet Take 1 tablet by mouth daily.    [name] gabapentin (NEURONTIN) 100 MG capsule TAKE 1 CAPSULE BY MOUTH 3  TIMES DAILY 270 capsule 3  . hydrochlorothiazide (HYDRODIURIL) 50 MG tablet TAKE 1 TABLET BY MOUTH  DAILY 90 tablet 3  . HYDROcodone-acetaminophen (NORCO) 7.5-325 MG tablet Take 1 tablet by mouth every 6 (six) hours as needed for severe pain. 120 tablet 0  . hydrocortisone (ANUCORT-HC) 25 MG suppository UNWRAP AND INSERT 1  SUPPOSITORY RECTALLY TWICE  DAILY 90 suppository 3  . linaclotide (LINZESS) 72 MCG capsule TAKE 1 CAPSULE BY MOUTH  DAILY BEFORE BREAKFAST 90 capsule 3  . Melatonin 10 MG TABS Take 10 mg by mouth at bedtime as needed (for sleep).     . mirabegron ER (MYRBETRIQ) 25 MG TB24 tablet Take 25 mg by mouth at bedtime.     . montelukast (SINGULAIR) 10 MG tablet TAKE 1 TABLET BY MOUTH  DAILY 90 tablet 3  . Multiple Vitamin (MULTIVITAMIN WITH MINERALS) TABS tablet Take 1 tablet by mouth daily. COMPLETE MULTIVITAMIN MEN'S 50+    . polyethylene glycol powder (GLYCOLAX/MIRALAX) powder Take 17 g by mouth  daily as needed for moderate constipation.     . potassium chloride (K-DUR) 10 MEQ tablet TAKE 1 TABLET BY MOUTH  DAILY 90 tablet 3  . RESTASIS 0.05 % ophthalmic emulsion Place 1 drop into both eyes 2 (two) times daily.    . tadalafil (CIALIS) 5 MG tablet TAKE 1 TABLET EVERY DAY 90 tablet 1  . testosterone enanthate (DELATESTRYL) 200 MG/ML injection For IM use only (Patient taking differently: Inject 200 mg into the muscle every 14 (fourteen) days. For IM use only) 5 mL 3  . atorvastatin (LIPITOR) 40 MG tablet Take 40 mg by mouth at bedtime.     . baclofen (LIORESAL) 10 MG tablet TAKE 1 TABLET BY MOUTH 3  TIMES DAILY AS NEEDED FOR  MUSCLE SPASM(S) (Patient not taking: Reported on 05/13/2020) 270 tablet 0   No facility-administered medications prior to visit.    ROS: Review of Systems  Constitutional: Negative for appetite change, fatigue and unexpected weight change.  HENT: Negative for congestion, nosebleeds, sneezing, sore throat and trouble swallowing.   Eyes: Negative for itching and visual disturbance.  Respiratory: Negative for cough.   Cardiovascular: Negative for chest pain, palpitations and leg swelling.  Gastrointestinal: Negative for abdominal distention, blood in stool, diarrhea and nausea.  Genitourinary: Negative for frequency and hematuria.  Musculoskeletal: Positive for back pain.  Negative for gait problem, joint swelling and neck pain.  Skin: Negative for rash.  Neurological: Negative for dizziness, tremors, speech difficulty and weakness.  Psychiatric/Behavioral: Negative for agitation, dysphoric mood and sleep disturbance. The patient is not nervous/anxious.     Objective:  BP 134/72 (BP Location: Left Arm)   Pulse 92   Temp 98.7 F (37.1 C) (Oral)   Wt 144 lb (65.3 kg)   SpO2 95%   BMI 20.66 kg/m   BP Readings from Last 3 Encounters:  05/13/20 134/72  04/27/20 140/76  02/10/20 (!) 148/90    Wt Readings from Last 3 Encounters:  05/13/20 144 lb (65.3 kg)    04/27/20 144 lb 2 oz (65.4 kg)  02/10/20 140 lb (63.5 kg)    Physical Exam Constitutional:      General: He is not in acute distress.    Appearance: He is well-developed.     Comments: NAD  Eyes:     Conjunctiva/sclera: Conjunctivae normal.     Pupils: Pupils are equal, round, and reactive to light.  Neck:     Thyroid: No thyromegaly.     Vascular: No JVD.  Cardiovascular:     Rate and Rhythm: Normal rate and regular rhythm.     Heart sounds: Normal heart sounds. No murmur heard.  No friction rub. No gallop.   Pulmonary:     Effort: Pulmonary effort is normal. No respiratory distress.     Breath sounds: Normal breath sounds. No wheezing or rales.  Chest:     Chest wall: No tenderness.  Abdominal:     General: Bowel sounds are normal. There is no distension.     Palpations: Abdomen is soft. There is no mass.     Tenderness: There is no abdominal tenderness. There is no guarding or rebound.  Musculoskeletal:        General: No tenderness. Normal range of motion.     Cervical back: Normal range of motion.  Lymphadenopathy:     Cervical: No cervical adenopathy.  Skin:    General: Skin is warm and dry.     Findings: No rash.  Neurological:     Mental Status: He is alert and oriented to person, place, and time.     Cranial Nerves: No cranial nerve deficit.     Motor: No abnormal muscle tone.     Coordination: Coordination normal.     Gait: Gait normal.     Deep Tendon Reflexes: Reflexes are normal and symmetric.  Psychiatric:        Behavior: Behavior normal.        Thought Content: Thought content normal.        Judgment: Judgment normal.   AKs thin  Lab Results  Component Value Date   WBC 5.4 06/03/2018   HGB 14.5 06/03/2018   HCT 42.6 06/03/2018   PLT 141 (L) 06/03/2018   GLUCOSE 96 06/03/2018   CHOL 160 04/24/2017   TRIG 61.0 04/24/2017   HDL 80.70 04/24/2017   LDLDIRECT 113.3 05/21/2006   LDLCALC 67 04/24/2017   ALT 19 05/29/2018   AST 20 05/29/2018    NA 134 (L) 06/03/2018   K 4.4 06/03/2018   CL 98 06/03/2018   CREATININE 1.05 06/03/2018   BUN 14 06/03/2018   CO2 31 06/03/2018   TSH 3.21 10/23/2017   PSA 3.47 04/24/2018   INR 1.00 05/29/2018    DG INJECT DIAG/THERA/INC NEEDLE/CATH/PLC EPI/LUMB/SAC W/IMG  Result Date: 07/07/2019 CLINICAL DATA:  Chronic low back pain.  Displacement L4-5 lumbar disc. Left lower extremity radiculopathy. Patient has had excellent relief from prior injections. His pain has recurred. FLUOROSCOPY TIME:  Radiation Exposure Index (as provided by the fluoroscopic device): 16.95 uGy*m2 PROCEDURE: The procedure, risks, benefits, and alternatives were explained to the patient. Questions regarding the procedure were encouraged and answered. The patient understands and consents to the procedure. LUMBAR EPIDURAL INJECTION: An interlaminar approach was performed on left at L4-5. The overlying skin was cleansed and anesthetized. A 20 gauge epidural needle was advanced using loss-of-resistance technique. DIAGNOSTIC EPIDURAL INJECTION: Injection of Isovue-M 200 shows a good epidural pattern with spread above and below the level of needle placement, primarily on the left no vascular opacification is seen. THERAPEUTIC EPIDURAL INJECTION: 120 mg of Depo-Medrol mixed with 3 mL 1% lidocaine were instilled. The procedure was well-tolerated, and the patient was discharged thirty minutes following the injection in good condition. COMPLICATIONS: None IMPRESSION: Technically successful epidural injection on the left L4-5 # 3 Electronically Signed   By: San Morelle M.D.   On: 07/07/2019 09:07    Assessment & Plan:   There are no diagnoses linked to this encounter.   No orders of the defined types were placed in this encounter.    Follow-up: No follow-ups on file.  [name], MD

## 2020-05-13 NOTE — Assessment & Plan Note (Signed)
Testosterone replacment q 2 weeks IM and using caverject  Potential benefits of a long term testosterone use as well as potential risks  and complications were explained to the patient and were aknowledged.

## 2020-05-14 NOTE — Telephone Encounter (Signed)
Would you like this medication refilled?

## 2020-05-18 ENCOUNTER — Ambulatory Visit (INDEPENDENT_AMBULATORY_CARE_PROVIDER_SITE_OTHER): Payer: Medicare Other

## 2020-05-18 DIAGNOSIS — Z0000 Encounter for general adult medical examination without abnormal findings: Secondary | ICD-10-CM | POA: Diagnosis not present

## 2020-05-18 NOTE — Patient Instructions (Signed)
[name] , Thank you for taking time to come for your Medicare Wellness Visit. I appreciate your ongoing commitment to your health goals. Please review the following plan we discussed and let me know if I can assist you in the future.   Screening recommendations/referrals: Colonoscopy: 09/20/2018; due every 2 years (per Dr. Lyndel Safe) Recommended yearly ophthalmology/optometry visit for glaucoma screening and checkup Recommended yearly dental visit for hygiene and checkup  Vaccinations: Influenza vaccine: 03/31/2020 Pneumococcal vaccine: up to date Tdap vaccine: 12/28/2016 Shingles vaccine: never done   Covid-19: up to date  Advanced directives: Please bring a copy of your health care power of attorney and living will to the office at your convenience.  Conditions/risks identified: Yes; Reviewed health maintenance screenings with patient today and relevant education, vaccines, and/or referrals were provided. Please continue to do your personal lifestyle choices by: daily care of teeth and gums, regular physical activity (goal should be 5 days a week for 30 minutes), eat a healthy diet, avoid tobacco and drug use, limiting any alcohol intake, taking a low-dose aspirin (if not allergic or have been advised by your provider otherwise) and taking vitamins and minerals as recommended by your provider. Continue doing brain stimulating activities (puzzles, reading, adult coloring books, staying active) to keep memory sharp. Continue to eat heart healthy diet (full of fruits, vegetables, whole grains, lean protein, water--limit salt, fat, and sugar intake) and increase physical activity as tolerated.  Next appointment: Please schedule your next Medicare Wellness Visit with your Nurse Health Advisor  Preventive Care 65 Years and Older, Male Preventive care refers to lifestyle choices and visits with your health care provider that can promote health and wellness. What does preventive care include?  A  yearly physical exam. This is also called an annual well check.  Dental exams once or twice a year.  Routine eye exams. Ask your health care provider how often you should have your eyes checked.  Personal lifestyle choices, including:  Daily care of your teeth and gums.  Regular physical activity.  Eating a healthy diet.  Avoiding tobacco and drug use.  Limiting alcohol use.  Practicing safe sex.  Taking low doses of aspirin every day.  Taking vitamin and mineral supplements as recommended by your health care provider. What happens during an annual well check? The services and screenings done by your health care provider during your annual well check will depend on your age, overall health, lifestyle risk factors, and family history of disease. Counseling  Your health care provider may ask you questions about your:  Alcohol use.  Tobacco use.  Drug use.  Emotional well-being.  Home and relationship well-being.  Sexual activity.  Eating habits.  History of falls.  Memory and ability to understand (cognition).  Work and work Statistician. Screening  You may have the following tests or measurements:  Height, weight, and BMI.  Blood pressure.  Lipid and cholesterol levels. These may be checked every 5 years, or more frequently if you are over 44 years old.  Skin check.  Lung cancer screening. You may have this screening every year starting at age 16 if you have a 30-pack-year history of smoking and currently smoke or have quit within the past 15 years.  Fecal occult blood test (FOBT) of the stool. You may have this test every year starting at age 73.  Flexible sigmoidoscopy or colonoscopy. You may have a sigmoidoscopy every 5 years or a colonoscopy every 10 years starting at age 70.  Prostate cancer  screening. Recommendations will vary depending on your family history and other risks.  Hepatitis C blood test.  Hepatitis B blood test.  Sexually  transmitted disease (STD) testing.  Diabetes screening. This is done by checking your blood sugar (glucose) after you have not eaten for a while (fasting). You may have this done every 1-3 years.  Abdominal aortic aneurysm (AAA) screening. You may need this if you are a current or former smoker.  Osteoporosis. You may be screened starting at age 86 if you are at high risk. Talk with your health care provider about your test results, treatment options, and if necessary, the need for more tests. Vaccines  Your health care provider may recommend certain vaccines, such as:  Influenza vaccine. This is recommended every year.  Tetanus, diphtheria, and acellular pertussis (Tdap, Td) vaccine. You may need a Td booster every 10 years.  Zoster vaccine. You may need this after age 7.  Pneumococcal 13-valent conjugate (PCV13) vaccine. One dose is recommended after age 69.  Pneumococcal polysaccharide (PPSV23) vaccine. One dose is recommended after age 33. Talk to your health care provider about which screenings and vaccines you need and how often you need them. This information is not intended to replace advice given to you by your health care provider. Make sure you discuss any questions you have with your health care provider. Document Released: 07/30/2015 Document Revised: 03/22/2016 Document Reviewed: 05/04/2015 Elsevier Interactive Patient Education  2017 [place] Prevention in the Home Falls can cause injuries. They can happen to people of all ages. There are many things you can do to make your home safe and to help prevent falls. What can I do on the outside of my home?  Regularly fix the edges of walkways and driveways and fix any cracks.  Remove anything that might make you trip as you walk through a door, such as a raised step or threshold.  Trim any bushes or trees on the path to your home.  Use bright outdoor lighting.  Clear any walking paths of anything that might  make someone trip, such as rocks or tools.  Regularly check to see if handrails are loose or broken. Make sure that both sides of any steps have handrails.  Any raised decks and porches should have guardrails on the edges.  Have any leaves, snow, or ice cleared regularly.  Use sand or salt on walking paths during winter.  Clean up any spills in your garage right away. This includes oil or grease spills. What can I do in the bathroom?  Use night lights.  Install grab bars by the toilet and in the tub and shower. Do not use towel bars as grab bars.  Use non-skid mats or decals in the tub or shower.  If you need to sit down in the shower, use a plastic, non-slip stool.  Keep the floor dry. Clean up any water that spills on the floor as soon as it happens.  Remove soap buildup in the tub or shower regularly.  Attach bath mats securely with double-sided non-slip rug tape.  Do not have throw rugs and other things on the floor that can make you trip. What can I do in the bedroom?  Use night lights.  Make sure that you have a light by your bed that is easy to reach.  Do not use any sheets or blankets that are too big for your bed. They should not hang down onto the floor.  Have a firm  chair that has side arms. You can use this for support while you get dressed.  Do not have throw rugs and other things on the floor that can make you trip. What can I do in the kitchen?  Clean up any spills right away.  Avoid walking on wet floors.  Keep items that you use a lot in easy-to-reach places.  If you need to reach something above you, use a strong step stool that has a grab bar.  Keep electrical cords out of the way.  Do not use floor polish or wax that makes floors slippery. If you must use wax, use non-skid floor wax.  Do not have throw rugs and other things on the floor that can make you trip. What can I do with my stairs?  Do not leave any items on the stairs.  Make sure  that there are handrails on both sides of the stairs and use them. Fix handrails that are broken or loose. Make sure that handrails are as long as the stairways.  Check any carpeting to make sure that it is firmly attached to the stairs. Fix any carpet that is loose or worn.  Avoid having throw rugs at the top or bottom of the stairs. If you do have throw rugs, attach them to the floor with carpet tape.  Make sure that you have a light switch at the top of the stairs and the bottom of the stairs. If you do not have them, ask someone to add them for you. What else can I do to help prevent falls?  Wear shoes that:  Do not have high heels.  Have rubber bottoms.  Are comfortable and fit you well.  Are closed at the toe. Do not wear sandals.  If you use a stepladder:  Make sure that it is fully opened. Do not climb a closed stepladder.  Make sure that both sides of the stepladder are locked into place.  Ask someone to hold it for you, if possible.  Clearly mark and make sure that you can see:  Any grab bars or handrails.  First and last steps.  Where the edge of each step is.  Use tools that help you move around (mobility aids) if they are needed. These include:  Canes.  Walkers.  Scooters.  Crutches.  Turn on the lights when you go into a dark area. Replace any light bulbs as soon as they burn out.  Set up your furniture so you have a clear path. Avoid moving your furniture around.  If any of your floors are uneven, fix them.  If there are any pets around you, be aware of where they are.  Review your medicines with your doctor. Some medicines can make you feel dizzy. This can increase your chance of falling. Ask your doctor what other things that you can do to help prevent falls. This information is not intended to replace advice given to you by your health care provider. Make sure you discuss any questions you have with your health care provider. Document  Released: 04/29/2009 Document Revised: 12/09/2015 Document Reviewed: 08/07/2014 Elsevier Interactive Patient Education  2017 [employer].

## 2020-05-18 NOTE — Progress Notes (Addendum)
Subjective:   Troy Marsh is a 69 y.o. male who presents for Medicare Annual/Subsequent preventive examination.  Review of Systems    No ROS. Medicare Wellness Visit. Cardiac Risk Factors include: advanced age (>64men, >35 women);family history of premature cardiovascular disease;hypertension;male gender Sleep Patterns: No sleep issues, feels rested on waking and sleeps 6-8 hours nightly. Home Safety/Smoke Alarms: Feels safe in home; uses home alarm. Smoke alarms in place. Living environment: 2-story home; Lives with spouse; no needs for DME; good support system. Seat Belt Safety/Bike Helmet: Wears seat belt.    Objective:    Today's Vitals   05/18/20 1558  PainSc: 0-No pain   There is no height or weight on file to calculate BMI.  Advanced Directives 05/18/2020 06/11/2018 06/03/2018 05/29/2018 05/29/2018 11/22/2017 11/13/2017  Does Patient Have a Medical Advance Directive? Yes Yes Yes Yes Yes Yes Yes  Type of Paramedic of [place];Living will [place];Living will - [place];Living will [place];Living will [place];Living will [place];Living will  Does patient want to make changes to medical advance directive? No - Patient declined No - Patient declined - No - Patient declined No - Patient declined No - Patient declined -  Copy of [place] in Chart? No - copy requested No - copy requested - No - copy requested No - copy requested No - copy requested No - copy requested    Current Medications (verified) Outpatient Encounter Medications as of 05/18/2020  Medication Sig   aspirin 81 MG tablet Take 81 mg by mouth daily.     atorvastatin (LIPITOR) 40 MG tablet Take 40 mg by mouth at bedtime.    baclofen (LIORESAL) 20 MG tablet Take 1 tablet (20 mg total) by mouth 3 (three) times daily.   BD DISP NEEDLES 21G X 1-1/2" MISC USE AS DIRECTED     celecoxib (CELEBREX) 200 MG capsule TAKE 1 CAPSULE BY MOUTH  TWICE DAILY   cetirizine-pseudoephedrine (ZYRTEC-D ALLERGY & CONGESTION) 5-120 MG tablet Take 1 tablet by mouth daily as needed for allergies.    EDEX 20 MCG injection INJECT (INTRACAVERNOUS)  20MCG AS NEEDED FOR  ERECTILE DYSFUNCTION. USE  NO MORE THAN 3 TIMES PER  WEEK   emtricitabine-rilpivir-tenofovir AF (ODEFSEY) 200-25-25 MG TABS tablet Take 1 tablet by mouth daily.   gabapentin (NEURONTIN) 100 MG capsule TAKE 1 CAPSULE BY MOUTH 3  TIMES DAILY   hydrochlorothiazide (HYDRODIURIL) 50 MG tablet TAKE 1 TABLET BY MOUTH  DAILY   HYDROcodone-acetaminophen (NORCO) 7.5-325 MG tablet Take 1 tablet by mouth every 6 (six) hours as needed for severe pain.   hydrocortisone (ANUCORT-HC) 25 MG suppository UNWRAP AND INSERT 1  SUPPOSITORY RECTALLY TWICE  DAILY   linaclotide (LINZESS) 72 MCG capsule TAKE 1 CAPSULE BY MOUTH  DAILY BEFORE BREAKFAST   Melatonin 10 MG TABS Take 10 mg by mouth at bedtime as needed (for sleep).    mirabegron ER (MYRBETRIQ) 25 MG TB24 tablet Take 25 mg by mouth at bedtime.    montelukast (SINGULAIR) 10 MG tablet TAKE 1 TABLET BY MOUTH  DAILY   Multiple Vitamin (MULTIVITAMIN WITH MINERALS) TABS tablet Take 1 tablet by mouth daily. COMPLETE MULTIVITAMIN MEN'S 50+   polyethylene glycol powder (GLYCOLAX/MIRALAX) powder Take 17 g by mouth daily as needed for moderate constipation.    potassium chloride (K-DUR) 10 MEQ tablet TAKE 1 TABLET BY MOUTH  DAILY   RESTASIS 0.05 % ophthalmic  emulsion Place 1 drop into both eyes 2 (two) times daily.   tadalafil (CIALIS) 5 MG tablet TAKE 1 TABLET EVERY DAY   testosterone enanthate (DELATESTRYL) 200 MG/ML injection For IM use only (Patient taking differently: Inject 200 mg into the muscle every 14 (fourteen) days. For IM use only)   No facility-administered encounter medications on file as of 05/18/2020.    Allergies (verified) Patient has no known allergies.   History: Past  Medical History:  Diagnosis Date   Arthritis    BPH (benign prostatic hyperplasia)    [facility]  DR. Elie Goody   Cancer [facility])    Skin cancer- basal and squamous   Constipation due to pain medication    HIV disease ([place]) 1985   last CD4 10/02/12 was 277   Hypertension    Hypogonadism male    Past Surgical History:  Procedure Laterality Date   ANTERIOR CERVICAL DECOMP/DISCECTOMY FUSION Bilateral 11/22/2017   Procedure: ANTERIOR CERVICAL DECOMPRESSION FUSION CERVICAL 4-5, CERVICAL 5-6, CERVICAL 6-7 WITH INSTRUMENTATION AND ALLOGRAFT TIME      /   REQUESTED 4 HOURS;  Surgeon: Phylliss Bob, MD;  Location: [facility];  Service: Orthopedics;  Laterality: Bilateral;   BACK SURGERY     COLONOSCOPY W/ POLYPECTOMY     EYE SURGERY Bilateral    Lasik, Cataract   HEMORRHOID SURGERY  1973   INGUINAL HERNIA REPAIR Right 06/11/2018   Procedure: LAPAROSCOPIC RIGHT INGUINAL HERNIA WITH MESH;  Surgeon: Ralene Ok, MD;  Location: [facility];  Service: General;  Laterality: Right;   INSERTION OF MESH Right 06/11/2018   Procedure: INSERTION OF MESH;  Surgeon: Ralene Ok, MD;  Location: [facility];  Service: General;  Laterality: Right;   KNEE ARTHROSCOPY Right 2009   KNEE ARTHROSCOPY Left 2011   neck surgery  1984   POSTERIOR CERVICAL FUSION/FORAMINOTOMY N/A 05/29/2018   Procedure: POSTERIOR CERVICAL DECOMPRESSION FUSION CERVICAL 4 - CERVICAL 7 WITH INSTRUMENTATION AND ALLOGRAFT;  Surgeon: Phylliss Bob, MD;  Location: [facility];  Service: Orthopedics;  Laterality: N/A;   Family History  Problem Relation Age of Onset   Coronary artery disease Father    Heart attack Father    Hypertension Father    Hyperlipidemia Father    Cancer Father        prostate   Colitis Maternal Grandmother    Diabetes Neg Hx    Colon cancer Neg Hx    Rectal cancer Neg Hx    Stomach cancer Neg Hx    Social History   Socioeconomic History   Marital status: Married    Spouse name: Troy Marsh (partner)   Number  of children: Not on file   Years of education: 16   Highest education level: Not on file  Occupational History   Occupation: retired    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 Work - [place] [employer] - retired 2009. Long term monogamous relationship. Lives in his own home.             Social Determinants of Health   Financial Resource Strain: Low Risk    Difficulty of Paying Living Expenses: Not hard at all  Food Insecurity: No Food Insecurity   Worried About Charity fundraiser in the Last Year: Never true   [place] in the Last Year: Never true  Transportation Needs: No Transportation Needs   Lack of Transportation (Medical): No   Lack of Transportation (Non-Medical): No  Physical Activity: Sufficiently Active   Days of Exercise per Week: 7 days   Minutes of Exercise per Session: 30 min  Stress: No Stress Concern Present   Feeling of Stress : Not at all  Social Connections: Socially Integrated   Frequency of Communication with Friends and Family: More than three times a week   Frequency of Social Gatherings with Friends and Family: More than three times a week   Attends Religious Services: More than 4 times per year   Active Member of [employer] or Organizations: Yes   Attends Music therapist: More than 4 times per year   Marital Status: Married    Tobacco  Counseling Counseling given: Not Answered   Clinical Intake:  Pre-visit preparation completed: Yes  Pain : 0-10 Pain Score: 0-No pain Pain Type: Chronic pain Pain Location: Back Pain Orientation: Lower Pain Descriptors / Indicators: Constant Pain Onset: More than a month ago Pain Frequency: Constant Pain Relieving Factors: Pain medication Effect of Pain on Daily Activities: Pain produces disability and affects the quality of life.  Pain Relieving Factors: Pain medication  Nutritional Risks: None Diabetes: No  How often do you need to have someone help you when you read instructions, pamphlets, or other written materials from your doctor or pharmacy?: 1 - Never What is the last grade level you completed in school?: Associate's Degree  Diabetic? no  Interpreter Needed?: No  Information entered by :: Lisette Abu, LPN   Activities of Daily Living In your present state of health, do you have any difficulty performing the following activities: 05/18/2020  Hearing? N  Vision? N  Difficulty concentrating or making decisions? N  Walking or climbing stairs? N  Dressing or bathing? N  Doing errands, shopping? N  Preparing Food and eating ? N  Using the Toilet? N  In the past six months, have you accidently leaked urine? N  Do you have problems with loss of bowel control? N  Managing your Medications? N  Managing your Finances? N  Housekeeping or managing your Housekeeping? N  Some recent data might be hidden    Patient Care Team: Plotnikov, Evie Lacks, MD as PCP - General (Internal Medicine) Almedia Balls, MD (Orthopedic Surgery) Debby Freiberg, MD as Referring Physician (Infectious Diseases) Irene Shipper, MD as Consulting Physician (Gastroenterology) Phylliss Bob, MD as Consulting Physician (Orthopedic Surgery)  Indicate any recent Medical Services you may have received from other than [facility] providers in the past year (date may be approximate).     Assessment:    This is a routine wellness examination for [place].  Hearing/Vision screen No exam data present  Dietary issues and exercise activities discussed: Current Exercise Habits: Home exercise routine;Structured exercise class, Type of exercise: strength training/weights;stretching;treadmill;walking, Time (Minutes): 30, Frequency (Times/Week): 5, Weekly Exercise (Minutes/Week): 150, Intensity:  Moderate, Exercise limited by: None identified  Goals      <enter goal here>     Reduce caffeine intake     I would like to reduce my intake of coffee and drink more water instead.     Take time to relax more.     Put into my schedule to go to my rocking-chair and have complete quite time relaxation.       Depression Screen PHQ 2/9 Scores 05/18/2020 02/10/2020 04/24/2017 10/23/2016 10/27/2015  PHQ - 2 Score 0 0 0 0 0  PHQ- 9 Score - - 0 - -    Fall Risk Fall Risk  05/18/2020 02/10/2020 04/24/2017 10/23/2016 10/27/2015  Falls in the past year? 0 0 Yes No No  Number falls in past yr: 0 0 1 - -  Injury with Fall? 0 0 No - -  Risk for fall due to : No Fall Risks - - - -  Follow up Falls evaluation completed - - - -    Any stairs in or around the home? Yes  If so, are there any without handrails? No  Home free of loose throw rugs in walkways, pet beds, electrical cords, etc? Yes  Adequate lighting in your home to reduce risk of falls? Yes   ASSISTIVE DEVICES UTILIZED TO PREVENT FALLS:  Life alert? No  Use of a cane, walker or w/c? No  Grab bars in the bathroom? Yes  Shower chair or bench in shower? Yes  Elevated toilet seat or a handicapped toilet? Yes   TIMED UP AND GO:  Was the test performed? No .  Length of time to ambulate 10 feet: 0 sec.   Gait steady and fast without use of assistive device  Cognitive Function: MMSE - Mini Mental State Exam 04/24/2017  Orientation to time 5  Orientation to Place 5  Registration 3  Attention/ Calculation 5  Recall 2  Language- name 2 objects 2   Language- repeat 1  Language- follow 3 step command 3  Language- read & follow direction 1  Write a sentence 1  Copy design 1  Total score 29        Immunizations Immunization History  Administered Date(s) Administered   Influenza Split 04/20/2011   Influenza, High Dose Seasonal PF 04/24/2016, 04/24/2017, 04/24/2018, 04/22/2019   Influenza,inj,Quad PF,6+ Mos 03/30/2014, 04/12/2015   Influenza-Unspecified 03/21/2013, 04/22/2019, 05/04/2019, 03/31/2020   PFIZER SARS-COV-2 Vaccination 09/09/2019, 09/30/2019, 03/31/2020   PPD Test 07/22/2007, 11/02/2008, 09/20/2009, 02/13/2011   Pneumococcal Conjugate-13 09/30/2012   Pneumococcal Polysaccharide-23 12/05/2004, 09/20/2009, 04/24/2016   Td 12/18/2008, 04/12/2009   Tdap 06/25/2006, 12/28/2016   Zoster 07/22/2011, 04/12/2015    TDAP status: Up to date Flu Vaccine status: Up to date Pneumococcal vaccine status: Up to date Covid-19 vaccine status: Completed vaccines  Qualifies for Shingles Vaccine? Yes   Zostavax completed Yes   Shingrix Completed?: No.    Education has been provided regarding the importance of this vaccine. Patient has been advised to call insurance company to determine out of pocket expense if they have not yet received this vaccine. Advised may also receive vaccine at local pharmacy or Health Dept. Verbalized acceptance and understanding.  Screening Tests Health Maintenance  Topic Date Due   Hepatitis C Screening  Never done   TETANUS/TDAP  12/29/2026   COLONOSCOPY  09/19/2028   INFLUENZA VACCINE  Completed   COVID-19 Vaccine  Completed   PNA vac Low Risk Adult  Completed    Health Maintenance  Health Maintenance  Due  Topic Date Due   Hepatitis C Screening  Never done    Colorectal cancer screening: Completed 09/20/2018. Repeat every 2 years  Lung Cancer Screening: (Low Dose CT Chest recommended if Age 65-80 years, 30 pack-year currently smoking OR have quit w/in 15years.) does not qualify.   Lung  Cancer Screening Referral: no  Additional Screening:  Hepatitis C Screening: does qualify; Completed no  Vision Screening: Recommended annual ophthalmology exams for early detection of glaucoma and other disorders of the eye. Is the patient up to date with their annual eye exam?  Yes  Who is the provider or what is the name of the office in which the patient attends annual eye exams? Dr. Julian Reil If pt is not established with a provider, would they like to be referred to a provider to establish care? No .   Dental Screening: Recommended annual dental exams for proper oral hygiene  Community Resource Referral / Chronic Care Management: CRR required this visit?  No   CCM required this visit?  No      Plan:     I have personally reviewed and noted the following in the patient's chart:   Medical and social history Use of alcohol, tobacco or illicit drugs  Current medications and supplements Functional ability and status Nutritional status Physical activity Advanced directives List of other physicians Hospitalizations, surgeries, and ER visits in previous 12 months Vitals Screenings to include cognitive, depression, and falls Referrals and appointments  In addition, I have reviewed and discussed with patient certain preventive protocols, quality metrics, and best practice recommendations. A written personalized care plan for preventive services as well as general preventive health recommendations were provided to patient.     Sheral Flow, LPN   81/02/2992   Nurse Notes:  Patient is cogitatively intact. There were no vitals filed for this visit. There is no height or weight on file to calculate BMI. Patient stated that he has no issues with gait or balance; does not use any assistive devices.  Medical screening examination/treatment/procedure(s) were performed by non-physician practitioner and as supervising physician I was immediately available for  consultation/collaboration.  I agree with above. Lew Dawes, MD

## 2020-05-28 ENCOUNTER — Encounter (INDEPENDENT_AMBULATORY_CARE_PROVIDER_SITE_OTHER): Payer: Self-pay

## 2020-05-28 ENCOUNTER — Other Ambulatory Visit: Payer: Self-pay | Admitting: Gastroenterology

## 2020-05-28 MED ORDER — HYDROCORTISONE (PERIANAL) 2.5 % EX CREA: 1.0000 "application " | TOPICAL_CREAM | Freq: Two times a day (BID) | CUTANEOUS | 1 refills | Status: AC

## 2020-07-06 ENCOUNTER — Emergency Department (HOSPITAL_BASED_OUTPATIENT_CLINIC_OR_DEPARTMENT_OTHER)
Admission: EM | Admit: 2020-07-06 | Discharge: 2020-07-06 | Disposition: A | Discharge status: Home / Self Care | Payer: Medicare Other | Attending: Emergency Medicine | Admitting: Emergency Medicine

## 2020-07-06 ENCOUNTER — Other Ambulatory Visit: Payer: Self-pay

## 2020-07-06 ENCOUNTER — Encounter (HOSPITAL_BASED_OUTPATIENT_CLINIC_OR_DEPARTMENT_OTHER): Payer: Self-pay

## 2020-07-06 ENCOUNTER — Emergency Department (HOSPITAL_BASED_OUTPATIENT_CLINIC_OR_DEPARTMENT_OTHER): Payer: Medicare Other

## 2020-07-06 DIAGNOSIS — I10 Essential (primary) hypertension: Secondary | ICD-10-CM | POA: Diagnosis not present

## 2020-07-06 DIAGNOSIS — K625 Hemorrhage of anus and rectum: Secondary | ICD-10-CM | POA: Diagnosis present

## 2020-07-06 DIAGNOSIS — Z21 Asymptomatic human immunodeficiency virus [HIV] infection status: Secondary | ICD-10-CM | POA: Insufficient documentation

## 2020-07-06 DIAGNOSIS — Z85828 Personal history of other malignant neoplasm of skin: Secondary | ICD-10-CM | POA: Insufficient documentation

## 2020-07-06 DIAGNOSIS — Z87891 Personal history of nicotine dependence: Secondary | ICD-10-CM | POA: Insufficient documentation

## 2020-07-06 DIAGNOSIS — R1030 Lower abdominal pain, unspecified: Secondary | ICD-10-CM | POA: Diagnosis not present

## 2020-07-06 LAB — COMPREHENSIVE METABOLIC PANEL
ALT: 16 U/L (ref 0–44)
AST: 24 U/L (ref 15–41)
Albumin: 3.8 g/dL (ref 3.5–5.0)
Alkaline Phosphatase: 46 U/L (ref 38–126)
Anion gap: 7 (ref 5–15)
BUN: 9 mg/dL (ref 8–23)
CO2: 31 mmol/L (ref 22–32)
Calcium: 9 mg/dL (ref 8.9–10.3)
Chloride: 93 mmol/L — ABNORMAL LOW (ref 98–111)
Creatinine, Ser: 1.06 mg/dL (ref 0.61–1.24)
GFR, Estimated: >60 mL/min (ref >60)
Glucose, Bld: 73 mg/dL (ref 70–99)
Potassium: 3.5 mmol/L (ref 3.5–5.1)
Sodium: 131 mmol/L — ABNORMAL LOW (ref 135–145)
Total Bilirubin: 0.8 mg/dL (ref 0.3–1.2)
Total Protein: 6 g/dL — ABNORMAL LOW (ref 6.5–8.1)

## 2020-07-06 LAB — CBC WITH DIFFERENTIAL/PLATELET
Abs Immature Granulocytes: 0 10*3/uL (ref 0.00–0.07)
Basophils Absolute: 0 10*3/uL (ref 0.0–0.1)
Basophils Relative: 1 %
Eosinophils Absolute: 0 10*3/uL (ref 0.0–0.5)
Eosinophils Relative: 1 %
HCT: 45.1 % (ref 39.0–52.0)
Hemoglobin: 16.1 g/dL (ref 13.0–17.0)
Immature Granulocytes: 0 %
Lymphocytes Relative: 24 %
Lymphs Abs: 1 10*3/uL (ref 0.7–4.0)
MCH: 32.7 pg (ref 26.0–34.0)
MCHC: 35.7 g/dL (ref 30.0–36.0)
MCV: 91.7 fL (ref 80.0–100.0)
Monocytes Absolute: 0.5 10*3/uL (ref 0.1–1.0)
Monocytes Relative: 13 %
Neutro Abs: 2.6 10*3/uL (ref 1.7–7.7)
Neutrophils Relative %: 61 %
Platelets: 141 10*3/uL — ABNORMAL LOW (ref 150–400)
RBC: 4.92 MIL/uL (ref 4.22–5.81)
RDW: 13 % (ref 11.5–15.5)
WBC: 4.1 10*3/uL (ref 4.0–10.5)
nRBC: 0 % (ref 0.0–0.2)

## 2020-07-06 LAB — PROTIME-INR
INR: 1 (ref 0.8–1.2)
Prothrombin Time: 12.6 seconds (ref 11.4–15.2)

## 2020-07-06 MED ORDER — IOHEXOL 300 MG/ML  SOLN
100.0000 mL | Freq: Once | INTRAMUSCULAR | Status: AC
  Administered 2020-07-06: 75 mL via INTRAVENOUS

## 2020-07-06 NOTE — Discharge Instructions (Signed)
You were seen in the emergency department today with abdominal discomfort and rectal bleeding.  Your CT scan was normal and your blood counts are within normal range.  Please keep your GI appointment on January 11.  Continue your supportive care measures at home along with constipation medication.  Return to the emergency department any worsening pain, fever, severe bleeding.

## 2020-07-06 NOTE — ED Provider Notes (Signed)
Emergency Department Provider Note   I have reviewed the triage vital signs and the nursing notes.   HISTORY  Chief Complaint Rectal Bleeding   HPI Troy Marsh is a 69 y.o. male with past medical history reviewed below presents emergency department valuation of rectal bleeding over the past week.  Patient reports episodic bright red blood mainly with having bowel movements.  He is having some lower abdominal, generalized discomfort which is moderate and often worse with bowel movements.  He is also having some relatively mild pain in the rectal area.  He is noticed bright red blood with wiping and with bowel movements.  No fevers or chills.  He is not anticoagulated.  He is called his GI doctor and has an appointment later in January.  He is not feeling lightheaded or weak.  No syncope.  He has been taking his steroid cream and suppositories for the past year with history of hemorrhoids and states that that does improve his symptoms slightly but given the bleeding he was seeing a wanted close evaluation in the ED.    Past Medical History:  Diagnosis Date  . Arthritis   . BPH (benign prostatic hyperplasia)    [facility]  DR. Elie Goody  . Cancer ([place])    Skin cancer- basal and squamous  . Constipation due to pain medication   . HIV disease ([place]) 1985   last CD4 10/02/12 was 277  . Hypertension   . Hypogonadism male     Patient Active Problem List   Diagnosis Date Noted  . Inguinal hernia 04/24/2018  . Otitis media 02/27/2018  . Radiculopathy 11/22/2017  . Allergic rhinitis 04/24/2017  . Elevated PSA 10/23/2016  . Acute sinus infection 08/08/2016  . Loss of weight 10/27/2015  . Erectile dysfunction 04/12/2015  . Hemorrhoids 09/28/2014  . Ex-smoker 04/02/2014  . Skin cancer 04/01/2014  . CA of skin 04/01/2014  . Well adult exam 03/30/2014  . HTN (hypertension) 04/28/2013  . Chronic lower back pain 02/10/2011  . Hypogonadism male 02/10/2011  . Low back pain  02/10/2011  . Cervical pain (neck) 06/20/2007  . HIV (human immunodeficiency virus infection) ([place]) 06/20/2007    Past Surgical History:  Procedure Laterality Date  . ANTERIOR CERVICAL DECOMP/DISCECTOMY FUSION Bilateral 11/22/2017   Procedure: ANTERIOR CERVICAL DECOMPRESSION FUSION CERVICAL 4-5, CERVICAL 5-6, CERVICAL 6-7 WITH INSTRUMENTATION AND ALLOGRAFT TIME      /   REQUESTED 4 HOURS;  Surgeon: Phylliss Bob, MD;  Location: [facility];  Service: Orthopedics;  Laterality: Bilateral;  . BACK SURGERY    . COLONOSCOPY W/ POLYPECTOMY    . EYE SURGERY Bilateral    Lasik, Cataract  . [place]  . INGUINAL HERNIA REPAIR Right 06/11/2018   Procedure: LAPAROSCOPIC RIGHT INGUINAL HERNIA WITH MESH;  Surgeon: Ralene Ok, MD;  Location: [facility];  Service: General;  Laterality: Right;  . INSERTION OF MESH Right 06/11/2018   Procedure: INSERTION OF MESH;  Surgeon: Ralene Ok, MD;  Location: [facility];  Service: General;  Laterality: Right;  . KNEE ARTHROSCOPY Right 2009  . KNEE ARTHROSCOPY Left 2011  . neck surgery  1984  . POSTERIOR CERVICAL FUSION/FORAMINOTOMY N/A 05/29/2018   Procedure: POSTERIOR CERVICAL DECOMPRESSION FUSION CERVICAL 4 - CERVICAL 7 WITH INSTRUMENTATION AND ALLOGRAFT;  Surgeon: Phylliss Bob, MD;  Location: [facility];  Service: Orthopedics;  Laterality: N/A;    Allergies Patient has no known allergies.  Family History  Problem Relation Age of Onset  . Coronary artery  disease Father   . Heart attack Father   . Hypertension Father   . Hyperlipidemia Father   . Cancer Father        prostate  . Colitis Maternal Grandmother   . Diabetes Neg Hx   . Colon cancer Neg Hx   . Rectal cancer Neg Hx   . Stomach cancer Neg Hx     Social History Social History   Tobacco Use  . Smoking status: Former Smoker    Packs/day: 2.00    Years: 35.00    Pack years: 70.00    Types: Cigarettes    Quit date: 02/10/2004    Years since quitting: 16.4  . Smokeless tobacco:  Never Used  Vaping Use  . Vaping Use: Never used  Substance Use Topics  . Alcohol use: No    Comment: quit 2004  . Drug use: No    Review of Systems  Constitutional: No fever/chills Eyes: No visual changes. ENT: No sore throat. Cardiovascular: Denies chest pain. Respiratory: Denies shortness of breath. Gastrointestinal: Positive abdominal pain.  No nausea, no vomiting.  No diarrhea.  No constipation. Positive rectal bleeding.  Genitourinary: Negative for dysuria. Musculoskeletal: Negative for back pain. Skin: Negative for rash. Neurological: Negative for headaches, focal weakness or numbness.  10-point ROS otherwise negative.  ____________________________________________   PHYSICAL EXAM:  VITAL SIGNS: ED Triage Vitals  Enc Vitals Group     BP 07/06/20 1252 121/77     Pulse Rate 07/06/20 1252 66     Resp 07/06/20 1252 18     Temp 07/06/20 1252 97.8 F (36.6 C)     Temp Source 07/06/20 1252 Oral     SpO2 07/06/20 1252 100 %     Weight 07/06/20 1252 141 lb (64 kg)     Height 07/06/20 1252 5\' 10"  (1.778 m)   Constitutional: Alert and oriented. Well appearing and in no acute distress. Eyes: Conjunctivae are normal.  Head: Atraumatic. Nose: No congestion/rhinnorhea. Mouth/Throat: Mucous membranes are moist.  Neck: No stridor.   Cardiovascular: Normal rate, regular rhythm. Good peripheral circulation. Grossly normal heart sounds.   Respiratory: Normal respiratory effort.  No retractions. Lungs CTAB. Gastrointestinal: Soft and nontender. No distention.  Visual rectal exam shows no external hemorrhoids.  No active bleeding or masses.  Musculoskeletal: No gross deformities of extremities. Neurologic:  Normal speech and language.  Skin:  Skin is warm, dry and intact. No rash noted.  ____________________________________________   LABS (all labs ordered are listed, but only abnormal results are displayed)  Labs Reviewed  COMPREHENSIVE METABOLIC PANEL - Abnormal;  Notable for the following components:      Result Value   Sodium 131 (*)    Chloride 93 (*)    Total Protein 6.0 (*)    All other components within normal limits  CBC WITH DIFFERENTIAL/PLATELET - Abnormal; Notable for the following components:   Platelets 141 (*)    All other components within normal limits  PROTIME-INR   ____________________________________________  RADIOLOGY  CT abdomen/pelvis w/o acute findings.  ____________________________________________   PROCEDURES  Procedure(s) performed:   Procedures  None  ____________________________________________   INITIAL IMPRESSION / ASSESSMENT AND PLAN / ED COURSE  Pertinent labs & imaging results that were available during my care of the patient were reviewed by me and considered in my medical decision making (see chart for details).   Patient presents to the emergency department with rectal bleeding and abdominal discomfort over the past week.  Has known history of hemorrhoids.  No visible external hemorrhoids on my exam.  Fairly minimal tenderness on abdominal exam.  Plan for screening blood work which results showing no anemia.  CT abdomen pelvis ordered to evaluate for possible diverticulitis/colitis/proctitis. Has GI follow up scheduled and is using sertoid suppositories already.   CT abdomen/pelvis reviewed with no acute findings. Patient has GI follow up in early January. He is HDS here with no active bleeding. Question internal hemorrhoids vs intermittent diverticular bleeding. Discussed strict ED return precautions. Will have the patient continue his steroid suppositories as directed by GI.  ____________________________________________  FINAL CLINICAL IMPRESSION(S) / ED DIAGNOSES  Final diagnoses:  Rectal bleeding  Lower abdominal pain     MEDICATIONS GIVEN DURING THIS VISIT:  Medications  iohexol (OMNIPAQUE) 300 MG/ML solution 100 mL (75 mLs Intravenous Contrast Given 07/06/20 1430)    Note:  This  document was prepared using Dragon voice recognition software and may include unintentional dictation errors.  Nanda Quinton, MD, [facility] Emergency Medicine    Brit Wernette, [facility], MD 07/08/20 [phone]

## 2020-07-06 NOTE — ED Notes (Signed)
Pt discharged to home. Discharge instructions have been discussed with patient and/or family members. Pt verbally acknowledges understanding d/c instructions, and endorses comprehension to checkout at registration before leaving.  °

## 2020-07-06 NOTE — ED Triage Notes (Signed)
Pt c/o rectal bleeding x 1 week-states may be r/t to "ruptured hemorrhoid"-NAD-NAD-steady gait

## 2020-07-27 ENCOUNTER — Ambulatory Visit (INDEPENDENT_AMBULATORY_CARE_PROVIDER_SITE_OTHER): Payer: Medicare Other | Admitting: Gastroenterology

## 2020-07-27 ENCOUNTER — Encounter: Payer: Self-pay | Admitting: Gastroenterology

## 2020-07-27 VITALS — BP 124/66 | HR 83 | Wt 146.5 lb

## 2020-07-27 DIAGNOSIS — K602 Anal fissure, unspecified: Secondary | ICD-10-CM | POA: Diagnosis not present

## 2020-07-27 DIAGNOSIS — K648 Other hemorrhoids: Secondary | ICD-10-CM | POA: Diagnosis not present

## 2020-07-27 MED ORDER — DILTIAZEM GEL 2 %: CUTANEOUS | 0 refills | Status: DC

## 2020-07-27 MED ORDER — HYDROCORTISONE (PERIANAL) 2.5 % EX CREA: TOPICAL_CREAM | CUTANEOUS | 0 refills | Status: DC

## 2020-07-27 NOTE — Progress Notes (Signed)
Chief Complaint: FU  Referring Provider:  Cassandria Anger, MD      ASSESSMENT AND PLAN;   #1. Anal fissure and internal hemorrhoids.  #2. H/O constipation (uses hydrocodone, Celebrex), neg colon 09/2018 except for fair quality of preparation.  Plan: - Minimize use of pain medications - HC 2.5% cream BID PR x 14 days - Diltiazem 2% gel bid PR x 2 weeks.(30g) - Continue Metamucil QD with 8 ounces of juice - Continue miralax 17g po qd. - Continue prunes.  - Recall colon 08/2023 - Call in 2 weeks. If still with problems, NTG cream.    HPI:    Troy Marsh is a 70 y.o. male  With HIV, HTN, OA S/P neck Sx ACD  and right inguinal hernia repair on chronic pain medications For follow-up visit Doing very well except for anal pain occasionally on defecation.  Had 1 episode of rectal bleeding July 06, 2020. He panicked and went to the emergency room. His hemoglobin was normal as below. He was reassured and given hydrocortisone.  No significant rectal bleeding since. Has occasional rectal bleeding especially after defecation.  Rectal exam showed healing posterior anal fissure, internal hemorrhoids.  No further constipation. Has been taking miralax at night and matamucil with juice in morning. Doesn't have to strain. Have been using sitz baths. Has minimized pain medications.  Does drink plenty of water.  Previous colonoscopy March 2020 as below.  Married to [employer] (patient of ours)  Previous GI procedures: Colonoscopy 09/2018 - Non-bleeding internal hemorrhoids. - Otherwise grossly normal colonoscopy. - Repeat in 5 years d/t HIV, quality of prep  Past Medical History:  Diagnosis Date  . Arthritis   . BPH (benign prostatic hyperplasia)    [facility]  DR. Elie Goody  . Cancer ([place])    Skin cancer- basal and squamous  . Constipation due to pain medication   . HIV disease ([place]) 1985   last CD4 10/02/12 was 277  . Hypertension   . Hypogonadism male      Past Surgical History:  Procedure Laterality Date  . ANTERIOR CERVICAL DECOMP/DISCECTOMY FUSION Bilateral 11/22/2017   Procedure: ANTERIOR CERVICAL DECOMPRESSION FUSION CERVICAL 4-5, CERVICAL 5-6, CERVICAL 6-7 WITH INSTRUMENTATION AND ALLOGRAFT TIME      /   REQUESTED 4 HOURS;  Surgeon: Phylliss Bob, MD;  Location: [facility];  Service: Orthopedics;  Laterality: Bilateral;  . BACK SURGERY    . COLONOSCOPY W/ POLYPECTOMY    . EYE SURGERY Bilateral    Lasik, Cataract  . [place]  . INGUINAL HERNIA REPAIR Right 06/11/2018   Procedure: LAPAROSCOPIC RIGHT INGUINAL HERNIA WITH MESH;  Surgeon: Ralene Ok, MD;  Location: [facility];  Service: General;  Laterality: Right;  . INSERTION OF MESH Right 06/11/2018   Procedure: INSERTION OF MESH;  Surgeon: Ralene Ok, MD;  Location: [facility];  Service: General;  Laterality: Right;  . KNEE ARTHROSCOPY Right 2009  . KNEE ARTHROSCOPY Left 2011  . neck surgery  1984  . POSTERIOR CERVICAL FUSION/FORAMINOTOMY N/A 05/29/2018   Procedure: POSTERIOR CERVICAL DECOMPRESSION FUSION CERVICAL 4 - CERVICAL 7 WITH INSTRUMENTATION AND ALLOGRAFT;  Surgeon: Phylliss Bob, MD;  Location: [facility];  Service: Orthopedics;  Laterality: N/A;    Family History  Problem Relation Age of Onset  . Coronary artery disease Father   . Heart attack Father   . Hypertension Father   . Hyperlipidemia Father   . Cancer Father        prostate  .  Colitis Maternal Grandmother   . Diabetes Neg Hx   . Colon cancer Neg Hx   . Rectal cancer Neg Hx   . Stomach cancer Neg Hx     Social History   Tobacco Use  . Smoking status: Former Smoker    Packs/day: 2.00    Years: 35.00    Pack years: 70.00    Types: Cigarettes    Quit date: 02/10/2004    Years since quitting: 16.4  . Smokeless tobacco: Never Used  Vaping Use  . Vaping Use: Never used  Substance Use Topics  . Alcohol use: No    Comment: quit 2004  . Drug use: No    Current Outpatient Medications   Medication Sig Dispense Refill  . aspirin 81 MG tablet Take 81 mg by mouth daily.    [name] atorvastatin (LIPITOR) 40 MG tablet Take 40 mg by mouth at bedtime.     . baclofen (LIORESAL) 20 MG tablet Take 1 tablet (20 mg total) by mouth 3 (three) times daily. 270 each 3  . BD DISP NEEDLES 21G X 1-1/2" MISC USE AS DIRECTED 100 each 0  . celecoxib (CELEBREX) 200 MG capsule TAKE 1 CAPSULE BY MOUTH  TWICE DAILY 180 capsule 3  . cetirizine-pseudoephedrine (ZYRTEC-D) 5-120 MG tablet Take 1 tablet by mouth daily as needed for allergies.     [name] EDEX 20 MCG injection INJECT (INTRACAVERNOUS)  20MCG AS NEEDED FOR  ERECTILE DYSFUNCTION. USE  NO MORE THAN 3 TIMES PER  WEEK 3 each 3  . emtricitabine-rilpivir-tenofovir AF (ODEFSEY) 200-25-25 MG TABS tablet Take 1 tablet by mouth daily.    [name] gabapentin (NEURONTIN) 100 MG capsule TAKE 1 CAPSULE BY MOUTH 3  TIMES DAILY 270 capsule 3  . hydrochlorothiazide (HYDRODIURIL) 50 MG tablet TAKE 1 TABLET BY MOUTH  DAILY 90 tablet 3  . HYDROcodone-acetaminophen (NORCO) 7.5-325 MG tablet Take 1 tablet by mouth every 6 (six) hours as needed for severe pain. 120 tablet 0  . hydrocortisone (ANUCORT-HC) 25 MG suppository UNWRAP AND INSERT 1  SUPPOSITORY RECTALLY TWICE  DAILY (Patient taking differently: Place 25 mg rectally daily as needed. UNWRAP AND INSERT 1  SUPPOSITORY RECTALLY TWICE  DAILY) 90 suppository 3  . hydrocortisone (ANUSOL-HC) 2.5 % rectal cream PLACE 1 APPLICATION RECTALLY TWO TIMES DAILY FOR 10 DAYS (Patient taking differently: Place 1 application rectally 2 (two) times daily as needed.) 30 g 0  . linaclotide (LINZESS) 72 MCG capsule TAKE 1 CAPSULE BY MOUTH  DAILY BEFORE BREAKFAST 90 capsule 3  . Melatonin 10 MG TABS Take 10 mg by mouth at bedtime as needed (for sleep).     . mirabegron ER (MYRBETRIQ) 25 MG TB24 tablet Take 25 mg by mouth as needed (3 tiems a week).    . montelukast (SINGULAIR) 10 MG tablet TAKE 1 TABLET BY MOUTH  DAILY 90 tablet 3  . Multiple Vitamin  (MULTIVITAMIN WITH MINERALS) TABS tablet Take 1 tablet by mouth daily. COMPLETE MULTIVITAMIN MEN'S 50+    . polyethylene glycol powder (GLYCOLAX/MIRALAX) powder Take 17 g by mouth daily as needed for moderate constipation.     . potassium chloride (K-DUR) 10 MEQ tablet TAKE 1 TABLET BY MOUTH  DAILY 90 tablet 3  . RESTASIS 0.05 % ophthalmic emulsion Place 1 drop into both eyes 2 (two) times daily.    . tadalafil (CIALIS) 5 MG tablet TAKE 1 TABLET EVERY DAY 90 tablet 1  . testosterone enanthate (DELATESTRYL) 200 MG/ML injection For IM use only (Patient taking  differently: Inject 200 mg into the muscle every 14 (fourteen) days. For IM use only) 5 mL 3  . methocarbamol (ROBAXIN) 500 MG tablet Take 1 tablet by mouth as needed.     No current facility-administered medications for this visit.    No Known Allergies  Review of Systems:  Negative except for HPI.    Physical Exam:    BP 124/66   Pulse 83   Wt 146 lb 8 oz (66.5 kg)   BMI 21.02 kg/m  Filed Weights   07/27/20 1526  Weight: 146 lb 8 oz (66.5 kg)   Constitutional:  Well-developed, in no acute distress. Psychiatric: Normal mood and affect. Behavior is normal. HEENT: Pupils normal.  Conjunctivae are normal. No scleral icterus. Cardiovascular: Normal rate, regular rhythm. No edema Pulmonary/chest: Effort normal and breath sounds normal. No wheezing, rales or rhonchi. Abdominal: Soft, nondistended. Nontender. Bowel sounds active throughout. There are no masses palpable. No hepatomegaly. Rectal: Previously -Small healing posterior anal fissure, small internal hemorrhoids, stool brown heme-negative.  Examined in presence of Trisha. Neurological: Alert and oriented to person place and time. Skin: Skin is warm and dry. No rashes noted.  Data Reviewed: I have personally reviewed following labs and imaging studies  CBC: CBC Latest Ref Rng & Units 07/06/2020 05/13/2020 06/03/2018  WBC 4.0 - 10.5 K/uL 4.1 4.2 5.4  Hemoglobin 13.0 -  17.0 g/dL 16.1 16.7 14.5  Hematocrit 39.0 - 52.0 % 45.1 48.0 42.6  Platelets 150 - 400 K/uL 141(L) 151.0 141(L)    CMP: CMP Latest Ref Rng & Units 07/06/2020 05/13/2020 05/13/2020  Glucose 70 - 99 mg/dL 73 63(L) -  BUN 8 - 23 mg/dL 9 11 -  Creatinine 0.61 - 1.24 mg/dL 1.06 1.21 -  Sodium 135 - 145 mmol/L 131(L) 137 -  Potassium 3.5 - 5.1 mmol/L 3.5 4.0 -  Chloride 98 - 111 mmol/L 93(L) 96 -  CO2 22 - 32 mmol/L 31 34(H) -  Calcium 8.9 - 10.3 mg/dL 9.0 9.1 -  Total Protein 6.5 - 8.1 g/dL 6.0(L) 5.9(L) 5.9(L)  Total Bilirubin 0.3 - 1.2 mg/dL 0.8 1.0 1.0  Alkaline Phos 38 - 126 U/L 46 52 52  AST 15 - 41 U/L 24 23 23   ALT 0 - 44 U/L 16 17 17   I spent 25 minutes of face-to-face time with the patient. Greater than 50% of the time was spent counseling and coordinating care.     Carmell Austria, MD 07/27/2020, 3:40 PM  Cc: Plotnikov, Evie Lacks, MD

## 2020-07-27 NOTE — Patient Instructions (Signed)
If you are age 71 or older, your body mass index should be between 23-30. Your Body mass index is 21.02 kg/m. If this is out of the aforementioned range listed, please consider follow up with your Primary Care Provider.  If you are age 63 or younger, your body mass index should be between 19-25. Your Body mass index is 21.02 kg/m. If this is out of the aformentioned range listed, please consider follow up with your Primary Care Provider.    [facility]'s information is below: Address: [address] [place], [place] [address]  [phone]  *Please DO NOT go directly from our office to pick up this medication! Give the pharmacy 1 day to process the pres prescription as this is compounded and takes time to make.  HC 2.5 cream twice daily for 14 days Diltiazem cream for 14 days   Minimize use of pain medications Continue prunes Call in 2 weeks to update [place] if you are still having problems.  Due to recent changes in healthcare laws, you may see the results of your imaging and laboratory studies on MyChart before your provider has had a chance to review them.  We understand that in some cases there may be results that are confusing or concerning to you. Not all laboratory results come back in the same time frame and the provider may be waiting for multiple results in order to interpret others.  Please give [place] 48 hours in order for your provider to thoroughly review all the results before contacting the office for clarification of your results.

## 2020-08-16 ENCOUNTER — Encounter: Payer: Self-pay | Admitting: Internal Medicine

## 2020-08-16 ENCOUNTER — Ambulatory Visit (INDEPENDENT_AMBULATORY_CARE_PROVIDER_SITE_OTHER): Payer: Medicare Other | Admitting: Internal Medicine

## 2020-08-16 ENCOUNTER — Other Ambulatory Visit: Payer: Self-pay

## 2020-08-16 DIAGNOSIS — I10 Essential (primary) hypertension: Secondary | ICD-10-CM | POA: Diagnosis not present

## 2020-08-16 DIAGNOSIS — M545 Low back pain, unspecified: Secondary | ICD-10-CM | POA: Diagnosis not present

## 2020-08-16 DIAGNOSIS — G8929 Other chronic pain: Secondary | ICD-10-CM

## 2020-08-16 DIAGNOSIS — E291 Testicular hypofunction: Secondary | ICD-10-CM | POA: Diagnosis not present

## 2020-08-16 DIAGNOSIS — N528 Other male erectile dysfunction: Secondary | ICD-10-CM

## 2020-08-16 MED ORDER — HYDROCODONE-ACETAMINOPHEN 7.5-325 MG PO TABS: 1.0000 | ORAL_TABLET | Freq: Four times a day (QID) | ORAL | 0 refills | Status: DC | PRN

## 2020-08-16 NOTE — Progress Notes (Signed)
Subjective:  Patient ID: Troy Marsh, male    DOB: [birth year]  Age: 69 y.o. MRN: [id number]  CC: Follow-up (3 month f/u)   HPI Troy Marsh presents for HTN, hypogonadism, ED f/u  Outpatient Medications Prior to Visit  Medication Sig Dispense Refill  . aspirin 81 MG tablet Take 81 mg by mouth daily.    . baclofen (LIORESAL) 20 MG tablet Take 1 tablet (20 mg total) by mouth 3 (three) times daily. 270 each 3  . BD DISP NEEDLES 21G X 1-1/2" MISC USE AS DIRECTED 100 each 0  . celecoxib (CELEBREX) 200 MG capsule TAKE 1 CAPSULE BY MOUTH  TWICE DAILY 180 capsule 3  . cetirizine-pseudoephedrine (ZYRTEC-D) 5-120 MG tablet Take 1 tablet by mouth daily as needed for allergies.     [name] diltiazem 2 % GEL Diltiazem gel 2% apply rectally twice daily for 2 weeks 30 g 0  . EDEX 20 MCG injection INJECT (INTRACAVERNOUS)  20MCG AS NEEDED FOR  ERECTILE DYSFUNCTION. USE  NO MORE THAN 3 TIMES PER  WEEK 3 each 3  . emtricitabine-rilpivir-tenofovir AF (ODEFSEY) 200-25-25 MG TABS tablet Take 1 tablet by mouth daily.    [name] gabapentin (NEURONTIN) 100 MG capsule TAKE 1 CAPSULE BY MOUTH 3  TIMES DAILY 270 capsule 3  . hydrochlorothiazide (HYDRODIURIL) 50 MG tablet TAKE 1 TABLET BY MOUTH  DAILY 90 tablet 3  . HYDROcodone-acetaminophen (NORCO) 7.5-325 MG tablet Take 1 tablet by mouth every 6 (six) hours as needed for severe pain. 120 tablet 0  . hydrocortisone (ANUSOL-HC) 2.5 % rectal cream PLACE 1 APPLICATION RECTALLY TWO TIMES DAILY FOR 10 DAYS (Patient taking differently: Place 1 application rectally 2 (two) times daily as needed.) 30 g 0  . hydrocortisone (ANUSOL-HC) 2.5 % rectal cream Apply rectally twice daily for 14 days 30 g 0  . linaclotide (LINZESS) 72 MCG capsule TAKE 1 CAPSULE BY MOUTH  DAILY BEFORE BREAKFAST 90 capsule 3  . Melatonin 10 MG TABS Take 10 mg by mouth at bedtime as needed (for sleep).     . methocarbamol (ROBAXIN) 500 MG tablet Take 1 tablet by mouth as needed.    . mirabegron ER  (MYRBETRIQ) 25 MG TB24 tablet Take 25 mg by mouth as needed (3 tiems a week).    . montelukast (SINGULAIR) 10 MG tablet TAKE 1 TABLET BY MOUTH  DAILY 90 tablet 3  . Multiple Vitamin (MULTIVITAMIN WITH MINERALS) TABS tablet Take 1 tablet by mouth daily. COMPLETE MULTIVITAMIN MEN'S 50+    . polyethylene glycol powder (GLYCOLAX/MIRALAX) powder Take 17 g by mouth daily as needed for moderate constipation.     . potassium chloride (K-DUR) 10 MEQ tablet TAKE 1 TABLET BY MOUTH  DAILY 90 tablet 3  . RESTASIS 0.05 % ophthalmic emulsion Place 1 drop into both eyes 2 (two) times daily.    . tadalafil (CIALIS) 5 MG tablet TAKE 1 TABLET EVERY DAY 90 tablet 1  . testosterone enanthate (DELATESTRYL) 200 MG/ML injection For IM use only (Patient taking differently: Inject 200 mg into the muscle every 14 (fourteen) days. For IM use only) 5 mL 3  . atorvastatin (LIPITOR) 40 MG tablet Take 40 mg by mouth at bedtime.      No facility-administered medications prior to visit.    ROS: Review of Systems  Constitutional: Negative for appetite change, fatigue and unexpected weight change.  HENT: Negative for congestion, nosebleeds, sneezing, sore throat and trouble swallowing.   Eyes: Negative for itching and visual disturbance.  Respiratory: Negative for cough.   Cardiovascular: Negative for chest pain, palpitations and leg swelling.  Gastrointestinal: Negative for abdominal distention, blood in stool, diarrhea and nausea.  Genitourinary: Negative for frequency and hematuria.  Musculoskeletal: Positive for back pain. Negative for gait problem, joint swelling and neck pain.  Skin: Negative for rash.  Neurological: Negative for dizziness, tremors, speech difficulty and weakness.  Psychiatric/Behavioral: Negative for agitation, dysphoric mood and sleep disturbance. The patient is not nervous/anxious.     Objective:  BP (!) 152/80 (BP Location: Left Arm)   Pulse (!) 51   Temp 97.7 F (36.5 C) (Oral)   Ht 5\' 10"   (1.778 m)   Wt 144 lb (65.3 kg)   SpO2 98%   BMI 20.66 kg/m   BP Readings from Last 3 Encounters:  08/16/20 (!) 152/80  07/27/20 124/66  07/06/20 (!) 142/80    Wt Readings from Last 3 Encounters:  08/16/20 144 lb (65.3 kg)  07/27/20 146 lb 8 oz (66.5 kg)  07/06/20 141 lb (64 kg)    Physical Exam Constitutional:      General: He is not in acute distress.    Appearance: He is well-developed.     Comments: NAD  HENT:     Mouth/Throat:     Mouth: Oropharynx is clear and moist.  Eyes:     Conjunctiva/sclera: Conjunctivae normal.     Pupils: Pupils are equal, round, and reactive to light.  Neck:     Thyroid: No thyromegaly.     Vascular: No JVD.  Cardiovascular:     Rate and Rhythm: Normal rate and regular rhythm.     Pulses: Intact distal pulses.     Heart sounds: Normal heart sounds. No murmur heard. No friction rub. No gallop.   Pulmonary:     Effort: Pulmonary effort is normal. No respiratory distress.     Breath sounds: Normal breath sounds. No wheezing or rales.  Chest:     Chest wall: No tenderness.  Abdominal:     General: Bowel sounds are normal. There is no distension.     Palpations: Abdomen is soft. There is no mass.     Tenderness: There is no abdominal tenderness. There is no guarding or rebound.  Musculoskeletal:        General: Tenderness present. No edema. Normal range of motion.     Cervical back: Normal range of motion.  Lymphadenopathy:     Cervical: No cervical adenopathy.  Skin:    General: Skin is warm and dry.     Findings: No rash.  Neurological:     Mental Status: He is alert and oriented to person, place, and time.     Cranial Nerves: No cranial nerve deficit.     Motor: No abnormal muscle tone.     Coordination: He displays a negative Romberg sign. Coordination normal.     Gait: Gait normal.     Deep Tendon Reflexes: Reflexes are normal and symmetric.  Psychiatric:        Mood and Affect: Mood and affect normal.        Behavior:  Behavior normal.        Thought Content: Thought content normal.        Judgment: Judgment normal.   LS tender w/ROM  Lab Results  Component Value Date   WBC 4.1 07/06/2020   HGB 16.1 07/06/2020   HCT 45.1 07/06/2020   PLT 141 (L) 07/06/2020   GLUCOSE 73 07/06/2020   CHOL 131 05/13/2020  TRIG 52.0 05/13/2020   HDL 71.90 05/13/2020   LDLDIRECT 113.3 05/21/2006   LDLCALC 49 05/13/2020   ALT 16 07/06/2020   AST 24 07/06/2020   NA 131 (L) 07/06/2020   K 3.5 07/06/2020   CL 93 (L) 07/06/2020   CREATININE 1.06 07/06/2020   BUN 9 07/06/2020   CO2 31 07/06/2020   TSH 2.14 05/13/2020   PSA 1.18 05/13/2020   INR 1.0 07/06/2020    CT ABDOMEN PELVIS W CONTRAST  Result Date: 07/06/2020 CLINICAL DATA:  Rectal bleeding for 1 week. EXAM: CT ABDOMEN AND PELVIS WITH CONTRAST TECHNIQUE: Multidetector CT imaging of the abdomen and pelvis was performed using the standard protocol following bolus administration of intravenous contrast. CONTRAST:  75 mL OMNIPAQUE IOHEXOL 300 MG/ML  SOLN COMPARISON:  None. FINDINGS: Lower chest: Lung bases are clear. No pleural or pericardial effusion. Hepatobiliary: 0.4 cm hyperattenuating focus in the posterior right hepatic lobe is likely a flash filling hemangioma. The liver is otherwise negative. Gallbladder and biliary tree appear normal. Pancreas: Unremarkable. No pancreatic ductal dilatation or surrounding inflammatory changes. Spleen: Normal in size without focal abnormality. Adrenals/Urinary Tract: Adrenal glands are unremarkable. Kidneys are normal, without renal calculi, solid lesion, or hydronephrosis. Small left renal cyst incidentally noted. Bladder is unremarkable. Stomach/Bowel: Stomach is within normal limits. Appendix appears normal. No evidence of bowel wall thickening, distention, or inflammatory changes. Vascular/Lymphatic: Aortic atherosclerosis. No enlarged abdominal or pelvic lymph nodes. Reproductive: Prostatomegaly. Other: Surgical clips in the  pelvis noted. Musculoskeletal: No fracture or worrisome lesion. Schmorl's nodes at L3-4 and lower lumbar spondylosis noted. IMPRESSION: No acute abnormality.  No finding to explain rectal bleeding. Prostatomegaly. Aortic Atherosclerosis (ICD10-I70.0). Electronically Signed   By: Inge Rise M.D.   On: 07/06/2020 14:49    Assessment & Plan:    [name], MD

## 2020-08-16 NOTE — Assessment & Plan Note (Signed)
On Cialis and Caverjects prn 

## 2020-08-16 NOTE — Assessment & Plan Note (Signed)
NAS diet ?HCTZ ?Kdur ?

## 2020-08-16 NOTE — Assessment & Plan Note (Signed)
Testosterone replacment q 2 weeks IM and using caverject ? Potential benefits of a long term testosterone use as well as potential risks  and complications were explained to the patient and were aknowledged. ?Testosterone levels are low however it is working clinically - no dose change ?

## 2020-08-16 NOTE — Assessment & Plan Note (Signed)
Norco prn  Potential benefits of a long term opioids use as well as potential risks (i.e. addiction risk, apnea etc) and complications (i.e. Somnolence, constipation and others) were explained to the patient and were aknowledged. 

## 2020-08-17 ENCOUNTER — Telehealth: Payer: Self-pay | Admitting: Internal Medicine

## 2020-08-17 NOTE — Telephone Encounter (Signed)
Patient states that the pharmacy needs an prior auth for the HYDROcodone-acetaminophen ([place]) 7.5-325 MG tablet because without it they can only send in a 7 day supply

## 2020-08-20 NOTE — Telephone Encounter (Signed)
Completed PA via cover-my-meds w/ Key: [id number]. Rec'd msg stating " [facility] is reviewing your PA request. Typically an electronic response will be received within 72 hours.".Marland KitchenJohny Marsh

## 2020-08-25 NOTE — Telephone Encounter (Signed)
Rec'd PA med was denied it states does not meet criteria.Marland KitchenJohny Marsh

## 2020-09-20 ENCOUNTER — Other Ambulatory Visit: Payer: Self-pay | Admitting: *Deleted

## 2020-09-20 MED ORDER — TADALAFIL 5 MG PO TABS: ORAL_TABLET | ORAL | 3 refills | Status: DC

## 2020-09-20 NOTE — Telephone Encounter (Signed)
MD received fsx from [place] [facility] renewal for pt Tadalafil 5 mg. MD approve w/ #90 and  3 refills. Faxed back to [phone].Marland KitchenJohny Chess

## 2020-09-28 ENCOUNTER — Other Ambulatory Visit: Payer: Self-pay | Admitting: Internal Medicine

## 2020-10-12 ENCOUNTER — Other Ambulatory Visit: Payer: Self-pay | Admitting: *Deleted

## 2020-10-13 MED ORDER — TESTOSTERONE ENANTHATE 200 MG/ML IM SOLN: 200.0000 mg | INTRAMUSCULAR | 5 refills | Status: DC

## 2020-11-11 ENCOUNTER — Telehealth: Payer: Self-pay | Admitting: Internal Medicine

## 2020-11-11 NOTE — Telephone Encounter (Signed)
  testosterone enanthate (DELATESTRYL) 200 MG/ML injection Pharmacy calling they need the prescription updated from 5 refills to 4 because it is dispensed out of [place] and it is a controlled substance there and cant be over 120 days    Call [phone]

## 2020-11-12 ENCOUNTER — Other Ambulatory Visit: Payer: Self-pay

## 2020-11-15 ENCOUNTER — Ambulatory Visit (INDEPENDENT_AMBULATORY_CARE_PROVIDER_SITE_OTHER): Payer: Medicare Other | Admitting: Internal Medicine

## 2020-11-15 ENCOUNTER — Encounter: Payer: Self-pay | Admitting: Internal Medicine

## 2020-11-15 ENCOUNTER — Other Ambulatory Visit: Payer: Self-pay

## 2020-11-15 DIAGNOSIS — I10 Essential (primary) hypertension: Secondary | ICD-10-CM | POA: Diagnosis not present

## 2020-11-15 DIAGNOSIS — E291 Testicular hypofunction: Secondary | ICD-10-CM

## 2020-11-15 DIAGNOSIS — D751 Secondary polycythemia: Secondary | ICD-10-CM | POA: Insufficient documentation

## 2020-11-15 DIAGNOSIS — Z21 Asymptomatic human immunodeficiency virus [HIV] infection status: Secondary | ICD-10-CM | POA: Diagnosis not present

## 2020-11-15 DIAGNOSIS — C4490 Unspecified malignant neoplasm of skin, unspecified: Secondary | ICD-10-CM | POA: Diagnosis not present

## 2020-11-15 DIAGNOSIS — I700 Atherosclerosis of aorta: Secondary | ICD-10-CM

## 2020-11-15 NOTE — Assessment & Plan Note (Signed)
Dermatology checks q 3 months

## 2020-11-15 NOTE — Patient Instructions (Signed)
Shibumi shade

## 2020-11-15 NOTE — Assessment & Plan Note (Signed)
Doing well 

## 2020-11-15 NOTE — Assessment & Plan Note (Addendum)
Testosterone replacment q 2 weeks IM and using caverject  Polycythemia. Discussed phlebotomies - Hematology ref

## 2020-11-15 NOTE — Progress Notes (Signed)
Subjective:  Patient ID: Troy Marsh, male    DOB: 04-17-1951  Age: 70 y.o. MRN: [id number]  CC: Follow-up (3 MONTH F/U)   HPI Troy Marsh presents for hypogonadism, dyslipidemia, allergies f/u Labs from April reviewed  Outpatient Medications Prior to Visit  Medication Sig Dispense Refill  . aspirin 81 MG tablet Take 81 mg by mouth daily.    . baclofen (LIORESAL) 20 MG tablet Take 1 tablet (20 mg total) by mouth 3 (three) times daily. 270 each 3  . BD DISP NEEDLES 18G X 1-1/2" MISC USE AS DIRECTED 6 each 3  . BD DISP NEEDLES 21G X 1-1/2" MISC USE AS DIRECTED 100 each 0  . celecoxib (CELEBREX) 200 MG capsule TAKE 1 CAPSULE BY MOUTH  TWICE DAILY 180 capsule 3  . cetirizine-pseudoephedrine (ZYRTEC-D) 5-120 MG tablet Take 1 tablet by mouth daily as needed for allergies.     [name] diltiazem 2 % GEL Diltiazem gel 2% apply rectally twice daily for 2 weeks 30 g 0  . EDEX 20 MCG injection INJECT (INTRACAVERNOUS)  20MCG AS NEEDED FOR  ERECTILE DYSFUNCTION. USE  NO MORE THAN 3 TIMES PER  WEEK 3 each 3  . emtricitabine-rilpivir-tenofovir AF (ODEFSEY) 200-25-25 MG TABS tablet Take 1 tablet by mouth daily.    [name] gabapentin (NEURONTIN) 100 MG capsule TAKE 1 CAPSULE BY MOUTH 3  TIMES DAILY 270 capsule 3  . hydrochlorothiazide (HYDRODIURIL) 50 MG tablet TAKE 1 TABLET BY MOUTH  DAILY 90 tablet 3  . HYDROcodone-acetaminophen (NORCO) 7.5-325 MG tablet Take 1 tablet by mouth every 6 (six) hours as needed for severe pain. 120 tablet 0  . HYDROcodone-acetaminophen (NORCO) 7.5-325 MG tablet Take 1 tablet by mouth every 6 (six) hours as needed for severe pain. 120 tablet 0  . HYDROcodone-acetaminophen (NORCO) 7.5-325 MG tablet Take 1 tablet by mouth every 6 (six) hours as needed for severe pain. 120 tablet 0  . linaclotide (LINZESS) 72 MCG capsule TAKE 1 CAPSULE BY MOUTH  DAILY BEFORE BREAKFAST 90 capsule 3  . Melatonin 10 MG TABS Take 10 mg by mouth at bedtime as needed (for sleep).     . methocarbamol  (ROBAXIN) 500 MG tablet Take 1 tablet by mouth as needed.    . mirabegron ER (MYRBETRIQ) 25 MG TB24 tablet Take 25 mg by mouth as needed (2 TIMES A WEEK).    . montelukast (SINGULAIR) 10 MG tablet TAKE 1 TABLET BY MOUTH  DAILY 90 tablet 3  . Multiple Vitamin (MULTIVITAMIN WITH MINERALS) TABS tablet Take 1 tablet by mouth daily. COMPLETE MULTIVITAMIN MEN'S 50+    . polyethylene glycol powder (GLYCOLAX/MIRALAX) powder Take 17 g by mouth daily as needed for moderate constipation.     . potassium chloride (K-DUR) 10 MEQ tablet TAKE 1 TABLET BY MOUTH  DAILY 90 tablet 3  . RESTASIS 0.05 % ophthalmic emulsion Place 1 drop into both eyes 2 (two) times daily.    . tadalafil (CIALIS) 5 MG tablet TAKE 1 TABLET EVERY DAY 90 tablet 3  . testosterone enanthate (DELATESTRYL) 200 MG/ML injection Inject 1 mL (200 mg total) into the muscle every 14 (fourteen) days. For IM use only 5 mL 5  . atorvastatin (LIPITOR) 40 MG tablet Take 40 mg by mouth at bedtime.     . hydrocortisone (ANUSOL-HC) 2.5 % rectal cream PLACE 1 APPLICATION RECTALLY TWO TIMES DAILY FOR 10 DAYS (Patient taking differently: Place 1 application rectally 2 (two) times daily as needed.) 30 g 0  .  hydrocortisone (ANUSOL-HC) 2.5 % rectal cream Apply rectally twice daily for 14 days 30 g 0   No facility-administered medications prior to visit.    ROS: Review of Systems  Constitutional: Negative for appetite change, fatigue and unexpected weight change.  HENT: Negative for congestion, nosebleeds, sneezing, sore throat and trouble swallowing.   Eyes: Negative for itching and visual disturbance.  Respiratory: Negative for cough.   Cardiovascular: Negative for chest pain, palpitations and leg swelling.  Gastrointestinal: Negative for abdominal distention, blood in stool, diarrhea and nausea.  Genitourinary: Negative for frequency and hematuria.  Musculoskeletal: Negative for back pain, gait problem, joint swelling and neck pain.  Skin: Negative for  rash.  Neurological: Negative for dizziness, tremors, speech difficulty and weakness.  Psychiatric/Behavioral: Negative for agitation, dysphoric mood and sleep disturbance. The patient is not nervous/anxious.     Objective:  BP 130/72 (BP Location: Left Arm)   Pulse 67   Temp 98.1 F (36.7 C) (Oral)   Ht 5\' 10"  (1.778 m)   Wt 139 lb 6.4 oz (63.2 kg)   SpO2 97%   BMI 20.00 kg/m   BP Readings from Last 3 Encounters:  11/15/20 130/72  08/16/20 (!) 152/80  07/27/20 124/66    Wt Readings from Last 3 Encounters:  11/15/20 139 lb 6.4 oz (63.2 kg)  08/16/20 144 lb (65.3 kg)  07/27/20 146 lb 8 oz (66.5 kg)    Physical Exam Constitutional:      General: He is not in acute distress.    Appearance: He is well-developed.     Comments: NAD  Eyes:     Conjunctiva/sclera: Conjunctivae normal.     Pupils: Pupils are equal, round, and reactive to light.  Neck:     Thyroid: No thyromegaly.     Vascular: No JVD.  Cardiovascular:     Rate and Rhythm: Normal rate and regular rhythm.     Heart sounds: Normal heart sounds. No murmur heard. No friction rub. No gallop.   Pulmonary:     Effort: Pulmonary effort is normal. No respiratory distress.     Breath sounds: Normal breath sounds. No wheezing or rales.  Chest:     Chest wall: No tenderness.  Abdominal:     General: Bowel sounds are normal. There is no distension.     Palpations: Abdomen is soft. There is no mass.     Tenderness: There is no abdominal tenderness. There is no guarding or rebound.  Musculoskeletal:        General: No tenderness. Normal range of motion.     Cervical back: Normal range of motion.  Lymphadenopathy:     Cervical: No cervical adenopathy.  Skin:    General: Skin is warm and dry.     Findings: No rash.  Neurological:     Mental Status: He is alert and oriented to person, place, and time.     Cranial Nerves: No cranial nerve deficit.     Motor: No abnormal muscle tone.     Coordination: Coordination  normal.     Gait: Gait normal.     Deep Tendon Reflexes: Reflexes are normal and symmetric.  Psychiatric:        Behavior: Behavior normal.        Thought Content: Thought content normal.        Judgment: Judgment normal.   bruising  Lab Results  Component Value Date   WBC 4.1 07/06/2020   HGB 16.1 07/06/2020   HCT 45.1 07/06/2020  PLT 141 (L) 07/06/2020   GLUCOSE 73 07/06/2020   CHOL 131 05/13/2020   TRIG 52.0 05/13/2020   HDL 71.90 05/13/2020   LDLDIRECT 113.3 05/21/2006   LDLCALC 49 05/13/2020   ALT 16 07/06/2020   AST 24 07/06/2020   NA 131 (L) 07/06/2020   K 3.5 07/06/2020   CL 93 (L) 07/06/2020   CREATININE 1.06 07/06/2020   BUN 9 07/06/2020   CO2 31 07/06/2020   TSH 2.14 05/13/2020   PSA 1.18 05/13/2020   INR 1.0 07/06/2020    CT ABDOMEN PELVIS W CONTRAST  Result Date: 07/06/2020 CLINICAL DATA:  Rectal bleeding for 1 week. EXAM: CT ABDOMEN AND PELVIS WITH CONTRAST TECHNIQUE: Multidetector CT imaging of the abdomen and pelvis was performed using the standard protocol following bolus administration of intravenous contrast. CONTRAST:  75 mL OMNIPAQUE IOHEXOL 300 MG/ML  SOLN COMPARISON:  None. FINDINGS: Lower chest: Lung bases are clear. No pleural or pericardial effusion. Hepatobiliary: 0.4 cm hyperattenuating focus in the posterior right hepatic lobe is likely a flash filling hemangioma. The liver is otherwise negative. Gallbladder and biliary tree appear normal. Pancreas: Unremarkable. No pancreatic ductal dilatation or surrounding inflammatory changes. Spleen: Normal in size without focal abnormality. Adrenals/Urinary Tract: Adrenal glands are unremarkable. Kidneys are normal, without renal calculi, solid lesion, or hydronephrosis. Small left renal cyst incidentally noted. Bladder is unremarkable. Stomach/Bowel: Stomach is within normal limits. Appendix appears normal. No evidence of bowel wall thickening, distention, or inflammatory changes. Vascular/Lymphatic: Aortic  atherosclerosis. No enlarged abdominal or pelvic lymph nodes. Reproductive: Prostatomegaly. Other: Surgical clips in the pelvis noted. Musculoskeletal: No fracture or worrisome lesion. Schmorl's nodes at L3-4 and lower lumbar spondylosis noted. IMPRESSION: No acute abnormality.  No finding to explain rectal bleeding. Prostatomegaly. Aortic Atherosclerosis (ICD10-I70.0). Electronically Signed   By: Inge Rise M.D.   On: 07/06/2020 14:49    Assessment & Plan:    [name], MD

## 2020-11-15 NOTE — Assessment & Plan Note (Addendum)
On testosterone. Discussed phlebotomies - Hematology ref

## 2020-11-15 NOTE — Assessment & Plan Note (Signed)
HCTZ Kdur 

## 2020-11-16 MED ORDER — TESTOSTERONE ENANTHATE 200 MG/ML IM SOLN: 200.0000 mg | INTRAMUSCULAR | 4 refills | Status: DC

## 2020-11-16 NOTE — Telephone Encounter (Signed)
Ok w/me Thx 

## 2020-11-28 DIAGNOSIS — I700 Atherosclerosis of aorta: Secondary | ICD-10-CM | POA: Insufficient documentation

## 2020-11-28 NOTE — Assessment & Plan Note (Signed)
On Lipitor 

## 2020-11-29 ENCOUNTER — Other Ambulatory Visit: Payer: Self-pay | Admitting: *Deleted

## 2020-11-29 DIAGNOSIS — D45 Polycythemia vera: Secondary | ICD-10-CM

## 2020-11-30 ENCOUNTER — Other Ambulatory Visit: Payer: Self-pay

## 2020-11-30 ENCOUNTER — Inpatient Hospital Stay: Payer: Medicare Other | Attending: Nurse Practitioner

## 2020-11-30 DIAGNOSIS — D45 Polycythemia vera: Secondary | ICD-10-CM | POA: Insufficient documentation

## 2020-11-30 LAB — CBC WITH DIFFERENTIAL (CANCER CENTER ONLY)
Abs Immature Granulocytes: 0.02 10*3/uL (ref 0.00–0.07)
Basophils Absolute: 0 10*3/uL (ref 0.0–0.1)
Basophils Relative: 1 %
Eosinophils Absolute: 0.1 10*3/uL (ref 0.0–0.5)
Eosinophils Relative: 2 %
HCT: 47.1 % (ref 39.0–52.0)
Hemoglobin: 16.6 g/dL (ref 13.0–17.0)
Immature Granulocytes: 0 %
Lymphocytes Relative: 25 %
Lymphs Abs: 1.5 10*3/uL (ref 0.7–4.0)
MCH: 32.7 pg (ref 26.0–34.0)
MCHC: 35.2 g/dL (ref 30.0–36.0)
MCV: 92.9 fL (ref 80.0–100.0)
Monocytes Absolute: 0.6 10*3/uL (ref 0.1–1.0)
Monocytes Relative: 10 %
Neutro Abs: 3.7 10*3/uL (ref 1.7–7.7)
Neutrophils Relative %: 62 %
Platelet Count: 141 10*3/uL — ABNORMAL LOW (ref 150–400)
RBC: 5.07 MIL/uL (ref 4.22–5.81)
RDW: 13.5 % (ref 11.5–15.5)
WBC Count: 5.9 10*3/uL (ref 4.0–10.5)
nRBC: 0 % (ref 0.0–0.2)

## 2020-11-30 LAB — SAVE SMEAR(SSMR), FOR PROVIDER SLIDE REVIEW

## 2020-12-02 ENCOUNTER — Other Ambulatory Visit: Payer: Self-pay

## 2020-12-02 ENCOUNTER — Inpatient Hospital Stay (HOSPITAL_BASED_OUTPATIENT_CLINIC_OR_DEPARTMENT_OTHER): Payer: Medicare Other | Admitting: Nurse Practitioner

## 2020-12-02 VITALS — BP 135/73 | HR 74 | Temp 98.0°F | Resp 19 | Ht 70.0 in | Wt 141.2 lb

## 2020-12-02 DIAGNOSIS — D751 Secondary polycythemia: Secondary | ICD-10-CM | POA: Diagnosis not present

## 2020-12-02 NOTE — Progress Notes (Addendum)
New Hematology/Oncology Consult   Requesting MD: Dr. Alain Marion  Reason for Consult: Polycythemia  HPI: Troy Marsh is a 70 year old man referred by Dr. Alain Marion for evaluation of polycythemia.  Past medical history significant for HIV, hypertension, dyslipidemia, hypogonadism.  He has chronic back pain.  He takes hydrochlorothiazide for hypertension.  He is on testosterone replacement 200 mg IM every 14 days.  CBC from 10/20/2020 shows hemoglobin 16.9, hematocrit 46.9, white blood cell count 5.3 and platelets 129.  CBC from 07/06/2020 shows hemoglobin 16.1, hematocrit 45.1, white count 4.1 and platelets 141.     Past Medical History:  Diagnosis Date  . Arthritis   . BPH (benign prostatic hyperplasia)    [facility]  DR. Elie Goody  . Cancer ([place])    Skin cancer- basal and squamous  . Constipation due to pain medication   . HIV disease ([place]) 1985   last CD4 10/02/12 was 277  . Hypertension   . Hypogonadism male   :   Past Surgical History:  Procedure Laterality Date  . ANTERIOR CERVICAL DECOMP/DISCECTOMY FUSION Bilateral 11/22/2017   Procedure: ANTERIOR CERVICAL DECOMPRESSION FUSION CERVICAL 4-5, CERVICAL 5-6, CERVICAL 6-7 WITH INSTRUMENTATION AND ALLOGRAFT TIME      /   REQUESTED 4 HOURS;  Surgeon: Phylliss Bob, MD;  Location: [facility];  Service: Orthopedics;  Laterality: Bilateral;  . BACK SURGERY    . COLONOSCOPY W/ POLYPECTOMY    . EYE SURGERY Bilateral    Lasik, Cataract  . [place]  . INGUINAL HERNIA REPAIR Right 06/11/2018   Procedure: LAPAROSCOPIC RIGHT INGUINAL HERNIA WITH MESH;  Surgeon: Ralene Ok, MD;  Location: [facility];  Service: General;  Laterality: Right;  . INSERTION OF MESH Right 06/11/2018   Procedure: INSERTION OF MESH;  Surgeon: Ralene Ok, MD;  Location: [facility];  Service: General;  Laterality: Right;  . KNEE ARTHROSCOPY Right 2009  . KNEE ARTHROSCOPY Left 2011  . neck surgery  1984  . POSTERIOR CERVICAL FUSION/FORAMINOTOMY N/A  05/29/2018   Procedure: POSTERIOR CERVICAL DECOMPRESSION FUSION CERVICAL 4 - CERVICAL 7 WITH INSTRUMENTATION AND ALLOGRAFT;  Surgeon: Phylliss Bob, MD;  Location: [facility];  Service: Orthopedics;  Laterality: N/A;  :   Current Outpatient Medications:  .  aspirin 81 MG tablet, Take 81 mg by mouth daily., Disp: , Rfl:  .  atorvastatin (LIPITOR) 40 MG tablet, Take 40 mg by mouth at bedtime. , Disp: , Rfl:  .  baclofen (LIORESAL) 20 MG tablet, Take 1 tablet (20 mg total) by mouth 3 (three) times daily., Disp: 270 each, Rfl: 3 .  BD DISP NEEDLES 18G X 1-1/2" MISC, USE AS DIRECTED, Disp: 6 each, Rfl: 3 .  BD DISP NEEDLES 21G X 1-1/2" MISC, USE AS DIRECTED, Disp: 100 each, Rfl: 0 .  celecoxib (CELEBREX) 200 MG capsule, TAKE 1 CAPSULE BY MOUTH  TWICE DAILY, Disp: 180 capsule, Rfl: 3 .  cetirizine-pseudoephedrine (ZYRTEC-D) 5-120 MG tablet, Take 1 tablet by mouth daily as needed for allergies. , Disp: , Rfl:  .  diltiazem 2 % GEL, Diltiazem gel 2% apply rectally twice daily for 2 weeks, Disp: 30 g, Rfl: 0 .  EDEX 20 MCG injection, INJECT (INTRACAVERNOUS)  20MCG AS NEEDED FOR  ERECTILE DYSFUNCTION. USE  NO MORE THAN 3 TIMES PER  WEEK, Disp: 3 each, Rfl: 3 .  emtricitabine-rilpivir-tenofovir AF (ODEFSEY) 200-25-25 MG TABS tablet, Take 1 tablet by mouth daily., Disp: , Rfl:  .  fluorouracil (EFUDEX) 5 % cream, Apply topically 2 (two) times  daily as needed., Disp: , Rfl:  .  hydrochlorothiazide (HYDRODIURIL) 50 MG tablet, TAKE 1 TABLET BY MOUTH  DAILY, Disp: 90 tablet, Rfl: 3 .  HYDROcodone-acetaminophen (NORCO) 7.5-325 MG tablet, Take 1 tablet by mouth every 6 (six) hours as needed for severe pain., Disp: 120 tablet, Rfl: 0 .  HYDROcodone-acetaminophen (NORCO) 7.5-325 MG tablet, Take 1 tablet by mouth every 6 (six) hours as needed for severe pain., Disp: 120 tablet, Rfl: 0 .  HYDROcodone-acetaminophen (NORCO) 7.5-325 MG tablet, Take 1 tablet by mouth every 6 (six) hours as needed for severe pain., Disp: 120  tablet, Rfl: 0 .  linaclotide (LINZESS) 72 MCG capsule, TAKE 1 CAPSULE BY MOUTH  DAILY BEFORE BREAKFAST, Disp: 90 capsule, Rfl: 3 .  Melatonin 10 MG TABS, Take 10 mg by mouth at bedtime as needed (for sleep). , Disp: , Rfl:  .  methocarbamol (ROBAXIN) 500 MG tablet, Take 1 tablet by mouth as needed., Disp: , Rfl:  .  mirabegron ER (MYRBETRIQ) 25 MG TB24 tablet, Take 25 mg by mouth as needed (2 TIMES A WEEK)., Disp: , Rfl:  .  montelukast (SINGULAIR) 10 MG tablet, TAKE 1 TABLET BY MOUTH  DAILY, Disp: 90 tablet, Rfl: 3 .  Multiple Vitamin (MULTIVITAMIN WITH MINERALS) TABS tablet, Take 1 tablet by mouth daily. COMPLETE MULTIVITAMIN MEN'S 50+, Disp: , Rfl:  .  polyethylene glycol powder (GLYCOLAX/MIRALAX) powder, Take 17 g by mouth daily as needed for moderate constipation. , Disp: , Rfl:  .  potassium chloride (K-DUR) 10 MEQ tablet, TAKE 1 TABLET BY MOUTH  DAILY, Disp: 90 tablet, Rfl: 3 .  RESTASIS 0.05 % ophthalmic emulsion, Place 1 drop into both eyes 2 (two) times daily., Disp: , Rfl:  .  tadalafil (CIALIS) 5 MG tablet, TAKE 1 TABLET EVERY DAY, Disp: 90 tablet, Rfl: 3 .  testosterone enanthate (DELATESTRYL) 200 MG/ML injection, Inject 1 mL (200 mg total) into the muscle every 14 (fourteen) days. For IM use only, Disp: 5 mL, Rfl: 4 .  gabapentin (NEURONTIN) 300 MG capsule, Take 1 capsule by mouth 3 (three) times daily., Disp: , Rfl: :  :  No Known Allergies:  FH: No family history of malignancy, blood disorder  SOCIAL HISTORY: Troy Marsh lives in [place].  He is married.  No children.  He is retired from [employer].  No EtOH use.  He quit smoking at age 45.  At the time he quit smoking he estimates 2 packs/day for 35 or 36 years.  Review of Systems: He has never had a blood clot or stroke.  No itching following bathing.  No rash.  No fevers or sweats.  No unusual headaches.  No vision change.  He reports a good appetite.  No weight loss.  He has chronic  low back pain.  No cough or shortness of breath.  No chest pain.  No nausea or vomiting.  No change in bowel habits.  No urinary symptoms.  Physical Exam:  Blood pressure 135/73, pulse 74, temperature 98 F (36.7 C), temperature source Oral, resp. rate 19, height 5\' 10"  (1.778 m), weight 141 lb 3.2 oz (64 kg), SpO2 97 %.  HEENT: Neck without mass. Lungs: Bronchial breath sounds. Cardiac: Distant heart sounds, regular with occasional premature beat. Abdomen: No hepatosplenomegaly. Vascular: No leg edema. Lymph nodes: No palpable cervical, supraclavicular, axillary or inguinal lymph nodes. Neurologic: Alert and oriented.  Follows commands. Skin: Small dark mole at the left abdomen.  A few small  ecchymoses at the lower arms.  LABS:   Recent Labs    11/30/20 0958  WBC 5.9  HGB 16.6  HCT 47.1  PLT 141*  Peripheral blood smear #1 platelets- appear diminished, few large platelets, no platelet clumps; #2 RBC-polychromasia not increased, few teardrops and ovalocytes, rare target and helmet cell; #3 WBC-few 5 lobed polys  No results for input(s): NA, K, CL, CO2, GLUCOSE, BUN, CREATININE, CALCIUM in the last 72 hours.  Assessment and Plan:   1. Chronic mild elevation hemoglobin/hematocrit 2. Chronic mild thrombocytopenia 3. HIV 4. Hypertension 5. Dyslipidemia 6. Hypogonadism on testosterone 7. History of tobacco use.  Troy Marsh has been referred for evaluation of elevated hemoglobin/hematocrit.  This has been present for many years and remains stable.  We discussed potential etiologies including a combination of false elevation related to hydrochlorothiazide and true mild erythrocytosis related to testosterone and ? COPD.  Dr. Benay Spice feels it is unlikely he has a myeloproliferative disorder.  He also has chronic mild thrombocytopenia.  Recommend check CBC periodically, refer back with concerning changes. Also recommend check B12 level with next lab draw.  We did not schedule  follow-up in our office but are available to see him in the future as needed.   Patient seen with Dr. Benay Spice.  Ned Card, NP 12/02/2020, 2:20 PM   Troy Marsh was interviewed and examined.  I reviewed the peripheral blood smear.  He is referred for evaluation of chronic mild elevation of the hemoglobin and hematocrit.  The hemoglobin has been at the upper end of normal range for several years and has not changed significantly.  I suspect he has a true mild erythrocytosis, likely secondary to testosterone use.  COPD could also be contributing.  It is also possible there is a component of plasma contraction from HCTZ.  We have a low clinical suspicion for a myeloproliferative disorder.  I do not recommend additional diagnostic evaluation such as a molecular myeloproliferative panel unless he develops a consistent rise in the hemoglobin/hematocrit or other findings to suggest a myeloproliferative disorder.  I recommend decreasing the testosterone dose if the hemoglobin rises.  He has chronic mild thrombocytopenia, likely a benign normal variant.  This could also be related to polypharmacy or HIV.  We will see him in the future as needed.  I was present for greater than 50% of today's visit.  I performed medical decision making.  Julieanne Manson, MD

## 2020-12-30 ENCOUNTER — Telehealth: Payer: Self-pay

## 2020-12-30 NOTE — Telephone Encounter (Signed)
Patient is requesting Diltiazem gel 2% to be filled. Does he need to be seen first or can we refill the medication? He is in a hemorrhoid flare up with a little bleeding bright red he says and is not on the HC cream currently.

## 2020-12-31 MED ORDER — DILTIAZEM GEL 2 %
CUTANEOUS | 2 refills | Status: DC
Start: 2020-12-31 — End: 2021-06-22

## 2020-12-31 NOTE — Telephone Encounter (Signed)
Pt returned call and was given your message.

## 2020-12-31 NOTE — Telephone Encounter (Signed)
Medication sent to pharmacy and LVM regarding this

## 2021-02-03 DIAGNOSIS — M6530 Trigger finger, unspecified finger: Secondary | ICD-10-CM | POA: Insufficient documentation

## 2021-02-23 ENCOUNTER — Ambulatory Visit (INDEPENDENT_AMBULATORY_CARE_PROVIDER_SITE_OTHER): Payer: Medicare Other | Admitting: Gastroenterology

## 2021-02-23 ENCOUNTER — Encounter: Payer: Self-pay | Admitting: Gastroenterology

## 2021-02-23 ENCOUNTER — Other Ambulatory Visit: Payer: Self-pay

## 2021-02-23 VITALS — BP 126/74 | HR 69 | Ht 70.0 in | Wt 136.4 lb

## 2021-02-23 DIAGNOSIS — K602 Anal fissure, unspecified: Secondary | ICD-10-CM

## 2021-02-23 DIAGNOSIS — K648 Other hemorrhoids: Secondary | ICD-10-CM

## 2021-02-23 DIAGNOSIS — K5900 Constipation, unspecified: Secondary | ICD-10-CM

## 2021-02-23 MED ORDER — LINACLOTIDE 145 MCG PO CAPS: 145.0000 ug | ORAL_CAPSULE | Freq: Every day | ORAL | 11 refills | Status: DC

## 2021-02-23 NOTE — Progress Notes (Signed)
Chief Complaint: FU  Referring Provider:  Cassandria Anger, MD      ASSESSMENT AND PLAN;   #1. Anal fissure and internal hemorrhoids.  #2. H/O constipation (uses hydrocodone, Celebrex), neg colon 09/2018 except for fair quality of preparation.  Plan: - Minimize use of pain medications - Continue Metamucil QD with 8 ounces of juice - Continue miralax 17g po qd. - Linzess 145 mcg po qd (instead of 72, samples for 145/290 given) - Continue prunes.  - Continue diltiazem cream prn. - Recall colon 09/2021    HPI:    Troy Marsh is a 70 y.o. male  With HIV, HTN, OA S/P neck Sx ACD  and right inguinal hernia repair on chronic pain medications For follow-up visit Doing very well except for constipation-now having 1 BM/week.  He continues to be on hydrocodone.  Gabapentin has recently been increased.  He does drink plenty of water.  Continues to use Metamucil and MiraLAX.  Wants to try higher dose of Linzess.    Anal pain occasionally on defecation but better.  He would like to get his colonoscopy performed early next year as he had limited preparation during the last colonoscopy.  Had only 1 episode of rectal bleeding which resolved on its own.  Previous colonoscopy March 2020 as below.  Married to [employer] (patient of ours)  Previous GI procedures: Colonoscopy 09/2018 fair prep. - Non-bleeding internal hemorrhoids. - Otherwise grossly normal colonoscopy. - Repeat in 3 years d/t HIV, quality of prep  Past Medical History:  Diagnosis Date   Arthritis    BPH (benign prostatic hyperplasia)    [facility]  DR. Elie Goody   Cancer [facility])    Skin cancer- basal and squamous   Constipation due to pain medication    HIV disease ([place]) 1985   last CD4 10/02/12 was 277   Hypertension    Hypogonadism male     Past Surgical History:  Procedure Laterality Date   ANTERIOR CERVICAL DECOMP/DISCECTOMY FUSION Bilateral 11/22/2017   Procedure: ANTERIOR CERVICAL  DECOMPRESSION FUSION CERVICAL 4-5, CERVICAL 5-6, CERVICAL 6-7 WITH INSTRUMENTATION AND ALLOGRAFT TIME      /   REQUESTED 4 HOURS;  Surgeon: Phylliss Bob, MD;  Location: [place];  Service: Orthopedics;  Laterality: Bilateral;   BACK SURGERY     COLONOSCOPY W/ POLYPECTOMY     EYE SURGERY Bilateral    Lasik, Cataract   HEMORRHOID SURGERY  1973   INGUINAL HERNIA REPAIR Right 06/11/2018   Procedure: LAPAROSCOPIC RIGHT INGUINAL HERNIA WITH MESH;  Surgeon: Ralene Ok, MD;  Location: [place];  Service: General;  Laterality: Right;   INSERTION OF MESH Right 06/11/2018   Procedure: INSERTION OF MESH;  Surgeon: Ralene Ok, MD;  Location: [place];  Service: General;  Laterality: Right;   KNEE ARTHROSCOPY Right 2009   KNEE ARTHROSCOPY Left 2011   neck surgery  1984   POSTERIOR CERVICAL FUSION/FORAMINOTOMY N/A 05/29/2018   Procedure: POSTERIOR CERVICAL DECOMPRESSION FUSION CERVICAL 4 - CERVICAL 7 WITH INSTRUMENTATION AND ALLOGRAFT;  Surgeon: Phylliss Bob, MD;  Location: [place];  Service: Orthopedics;  Laterality: N/A;    Family History  Problem Relation Age of Onset   Coronary artery disease Father    Heart attack Father    Hypertension Father    Hyperlipidemia Father    Cancer Father        prostate   Colitis Maternal Grandmother    Diabetes Neg Hx    Colon cancer Neg Hx  Rectal cancer Neg Hx    Stomach cancer Neg Hx     Social History   Tobacco Use   Smoking status: Former    Packs/day: 2.00    Years: 35.00    Pack years: 70.00    Types: Cigarettes    Quit date: 02/10/2004    Years since quitting: 17.0   Smokeless tobacco: Never  Vaping Use   Vaping Use: Never used  Substance Use Topics   Alcohol use: No    Comment: quit 2004   Drug use: No    Current Outpatient Medications  Medication Sig Dispense Refill   aspirin 81 MG tablet Take 81 mg by mouth daily.     baclofen (LIORESAL) 20 MG tablet Take 1 tablet (20 mg total) by mouth 3 (three) times daily. 270 each 3    BD DISP NEEDLES 18G X 1-1/2" MISC USE AS DIRECTED 6 each 3   BD DISP NEEDLES 21G X 1-1/2" MISC USE AS DIRECTED 100 each 0   celecoxib (CELEBREX) 200 MG capsule TAKE 1 CAPSULE BY MOUTH  TWICE DAILY 180 capsule 3   cetirizine-pseudoephedrine (ZYRTEC-D) 5-120 MG tablet Take 1 tablet by mouth daily as needed for allergies.      diltiazem 2 % GEL Diltiazem gel 2% apply rectally twice daily for 2 weeks 30 g 2   EDEX 20 MCG injection INJECT (INTRACAVERNOUS)  20MCG AS NEEDED FOR  ERECTILE DYSFUNCTION. USE  NO MORE THAN 3 TIMES PER  WEEK 3 each 3   emtricitabine-rilpivir-tenofovir AF (ODEFSEY) 200-25-25 MG TABS tablet Take 1 tablet by mouth daily.     fluorouracil (EFUDEX) 5 % cream Apply topically 2 (two) times daily as needed.     gabapentin (NEURONTIN) 300 MG capsule Take 1 capsule by mouth 3 (three) times daily.     hydrochlorothiazide (HYDRODIURIL) 50 MG tablet TAKE 1 TABLET BY MOUTH  DAILY 90 tablet 3   HYDROcodone-acetaminophen (NORCO) 7.5-325 MG tablet Take 1 tablet by mouth every 6 (six) hours as needed for severe pain. 120 tablet 0   HYDROcodone-acetaminophen (NORCO) 7.5-325 MG tablet Take 1 tablet by mouth every 6 (six) hours as needed for severe pain. 120 tablet 0   HYDROcodone-acetaminophen (NORCO) 7.5-325 MG tablet Take 1 tablet by mouth every 6 (six) hours as needed for severe pain. 120 tablet 0   linaclotide (LINZESS) 72 MCG capsule TAKE 1 CAPSULE BY MOUTH  DAILY BEFORE BREAKFAST 90 capsule 3   Melatonin 10 MG TABS Take 10 mg by mouth at bedtime as needed (for sleep).      methocarbamol (ROBAXIN) 500 MG tablet Take 1 tablet by mouth as needed.     mirabegron ER (MYRBETRIQ) 25 MG TB24 tablet Take 25 mg by mouth as needed (2 TIMES A WEEK).     montelukast (SINGULAIR) 10 MG tablet TAKE 1 TABLET BY MOUTH  DAILY 90 tablet 3   Multiple Vitamin (MULTIVITAMIN WITH MINERALS) TABS tablet Take 1 tablet by mouth daily. COMPLETE MULTIVITAMIN MEN'S 50+     polyethylene glycol powder (GLYCOLAX/MIRALAX)  powder Take 17 g by mouth daily as needed for moderate constipation.      RESTASIS 0.05 % ophthalmic emulsion Place 1 drop into both eyes 2 (two) times daily.     tadalafil (CIALIS) 5 MG tablet TAKE 1 TABLET EVERY DAY 90 tablet 3   testosterone enanthate (DELATESTRYL) 200 MG/ML injection Inject 1 mL (200 mg total) into the muscle every 14 (fourteen) days. For IM use only 5 mL 4  atorvastatin (LIPITOR) 40 MG tablet Take 40 mg by mouth at bedtime.      potassium chloride (KLOR-CON) 10 MEQ tablet Take 10 mEq by mouth daily.     No current facility-administered medications for this visit.    No Known Allergies  Review of Systems:  Negative except for HPI.    Physical Exam:    BP 126/74   Pulse 69   Ht '5\' 10"'$  (1.778 m)   Wt 136 lb 6 oz (61.9 kg)   BMI 19.57 kg/m  Filed Weights   02/23/21 1413  Weight: 136 lb 6 oz (61.9 kg)   Constitutional:  Well-developed, in no acute distress. Psychiatric: Normal mood and affect. Behavior is normal. HEENT: Pupils normal.  Conjunctivae are normal. No scleral icterus. Cardiovascular: Normal rate, regular rhythm. No edema Pulmonary/chest: Effort normal and breath sounds normal. No wheezing, rales or rhonchi. Abdominal: Soft, nondistended. Nontender. Bowel sounds active throughout. There are no masses palpable. No hepatomegaly. Rectal: Previously -Small healing posterior anal fissure, small internal hemorrhoids, stool brown heme-negative.  Examined in presence of Trisha. Neurological: Alert and oriented to person place and time. Skin: Skin is warm and dry. No rashes noted.  Data Reviewed: I have personally reviewed following labs and imaging studies  CBC: CBC Latest Ref Rng & Units 11/30/2020 07/06/2020 05/13/2020  WBC 4.0 - 10.5 K/uL 5.9 4.1 4.2  Hemoglobin 13.0 - 17.0 g/dL 16.6 16.1 16.7  Hematocrit 39.0 - 52.0 % 47.1 45.1 48.0  Platelets 150 - 400 K/uL 141(L) 141(L) 151.0    CMP: CMP Latest Ref Rng & Units 07/06/2020 05/13/2020 05/13/2020   Glucose 70 - 99 mg/dL 73 63(L) -  BUN 8 - 23 mg/dL 9 11 -  Creatinine 0.61 - 1.24 mg/dL 1.06 1.21 -  Sodium 135 - 145 mmol/L 131(L) 137 -  Potassium 3.5 - 5.1 mmol/L 3.5 4.0 -  Chloride 98 - 111 mmol/L 93(L) 96 -  CO2 22 - 32 mmol/L 31 34(H) -  Calcium 8.9 - 10.3 mg/dL 9.0 9.1 -  Total Protein 6.5 - 8.1 g/dL 6.0(L) 5.9(L) 5.9(L)  Total Bilirubin 0.3 - 1.2 mg/dL 0.8 1.0 1.0  Alkaline Phos 38 - 126 U/L 46 52 52  AST 15 - 41 U/L '24 23 23  '$ ALT 0 - 44 U/L '16 17 17  '$ I spent 25 minutes of face-to-face time with the patient. Greater than 50% of the time was spent counseling and coordinating care.     Carmell Austria, MD 02/23/2021, 2:20 PM  Cc: Plotnikov, Evie Lacks, MD

## 2021-02-23 NOTE — Patient Instructions (Addendum)
If you are age 71 or older, your body mass index should be between 23-30. Your Body mass index is 19.57 kg/m. If this is out of the aforementioned range listed, please consider follow up with your Primary Care Provider.  If you are age 12 or younger, your body mass index should be between 19-25. Your Body mass index is 19.57 kg/m. If this is out of the aformentioned range listed, please consider follow up with your Primary Care Provider.   __________________________________________________________  The [facility] providers would like to encourage you to use [facility] to communicate with providers for non-urgent requests or questions.  Due to long hold times on the telephone, sending your provider a message by [facility] may be a faster and more efficient way to get a response.  Please allow 48 business hours for a response.  Please remember that this is for non-urgent requests.   We have sent the following medications to your pharmacy for you to pick up at your convenience: Linzess 145 mcg   You will be due for a recall colonoscopy in 09/2021. We will send you a reminder in the mail when it gets closer to that time.    Thank you,  Dr. Jackquline Denmark

## 2021-03-11 ENCOUNTER — Other Ambulatory Visit: Payer: Self-pay

## 2021-03-11 MED ORDER — LINACLOTIDE 145 MCG PO CAPS: 145.0000 ug | ORAL_CAPSULE | Freq: Every day | ORAL | 3 refills | Status: DC

## 2021-03-12 ENCOUNTER — Other Ambulatory Visit: Payer: Self-pay | Admitting: Internal Medicine

## 2021-03-23 ENCOUNTER — Other Ambulatory Visit: Payer: Self-pay | Admitting: Internal Medicine

## 2021-03-28 ENCOUNTER — Other Ambulatory Visit: Payer: Self-pay | Admitting: Internal Medicine

## 2021-03-28 ENCOUNTER — Other Ambulatory Visit: Payer: Self-pay | Admitting: Gastroenterology

## 2021-03-29 ENCOUNTER — Telehealth: Payer: Self-pay | Admitting: Internal Medicine

## 2021-03-29 MED ORDER — "BD DISP NEEDLES 18G X 1-1/2"" MISC": 3 refills | Status: DC

## 2021-03-29 MED ORDER — CAVERJECT 20 MCG IC SOLR: INTRACAVERNOUS | 3 refills | Status: DC

## 2021-03-29 NOTE — Telephone Encounter (Signed)
Patient is requesting rx for Caverject & BD DISP NEEDLES 18G X 1-1/2" MISC be sent to pharmacy:  [facility] ([facility]) - [place], [place]  Phone:  [phone] Fax:  [phone]  Pharmacy advised patient they were out of the [place] 20 MCG injection but did have the Caverject in stock  **Patient would also like rx sent as 90DS**

## 2021-03-29 NOTE — Telephone Encounter (Signed)
Reviewed chart pt is up-to-date sent refills to [facility].Marland KitchenJohny Marsh

## 2021-03-31 NOTE — Telephone Encounter (Signed)
Follow up message patient states order must be written as Caverject IMPULSE  Kit

## 2021-04-01 MED ORDER — CAVERJECT IMPULSE 20 MCG IC KIT: 20.0000 ug | PACK | INTRACAVERNOUS | 3 refills | Status: DC | PRN

## 2021-04-01 NOTE — Telephone Encounter (Signed)
Duplicate msg.. see previous mychart msg rx has been sent to [facility]

## 2021-05-09 ENCOUNTER — Other Ambulatory Visit: Payer: Self-pay | Admitting: Gastroenterology

## 2021-05-18 ENCOUNTER — Other Ambulatory Visit: Payer: Self-pay

## 2021-05-18 ENCOUNTER — Ambulatory Visit (INDEPENDENT_AMBULATORY_CARE_PROVIDER_SITE_OTHER): Payer: Medicare Other | Admitting: Internal Medicine

## 2021-05-18 ENCOUNTER — Encounter: Payer: Self-pay | Admitting: Internal Medicine

## 2021-05-18 VITALS — BP 152/78 | HR 62 | Temp 97.8°F | Ht 70.0 in | Wt 136.0 lb

## 2021-05-18 DIAGNOSIS — Z0000 Encounter for general adult medical examination without abnormal findings: Secondary | ICD-10-CM | POA: Diagnosis not present

## 2021-05-18 DIAGNOSIS — E291 Testicular hypofunction: Secondary | ICD-10-CM | POA: Diagnosis not present

## 2021-05-18 DIAGNOSIS — I700 Atherosclerosis of aorta: Secondary | ICD-10-CM | POA: Diagnosis not present

## 2021-05-18 LAB — URINALYSIS
Bilirubin Urine: NEGATIVE
Hgb urine dipstick: NEGATIVE
Ketones, ur: NEGATIVE
Leukocytes,Ua: NEGATIVE
Nitrite: NEGATIVE
Specific Gravity, Urine: 1.01 (ref 1.000–1.030)
Total Protein, Urine: NEGATIVE
Urine Glucose: NEGATIVE
Urobilinogen, UA: 0.2 (ref 0.0–1.0)
pH: 7.5 (ref 5.0–8.0)

## 2021-05-18 LAB — TESTOSTERONE: Testosterone: 570.28 ng/dL (ref 300.00–890.00)

## 2021-05-18 LAB — TSH: TSH: 2.69 u[IU]/mL (ref 0.35–5.50)

## 2021-05-18 NOTE — Assessment & Plan Note (Signed)
We discussed age appropriate health related issues, including available/recomended screening tests and vaccinations. Labs were ordered to be later reviewed . All questions were answered. We discussed one or more of the following - seat belt use, use of sunscreen/sun exposure exercise, fall risk reduction, second hand smoke exposure, firearm use and storage, seat belt use, a need for adhering to healthy diet and exercise. Labs were ordered.  All questions were answered. Rectal - per Urology Colonoscopy  2015, 2020 Immunizations: Tetanus June '10; Shingles vaccine Jan '11, 2016

## 2021-05-18 NOTE — Addendum Note (Signed)
Addended by: Boris Lown B on: 05/18/2021 09:01 AM   Modules accepted: Orders

## 2021-05-18 NOTE — Progress Notes (Signed)
Subjective:  Patient ID: Troy Marsh, male    DOB: 05-Jul-1951  Age: 70 y.o. MRN: [id number]  CC: Annual Exam   HPI Troy Marsh presents for a well exam   Outpatient Medications Prior to Visit  Medication Sig Dispense Refill   alprostadil (CAVERJECT IMPULSE) 20 MCG injection 20 mcg by Intracavitary route as needed for erectile dysfunction. use no more than 3 times per week 3 each 3   aspirin 81 MG tablet Take 81 mg by mouth daily.     baclofen (LIORESAL) 20 MG tablet Take 1 tablet (20 mg total) by mouth 3 (three) times daily. 270 each 3   BD DISP NEEDLES 21G X 1-1/2" MISC USE AS DIRECTED 100 each 0   celecoxib (CELEBREX) 200 MG capsule TAKE 1 CAPSULE BY MOUTH  TWICE DAILY 180 capsule 3   cetirizine-pseudoephedrine (ZYRTEC-D) 5-120 MG tablet Take 1 tablet by mouth daily as needed for allergies.      diltiazem 2 % GEL Diltiazem gel 2% apply rectally twice daily for 2 weeks 30 g 2   EDEX 20 MCG injection INJECT (INTRACAVERNOUS)  20MCG AS NEEDED FOR  ERECTILE DYSFUNCTION. USE  NO MORE THAN 3 TIMES PER  WEEK 3 each 3   emtricitabine-rilpivir-tenofovir AF (ODEFSEY) 200-25-25 MG TABS tablet Take 1 tablet by mouth daily.     fluorouracil (EFUDEX) 5 % cream Apply topically 2 (two) times daily as needed.     gabapentin (NEURONTIN) 300 MG capsule Take 1 capsule by mouth 3 (three) times daily.     hydrochlorothiazide (HYDRODIURIL) 50 MG tablet TAKE 1 TABLET BY MOUTH  DAILY 90 tablet 0   HYDROcodone-acetaminophen (NORCO) 7.5-325 MG tablet Take 1 tablet by mouth every 6 (six) hours as needed for severe pain. 120 tablet 0   HYDROcodone-acetaminophen (NORCO) 7.5-325 MG tablet Take 1 tablet by mouth every 6 (six) hours as needed for severe pain. 120 tablet 0   HYDROcodone-acetaminophen (NORCO) 7.5-325 MG tablet Take 1 tablet by mouth every 6 (six) hours as needed for severe pain. 120 tablet 0   hydrocortisone (ANUSOL-HC) 25 MG suppository UNWRAP AND INSERT 1  SUPPOSITORY RECTALLY TWICE  DAILY  156 suppository 0   linaclotide (LINZESS) 145 MCG CAPS capsule Take 1 capsule (145 mcg total) by mouth daily before breakfast. 90 capsule 3   Melatonin 10 MG TABS Take 10 mg by mouth at bedtime as needed (for sleep).      methocarbamol (ROBAXIN) 500 MG tablet Take 1 tablet by mouth as needed.     mirabegron ER (MYRBETRIQ) 25 MG TB24 tablet Take 25 mg by mouth as needed (2 TIMES A WEEK).     montelukast (SINGULAIR) 10 MG tablet TAKE 1 TABLET BY MOUTH  DAILY 90 tablet 3   Multiple Vitamin (MULTIVITAMIN WITH MINERALS) TABS tablet Take 1 tablet by mouth daily. COMPLETE MULTIVITAMIN MEN'S 50+     NEEDLE, DISP, 18 G (BD DISP NEEDLES) 18G X 1-1/2" MISC USE AS DIRECTED 50 each 3   polyethylene glycol powder (GLYCOLAX/MIRALAX) powder Take 17 g by mouth daily as needed for moderate constipation.      potassium chloride (KLOR-CON) 10 MEQ tablet TAKE 1 TABLET BY MOUTH  DAILY 90 tablet 3   RESTASIS 0.05 % ophthalmic emulsion Place 1 drop into both eyes 2 (two) times daily.     tadalafil (CIALIS) 5 MG tablet TAKE 1 TABLET EVERY DAY 90 tablet 3   testosterone enanthate (DELATESTRYL) 200 MG/ML injection Inject 1 mL (200 mg total) into  the muscle every 14 (fourteen) days. For IM use only 5 mL 4   atorvastatin (LIPITOR) 40 MG tablet Take 40 mg by mouth at bedtime.      No facility-administered medications prior to visit.    ROS: Review of Systems  Constitutional:  Negative for appetite change, fatigue and unexpected weight change.  HENT:  Negative for congestion, nosebleeds, sneezing, sore throat and trouble swallowing.   Eyes:  Negative for itching and visual disturbance.  Respiratory:  Negative for cough.   Cardiovascular:  Negative for chest pain, palpitations and leg swelling.  Gastrointestinal:  Negative for abdominal distention, blood in stool, diarrhea and nausea.  Genitourinary:  Positive for frequency. Negative for hematuria.  Musculoskeletal:  Negative for back pain, gait problem, joint swelling  and neck pain.  Skin:  Negative for rash.  Neurological:  Negative for dizziness, tremors, speech difficulty and weakness.  Psychiatric/Behavioral:  Negative for agitation, dysphoric mood and sleep disturbance. The patient is not nervous/anxious.    Objective:  BP (!) 152/78 (BP Location: Left Arm, Patient Position: Sitting, Cuff Size: Normal)   Pulse 62   Temp 97.8 F (36.6 C) (Oral)   Ht 5\' 10"  (1.778 m)   Wt 136 lb (61.7 kg)   SpO2 100%   BMI 19.51 kg/m   BP Readings from Last 3 Encounters:  05/18/21 (!) 152/78  02/23/21 126/74  12/02/20 135/73    Wt Readings from Last 3 Encounters:  05/18/21 136 lb (61.7 kg)  02/23/21 136 lb 6 oz (61.9 kg)  12/02/20 141 lb 3.2 oz (64 kg)    Physical Exam Constitutional:      General: He is not in acute distress.    Appearance: He is well-developed.     Comments: NAD  Eyes:     Conjunctiva/sclera: Conjunctivae normal.     Pupils: Pupils are equal, round, and reactive to light.  Neck:     Thyroid: No thyromegaly.     Vascular: No JVD.  Cardiovascular:     Rate and Rhythm: Normal rate and regular rhythm.     Heart sounds: Normal heart sounds. No murmur heard.   No friction rub. No gallop.  Pulmonary:     Effort: Pulmonary effort is normal. No respiratory distress.     Breath sounds: Normal breath sounds. No wheezing or rales.  Chest:     Chest wall: No tenderness.  Abdominal:     General: Bowel sounds are normal. There is no distension.     Palpations: Abdomen is soft. There is no mass.     Tenderness: There is no abdominal tenderness. There is no guarding or rebound.  Musculoskeletal:        General: No tenderness. Normal range of motion.     Cervical back: Normal range of motion.  Lymphadenopathy:     Cervical: No cervical adenopathy.  Skin:    General: Skin is warm and dry.     Findings: No rash.  Neurological:     Mental Status: He is alert and oriented to person, place, and time.     Cranial Nerves: No cranial nerve  deficit.     Motor: No abnormal muscle tone.     Coordination: Coordination normal.     Gait: Gait normal.     Deep Tendon Reflexes: Reflexes are normal and symmetric.  Psychiatric:        Behavior: Behavior normal.        Thought Content: Thought content normal.  Judgment: Judgment normal.   Rectal - per GI, Urology Lab Results  Component Value Date   WBC 5.9 11/30/2020   HGB 16.6 11/30/2020   HCT 47.1 11/30/2020   PLT 141 (L) 11/30/2020   GLUCOSE 73 07/06/2020   CHOL 131 05/13/2020   TRIG 52.0 05/13/2020   HDL 71.90 05/13/2020   LDLDIRECT 113.3 05/21/2006   LDLCALC 49 05/13/2020   ALT 16 07/06/2020   AST 24 07/06/2020   NA 131 (L) 07/06/2020   K 3.5 07/06/2020   CL 93 (L) 07/06/2020   CREATININE 1.06 07/06/2020   BUN 9 07/06/2020   CO2 31 07/06/2020   TSH 2.14 05/13/2020   PSA 1.18 05/13/2020   INR 1.0 07/06/2020    CT ABDOMEN PELVIS W CONTRAST  Result Date: 07/06/2020 CLINICAL DATA:  Rectal bleeding for 1 week. EXAM: CT ABDOMEN AND PELVIS WITH CONTRAST TECHNIQUE: Multidetector CT imaging of the abdomen and pelvis was performed using the standard protocol following bolus administration of intravenous contrast. CONTRAST:  75 mL OMNIPAQUE IOHEXOL 300 MG/ML  SOLN COMPARISON:  None. FINDINGS: Lower chest: Lung bases are clear. No pleural or pericardial effusion. Hepatobiliary: 0.4 cm hyperattenuating focus in the posterior right hepatic lobe is likely a flash filling hemangioma. The liver is otherwise negative. Gallbladder and biliary tree appear normal. Pancreas: Unremarkable. No pancreatic ductal dilatation or surrounding inflammatory changes. Spleen: Normal in size without focal abnormality. Adrenals/Urinary Tract: Adrenal glands are unremarkable. Kidneys are normal, without renal calculi, solid lesion, or hydronephrosis. Small left renal cyst incidentally noted. Bladder is unremarkable. Stomach/Bowel: Stomach is within normal limits. Appendix appears normal. No evidence  of bowel wall thickening, distention, or inflammatory changes. Vascular/Lymphatic: Aortic atherosclerosis. No enlarged abdominal or pelvic lymph nodes. Reproductive: Prostatomegaly. Other: Surgical clips in the pelvis noted. Musculoskeletal: No fracture or worrisome lesion. Schmorl's nodes at L3-4 and lower lumbar spondylosis noted. IMPRESSION: No acute abnormality.  No finding to explain rectal bleeding. Prostatomegaly. Aortic Atherosclerosis (ICD10-I70.0). Electronically Signed   By: Inge Rise M.D.   On: 07/06/2020 14:49    Assessment & Plan:   Problem List Items Addressed This Visit     Atherosclerosis of aorta ([place])    On Lipitor      Well adult exam - Primary    We discussed age appropriate health related issues, including available/recomended screening tests and vaccinations. Labs were ordered to be later reviewed . All questions were answered. We discussed one or more of the following - seat belt use, use of sunscreen/sun exposure exercise, fall risk reduction, second hand smoke exposure, firearm use and storage, seat belt use, a need for adhering to healthy diet and exercise. Labs were ordered.  All questions were answered. Rectal - per Urology Colonoscopy  2015, 2020 Immunizations: Tetanus June '10; Shingles vaccine Jan '11, 2016         No orders of the defined types were placed in this encounter.     Follow-up: No follow-ups on file.  [name], MD

## 2021-05-18 NOTE — Assessment & Plan Note (Signed)
On Lipitor 

## 2021-06-14 ENCOUNTER — Other Ambulatory Visit: Payer: Self-pay | Admitting: Internal Medicine

## 2021-06-14 ENCOUNTER — Other Ambulatory Visit: Payer: Self-pay | Admitting: Gastroenterology

## 2021-06-16 ENCOUNTER — Ambulatory Visit: Payer: Medicare Other | Admitting: Gastroenterology

## 2021-06-22 ENCOUNTER — Ambulatory Visit (INDEPENDENT_AMBULATORY_CARE_PROVIDER_SITE_OTHER): Payer: Medicare Other | Admitting: Gastroenterology

## 2021-06-22 ENCOUNTER — Encounter: Payer: Self-pay | Admitting: Gastroenterology

## 2021-06-22 ENCOUNTER — Other Ambulatory Visit: Payer: Self-pay

## 2021-06-22 VITALS — BP 158/92 | HR 65 | Ht 70.0 in | Wt 141.5 lb

## 2021-06-22 DIAGNOSIS — K5900 Constipation, unspecified: Secondary | ICD-10-CM | POA: Diagnosis not present

## 2021-06-22 DIAGNOSIS — K648 Other hemorrhoids: Secondary | ICD-10-CM

## 2021-06-22 DIAGNOSIS — K602 Anal fissure, unspecified: Secondary | ICD-10-CM | POA: Diagnosis not present

## 2021-06-22 MED ORDER — LINACLOTIDE 290 MCG PO CAPS: 290.0000 ug | ORAL_CAPSULE | Freq: Every day | ORAL | 4 refills | Status: DC

## 2021-06-22 MED ORDER — DILTIAZEM GEL 2 %: CUTANEOUS | 6 refills | Status: DC

## 2021-06-22 NOTE — Progress Notes (Signed)
Chief Complaint: FU  Referring Provider:  Cassandria Anger, MD      ASSESSMENT AND PLAN;   #1. Anal fissure and internal hemorrhoids.  #2. H/O constipation (uses hydrocodone, Celebrex), neg colon 09/2018 except for fair quality of preparation.  Plan: - Minimize use of pain medications - Continue Metamucil QD with 8 ounces of juice - Continue miralax 17g po qd. - Linzess 290 QD #90, 4 refills - Continue prunes.  - diltiazem cream 2% gel BID x 4 weeks 6 refills - Recall colon Jan 2023 with 2 day prep    HPI:    Troy Marsh is a 70 y.o. male  With HIV, HTN, OA S/P neck Sx ACD  and right inguinal hernia repair on chronic pain medications For follow-up visit  Going to [place] Feb 3-10  Doing very well except for constipation-now having 1 BM/week.  He continues to be on hydrocodone.  Gabapentin has recently been increased.  He does drink plenty of water.  Continues to use Metamucil and MiraLAX.  Wants to try higher dose of Linzess.    Anal pain occasionally on defecation but better.  He would like to get his colonoscopy performed early next year as he had limited preparation during the last colonoscopy.  Had only 1 episode of rectal bleeding which resolved on its own.  Previous colonoscopy March 2020 as below.  Married to [employer] (patient of ours)  Previous GI procedures: Colonoscopy 09/2018 fair prep. - Non-bleeding internal hemorrhoids. - Otherwise grossly normal colonoscopy. - Repeat in 3 years d/t HIV, quality of prep  CT AP 06/2020 No acute abnormality.  No finding to explain rectal bleeding. Prostatomegaly. Aortic Atherosclerosis (ICD10-I70.0).  Past Medical History:  Diagnosis Date   Arthritis    BPH (benign prostatic hyperplasia)    [facility]  DR. Elie Goody   Cancer [facility])    Skin cancer- basal and squamous   Constipation due to pain medication    HIV disease ([place]) 1985   last CD4 10/02/12 was 277   Hypertension     Hypogonadism male     Past Surgical History:  Procedure Laterality Date   ANTERIOR CERVICAL DECOMP/DISCECTOMY FUSION Bilateral 11/22/2017   Procedure: ANTERIOR CERVICAL DECOMPRESSION FUSION CERVICAL 4-5, CERVICAL 5-6, CERVICAL 6-7 WITH INSTRUMENTATION AND ALLOGRAFT TIME      /   REQUESTED 4 HOURS;  Surgeon: Phylliss Bob, MD;  Location: [place];  Service: Orthopedics;  Laterality: Bilateral;   BACK SURGERY     COLONOSCOPY W/ POLYPECTOMY     EYE SURGERY Bilateral    Lasik, Cataract   HEMORRHOID SURGERY  1973   INGUINAL HERNIA REPAIR Right 06/11/2018   Procedure: LAPAROSCOPIC RIGHT INGUINAL HERNIA WITH MESH;  Surgeon: Ralene Ok, MD;  Location: [place];  Service: General;  Laterality: Right;   INSERTION OF MESH Right 06/11/2018   Procedure: INSERTION OF MESH;  Surgeon: Ralene Ok, MD;  Location: [place];  Service: General;  Laterality: Right;   KNEE ARTHROSCOPY Right 2009   KNEE ARTHROSCOPY Left 2011   neck surgery  1984   POSTERIOR CERVICAL FUSION/FORAMINOTOMY N/A 05/29/2018   Procedure: POSTERIOR CERVICAL DECOMPRESSION FUSION CERVICAL 4 - CERVICAL 7 WITH INSTRUMENTATION AND ALLOGRAFT;  Surgeon: Phylliss Bob, MD;  Location: [place];  Service: Orthopedics;  Laterality: N/A;    Family History  Problem Relation Age of Onset   Coronary artery disease Father    Heart attack Father    Hypertension Father    Hyperlipidemia Father  Cancer Father        prostate   Colitis Maternal Grandmother    Diabetes Neg Hx    Colon cancer Neg Hx    Rectal cancer Neg Hx    Stomach cancer Neg Hx     Social History   Tobacco Use   Smoking status: Former    Packs/day: 2.00    Years: 35.00    Pack years: 70.00    Types: Cigarettes    Quit date: 02/10/2004    Years since quitting: 17.3   Smokeless tobacco: Never  Vaping Use   Vaping Use: Never used  Substance Use Topics   Alcohol use: No    Comment: quit 2004   Drug use: No    Current Outpatient Medications  Medication Sig  Dispense Refill   alprostadil (CAVERJECT IMPULSE) 20 MCG injection 20 mcg by Intracavitary route as needed for erectile dysfunction. use no more than 3 times per week 3 each 3   aspirin 81 MG tablet Take 81 mg by mouth daily.     baclofen (LIORESAL) 20 MG tablet Take 1 tablet (20 mg total) by mouth 3 (three) times daily. 270 each 3   BD DISP NEEDLES 21G X 1-1/2" MISC USE AS DIRECTED 100 each 0   celecoxib (CELEBREX) 200 MG capsule TAKE 1 CAPSULE BY MOUTH  TWICE DAILY 180 capsule 3   cetirizine-pseudoephedrine (ZYRTEC-D) 5-120 MG tablet Take 1 tablet by mouth daily as needed for allergies.      diltiazem 2 % GEL Diltiazem gel 2% apply rectally twice daily for 2 weeks 30 g 2   EDEX 20 MCG injection INJECT (INTRACAVERNOUS)  20MCG AS NEEDED FOR  ERECTILE DYSFUNCTION. USE  NO MORE THAN 3 TIMES PER  WEEK 3 each 3   emtricitabine-rilpivir-tenofovir AF (ODEFSEY) 200-25-25 MG TABS tablet Take 1 tablet by mouth daily.     fluorouracil (EFUDEX) 5 % cream Apply topically 2 (two) times daily as needed.     gabapentin (NEURONTIN) 300 MG capsule Take 1 capsule by mouth 3 (three) times daily.     hydrochlorothiazide (HYDRODIURIL) 50 MG tablet TAKE 1 TABLET BY MOUTH  DAILY 90 tablet 3   HYDROcodone-acetaminophen (NORCO) 7.5-325 MG tablet Take 1 tablet by mouth every 6 (six) hours as needed for severe pain. 120 tablet 0   HYDROcodone-acetaminophen (NORCO) 7.5-325 MG tablet Take 1 tablet by mouth every 6 (six) hours as needed for severe pain. 120 tablet 0   HYDROcodone-acetaminophen (NORCO) 7.5-325 MG tablet Take 1 tablet by mouth every 6 (six) hours as needed for severe pain. 120 tablet 0   hydrocortisone (ANUSOL-HC) 25 MG suppository UNWRAP AND INSERT 1  SUPPOSITORY RECTALLY TWICE  DAILY 180 suppository 0   linaclotide (LINZESS) 145 MCG CAPS capsule Take 1 capsule (145 mcg total) by mouth daily before breakfast. 90 capsule 3   Melatonin 10 MG TABS Take 10 mg by mouth at bedtime as needed (for sleep).       methocarbamol (ROBAXIN) 500 MG tablet Take 1 tablet by mouth as needed.     mirabegron ER (MYRBETRIQ) 25 MG TB24 tablet Take 25 mg by mouth as needed (2 TIMES A WEEK).     montelukast (SINGULAIR) 10 MG tablet TAKE 1 TABLET BY MOUTH  DAILY 90 tablet 3   Multiple Vitamin (MULTIVITAMIN WITH MINERALS) TABS tablet Take 1 tablet by mouth daily. COMPLETE MULTIVITAMIN MEN'S 50+     NEEDLE, DISP, 18 G (BD DISP NEEDLES) 18G X 1-1/2" MISC USE AS DIRECTED 50  each 3   polyethylene glycol powder (GLYCOLAX/MIRALAX) powder Take 17 g by mouth daily as needed for moderate constipation.      potassium chloride (KLOR-CON) 10 MEQ tablet TAKE 1 TABLET BY MOUTH  DAILY 90 tablet 3   RESTASIS 0.05 % ophthalmic emulsion Place 1 drop into both eyes 2 (two) times daily.     tadalafil (CIALIS) 5 MG tablet TAKE 1 TABLET EVERY DAY 90 tablet 3   testosterone enanthate (DELATESTRYL) 200 MG/ML injection Inject 1 mL (200 mg total) into the muscle every 14 (fourteen) days. For IM use only 5 mL 4   atorvastatin (LIPITOR) 40 MG tablet Take 40 mg by mouth at bedtime.      No current facility-administered medications for this visit.    No Known Allergies  Review of Systems:  Negative except for HPI.    Physical Exam:    BP (!) 158/92 (BP Location: Left Arm, Patient Position: Sitting, Cuff Size: Normal)   Pulse 65   Ht 5\' 10"  (1.778 m)   Wt 141 lb 8 oz (64.2 kg)   SpO2 97%   BMI 20.30 kg/m  Filed Weights   06/22/21 0858  Weight: 141 lb 8 oz (64.2 kg)   Constitutional:  Well-developed, in no acute distress. Psychiatric: Normal mood and affect. Behavior is normal. HEENT: Pupils normal.  Conjunctivae are normal. No scleral icterus. Cardiovascular: Normal rate, regular rhythm. No edema Pulmonary/chest: Effort normal and breath sounds normal. No wheezing, rales or rhonchi. Abdominal: Soft, nondistended. Nontender. Bowel sounds active throughout. There are no masses palpable. No hepatomegaly. Rectal: To be performed the  time of colonoscopy Neurological: Alert and oriented to person place and time. Skin: Skin is warm and dry. No rashes noted.  Data Reviewed: I have personally reviewed following labs and imaging studies  CBC: CBC Latest Ref Rng & Units 11/30/2020 07/06/2020 05/13/2020  WBC 4.0 - 10.5 K/uL 5.9 4.1 4.2  Hemoglobin 13.0 - 17.0 g/dL 16.6 16.1 16.7  Hematocrit 39.0 - 52.0 % 47.1 45.1 48.0  Platelets 150 - 400 K/uL 141(L) 141(L) 151.0    CMP: CMP Latest Ref Rng & Units 07/06/2020 05/13/2020 05/13/2020  Glucose 70 - 99 mg/dL 73 63(L) -  BUN 8 - 23 mg/dL 9 11 -  Creatinine 0.61 - 1.24 mg/dL 1.06 1.21 -  Sodium 135 - 145 mmol/L 131(L) 137 -  Potassium 3.5 - 5.1 mmol/L 3.5 4.0 -  Chloride 98 - 111 mmol/L 93(L) 96 -  CO2 22 - 32 mmol/L 31 34(H) -  Calcium 8.9 - 10.3 mg/dL 9.0 9.1 -  Total Protein 6.5 - 8.1 g/dL 6.0(L) 5.9(L) 5.9(L)  Total Bilirubin 0.3 - 1.2 mg/dL 0.8 1.0 1.0  Alkaline Phos 38 - 126 U/L 46 52 52  AST 15 - 41 U/L 24 23 23   ALT 0 - 44 U/L 16 17 17   I spent 25 minutes of face-to-face time with the patient. Greater than 50% of the time was spent counseling and coordinating care.     Carmell Austria, MD 06/22/2021, 9:16 AM  Cc: Plotnikov, Evie Lacks, MD

## 2021-06-22 NOTE — Patient Instructions (Addendum)
If you are age 71 or older, your body mass index should be between 23-30. Your Body mass index is 20.3 kg/m. If this is out of the aforementioned range listed, please consider follow up with your Primary Care Provider.  If you are age 54 or younger, your body mass index should be between 19-25. Your Body mass index is 20.3 kg/m. If this is out of the aformentioned range listed, please consider follow up with your Primary Care Provider.   ________________________________________________________  The [facility] providers would like to encourage you to use [facility] to communicate with providers for non-urgent requests or questions.  Due to long hold times on the telephone, sending your provider a message by [facility] may be a faster and more efficient way to get a response.  Please allow 48 business hours for a response.  Please remember that this is for non-urgent requests.  _______________________________________________________  Dennis Bast have been scheduled for a colonoscopy. Please follow written instructions given to you at your visit today.  Please pick up your prep supplies at the pharmacy within the next 1-3 days. If you use inhalers (even only as needed), please bring them with you on the day of your procedure.  We have sent the following medications to your pharmacy for you to pick up at your convenience: Linzess you have been given samples as well Diltiazem cream  Continue miralax and metamucil and prunes.  Minimize pain medication if possible.  Please call with any questions or concerns.  Thank you,  Dr. Jackquline Denmark

## 2021-06-24 ENCOUNTER — Telehealth: Payer: Self-pay | Admitting: Internal Medicine

## 2021-06-24 NOTE — Telephone Encounter (Signed)
MD is out of the office today. Will hold until ge return back Monday for APPROVAL...[name]

## 2021-06-24 NOTE — Telephone Encounter (Signed)
1.Medication Requested: testosterone enanthate (DELATESTRYL) 200 MG/ML injection 2. Pharmacy (Name, [place], [facility]): [id number] - [place], [place] Phone:  [phone]  Fax:  [phone]     3. On Med List:y   4. Last Visit with PCP:  5. Next visit date with PCP:  Requesting 12ml vial.   Agent: Please be advised that RX refills may take up to 3 business days. We ask that you follow-up with your pharmacy.

## 2021-06-26 MED ORDER — TESTOSTERONE ENANTHATE 200 MG/ML IM SOLN
200.0000 mg | INTRAMUSCULAR | 4 refills | Status: DC
Start: 2021-06-26 — End: 2021-07-03

## 2021-06-26 NOTE — Telephone Encounter (Signed)
OK. Thx

## 2021-06-27 ENCOUNTER — Other Ambulatory Visit: Payer: Self-pay | Admitting: Internal Medicine

## 2021-06-27 NOTE — Telephone Encounter (Signed)
Duplicate msg already receive from [place]. Waiting on MD to send.Marland KitchenJohny Marsh

## 2021-06-27 NOTE — Telephone Encounter (Signed)
Done. Thx.

## 2021-06-27 NOTE — Telephone Encounter (Signed)
Patient calling in  Refill request for testosterone enanthate (DELATESTRYL) 200 MG/ML injection was sent to wrong pharmacy  Needs to be sent to:  [id number] - [place], [place]  Phone:  [phone] Fax:  [phone]

## 2021-07-01 ENCOUNTER — Other Ambulatory Visit: Payer: Self-pay

## 2021-07-01 ENCOUNTER — Ambulatory Visit (INDEPENDENT_AMBULATORY_CARE_PROVIDER_SITE_OTHER): Payer: Medicare Other

## 2021-07-01 DIAGNOSIS — Z0000 Encounter for general adult medical examination without abnormal findings: Secondary | ICD-10-CM | POA: Diagnosis not present

## 2021-07-01 NOTE — Progress Notes (Signed)
I connected with Troy Marsh today by telephone and verified that I am speaking with the correct person using two identifiers. Location patient: home Location provider: work Persons participating in the virtual visit: patient, provider.   I discussed the limitations, risks, security and privacy concerns of performing an evaluation and management service by telephone and the availability of in person appointments. I also discussed with the patient that there may be a patient responsible charge related to this service. The patient expressed understanding and verbally consented to this telephonic visit.    Interactive audio and video telecommunications were attempted between this provider and patient, however failed, due to patient having technical difficulties OR patient did not have access to video capability.  We continued and completed visit with audio only.  Some vital signs may be absent or patient reported.   Time Spent with patient on telephone encounter: 40 minutes  Subjective:   Troy Marsh is a 70 y.o. male who presents for Medicare Annual/Subsequent preventive examination.  Review of Systems     Cardiac Risk Factors include: advanced age (>51men, >43 women);family history of premature cardiovascular disease;male gender;hypertension     Objective:    There were no vitals filed for this visit. There is no height or weight on file to calculate BMI.  Advanced Directives 07/01/2021 12/02/2020 07/06/2020 05/18/2020 06/11/2018 06/03/2018 05/29/2018  Does Patient Have a Medical Advance Directive? Yes Yes Yes Yes Yes Yes Yes  Type of Advance Directive Living will [place];Living will - [place];Living will [place];Living will - [place];Living will  Does patient want to make changes to medical advance directive? No - Patient declined No - Patient declined - No - Patient declined No - Patient  declined - No - Patient declined  Copy of [place] in Chart? - No - copy requested - No - copy requested No - copy requested - No - copy requested    Current Medications (verified) Outpatient Encounter Medications as of 07/01/2021  Medication Sig   alprostadil (CAVERJECT IMPULSE) 20 MCG injection 20 mcg by Intracavitary route as needed for erectile dysfunction. use no more than 3 times per week   aspirin 81 MG tablet Take 81 mg by mouth daily.   baclofen (LIORESAL) 20 MG tablet Take 1 tablet (20 mg total) by mouth 3 (three) times daily.   BD DISP NEEDLES 21G X 1-1/2" MISC USE AS DIRECTED   celecoxib (CELEBREX) 200 MG capsule TAKE 1 CAPSULE BY MOUTH  TWICE DAILY   cetirizine-pseudoephedrine (ZYRTEC-D) 5-120 MG tablet Take 1 tablet by mouth daily as needed for allergies.    diltiazem 2 % GEL Diltiazem gel 2% apply rectally twice daily for 4 weeks   EDEX 20 MCG injection INJECT (INTRACAVERNOUS)  20MCG AS NEEDED FOR  ERECTILE DYSFUNCTION. USE  NO MORE THAN 3 TIMES PER  WEEK   emtricitabine-rilpivir-tenofovir AF (ODEFSEY) 200-25-25 MG TABS tablet Take 1 tablet by mouth daily.   fluorouracil (EFUDEX) 5 % cream Apply topically 2 (two) times daily as needed.   gabapentin (NEURONTIN) 300 MG capsule Take 1 capsule by mouth 3 (three) times daily.   hydrochlorothiazide (HYDRODIURIL) 50 MG tablet TAKE 1 TABLET BY MOUTH  DAILY   HYDROcodone-acetaminophen (NORCO) 7.5-325 MG tablet Take 1 tablet by mouth every 6 (six) hours as needed for severe pain.   HYDROcodone-acetaminophen (NORCO) 7.5-325 MG tablet Take 1 tablet by mouth every 6 (six) hours as needed for severe pain.  HYDROcodone-acetaminophen (NORCO) 7.5-325 MG tablet Take 1 tablet by mouth every 6 (six) hours as needed for severe pain.   hydrocortisone (ANUSOL-HC) 25 MG suppository UNWRAP AND INSERT 1  SUPPOSITORY RECTALLY TWICE  DAILY   linaclotide (LINZESS) 290 MCG CAPS capsule Take 1 capsule (290 mcg total) by mouth daily before  breakfast.   Melatonin 10 MG TABS Take 10 mg by mouth at bedtime as needed (for sleep).    methocarbamol (ROBAXIN) 500 MG tablet Take 1 tablet by mouth as needed.   mirabegron ER (MYRBETRIQ) 25 MG TB24 tablet Take 25 mg by mouth as needed (2 TIMES A WEEK).   montelukast (SINGULAIR) 10 MG tablet TAKE 1 TABLET BY MOUTH  DAILY   Multiple Vitamin (MULTIVITAMIN WITH MINERALS) TABS tablet Take 1 tablet by mouth daily. COMPLETE MULTIVITAMIN MEN'S 50+   NEEDLE, DISP, 18 G (BD DISP NEEDLES) 18G X 1-1/2" MISC USE AS DIRECTED   polyethylene glycol powder (GLYCOLAX/MIRALAX) powder Take 17 g by mouth daily as needed for moderate constipation.    potassium chloride (KLOR-CON) 10 MEQ tablet TAKE 1 TABLET BY MOUTH  DAILY   RESTASIS 0.05 % ophthalmic emulsion Place 1 drop into both eyes 2 (two) times daily.   tadalafil (CIALIS) 5 MG tablet TAKE 1 TABLET EVERY DAY   testosterone enanthate (DELATESTRYL) 200 MG/ML injection Inject 1 mL (200 mg total) into the muscle every 14 (fourteen) days. For IM use only   atorvastatin (LIPITOR) 40 MG tablet Take 40 mg by mouth at bedtime.    No facility-administered encounter medications on file as of 07/01/2021.    Allergies (verified) Patient has no known allergies.   History: Past Medical History:  Diagnosis Date   Arthritis    BPH (benign prostatic hyperplasia)    [facility]  DR. Elie Goody   [facility]cer [facility])    Skin cancer- basal and squamous   Constipation due to pain medication    HIV disease ([place]) 1985   last CD4 10/02/12 was 277   Hypertension    Hypogonadism male    Past Surgical History:  Procedure Laterality Date   ANTERIOR CERVICAL DECOMP/DISCECTOMY FUSION Bilateral 11/22/2017   Procedure: ANTERIOR CERVICAL DECOMPRESSION FUSION CERVICAL 4-5, CERVICAL 5-6, CERVICAL 6-7 WITH INSTRUMENTATION AND ALLOGRAFT TIME      /   REQUESTED 4 HOURS;  Surgeon: Phylliss Bob, MD;  Location: [place];  Service: Orthopedics;  Laterality: Bilateral;   BACK SURGERY      COLONOSCOPY W/ POLYPECTOMY     EYE SURGERY Bilateral    Lasik, Cataract   HEMORRHOID SURGERY  1973   INGUINAL HERNIA REPAIR Right 06/11/2018   Procedure: LAPAROSCOPIC RIGHT INGUINAL HERNIA WITH MESH;  Surgeon: Ralene Ok, MD;  Location: [place];  Service: General;  Laterality: Right;   INSERTION OF MESH Right 06/11/2018   Procedure: INSERTION OF MESH;  Surgeon: Ralene Ok, MD;  Location: [place];  Service: General;  Laterality: Right;   KNEE ARTHROSCOPY Right 2009   KNEE ARTHROSCOPY Left 2011   neck surgery  1984   POSTERIOR CERVICAL FUSION/FORAMINOTOMY N/A 05/29/2018   Procedure: POSTERIOR CERVICAL DECOMPRESSION FUSION CERVICAL 4 - CERVICAL 7 WITH INSTRUMENTATION AND ALLOGRAFT;  Surgeon: Phylliss Bob, MD;  Location: [place];  Service: Orthopedics;  Laterality: N/A;   Family History  Problem Relation Age of Onset   Coronary artery disease Father    Heart attack Father    Hypertension Father    Hyperlipidemia Father    Cancer Father  prostate   Colitis Maternal Grandmother    Diabetes Neg Hx    Colon cancer Neg Hx    Rectal cancer Neg Hx    Stomach cancer Neg Hx    Social History   Socioeconomic History   Marital status: Married    Spouse name: [name] (partner)   Number of children: Not on file   Years of education: 16   Highest education level: Not on file  Occupational History   Occupation: retired    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 Work - [place] [employer] - retired 2009. Long term monogamous relationship. Lives in his own home.             Social Determinants of Health   Financial Resource Strain: Low Risk    Difficulty of Paying Living Expenses: Not hard at all  Food Insecurity: No Food Insecurity   Worried About Charity fundraiser in the Last Year: Never true   [place] in the Last Year: Never true  Transportation Needs: No Transportation Needs   Lack of Transportation (Medical): No   Lack of Transportation (Non-Medical): No  Physical Activity: Sufficiently Active   Days of Exercise per Week: 7 days   Minutes of Exercise per Session: 30 min  Stress: No Stress Concern Present   Feeling of Stress : Not at all  Social Connections: Socially Integrated   Frequency of Communication with Friends and Family: More than three times a week   Frequency of Social Gatherings with Friends and Family: More than three times a week   Attends Religious Services: More than 4 times per year   Active Member of [employer] or Organizations: Yes   Attends Music therapist: More than 4 times per year   Marital Status: Married    Tobacco Counseling Counseling given: Not Answered   Clinical Intake:  Pre-visit preparation completed: Yes  Pain : No/denies pain     Nutritional Risks: None Diabetes: No  How often do you need to have someone help you when you read instructions, pamphlets, or other written materials from your doctor or pharmacy?: 1 - Never What is the last grade level you completed in school?: 2 years of college courses  Diabetic? no  Interpreter Needed?: No  Information entered by :: Lisette Abu, LPN   Activities of Daily Living In your present state of health, do you have any difficulty performing the following activities: 07/01/2021  Hearing? N  Vision? N  Difficulty concentrating or making decisions? N  Walking  or climbing stairs? N  Dressing or bathing? N  Doing errands, shopping? N  Preparing Food and eating ? N  Using the Toilet? N  In the past six months, have you accidently leaked  urine? N  Do you have problems with loss of bowel control? N  Managing your Medications? N  Managing your Finances? N  Housekeeping or managing your Housekeeping? N  Some recent data might be hidden    Patient Care Team: Plotnikov, Evie Lacks, MD as PCP - General (Internal Medicine) Almedia Balls, MD (Orthopedic Surgery) Debby Freiberg, MD as Referring Physician (Infectious Diseases) Phylliss Bob, MD as Consulting Physician (Orthopedic Surgery) Festus Aloe, MD as Consulting Physician (Urology) Jackquline Denmark, MD as Consulting Physician (Gastroenterology) Alanda Slim Neena Rhymes, MD as Consulting Physician (Ophthalmology)  Indicate any recent Medical Services you may have received from other than [facility] providers in the past year (date may be approximate).     Assessment:   This is a routine wellness examination for [place].  Hearing/Vision screen Hearing Screening - Comments:: Patient denied any hearing difficulty.   No hearing aids.  Vision Screening - Comments:: Patient wears corrective glasses/contacts.  Eye exam done annually by: Dr. Julian Reil.  Dietary issues and exercise activities discussed: Current Exercise Habits: Structured exercise class, Type of exercise: walking;treadmill;stretching;strength training/weights, Time (Minutes): 30, Frequency (Times/Week): 7, Weekly Exercise (Minutes/Week): 210, Intensity: Mild   Goals Addressed               This Visit's Progress     Patient Stated (pt-stated)        My goal is to continue to go to [facility] to help relieve my back pain.  I do weights, cardio and walking trails.      Depression Screen PHQ 2/9 Scores 05/18/2021 05/18/2020 02/10/2020 04/24/2017 10/23/2016 10/27/2015  PHQ - 2 Score 0 0 0 0 0 0  PHQ- 9 Score - - - 0 - -    Fall Risk Fall  Risk  05/18/2021 05/18/2020 02/10/2020 04/24/2017 10/23/2016  Falls in the past year? 0 0 0 Yes No  Number falls in past yr: 0 0 0 1 -  Injury with Fall? 0 0 0 No -  Risk for fall due to : - No Fall Risks - - -  Follow up - Falls evaluation completed - - -    FALL RISK PREVENTION PERTAINING TO THE HOME:  Any stairs in or around the home? Yes ; has a chair lift if needed If so, are there any without handrails? No  Home free of loose throw rugs in walkways, pet beds, electrical cords, etc? Yes  Adequate lighting in your home to reduce risk of falls? Yes   ASSISTIVE DEVICES UTILIZED TO PREVENT FALLS:  Life alert? No  Use of a cane, walker or w/c? No  Grab bars in the bathroom? Yes  Shower chair or bench in shower? Yes  Elevated toilet seat or a handicapped toilet? Yes   TIMED UP AND GO:  Was the test performed? No .  Length of time to ambulate 10 feet: n/a sec.   Gait steady and fast without use of assistive device (per patient)  Cognitive Function: Normal cognitive status assessed by direct observation by this Nurse Health Advisor. No abnormalities found.   MMSE - Mini Mental State Exam 04/24/2017  Orientation to time 5  Orientation to Place 5  Registration 3  Attention/ Calculation 5  Recall 2  Language- name 2 objects 2  Language- repeat 1  Language- follow 3 step command 3  Language- read & follow direction 1  Write a sentence 1  Copy design 1  Total score 29        Immunizations Immunization History  Administered Date(s) Administered   Influenza Split 04/20/2011   Influenza, High Dose Seasonal PF 04/24/2016, 04/24/2017, 04/24/2018, 04/22/2019, 05/12/2021   Influenza,inj,Quad PF,6+ Mos 03/30/2014, 04/12/2015   Influenza-Unspecified 03/21/2013, 04/22/2019, 05/04/2019, 03/31/2020   PFIZER(Purple Top)SARS-COV-2 Vaccination 09/09/2019, 09/30/2019, 03/31/2020, 10/29/2020, 05/12/2021   PPD Test 07/22/2007, 11/02/2008, 09/20/2009, 02/13/2011   Pneumococcal Conjugate-13  09/30/2012   Pneumococcal Polysaccharide-23 12/05/2004, 09/20/2009, 04/24/2016   Td 12/18/2008, 04/12/2009   Tdap 06/25/2006, 12/28/2016   Zoster, Live 07/22/2011, 04/12/2015    TDAP status: Up to date  Flu Vaccine status: Up to date  Pneumococcal vaccine status: Up to date  Covid-19 vaccine status: Completed vaccines  Qualifies for Shingles Vaccine? Yes   Zostavax completed Yes   Shingrix Completed?: No.    Education has been provided regarding the importance of this vaccine. Patient has been advised to call insurance company to determine out of pocket expense if they have not yet received this vaccine. Advised may also receive vaccine at local pharmacy or Health Dept. Verbalized acceptance and understanding.  Screening Tests Health Maintenance  Topic Date Due   Hepatitis C Screening  Never done   Zoster Vaccines- Shingrix (1 of 2) Never done   COVID-19 Vaccine (6 - Booster for Pfizer series) 07/07/2021   TETANUS/TDAP  12/29/2026   COLONOSCOPY (Pts 45-39yrs Insurance coverage will need to be confirmed)  09/19/2028   Pneumonia Vaccine 25+ Years old  Completed   INFLUENZA VACCINE  Completed   HPV VACCINES  Aged Out    Health Maintenance  Health Maintenance Due  Topic Date Due   Hepatitis C Screening  Never done   Zoster Vaccines- Shingrix (1 of 2) Never done    Colorectal cancer screening: Type of screening: Colonoscopy. Completed 09/20/2018. Repeat every 3 years (scheduled with Dr. Jackquline Denmark for 08/15/2021)  Lung Cancer Screening: (Low Dose CT Chest recommended if Age 87-80 years, 30 pack-year currently smoking OR have quit w/in 15years.) does not qualify.   Lung Cancer Screening Referral: no  Additional Screening:  Hepatitis C Screening: does qualify; Completed no  Vision Screening: Recommended annual ophthalmology exams for early detection of glaucoma and other disorders of the eye. Is the patient up to date with their annual eye exam?  Yes  Who is the provider  or what is the name of the office in which the patient attends annual eye exams? Dr. Julian Reil If pt is not established with a provider, would they like to be referred to a provider to establish care? No .   Dental Screening: Recommended annual dental exams for proper oral hygiene  Community Resource Referral / Chronic Care Management: CRR required this visit?  No   CCM required this visit?  No      Plan:     I have personally reviewed and noted the following in the patients chart:   Medical and social history Use of alcohol, tobacco or illicit drugs  Current medications and supplements including opioid prescriptions. Patient is currently taking opioid prescriptions. Information provided to patient regarding non-opioid alternatives. Patient advised to discuss non-opioid treatment plan with their provider. Functional ability and status Nutritional status Physical activity Advanced directives List of other physicians Hospitalizations, surgeries, and ER visits in previous 12 months Vitals Screenings to include cognitive, depression, and falls Referrals and appointments  In addition, I have reviewed and discussed with patient certain preventive protocols, quality metrics, and best practice recommendations. A written personalized care plan for preventive services as well as general preventive health recommendations were provided to  patient.     Sheral Flow, LPN   [id number]   Nurse Notes:  Patient is cogitatively intact. There were no vitals filed for this visit. There is no height or weight on file to calculate BMI. Patient stated that he has no issues with gait or balance; does not use any assistive devices.

## 2021-07-02 ENCOUNTER — Other Ambulatory Visit: Payer: Self-pay | Admitting: Internal Medicine

## 2021-07-03 ENCOUNTER — Other Ambulatory Visit: Payer: Self-pay | Admitting: Internal Medicine

## 2021-07-03 MED ORDER — TESTOSTERONE ENANTHATE 200 MG/ML IM SOLN: 200.0000 mg | INTRAMUSCULAR | 4 refills | Status: DC

## 2021-07-06 ENCOUNTER — Telehealth: Payer: Self-pay

## 2021-07-06 NOTE — Telephone Encounter (Signed)
Rx was refilled 07/03/21. MD sent to [facility].Marland KitchenChryl Heck

## 2021-07-06 NOTE — Telephone Encounter (Signed)
Patient he needs a renewal to be sent for caverject. Please send to [facility]

## 2021-07-12 ENCOUNTER — Other Ambulatory Visit: Payer: Self-pay

## 2021-07-12 ENCOUNTER — Telehealth: Payer: Self-pay | Admitting: Internal Medicine

## 2021-07-12 MED ORDER — CAVERJECT IMPULSE 20 MCG IC KIT: 20.0000 ug | PACK | INTRACAVERNOUS | 3 refills | Status: DC | PRN

## 2021-07-12 NOTE — Telephone Encounter (Signed)
Patient requesting a new rx for a quantity of 18 for 90 days of CAVERJECT IMPULSE 20 MCG injection  Patient states insurance will cover cost   Patient was last seen 05-18-2021  Pharmacy: [facility] ([facility] ) - [place], [place]

## 2021-07-19 ENCOUNTER — Emergency Department (HOSPITAL_BASED_OUTPATIENT_CLINIC_OR_DEPARTMENT_OTHER): Payer: Medicare Other

## 2021-07-19 ENCOUNTER — Emergency Department (HOSPITAL_BASED_OUTPATIENT_CLINIC_OR_DEPARTMENT_OTHER)
Admission: EM | Admit: 2021-07-19 | Discharge: 2021-07-19 | Disposition: A | Discharge status: Home / Self Care | Payer: Medicare Other | Attending: Emergency Medicine | Admitting: Emergency Medicine

## 2021-07-19 ENCOUNTER — Encounter (HOSPITAL_BASED_OUTPATIENT_CLINIC_OR_DEPARTMENT_OTHER): Payer: Self-pay

## 2021-07-19 ENCOUNTER — Other Ambulatory Visit: Payer: Self-pay

## 2021-07-19 DIAGNOSIS — R059 Cough, unspecified: Secondary | ICD-10-CM | POA: Diagnosis present

## 2021-07-19 DIAGNOSIS — Z79899 Other long term (current) drug therapy: Secondary | ICD-10-CM | POA: Insufficient documentation

## 2021-07-19 DIAGNOSIS — Z21 Asymptomatic human immunodeficiency virus [HIV] infection status: Secondary | ICD-10-CM | POA: Insufficient documentation

## 2021-07-19 DIAGNOSIS — Z7982 Long term (current) use of aspirin: Secondary | ICD-10-CM | POA: Insufficient documentation

## 2021-07-19 DIAGNOSIS — I10 Essential (primary) hypertension: Secondary | ICD-10-CM | POA: Diagnosis not present

## 2021-07-19 DIAGNOSIS — U071 COVID-19: Secondary | ICD-10-CM | POA: Insufficient documentation

## 2021-07-19 LAB — RESP PANEL BY RT-PCR (FLU A&B, COVID) ARPGX2
Influenza A by PCR: NEGATIVE
Influenza B by PCR: NEGATIVE
SARS Coronavirus 2 by RT PCR: POSITIVE — AB

## 2021-07-19 MED ORDER — BENZONATATE 100 MG PO CAPS: 100.0000 mg | ORAL_CAPSULE | Freq: Three times a day (TID) | ORAL | 0 refills | Status: DC

## 2021-07-19 NOTE — Discharge Instructions (Addendum)
I have sent a cough medication we discussed to the pharmacy.  Please use the incentive spirometer that you were given in the emergency department today to assure full expansion of your lungs.  Return if you have worsening symptoms, shortness of breath or chest pain.

## 2021-07-19 NOTE — ED Provider Notes (Signed)
[facility] EMERGENCY DEPT Provider Note   CSN: [id number] Arrival date & time: 07/19/21  1048     History  Chief Complaint  Patient presents with   Nasal Congestion   Cough    Troy Marsh is a 71 y.o. male with a past medical history of hypertension and HIV presenting today with a complaint of cough and congestion for the past week.  Patient reports that his husband began to have URI symptoms 2 weeks ago and his began a week later.  Has a history of bronchitis that he gets yearly and he reports that this feels similar.  He has been using Robitussin over-the-counter to help his cough which is helped him.  Denies any nausea, vomiting or diarrhea.  No fever or chills.   Cough Associated symptoms: no chest pain, no chills, no fever, no headaches, no shortness of breath and no sore throat       Home Medications Prior to Admission medications   Medication Sig Start Date End Date Taking? Authorizing Provider  benzonatate (TESSALON) 100 MG capsule Take 1 capsule (100 mg total) by mouth every 8 (eight) hours. 07/19/21  Yes Patrice Moates A, PA-C  alprostadil (CAVERJECT IMPULSE) 20 MCG injection 20 mcg by Intracavitary route as needed for erectile dysfunction. use no more than 3 times per week 07/12/21   Plotnikov, Evie Lacks, MD  aspirin 81 MG tablet Take 81 mg by mouth daily.    [provider]  atorvastatin (LIPITOR) 40 MG tablet Take 40 mg by mouth at bedtime.  11/29/15 12/02/20  [provider]  baclofen (LIORESAL) 20 MG tablet Take 1 tablet (20 mg total) by mouth 3 (three) times daily. 05/13/20   Plotnikov, Evie Lacks, MD  BD DISP NEEDLES 21G X 1-1/2" MISC USE AS DIRECTED 12/23/18   Plotnikov, Evie Lacks, MD  celecoxib (CELEBREX) 200 MG capsule TAKE 1 CAPSULE BY MOUTH  TWICE DAILY 03/24/21   Plotnikov, Evie Lacks, MD  cetirizine-pseudoephedrine (ZYRTEC-D) 5-120 MG tablet Take 1 tablet by mouth daily as needed for allergies.     [provider]   diltiazem 2 % GEL Diltiazem gel 2% apply rectally twice daily for 4 weeks 06/22/21   Jackquline Denmark, MD  emtricitabine-rilpivir-tenofovir AF (ODEFSEY) 200-25-25 MG TABS tablet Take 1 tablet by mouth daily. 04/10/16   [provider]  fluorouracil (EFUDEX) 5 % cream Apply topically 2 (two) times daily as needed. 10/26/20   [provider]  gabapentin (NEURONTIN) 300 MG capsule Take 1 capsule by mouth 3 (three) times daily. 12/01/20   [provider]  hydrochlorothiazide (HYDRODIURIL) 50 MG tablet TAKE 1 TABLET BY MOUTH  DAILY 06/14/21   Plotnikov, Evie Lacks, MD  HYDROcodone-acetaminophen (NORCO) 7.5-325 MG tablet Take 1 tablet by mouth every 6 (six) hours as needed for severe pain. 08/16/20   Plotnikov, Evie Lacks, MD  HYDROcodone-acetaminophen (NORCO) 7.5-325 MG tablet Take 1 tablet by mouth every 6 (six) hours as needed for severe pain. 08/16/20   Plotnikov, Evie Lacks, MD  HYDROcodone-acetaminophen (NORCO) 7.5-325 MG tablet Take 1 tablet by mouth every 6 (six) hours as needed for severe pain. 08/16/20   Plotnikov, Evie Lacks, MD  hydrocortisone (ANUSOL-HC) 25 MG suppository UNWRAP AND INSERT 1  SUPPOSITORY RECTALLY TWICE  DAILY 06/14/21   Jackquline Denmark, MD  linaclotide [facility]) 290 MCG CAPS capsule Take 1 capsule (290 mcg total) by mouth daily before breakfast. 06/22/21   Jackquline Denmark, MD  Melatonin 10 MG TABS Take 10 mg by mouth at  bedtime as needed (for sleep).     [provider]  methocarbamol (ROBAXIN) 500 MG tablet Take 1 tablet by mouth as needed. 07/03/20   [provider]  mirabegron ER (MYRBETRIQ) 25 MG TB24 tablet Take 25 mg by mouth as needed (2 TIMES A WEEK).    [provider]  montelukast (SINGULAIR) 10 MG tablet TAKE 1 TABLET BY MOUTH  DAILY 03/24/21   Plotnikov, Evie Lacks, MD  Multiple Vitamin (MULTIVITAMIN WITH MINERALS) TABS tablet Take 1 tablet by mouth daily. COMPLETE MULTIVITAMIN MEN'S 50+    [provider]  NEEDLE, DISP, 18  G (BD DISP NEEDLES) 18G X 1-1/2" MISC USE AS DIRECTED 03/29/21   Plotnikov, Evie Lacks, MD  polyethylene glycol powder (GLYCOLAX/MIRALAX) powder Take 17 g by mouth daily as needed for moderate constipation.     [provider]  potassium chloride (KLOR-CON) 10 MEQ tablet TAKE 1 TABLET BY MOUTH  DAILY 03/24/21   Plotnikov, Evie Lacks, MD  RESTASIS 0.05 % ophthalmic emulsion Place 1 drop into both eyes 2 (two) times daily. 10/25/15   [provider]  tadalafil (CIALIS) 5 MG tablet TAKE 1 TABLET EVERY DAY 09/20/20   Plotnikov, Evie Lacks, MD  testosterone enanthate (DELATESTRYL) 200 MG/ML injection Inject 1 mL (200 mg total) into the muscle every 14 (fourteen) days. 07/03/21   Plotnikov, Evie Lacks, MD      Allergies    Patient has no known allergies.    Review of Systems   Review of Systems  Constitutional:  Positive for fatigue. Negative for chills and fever.  HENT:  Positive for congestion. Negative for sore throat.   Respiratory:  Positive for cough and chest tightness. Negative for shortness of breath.   Cardiovascular:  Negative for chest pain and palpitations.  Gastrointestinal:  Negative for constipation, diarrhea, nausea and vomiting.  Neurological:  Negative for headaches.   Physical Exam Updated Vital Signs BP (!) 146/91 (BP Location: Right Arm)    Pulse 78    Temp 98.5 F (36.9 C)    Resp 19    Ht 5\' 8"  (1.727 m)    Wt 60.3 kg    SpO2 100%    BMI 20.22 kg/m  Physical Exam Vitals and nursing note reviewed.  Constitutional:      Appearance: Normal appearance.  HENT:     Head: Normocephalic and atraumatic.     Nose: No congestion or rhinorrhea.     Mouth/Throat:     Mouth: Mucous membranes are moist.     Pharynx: Oropharynx is clear.  Eyes:     General: No scleral icterus.    Conjunctiva/sclera: Conjunctivae normal.  Cardiovascular:     Rate and Rhythm: Normal rate and regular rhythm.  Pulmonary:     Effort: Pulmonary effort is normal. No respiratory distress.      Breath sounds: No wheezing or rales.  Skin:    General: Skin is warm and dry.     Findings: No bruising or rash.  Neurological:     Mental Status: He is alert.  Psychiatric:        Mood and Affect: Mood normal.    ED Results / Procedures / Treatments   Labs (all labs ordered are listed, but only abnormal results are displayed) Labs Reviewed  RESP PANEL BY RT-PCR (FLU A&B, COVID) ARPGX2 - Abnormal; Notable for the following components:      Result Value   SARS Coronavirus 2 by RT PCR POSITIVE (*)    All  other components within normal limits    EKG None  Radiology No results found.  Procedures Procedures   Medications Ordered in ED Medications - No data to display  ED Course/ Medical Decision Making/ A&P                           Medical Decision Making  Patient presents to the ED for concern of cough and congestion, this involves an extensive number of treatment options, and is a complaint that carries with it a high risk of complications and morbidity.  The differential diagnosis includes COVID-19, flu, other viral URI, pneumonia, bronchitis, PE, malignancy.   Co morbidities that complicate the patient evaluation  Hypertension and HIV status  Additional history obtained:  External records from outside source obtained and reviewed including normal CD4 counts over the past few visits.   Lab Tests:  I Ordered, and personally interpreted labs.  The pertinent results include: COVID-positive   Imaging Studies ordered:  I ordered imaging studies including chest x-ray I independently visualized and interpreted imaging which showed a left lower lung patchy area, consistent with atelectasis.  Low suspicion for pneumonia due to patient's stable vital signs, afebrile in nature and nonproductive cough.   Test Considered:  EKG and D-dimer.  Low suspicion for ACS or PE, testing deferred.   Dispostion:  After consideration of the diagnostic results and the  patients response to treatment, I feel that the patent would benefit from [facility], and incentive spirometer and return precautions.  No need to treat pneumonia at this time.  Final Clinical Impression(s) / ED Diagnoses Final diagnoses:  [id number]    Rx / DC Orders ED Discharge Orders          Ordered    benzonatate (TESSALON) 100 MG capsule  Every 8 hours        07/19/21 1317           Results and diagnoses were explained to the patient. Return precautions discussed in full. Patient had no additional questions and expressed complete understanding.   This chart was dictated using voice recognition software.  Despite best efforts to proofread,  errors can occur which can change the documentation meaning.     Rhae Hammock, PA-C 07/19/21 [place], Ankit, MD 07/20/21 [phone]

## 2021-07-19 NOTE — ED Triage Notes (Signed)
Pt c/o sinus congestion and cough x 1 week.

## 2021-07-19 NOTE — ED Notes (Signed)
Given instructions on use of incentive spirometer by resp

## 2021-08-15 ENCOUNTER — Other Ambulatory Visit: Payer: Self-pay

## 2021-08-15 ENCOUNTER — Ambulatory Visit (AMBULATORY_SURGERY_CENTER): Payer: Medicare Other | Admitting: Gastroenterology

## 2021-08-15 ENCOUNTER — Encounter: Payer: Self-pay | Admitting: Gastroenterology

## 2021-08-15 VITALS — BP 144/78 | HR 60 | Temp 96.0°F | Resp 15 | Ht 70.0 in | Wt 141.0 lb

## 2021-08-15 DIAGNOSIS — K648 Other hemorrhoids: Secondary | ICD-10-CM

## 2021-08-15 DIAGNOSIS — D124 Benign neoplasm of descending colon: Secondary | ICD-10-CM

## 2021-08-15 DIAGNOSIS — K635 Polyp of colon: Secondary | ICD-10-CM | POA: Diagnosis not present

## 2021-08-15 DIAGNOSIS — K5900 Constipation, unspecified: Secondary | ICD-10-CM

## 2021-08-15 DIAGNOSIS — K602 Anal fissure, unspecified: Secondary | ICD-10-CM

## 2021-08-15 MED ORDER — SODIUM CHLORIDE 0.9 % IV SOLN: 500.0000 mL | Freq: Once | INTRAVENOUS | Status: DC

## 2021-08-15 NOTE — Progress Notes (Signed)
Report given to PACU, vss 

## 2021-08-15 NOTE — Progress Notes (Signed)
Chief Complaint: FU  Referring Provider:  Cassandria Anger, MD      ASSESSMENT AND PLAN;   #1. Anal fissure and internal hemorrhoids.  #2. H/O constipation (uses hydrocodone, Celebrex), neg colon 09/2018 except for fair quality of preparation.  Plan: - Minimize use of pain medications - Continue Metamucil QD with 8 ounces of juice - Continue miralax 17g po qd. - Linzess 290 QD #90, 4 refills - Continue prunes.  - diltiazem cream 2% gel BID x 4 weeks 6 refills - Recall colon Jan 2023 with 2 day prep    HPI:    Troy Marsh is a 71 y.o. male  With HIV, HTN, OA S/P neck Sx ACD  and right inguinal hernia repair on chronic pain medications For follow-up visit  Going to [place] Feb 3-10  Doing very well except for constipation-now having 1 BM/week.  He continues to be on hydrocodone.  Gabapentin has recently been increased.  He does drink plenty of water.  Continues to use Metamucil and MiraLAX.  Wants to try higher dose of Linzess.    Anal pain occasionally on defecation but better.  He would like to get his colonoscopy performed early next year as he had limited preparation during the last colonoscopy.  Had only 1 episode of rectal bleeding which resolved on its own.  Previous colonoscopy March 2020 as below.  Married to [employer] (patient of ours)  Previous GI procedures: Colonoscopy 09/2018 fair prep. - Non-bleeding internal hemorrhoids. - Otherwise grossly normal colonoscopy. - Repeat in 3 years d/t HIV, quality of prep  CT AP 06/2020 No acute abnormality.  No finding to explain rectal bleeding. Prostatomegaly. Aortic Atherosclerosis (ICD10-I70.0).  Past Medical History:  Diagnosis Date   Arthritis    BPH (benign prostatic hyperplasia)    [facility]  DR. Elie Goody   Cancer [facility])    Skin cancer- basal and squamous   Constipation due to pain medication    HIV disease ([place]) 1985   last CD4 10/02/12 was 277   Hyperlipidemia     Hypertension    Hypogonadism male     Past Surgical History:  Procedure Laterality Date   ANTERIOR CERVICAL DECOMP/DISCECTOMY FUSION Bilateral 11/22/2017   Procedure: ANTERIOR CERVICAL DECOMPRESSION FUSION CERVICAL 4-5, CERVICAL 5-6, CERVICAL 6-7 WITH INSTRUMENTATION AND ALLOGRAFT TIME      /   REQUESTED 4 HOURS;  Surgeon: Phylliss Bob, MD;  Location: [facility];  Service: Orthopedics;  Laterality: Bilateral;   BACK SURGERY     COLONOSCOPY W/ POLYPECTOMY     EYE SURGERY Bilateral    Lasik, Cataract   HEMORRHOID SURGERY  1973   INGUINAL HERNIA REPAIR Right 06/11/2018   Procedure: LAPAROSCOPIC RIGHT INGUINAL HERNIA WITH MESH;  Surgeon: Ralene Ok, MD;  Location: [facility];  Service: General;  Laterality: Right;   INSERTION OF MESH Right 06/11/2018   Procedure: INSERTION OF MESH;  Surgeon: Ralene Ok, MD;  Location: [facility];  Service: General;  Laterality: Right;   KNEE ARTHROSCOPY Right 2009   KNEE ARTHROSCOPY Left 2011   neck surgery  1984   POSTERIOR CERVICAL FUSION/FORAMINOTOMY N/A 05/29/2018   Procedure: POSTERIOR CERVICAL DECOMPRESSION FUSION CERVICAL 4 - CERVICAL 7 WITH INSTRUMENTATION AND ALLOGRAFT;  Surgeon: Phylliss Bob, MD;  Location: [facility];  Service: Orthopedics;  Laterality: N/A;    Family History  Problem Relation Age of Onset   Coronary artery disease Father    Heart attack Father    Hypertension Father  Hyperlipidemia Father    Cancer Father        prostate   Colitis Maternal Grandmother    Diabetes Neg Hx    Colon cancer Neg Hx    Rectal cancer Neg Hx    Stomach cancer Neg Hx    Esophageal cancer Neg Hx     Social History   Tobacco Use   Smoking status: Former    Packs/day: 2.00    Years: 35.00    Pack years: 70.00    Types: Cigarettes    Quit date: 02/10/2004    Years since quitting: 17.5   Smokeless tobacco: Never  Vaping Use   Vaping Use: Never used  Substance Use Topics   Alcohol use: No    Comment: quit 2004   Drug use: No    Current  Outpatient Medications  Medication Sig Dispense Refill   alprostadil (CAVERJECT IMPULSE) 20 MCG injection 20 mcg by Intracavitary route as needed for erectile dysfunction. use no more than 3 times per week 18 each 3   aspirin 81 MG tablet Take 81 mg by mouth daily.     baclofen (LIORESAL) 20 MG tablet Take 1 tablet (20 mg total) by mouth 3 (three) times daily. 270 each 3   BD DISP NEEDLES 21G X 1-1/2" MISC USE AS DIRECTED 100 each 0   celecoxib (CELEBREX) 200 MG capsule TAKE 1 CAPSULE BY MOUTH  TWICE DAILY 180 capsule 3   cetirizine-pseudoephedrine (ZYRTEC-D) 5-120 MG tablet Take 1 tablet by mouth daily as needed for allergies.      diltiazem 2 % GEL Diltiazem gel 2% apply rectally twice daily for 4 weeks 30 g 6   emtricitabine-rilpivir-tenofovir AF (ODEFSEY) 200-25-25 MG TABS tablet Take 1 tablet by mouth daily.     fluorouracil (EFUDEX) 5 % cream Apply topically 2 (two) times daily as needed.     gabapentin (NEURONTIN) 300 MG capsule Take 1 capsule by mouth 3 (three) times daily.     hydrochlorothiazide (HYDRODIURIL) 50 MG tablet TAKE 1 TABLET BY MOUTH  DAILY 90 tablet 3   HYDROcodone-acetaminophen (NORCO) 7.5-325 MG tablet Take 1 tablet by mouth every 6 (six) hours as needed for severe pain. 120 tablet 0   hydrocortisone (ANUSOL-HC) 25 MG suppository UNWRAP AND INSERT 1  SUPPOSITORY RECTALLY TWICE  DAILY 180 suppository 0   linaclotide (LINZESS) 290 MCG CAPS capsule Take 1 capsule (290 mcg total) by mouth daily before breakfast. 90 capsule 4   Melatonin 10 MG TABS Take 10 mg by mouth at bedtime as needed (for sleep).      mirabegron ER (MYRBETRIQ) 25 MG TB24 tablet Take 25 mg by mouth as needed (2 TIMES A WEEK).     montelukast (SINGULAIR) 10 MG tablet TAKE 1 TABLET BY MOUTH  DAILY 90 tablet 3   Multiple Vitamin (MULTIVITAMIN WITH MINERALS) TABS tablet Take 1 tablet by mouth daily. COMPLETE MULTIVITAMIN MEN'S 50+     polyethylene glycol powder (GLYCOLAX/MIRALAX) powder Take 17 g by mouth daily  as needed for moderate constipation.      potassium chloride (KLOR-CON) 10 MEQ tablet TAKE 1 TABLET BY MOUTH  DAILY 90 tablet 3   RESTASIS 0.05 % ophthalmic emulsion Place 1 drop into both eyes 2 (two) times daily.     atorvastatin (LIPITOR) 40 MG tablet Take 40 mg by mouth at bedtime.      benzonatate (TESSALON) 100 MG capsule Take 1 capsule (100 mg total) by mouth every 8 (eight) hours. 21 capsule 0  methocarbamol (ROBAXIN) 500 MG tablet Take 1 tablet by mouth as needed.     NEEDLE, DISP, 18 G (BD DISP NEEDLES) 18G X 1-1/2" MISC USE AS DIRECTED 50 each 3   tadalafil (CIALIS) 5 MG tablet TAKE 1 TABLET EVERY DAY 90 tablet 3   testosterone enanthate (DELATESTRYL) 200 MG/ML injection Inject 1 mL (200 mg total) into the muscle every 14 (fourteen) days. (Patient not taking: Reported on 08/15/2021) 5 mL 4   Current Facility-Administered Medications  Medication Dose Route Frequency Provider Last Rate Last Admin   0.9 %  sodium chloride infusion  500 mL Intravenous Once Jackquline Denmark, MD        No Known Allergies  Review of Systems:  Negative except for HPI.    Physical Exam:    BP 134/90    Pulse 70    Temp (!) 96 F (35.6 C) (Temporal)    Ht 5\' 10"  (1.778 m)    Wt 141 lb (64 kg)    SpO2 100%    BMI 20.23 kg/m  Filed Weights   08/15/21 0914  Weight: 141 lb (64 kg)   Constitutional:  Well-developed, in no acute distress. Psychiatric: Normal mood and affect. Behavior is normal. HEENT: Pupils normal.  Conjunctivae are normal. No scleral icterus. Cardiovascular: Normal rate, regular rhythm. No edema Pulmonary/chest: Effort normal and breath sounds normal. No wheezing, rales or rhonchi. Abdominal: Soft, nondistended. Nontender. Bowel sounds active throughout. There are no masses palpable. No hepatomegaly. Rectal: To be performed the time of colonoscopy Neurological: Alert and oriented to person place and time. Skin: Skin is warm and dry. No rashes noted.  Data Reviewed: I have personally  reviewed following labs and imaging studies  CBC: CBC Latest Ref Rng & Units 11/30/2020 07/06/2020 05/13/2020  WBC 4.0 - 10.5 K/uL 5.9 4.1 4.2  Hemoglobin 13.0 - 17.0 g/dL 16.6 16.1 16.7  Hematocrit 39.0 - 52.0 % 47.1 45.1 48.0  Platelets 150 - 400 K/uL 141(L) 141(L) 151.0    CMP: CMP Latest Ref Rng & Units 07/06/2020 05/13/2020 05/13/2020  Glucose 70 - 99 mg/dL 73 63(L) -  BUN 8 - 23 mg/dL 9 11 -  Creatinine 0.61 - 1.24 mg/dL 1.06 1.21 -  Sodium 135 - 145 mmol/L 131(L) 137 -  Potassium 3.5 - 5.1 mmol/L 3.5 4.0 -  Chloride 98 - 111 mmol/L 93(L) 96 -  CO2 22 - 32 mmol/L 31 34(H) -  Calcium 8.9 - 10.3 mg/dL 9.0 9.1 -  Total Protein 6.5 - 8.1 g/dL 6.0(L) 5.9(L) 5.9(L)  Total Bilirubin 0.3 - 1.2 mg/dL 0.8 1.0 1.0  Alkaline Phos 38 - 126 U/L 46 52 52  AST 15 - 41 U/L 24 23 23   ALT 0 - 44 U/L 16 17 17   I spent 25 minutes of face-to-face time with the patient. Greater than 50% of the time was spent counseling and coordinating care.     Carmell Austria, MD 08/15/2021, 9:47 AM  Cc: Plotnikov, Evie Lacks, MD

## 2021-08-15 NOTE — Op Note (Signed)
[facility] Patient Name: Troy Marsh Procedure Date: 08/15/2021 9:48 AM MRN: [id number] Endoscopist: Jackquline Denmark , MD Age: 71 Referring MD:  Date of Birth: [birth year] Gender: Male Account #: [id number] Procedure:                Colonoscopy Indications:              Chronic constipation. H/O anal fissure Medicines:                Monitored Anesthesia Care Procedure:                Pre-Anesthesia Assessment:                           - Prior to the procedure, a History and Physical                            was performed, and patient medications and                            allergies were reviewed. The patient's tolerance of                            previous anesthesia was also reviewed. The risks                            and benefits of the procedure and the sedation                            options and risks were discussed with the patient.                            All questions were answered, and informed consent                            was obtained. Prior Anticoagulants: The patient has                            taken no previous anticoagulant or antiplatelet                            agents. ASA Grade Assessment: II - A patient with                            mild systemic disease. After reviewing the risks                            and benefits, the patient was deemed in                            satisfactory condition to undergo the procedure.                           After obtaining informed consent, the colonoscope  was passed under direct vision. Throughout the                            procedure, the patient's blood pressure, pulse, and                            oxygen saturations were monitored continuously. The                            Olympus CF-HQ190L [id number]) Colonoscope was                            introduced through the anus and advanced to the 2                            cm into the ileum. The  colonoscopy was performed                            without difficulty. The patient tolerated the                            procedure well. The quality of the bowel                            preparation was good. The terminal ileum, ileocecal                            valve, appendiceal orifice, and rectum were                            photographed. Scope In: 9:57:40 AM Scope Out: 10:10:25 AM Scope Withdrawal Time: 0 hours 8 minutes 55 seconds  Total Procedure Duration: 0 hours 12 minutes 45 seconds  Findings:                 A 6 mm polyp was found in the mid descending colon.                            The polyp was sessile. The polyp was removed with a                            cold snare. Resection and retrieval were complete.                           A few rare small-mouthed diverticula were found in                            the sigmoid colon.                           Non-bleeding internal hemorrhoids were found during                            retroflexion and during perianal exam. The  hemorrhoids were moderate and Grade I (internal                            hemorrhoids that do not prolapse).                           The terminal ileum appeared normal.                           The exam was otherwise without abnormality on                            direct and retroflexion views.                           A small posterior anal fissure was found on                            perianal exam. Also noted was an incidental small ?                            6 mm Prostate nodule. Complications:            No immediate complications. Estimated Blood Loss:     Estimated blood loss: none. Estimated blood loss:                            none. Impression:               - One 6 mm polyp in the mid descending colon,                            removed with a cold snare. Resected and retrieved.                           - Minimal sigmoid diverticulosis                            - Non-bleeding internal hemorrhoids.                           - Chronic posterior anal fissure                           - The examined portion of the ileum was normal.                           - The examination was otherwise normal on direct                            and retroflexion views. Recommendation:           - Patient has a contact number available for                            emergencies. The signs and symptoms of potential  delayed complications were discussed with the                            patient. Return to normal activities tomorrow.                            Written discharge instructions were provided to the                            patient.                           - Resume previous diet.                           - Continue present medications.                           - Await pathology results.                           - Continue using Preparation H 1 twice daily after                            the bowel movement for 7 to 10 days.                           - Repeat colonoscopy for surveillance based on                            pathology results.                           - The findings and recommendations were discussed                            with the patient's family. They would make FU                            appointment with Dr. Linton Ham (urology) for ?                            Prostate nodule. Jackquline Denmark, MD 08/15/2021 10:18:49 AM This report has been signed electronically.

## 2021-08-15 NOTE — Patient Instructions (Signed)
Thank you for letting us take care of your healthcare needs today. Please see handouts given to you on Polyps, Diverticulosis and Hemorrhoids. Continue to use Preparation H 1 application twice daily after bowel movement for 7-10 days. Make follow up appt with your Urologist.    YOU HAD AN ENDOSCOPIC PROCEDURE TODAY AT [facility]:   Refer to the procedure report that was given to you for any specific questions about what was found during the examination.  If the procedure report does not answer your questions, please call your gastroenterologist to clarify.  If you requested that your care partner not be given the details of your procedure findings, then the procedure report has been included in a sealed envelope for you to review at your convenience later.  YOU SHOULD EXPECT: Some feelings of bloating in the abdomen. Passage of more gas than usual.  Walking can help get rid of the air that was put into your GI tract during the procedure and reduce the bloating. If you had a lower endoscopy (such as a colonoscopy or flexible sigmoidoscopy) you may notice spotting of blood in your stool or on the toilet paper. If you underwent a bowel prep for your procedure, you may not have a normal bowel movement for a few days.  Please Note:  You might notice some irritation and congestion in your nose or some drainage.  This is from the oxygen used during your procedure.  There is no need for concern and it should clear up in a day or so.  SYMPTOMS TO REPORT IMMEDIATELY:  Following lower endoscopy (colonoscopy or flexible sigmoidoscopy):  Excessive amounts of blood in the stool  Significant tenderness or worsening of abdominal pains  Swelling of the abdomen that is new, acute  Fever of 100F or higher   For urgent or emergent issues, a gastroenterologist can be reached at any hour by calling [phone]. Do not use MyChart messaging for urgent concerns.    DIET:  We do recommend a  small meal at first, but then you may proceed to your regular diet.  Drink plenty of fluids but you should avoid alcoholic beverages for 24 hours.  ACTIVITY:  You should plan to take it easy for the rest of today and you should NOT DRIVE or use heavy machinery until tomorrow (because of the sedation medicines used during the test).    FOLLOW UP: Our staff will call the number listed on your records 48-72 hours following your procedure to check on you and address any questions or concerns that you may have regarding the information given to you following your procedure. If we do not reach you, we will leave a message.  We will attempt to reach you two times.  During this call, we will ask if you have developed any symptoms of COVID 19. If you develop any symptoms (ie: fever, flu-like symptoms, shortness of breath, cough etc.) before then, please call [phone].  If you test positive for Covid 19 in the 2 weeks post procedure, please call and report this information to [place].    If any biopsies were taken you will be contacted by phone or by letter within the next 1-3 weeks.  Please call us at [phone] if you have not heard about the biopsies in 3 weeks.    SIGNATURES/CONFIDENTIALITY: You and/or your care partner have signed paperwork which will be entered into your electronic medical record.  These signatures attest to the fact that that the  information above on your After Visit Summary has been reviewed and is understood.  Full responsibility of the confidentiality of this discharge information lies with you and/or your care-partner.

## 2021-08-15 NOTE — Progress Notes (Signed)
Called to room to assist during endoscopic procedure.  Patient ID and intended procedure confirmed with present staff. Received instructions for my participation in the procedure from the performing physician.  

## 2021-08-17 ENCOUNTER — Telehealth: Payer: Self-pay

## 2021-08-17 NOTE — Telephone Encounter (Signed)
°  Follow up Call-  Call back number 08/15/2021  Post procedure Call Back phone  # [phone]  Permission to leave phone message Yes  Some recent data might be hidden     Patient questions:  Do you have a fever, pain , or abdominal swelling? No. Pain Score  0 *  Have you tolerated food without any problems? Yes.    Have you been able to return to your normal activities? Yes.    Do you have any questions about your discharge instructions: Diet   No. Medications  No. Follow up visit  No.  Do you have questions or concerns about your Care? No.  Actions: * If pain score is 4 or above: No action needed, pain <4.

## 2021-08-21 ENCOUNTER — Encounter: Payer: Self-pay | Admitting: Gastroenterology

## 2021-09-07 ENCOUNTER — Telehealth: Payer: Self-pay | Admitting: Gastroenterology

## 2021-09-07 NOTE — Telephone Encounter (Signed)
Inbound call from pt requesting a call back stating that he is only receiving about 154 of his Hydrocortisone and he should be receiving about 180. Please advise. Thank you.

## 2021-09-08 NOTE — Telephone Encounter (Signed)
LVM on both number. Mychart message sent  Spoke with Otila Kluver from [facility] said that shipment was sent out on 1-12 and they did 15 boxes with 12 in each box. Patient needs to call [phone] account number is [id number] and he needs to explain that he didn't receive his full amount of suppositories

## 2021-10-06 ENCOUNTER — Other Ambulatory Visit: Payer: Self-pay | Admitting: Internal Medicine

## 2021-11-16 ENCOUNTER — Ambulatory Visit (INDEPENDENT_AMBULATORY_CARE_PROVIDER_SITE_OTHER): Payer: Medicare Other | Admitting: Internal Medicine

## 2021-11-16 ENCOUNTER — Encounter: Payer: Self-pay | Admitting: Internal Medicine

## 2021-11-16 DIAGNOSIS — G4700 Insomnia, unspecified: Secondary | ICD-10-CM | POA: Insufficient documentation

## 2021-11-16 DIAGNOSIS — Z21 Asymptomatic human immunodeficiency virus [HIV] infection status: Secondary | ICD-10-CM | POA: Diagnosis not present

## 2021-11-16 DIAGNOSIS — M545 Low back pain, unspecified: Secondary | ICD-10-CM

## 2021-11-16 DIAGNOSIS — I10 Essential (primary) hypertension: Secondary | ICD-10-CM

## 2021-11-16 DIAGNOSIS — I700 Atherosclerosis of aorta: Secondary | ICD-10-CM | POA: Diagnosis not present

## 2021-11-16 DIAGNOSIS — G8929 Other chronic pain: Secondary | ICD-10-CM

## 2021-11-16 DIAGNOSIS — R634 Abnormal weight loss: Secondary | ICD-10-CM

## 2021-11-16 DIAGNOSIS — F5101 Primary insomnia: Secondary | ICD-10-CM

## 2021-11-16 DIAGNOSIS — E291 Testicular hypofunction: Secondary | ICD-10-CM

## 2021-11-16 NOTE — Assessment & Plan Note (Signed)
Continues to do well. Will be following up with Dr. Allen Norris ?

## 2021-11-16 NOTE — Assessment & Plan Note (Signed)
Testosterone replacment q 2 weeks IM and using caverject ? Potential benefits of a long term testosterone use as well as potential risks  and complications were explained to the patient and were aknowledged. ?Testosterone levels are low however it is working clinically - no dose change ?

## 2021-11-16 NOTE — Assessment & Plan Note (Signed)
Wt Readings from Last 3 Encounters:  ?11/16/21 137 lb 6.4 oz (62.3 kg)  ?08/15/21 141 lb (64 kg)  ?07/19/21 133 lb (60.3 kg)  ? ? ?

## 2021-11-16 NOTE — Assessment & Plan Note (Signed)
NAS diet ?HCTZ ?Kdur ?

## 2021-11-16 NOTE — Assessment & Plan Note (Signed)
Worse ?Will start Belsomra 15 mg/d. He used it before ?

## 2021-11-16 NOTE — Assessment & Plan Note (Addendum)
Dr Ron Agee ?Cont on Norco prn, Gabapentin, Methacarbamol ? Potential benefits of a long term opioids use as well as potential risks (i.e. addiction risk, apnea etc) and complications (i.e. Somnolence, constipation and others) were explained to the patient and were aknowledged. ?Blue-Emu cream was recommended to use 2-3 times a day ? ?

## 2021-11-16 NOTE — Assessment & Plan Note (Signed)
On Lipitor 

## 2021-11-16 NOTE — Patient Instructions (Signed)
Blue-Emu cream -- use 2-3 times a day ? ?

## 2021-12-02 NOTE — Progress Notes (Signed)
Subjective:  Patient ID: Troy Marsh, male    DOB: [birth year]  Age: 71 y.o. MRN: [id number]  CC: Follow-up (6 month f/u)   HPI HIREN PEPLINSKI presents for dyslipidemia, OA, LBP f/u  Outpatient Medications Prior to Visit  Medication Sig Dispense Refill   alprostadil (CAVERJECT IMPULSE) 20 MCG injection 20 mcg by Intracavitary route as needed for erectile dysfunction. use no more than 3 times per week 18 each 3   aspirin 81 MG tablet Take 81 mg by mouth daily.     baclofen (LIORESAL) 20 MG tablet Take 1 tablet (20 mg total) by mouth 3 (three) times daily. 270 each 3   BD DISP NEEDLES 21G X 1-1/2" MISC USE AS DIRECTED 100 each 0   benzonatate (TESSALON) 100 MG capsule Take 1 capsule (100 mg total) by mouth every 8 (eight) hours. 21 capsule 0   celecoxib (CELEBREX) 200 MG capsule TAKE 1 CAPSULE BY MOUTH  TWICE DAILY 180 capsule 3   cetirizine-pseudoephedrine (ZYRTEC-D) 5-120 MG tablet Take 1 tablet by mouth daily as needed for allergies.      diltiazem 2 % GEL Diltiazem gel 2% apply rectally twice daily for 4 weeks 30 g 6   emtricitabine-rilpivir-tenofovir AF (ODEFSEY) 200-25-25 MG TABS tablet Take 1 tablet by mouth daily.     fluorouracil (EFUDEX) 5 % cream Apply topically 2 (two) times daily as needed.     gabapentin (NEURONTIN) 300 MG capsule Take 1 capsule by mouth 3 (three) times daily.     hydrochlorothiazide (HYDRODIURIL) 50 MG tablet TAKE 1 TABLET BY MOUTH  DAILY 90 tablet 3   HYDROcodone-acetaminophen (NORCO) 7.5-325 MG tablet Take 1 tablet by mouth every 6 (six) hours as needed for severe pain. 120 tablet 0   hydrocortisone (ANUSOL-HC) 25 MG suppository UNWRAP AND INSERT 1  SUPPOSITORY RECTALLY TWICE  DAILY 180 suppository 0   linaclotide (LINZESS) 290 MCG CAPS capsule Take 1 capsule (290 mcg total) by mouth daily before breakfast. 90 capsule 4   Melatonin 10 MG TABS Take 10 mg by mouth at bedtime as needed (for sleep).      methocarbamol (ROBAXIN) 500 MG tablet Take 1  tablet by mouth as needed.     mirabegron ER (MYRBETRIQ) 25 MG TB24 tablet Take 25 mg by mouth as needed (2 TIMES A WEEK).     montelukast (SINGULAIR) 10 MG tablet TAKE 1 TABLET BY MOUTH  DAILY 90 tablet 3   Multiple Vitamin (MULTIVITAMIN WITH MINERALS) TABS tablet Take 1 tablet by mouth daily. COMPLETE MULTIVITAMIN MEN'S 50+     NEEDLE, DISP, 18 G (B-D BLUNT FILL NEEDLE) 18G X 1-1/2" MISC USE AS INSTRUCTED 6 each 0   polyethylene glycol powder (GLYCOLAX/MIRALAX) powder Take 17 g by mouth daily as needed for moderate constipation.      potassium chloride (KLOR-CON) 10 MEQ tablet TAKE 1 TABLET BY MOUTH  DAILY 90 tablet 3   RESTASIS 0.05 % ophthalmic emulsion Place 1 drop into both eyes 2 (two) times daily.     tadalafil (CIALIS) 5 MG tablet TAKE 1 TABLET EVERY DAY 90 tablet 3   testosterone enanthate (DELATESTRYL) 200 MG/ML injection Inject 1 mL (200 mg total) into the muscle every 14 (fourteen) days. 5 mL 4   atorvastatin (LIPITOR) 40 MG tablet Take 40 mg by mouth at bedtime.      No facility-administered medications prior to visit.    ROS: Review of Systems  Constitutional:  Negative for appetite change, fatigue and unexpected weight  change.  HENT:  Negative for congestion, nosebleeds, sneezing, sore throat and trouble swallowing.   Eyes:  Negative for itching and visual disturbance.  Respiratory:  Negative for cough.   Cardiovascular:  Negative for chest pain, palpitations and leg swelling.  Gastrointestinal:  Negative for abdominal distention, blood in stool, diarrhea and nausea.  Genitourinary:  Negative for frequency and hematuria.  Musculoskeletal:  Positive for back pain. Negative for gait problem, joint swelling and neck pain.  Skin:  Positive for color change. Negative for rash.  Neurological:  Negative for dizziness, tremors, speech difficulty and weakness.  Psychiatric/Behavioral:  Negative for agitation, dysphoric mood and sleep disturbance. The patient is not nervous/anxious.     Objective:  BP 118/72 (BP Location: Left Arm)   Pulse 71   Temp 98 F (36.7 C) (Oral)   Ht '5\' 10"'$  (1.778 m)   Wt 137 lb 6.4 oz (62.3 kg)   SpO2 97%   BMI 19.71 kg/m   BP Readings from Last 3 Encounters:  11/16/21 118/72  08/15/21 (!) 144/78  07/19/21 (!) 146/91    Wt Readings from Last 3 Encounters:  11/16/21 137 lb 6.4 oz (62.3 kg)  08/15/21 141 lb (64 kg)  07/19/21 133 lb (60.3 kg)    Physical Exam Constitutional:      General: He is not in acute distress.    Appearance: Normal appearance. He is well-developed.     Comments: NAD  Eyes:     Conjunctiva/sclera: Conjunctivae normal.     Pupils: Pupils are equal, round, and reactive to light.  Neck:     Thyroid: No thyromegaly.     Vascular: No JVD.  Cardiovascular:     Rate and Rhythm: Normal rate and regular rhythm.     Heart sounds: Normal heart sounds. No murmur heard.   No friction rub. No gallop.  Pulmonary:     Effort: Pulmonary effort is normal. No respiratory distress.     Breath sounds: Normal breath sounds. No wheezing or rales.  Chest:     Chest wall: No tenderness.  Abdominal:     General: Bowel sounds are normal. There is no distension.     Palpations: Abdomen is soft. There is no mass.     Tenderness: There is no abdominal tenderness. There is no guarding or rebound.  Musculoskeletal:        General: Tenderness present. Normal range of motion.     Cervical back: Normal range of motion.  Lymphadenopathy:     Cervical: No cervical adenopathy.  Skin:    General: Skin is warm and dry.     Findings: No rash.  Neurological:     Mental Status: He is alert and oriented to person, place, and time.     Cranial Nerves: No cranial nerve deficit.     Motor: No abnormal muscle tone.     Coordination: Coordination normal.     Gait: Gait normal.     Deep Tendon Reflexes: Reflexes are normal and symmetric.  Psychiatric:        Behavior: Behavior normal.        Thought Content: Thought content normal.         Judgment: Judgment normal.  LS w/pain  Lab Results  Component Value Date   WBC 5.9 11/30/2020   HGB 16.6 11/30/2020   HCT 47.1 11/30/2020   PLT 141 (L) 11/30/2020   GLUCOSE 73 07/06/2020   CHOL 131 05/13/2020   TRIG 52.0 05/13/2020   HDL 71.90 05/13/2020  LDLDIRECT 113.3 05/21/2006   LDLCALC 49 05/13/2020   ALT 16 07/06/2020   AST 24 07/06/2020   NA 131 (L) 07/06/2020   K 3.5 07/06/2020   CL 93 (L) 07/06/2020   CREATININE 1.06 07/06/2020   BUN 9 07/06/2020   CO2 31 07/06/2020   TSH 2.69 05/18/2021   PSA 1.18 05/13/2020   INR 1.0 07/06/2020    DG Chest Portable 1 View  Result Date: 07/19/2021 CLINICAL DATA:  Cough, congestion EXAM: PORTABLE CHEST 1 VIEW COMPARISON:  2019 FINDINGS: Hyperexpanded lungs. Minimal patchy density at the left lung base. No pleural effusion. Normal heart size. IMPRESSION: Minimal patchy density at the left lung base may reflect atelectasis/scarring or less likely early changes of pneumonia. Electronically Signed   By: Macy Mis M.D.   On: 07/19/2021 13:35    Assessment & Plan:   Problem List Items Addressed This Visit     Hypogonadism male    Testosterone replacment q 2 weeks IM and using caverject  Potential benefits of a long term testosterone use as well as potential risks  and complications were explained to the patient and were aknowledged. Testosterone levels are low however it is working clinically - no dose change       Relevant Orders   CBC with Differential/Platelet   Comprehensive metabolic panel   Testosterone   TSH   CK   Lipid panel   HTN (hypertension)    NAS diet HCTZ Kdur       HIV (human immunodeficiency virus infection) ([place])    Continues to do well. Will be following up with Dr. Allen Norris      Relevant Orders   CBC with Differential/Platelet   Comprehensive metabolic panel   Testosterone   TSH   CK   Lipid panel   Low back pain    Dr Ron Agee Cont on Norco prn, Gabapentin, Methacarbamol  Potential  benefits of a long term opioids use as well as potential risks (i.e. addiction risk, apnea etc) and complications (i.e. Somnolence, constipation and others) were explained to the patient and were aknowledged. Blue-Emu cream was recommended to use 2-3 times a day       Relevant Orders   CBC with Differential/Platelet   Comprehensive metabolic panel   Testosterone   TSH   CK   Lipid panel   Loss of weight    Wt Readings from Last 3 Encounters:  11/16/21 137 lb 6.4 oz (62.3 kg)  08/15/21 141 lb (64 kg)  07/19/21 133 lb (60.3 kg)        Atherosclerosis of aorta (HCC)    On Lipitor      Insomnia    Worse Will start Belsomra 15 mg/d. He used it before          No orders of the defined types were placed in this encounter.     Follow-up: Return in about 6 months (around 05/19/2022) for Wellness Exam.  [name], MD

## 2021-12-05 ENCOUNTER — Other Ambulatory Visit (INDEPENDENT_AMBULATORY_CARE_PROVIDER_SITE_OTHER): Payer: Medicare Other

## 2021-12-05 ENCOUNTER — Encounter: Payer: Self-pay | Admitting: Internal Medicine

## 2021-12-05 DIAGNOSIS — G8929 Other chronic pain: Secondary | ICD-10-CM | POA: Diagnosis not present

## 2021-12-05 DIAGNOSIS — Z21 Asymptomatic human immunodeficiency virus [HIV] infection status: Secondary | ICD-10-CM

## 2021-12-05 DIAGNOSIS — E291 Testicular hypofunction: Secondary | ICD-10-CM

## 2021-12-05 DIAGNOSIS — M545 Low back pain, unspecified: Secondary | ICD-10-CM

## 2021-12-05 LAB — COMPREHENSIVE METABOLIC PANEL
ALT: 18 U/L (ref 0–53)
AST: 25 U/L (ref 0–37)
Albumin: 4 g/dL (ref 3.5–5.2)
Alkaline Phosphatase: 47 U/L (ref 39–117)
BUN: 10 mg/dL (ref 6–23)
CO2: 35 mEq/L — ABNORMAL HIGH (ref 19–32)
Calcium: 9 mg/dL (ref 8.4–10.5)
Chloride: 96 mEq/L (ref 96–112)
Creatinine, Ser: 1.24 mg/dL (ref 0.40–1.50)
GFR: 58.36 mL/min — ABNORMAL LOW (ref >60.00)
Glucose, Bld: 95 mg/dL (ref 70–99)
Potassium: 3.6 mEq/L (ref 3.5–5.1)
Sodium: 135 mEq/L (ref 135–145)
Total Bilirubin: 1 mg/dL (ref 0.2–1.2)
Total Protein: 5.9 g/dL — ABNORMAL LOW (ref 6.0–8.3)

## 2021-12-05 LAB — CBC WITH DIFFERENTIAL/PLATELET
Basophils Absolute: 0 10*3/uL (ref 0.0–0.1)
Basophils Relative: 0.6 % (ref 0.0–3.0)
Eosinophils Absolute: 0 10*3/uL (ref 0.0–0.7)
Eosinophils Relative: 0.7 % (ref 0.0–5.0)
HCT: 47.7 % (ref 39.0–52.0)
Hemoglobin: 16.6 g/dL (ref 13.0–17.0)
Lymphocytes Relative: 19.3 % (ref 12.0–46.0)
Lymphs Abs: 0.9 10*3/uL (ref 0.7–4.0)
MCHC: 34.9 g/dL (ref 30.0–36.0)
MCV: 96 fl (ref 78.0–100.0)
Monocytes Absolute: 0.4 10*3/uL (ref 0.1–1.0)
Monocytes Relative: 8.6 % (ref 3.0–12.0)
Neutro Abs: 3.3 10*3/uL (ref 1.4–7.7)
Neutrophils Relative %: 70.8 % (ref 43.0–77.0)
Platelets: 139 10*3/uL — ABNORMAL LOW (ref 150.0–400.0)
RBC: 4.97 Mil/uL (ref 4.22–5.81)
RDW: 13.9 % (ref 11.5–15.5)
WBC: 4.7 10*3/uL (ref 4.0–10.5)

## 2021-12-05 LAB — LIPID PANEL
Cholesterol: 130 mg/dL (ref 0–200)
HDL: 69.9 mg/dL (ref >39.00)
LDL Cholesterol: 48 mg/dL (ref 0–99)
NonHDL: 59.82
Total CHOL/HDL Ratio: 2
Triglycerides: 57 mg/dL (ref 0.0–149.0)
VLDL: 11.4 mg/dL (ref 0.0–40.0)

## 2021-12-05 LAB — TSH: TSH: 3.56 u[IU]/mL (ref 0.35–5.50)

## 2021-12-05 LAB — TESTOSTERONE: Testosterone: 680.01 ng/dL (ref 300.00–890.00)

## 2021-12-05 LAB — CK: Total CK: 133 U/L (ref 7–232)

## 2021-12-09 ENCOUNTER — Telehealth: Payer: Self-pay | Admitting: Internal Medicine

## 2021-12-09 NOTE — Telephone Encounter (Signed)
Patient would like to know if his Belsomra has been sent to [facility]  -

## 2021-12-14 MED ORDER — BELSOMRA 10 MG PO TABS: 10.0000 mg | ORAL_TABLET | Freq: Every evening | ORAL | 1 refills | Status: DC | PRN

## 2021-12-14 NOTE — Telephone Encounter (Signed)
It was done.  Thanks °

## 2021-12-31 ENCOUNTER — Encounter: Payer: Self-pay | Admitting: Internal Medicine

## 2022-01-04 MED ORDER — TADALAFIL 5 MG PO TABS: ORAL_TABLET | ORAL | 1 refills | Status: DC

## 2022-01-11 ENCOUNTER — Other Ambulatory Visit: Payer: Self-pay | Admitting: Orthopedic Surgery

## 2022-01-11 DIAGNOSIS — M545 Low back pain, unspecified: Secondary | ICD-10-CM

## 2022-01-13 ENCOUNTER — Telehealth: Payer: Self-pay

## 2022-01-13 NOTE — Telephone Encounter (Signed)
Pt calling in requesting refill on: HYDROcodone-acetaminophen (NORCO) 7.5-325 MG tablet  Pharmacy: [facility], [place] 11/16/21 ROV 05/22/22  Pt is requesting a 3 mo supply if possible

## 2022-01-13 NOTE — Telephone Encounter (Signed)
I don't see that PCP has prescribed since April 2022 so will need to wait for PCP return. If another prescribed is currently prescribing this for him he should contact that provider.

## 2022-01-19 ENCOUNTER — Telehealth: Payer: Self-pay | Admitting: Internal Medicine

## 2022-01-19 ENCOUNTER — Other Ambulatory Visit: Payer: Self-pay

## 2022-01-19 MED ORDER — TESTOSTERONE ENANTHATE 200 MG/ML IM SOLN: 200.0000 mg | INTRAMUSCULAR | 4 refills | Status: DC

## 2022-01-19 NOTE — Telephone Encounter (Signed)
Caller & Relationship to patient:Pharmacy   Call back [phone]   Date of last office visit:11/16/2021   Date of next office visit:   Medication(s) to be refilled:  testosterone enanthate (DELATESTRYL) 200 MG/ML injection       Preferred Pharmacy:  [facility], [place] Phone:  [phone]  Fax:  [phone]

## 2022-01-22 ENCOUNTER — Ambulatory Visit
Admission: RE | Admit: 2022-01-22 | Discharge: 2022-01-22 | Disposition: A | Discharge status: Home / Self Care | Payer: Medicare Other | Source: Ambulatory Visit | Attending: Orthopedic Surgery | Admitting: Orthopedic Surgery

## 2022-01-22 DIAGNOSIS — M545 Low back pain, unspecified: Secondary | ICD-10-CM

## 2022-04-01 ENCOUNTER — Other Ambulatory Visit: Payer: Self-pay | Admitting: Internal Medicine

## 2022-05-08 ENCOUNTER — Other Ambulatory Visit: Payer: Self-pay | Admitting: Gastroenterology

## 2022-05-10 ENCOUNTER — Other Ambulatory Visit: Payer: Self-pay | Admitting: Internal Medicine

## 2022-05-12 ENCOUNTER — Telehealth: Payer: Self-pay | Admitting: Internal Medicine

## 2022-05-12 NOTE — Telephone Encounter (Signed)
Patient needs his testosterone call in to [facility] - mail order - next visit 05/22/2022

## 2022-05-14 ENCOUNTER — Other Ambulatory Visit: Payer: Self-pay | Admitting: Gastroenterology

## 2022-05-14 ENCOUNTER — Other Ambulatory Visit: Payer: Self-pay | Admitting: Internal Medicine

## 2022-05-16 MED ORDER — TESTOSTERONE ENANTHATE 200 MG/ML IM SOLN: 200.0000 mg | INTRAMUSCULAR | 4 refills | Status: DC

## 2022-05-16 NOTE — Telephone Encounter (Signed)
Okay.  Thanks.

## 2022-05-22 ENCOUNTER — Ambulatory Visit (INDEPENDENT_AMBULATORY_CARE_PROVIDER_SITE_OTHER): Payer: Medicare Other | Admitting: Internal Medicine

## 2022-05-22 ENCOUNTER — Encounter: Payer: Self-pay | Admitting: Internal Medicine

## 2022-05-22 VITALS — BP 148/70 | HR 66 | Temp 98.0°F | Ht 70.0 in | Wt 135.4 lb

## 2022-05-22 DIAGNOSIS — E291 Testicular hypofunction: Secondary | ICD-10-CM

## 2022-05-22 DIAGNOSIS — F5101 Primary insomnia: Secondary | ICD-10-CM

## 2022-05-22 DIAGNOSIS — M545 Low back pain, unspecified: Secondary | ICD-10-CM

## 2022-05-22 DIAGNOSIS — I10 Essential (primary) hypertension: Secondary | ICD-10-CM

## 2022-05-22 DIAGNOSIS — G8929 Other chronic pain: Secondary | ICD-10-CM

## 2022-05-22 MED ORDER — HYDROCODONE-ACETAMINOPHEN 7.5-325 MG PO TABS: 1.0000 | ORAL_TABLET | Freq: Four times a day (QID) | ORAL | 0 refills | Status: DC | PRN

## 2022-05-22 NOTE — Progress Notes (Signed)
Subjective:  Patient ID: Troy Marsh, male    DOB: 1950/08/06  Age: 71 y.o. MRN: [id number]  CC: Follow-up (6 MONTH F/U)   HPI Troy Marsh presents for HTN, insomnia, LBP  Outpatient Medications Prior to Visit  Medication Sig Dispense Refill   alprostadil (CAVERJECT IMPULSE) 20 MCG injection 20 mcg by Intracavitary route as needed for erectile dysfunction. use no more than 3 times per week 18 each 3   aspirin 81 MG tablet Take 81 mg by mouth daily.     baclofen (LIORESAL) 20 MG tablet Take 1 tablet (20 mg total) by mouth 3 (three) times daily. 270 each 3   BD DISP NEEDLES 21G X 1-1/2" MISC USE AS DIRECTED 100 each 0   BELSOMRA 10 MG TABS TAKE 1 TABLET BY MOUTH AT  BEDTIME AS NEEDED 90 tablet 1   benzonatate (TESSALON) 100 MG capsule Take 1 capsule (100 mg total) by mouth every 8 (eight) hours. 21 capsule 0   celecoxib (CELEBREX) 200 MG capsule Take 1 capsule (200 mg total) by mouth 2 (two) times daily. Annual appt is due must see provider for future refills 180 capsule 0   cetirizine-pseudoephedrine (ZYRTEC-D) 5-120 MG tablet Take 1 tablet by mouth daily as needed for allergies.      diltiazem 2 % GEL Diltiazem gel 2% apply rectally twice daily for 4 weeks 30 g 6   emtricitabine-rilpivir-tenofovir AF (ODEFSEY) 200-25-25 MG TABS tablet Take 1 tablet by mouth daily.     fluorouracil (EFUDEX) 5 % cream Apply topically 2 (two) times daily as needed.     gabapentin (NEURONTIN) 300 MG capsule Take 1 capsule by mouth 3 (three) times daily.     hydrochlorothiazide (HYDRODIURIL) 50 MG tablet TAKE 1 TABLET BY MOUTH  DAILY 90 tablet 3   hydrocortisone (ANUSOL-HC) 25 MG suppository UNWRAP AND INSERT 1  SUPPOSITORY RECTALLY TWICE  DAILY 180 suppository 0   linaclotide (LINZESS) 290 MCG CAPS capsule Take 1 capsule (290 mcg total) by mouth daily before breakfast. 90 capsule 4   Melatonin 10 MG TABS Take 10 mg by mouth at bedtime as needed (for sleep).      methocarbamol (ROBAXIN) 500 MG  tablet Take 1 tablet by mouth as needed.     mirabegron ER (MYRBETRIQ) 25 MG TB24 tablet Take 25 mg by mouth as needed (2 TIMES A WEEK).     montelukast (SINGULAIR) 10 MG tablet Take 1 tablet (10 mg total) by mouth daily. Annual appt due in Nov must see provider for future refills 90 tablet 0   Multiple Vitamin (MULTIVITAMIN WITH MINERALS) TABS tablet Take 1 tablet by mouth daily. COMPLETE MULTIVITAMIN MEN'S 50+     NEEDLE, DISP, 18 G (B-D BLUNT FILL NEEDLE) 18G X 1-1/2" MISC USE AS INSTRUCTED 6 each 0   polyethylene glycol powder (GLYCOLAX/MIRALAX) powder Take 17 g by mouth daily as needed for moderate constipation.      potassium chloride (KLOR-CON M) 10 MEQ tablet TAKE 1 TABLET BY MOUTH  DAILY 90 tablet 3   RESTASIS 0.05 % ophthalmic emulsion Place 1 drop into both eyes 2 (two) times daily.     tadalafil (CIALIS) 5 MG tablet TAKE 1 TABLET EVERY DAY 90 tablet 1   testosterone enanthate (DELATESTRYL) 200 MG/ML injection Inject 1 mL (200 mg total) into the muscle every 14 (fourteen) days. 5 mL 4   HYDROcodone-acetaminophen (NORCO) 7.5-325 MG tablet Take 1 tablet by mouth every 6 (six) hours as needed for severe pain.  120 tablet 0   atorvastatin (LIPITOR) 40 MG tablet Take 40 mg by mouth at bedtime.      No facility-administered medications prior to visit.    ROS: Review of Systems  Constitutional:  Positive for fatigue. Negative for appetite change and unexpected weight change.  HENT:  Negative for congestion, nosebleeds, sneezing, sore throat and trouble swallowing.   Eyes:  Negative for itching and visual disturbance.  Respiratory:  Negative for cough.   Cardiovascular:  Negative for chest pain, palpitations and leg swelling.  Gastrointestinal:  Negative for abdominal distention, blood in stool, diarrhea and nausea.  Genitourinary:  Negative for frequency and hematuria.  Musculoskeletal:  Positive for back pain. Negative for gait problem, joint swelling and neck pain.  Skin:  Negative for  rash.  Neurological:  Negative for dizziness, tremors, speech difficulty and weakness.  Psychiatric/Behavioral:  Positive for sleep disturbance. Negative for agitation, dysphoric mood and suicidal ideas. The patient is not nervous/anxious.     Objective:  BP (!) 148/70 (BP Location: Left Arm)   Pulse 66   Temp 98 F (36.7 C) (Oral)   Ht '5\' 10"'$  (1.778 m)   Wt 135 lb 6.4 oz (61.4 kg)   SpO2 99%   BMI 19.43 kg/m   BP Readings from Last 3 Encounters:  05/22/22 (!) 148/70  11/16/21 118/72  08/15/21 (!) 144/78    Wt Readings from Last 3 Encounters:  05/22/22 135 lb 6.4 oz (61.4 kg)  11/16/21 137 lb 6.4 oz (62.3 kg)  08/15/21 141 lb (64 kg)    Physical Exam Constitutional:      General: He is not in acute distress.    Appearance: Normal appearance. He is well-developed.     Comments: NAD  Eyes:     Conjunctiva/sclera: Conjunctivae normal.     Pupils: Pupils are equal, round, and reactive to light.  Neck:     Thyroid: No thyromegaly.     Vascular: No JVD.  Cardiovascular:     Rate and Rhythm: Normal rate and regular rhythm.     Heart sounds: Normal heart sounds. No murmur heard.    No friction rub. No gallop.  Pulmonary:     Effort: Pulmonary effort is normal. No respiratory distress.     Breath sounds: Normal breath sounds. No wheezing or rales.  Chest:     Chest wall: No tenderness.  Abdominal:     General: Bowel sounds are normal. There is no distension.     Palpations: Abdomen is soft. There is no mass.     Tenderness: There is no abdominal tenderness. There is no guarding or rebound.  Musculoskeletal:        General: Tenderness present. Normal range of motion.     Cervical back: Normal range of motion.  Lymphadenopathy:     Cervical: No cervical adenopathy.  Skin:    General: Skin is warm and dry.     Findings: No rash.  Neurological:     Mental Status: He is alert and oriented to person, place, and time.     Cranial Nerves: No cranial nerve deficit.      Motor: No abnormal muscle tone.     Coordination: Coordination normal.     Gait: Gait normal.     Deep Tendon Reflexes: Reflexes are normal and symmetric.  Psychiatric:        Behavior: Behavior normal.        Thought Content: Thought content normal.        Judgment:  Judgment normal.   SD on face LS w/pain  Lab Results  Component Value Date   WBC 4.7 12/05/2021   HGB 16.6 12/05/2021   HCT 47.7 12/05/2021   PLT 139.0 (L) 12/05/2021   GLUCOSE 95 12/05/2021   CHOL 130 12/05/2021   TRIG 57.0 12/05/2021   HDL 69.90 12/05/2021   LDLDIRECT 113.3 05/21/2006   LDLCALC 48 12/05/2021   ALT 18 12/05/2021   AST 25 12/05/2021   NA 135 12/05/2021   K 3.6 12/05/2021   CL 96 12/05/2021   CREATININE 1.24 12/05/2021   BUN 10 12/05/2021   CO2 35 (H) 12/05/2021   TSH 3.56 12/05/2021   PSA 1.18 05/13/2020   INR 1.0 07/06/2020    MR LUMBAR SPINE WO CONTRAST  Result Date: 01/24/2022 CLINICAL DATA:  Low back pain radiating to left hip and thigh EXAM: MRI LUMBAR SPINE WITHOUT CONTRAST TECHNIQUE: Multiplanar, multisequence MR imaging of the lumbar spine was performed. No intravenous contrast was administered. COMPARISON:  11/14/2021 FINDINGS: Segmentation:  Standard. Alignment:  Stable with dextrocurvature. Vertebrae: Stable vertebral body heights with degenerative endplate irregularity and Schmorl's nodes. Degenerative endplate marrow changes are present with edema again noted at L3-L5 eccentric to the left. Conus medullaris and cauda equina: Conus extends to the L1 level. Conus and cauda equina appear normal. Paraspinal and other soft tissues: Unremarkable. Disc levels: T12-L1: Trace right central extrusion extending below disc level. No stenosis. L1-L2: No canal or foraminal stenosis. L2-L3: Disc bulge. Mild facet arthropathy with ligamentum flavum infolding. Mild canal stenosis. Partial effacement of left greater than right subarticular recesses. No right foraminal stenosis. Mild left foraminal  stenosis. L3-L4: Disc bulge. Moderate facet arthropathy with ligamentum flavum infolding. Moderate canal stenosis. Partial effacement subarticular recesses. Mild foraminal stenosis. L4-L5: Disc bulge with endplate osteophytic ridging. Moderate facet arthropathy with ligamentum flavum infolding. Marked canal stenosis. Partial effacement of subarticular recesses. Mild right and mild to moderate left foraminal stenosis. L5-S1: Disc bulge slightly eccentric to the left. Mild facet arthropathy. No canal or foraminal stenosis. IMPRESSION: Multilevel degenerative changes as detailed above are unchanged from recent prior study. Canal stenosis remains greatest at L4-L5. There is degenerative marrow edema at L3-L5 eccentric to the left. Electronically Signed   By: Macy Mis M.D.   On: 01/24/2022 08:38    Assessment & Plan:   Problem List Items Addressed This Visit     HTN (hypertension)    Cont on HCTZ, Kdur      Relevant Orders   TSH   Urinalysis   CBC with Differential/Platelet   Lipid panel   Comprehensive metabolic panel   PSA   Testosterone   Hypogonadism male - Primary    Testosterone replacment q 2 weeks IM       Relevant Orders   TSH   Urinalysis   CBC with Differential/Platelet   Lipid panel   Comprehensive metabolic panel   PSA   Testosterone   Insomnia    On Belsomra 15 mg/d.       Relevant Orders   TSH   Urinalysis   CBC with Differential/Platelet   Lipid panel   Comprehensive metabolic panel   PSA   Testosterone   Low back pain    S/p  L4 nerve ablation - not better F/u Dr Ron Agee       Relevant Medications   HYDROcodone-acetaminophen (NORCO) 7.5-325 MG tablet   HYDROcodone-acetaminophen (NORCO) 7.5-325 MG tablet   HYDROcodone-acetaminophen (NORCO) 7.5-325 MG tablet   Other Relevant Orders  TSH   Urinalysis   CBC with Differential/Platelet   Lipid panel   Comprehensive metabolic panel   PSA   Testosterone      Meds ordered this encounter   Medications   HYDROcodone-acetaminophen (NORCO) 7.5-325 MG tablet    Sig: Take 1 tablet by mouth every 6 (six) hours as needed for severe pain.    Dispense:  120 tablet    Refill:  0    Please fill on or after 08/16/22   HYDROcodone-acetaminophen (NORCO) 7.5-325 MG tablet    Sig: Take 1 tablet by mouth every 6 (six) hours as needed for severe pain.    Dispense:  120 tablet    Refill:  0    Please fill on or after 06/16/22   HYDROcodone-acetaminophen (NORCO) 7.5-325 MG tablet    Sig: Take 1 tablet by mouth every 6 (six) hours as needed for severe pain.    Dispense:  120 tablet    Refill:  0    Please fill on or after 07/16/22      Follow-up: Return in about 6 months (around 11/20/2022) for Wellness Exam.  [name], MD

## 2022-05-22 NOTE — Assessment & Plan Note (Signed)
On Belsomra 15 mg/d.

## 2022-05-22 NOTE — Assessment & Plan Note (Signed)
Cont on HCTZ, Kdur

## 2022-05-22 NOTE — Assessment & Plan Note (Signed)
Testosterone replacment q 2 weeks IM

## 2022-05-22 NOTE — Assessment & Plan Note (Signed)
S/p  L4 nerve ablation - not better F/u Dr Ron Agee

## 2022-05-24 ENCOUNTER — Telehealth: Payer: Self-pay | Admitting: Internal Medicine

## 2022-05-24 NOTE — Telephone Encounter (Signed)
Pt called requesting provider renew RX for a year for effective date 06/16/22.  [place] [place] [address] [place] [address] Phone: [phone]  Fax: [phone]   Pt ph [phone]

## 2022-05-25 MED ORDER — HYDROCHLOROTHIAZIDE 50 MG PO TABS: 50.0000 mg | ORAL_TABLET | Freq: Every day | ORAL | 3 refills | Status: DC

## 2022-05-25 NOTE — Telephone Encounter (Signed)
Sent refill to [facility].Marland KitchenJohny Chess

## 2022-05-26 ENCOUNTER — Telehealth: Payer: Self-pay | Admitting: Gastroenterology

## 2022-05-26 NOTE — Telephone Encounter (Signed)
Patient called stating that he has no more refills on his Hydrocortisone cream and was wondering if Dr Lyndel Safe could provide him with more. Please advise

## 2022-05-28 ENCOUNTER — Emergency Department (HOSPITAL_BASED_OUTPATIENT_CLINIC_OR_DEPARTMENT_OTHER): Payer: Medicare Other

## 2022-05-28 ENCOUNTER — Emergency Department (HOSPITAL_BASED_OUTPATIENT_CLINIC_OR_DEPARTMENT_OTHER)
Admission: EM | Admit: 2022-05-28 | Discharge: 2022-05-28 | Disposition: A | Discharge status: Home / Self Care | Payer: Medicare Other | Attending: Emergency Medicine | Admitting: Emergency Medicine

## 2022-05-28 ENCOUNTER — Encounter (HOSPITAL_BASED_OUTPATIENT_CLINIC_OR_DEPARTMENT_OTHER): Payer: Self-pay

## 2022-05-28 ENCOUNTER — Other Ambulatory Visit: Payer: Self-pay

## 2022-05-28 DIAGNOSIS — Z7982 Long term (current) use of aspirin: Secondary | ICD-10-CM | POA: Insufficient documentation

## 2022-05-28 DIAGNOSIS — S022XXA Fracture of nasal bones, initial encounter for closed fracture: Secondary | ICD-10-CM | POA: Diagnosis not present

## 2022-05-28 DIAGNOSIS — S0993XA Unspecified injury of face, initial encounter: Secondary | ICD-10-CM | POA: Diagnosis present

## 2022-05-28 DIAGNOSIS — W228XXA Striking against or struck by other objects, initial encounter: Secondary | ICD-10-CM | POA: Diagnosis not present

## 2022-05-28 MED ORDER — AMOXICILLIN-POT CLAVULANATE 875-125 MG PO TABS: 1.0000 | ORAL_TABLET | Freq: Two times a day (BID) | ORAL | 0 refills | Status: AC

## 2022-05-28 NOTE — Discharge Instructions (Addendum)
Do not blow your nose for the next 2 weeks.  For the next 3 days you can use 1 squirt of Afrin in each nose at night to help as a decongestant.  You should also take the antibiotic Augmentin as prescribed, with food, to prevent infection.  Please call [facility] to schedule follow-up appointment.  Your Dermabond skin glue should dissolve on its own over the next couple days.  You can shower normally.  Do not go swimming or submerge your head underwater, until you are cleared to do so by the ENT doctor.

## 2022-05-28 NOTE — ED Provider Notes (Addendum)
[facility] EMERGENCY DEPT Provider Note   CSN: [id number] Arrival date & time: 05/28/22  0900     History  Chief Complaint  Patient presents with   Facial Injury    Troy Marsh is a 71 y.o. male presenting with facial injury 2 nights ago accidentally slammed front of face/nose against a towel rack.  No LOC.  Not on A/C.  No other injuries  HPI     Home Medications Prior to Admission medications   Medication Sig Start Date End Date Taking? Authorizing Provider  amoxicillin-clavulanate (AUGMENTIN) 875-125 MG tablet Take 1 tablet by mouth every 12 (twelve) hours for 5 days. 05/28/22 06/02/22 Yes Dorine Duffey, Carola Rhine, MD  alprostadil (CAVERJECT IMPULSE) 20 MCG injection 20 mcg by Intracavitary route as needed for erectile dysfunction. use no more than 3 times per week 07/12/21   Plotnikov, Evie Lacks, MD  aspirin 81 MG tablet Take 81 mg by mouth daily.    [provider]  atorvastatin (LIPITOR) 40 MG tablet Take 40 mg by mouth at bedtime.  11/29/15 12/02/20  [provider]  baclofen (LIORESAL) 20 MG tablet Take 1 tablet (20 mg total) by mouth 3 (three) times daily. 05/13/20   Plotnikov, Evie Lacks, MD  BD DISP NEEDLES 21G X 1-1/2" MISC USE AS DIRECTED 12/23/18   Plotnikov, Evie Lacks, MD  BELSOMRA 10 MG TABS TAKE 1 TABLET BY MOUTH AT  BEDTIME AS NEEDED 05/11/22   Plotnikov, Evie Lacks, MD  benzonatate (TESSALON) 100 MG capsule Take 1 capsule (100 mg total) by mouth every 8 (eight) hours. 07/19/21   Redwine, Madison A, PA-C  celecoxib (CELEBREX) 200 MG capsule Take 1 capsule (200 mg total) by mouth 2 (two) times daily. Annual appt is due must see provider for future refills 05/15/22   Plotnikov, Evie Lacks, MD  cetirizine-pseudoephedrine (ZYRTEC-D) 5-120 MG tablet Take 1 tablet by mouth daily as needed for allergies.     [provider]  diltiazem 2 % GEL Diltiazem gel 2% apply rectally twice daily for 4 weeks 06/22/21   Jackquline Denmark, MD   emtricitabine-rilpivir-tenofovir AF (ODEFSEY) 200-25-25 MG TABS tablet Take 1 tablet by mouth daily. 04/10/16   [provider]  fluorouracil (EFUDEX) 5 % cream Apply topically 2 (two) times daily as needed. 10/26/20   [provider]  gabapentin (NEURONTIN) 300 MG capsule Take 1 capsule by mouth 3 (three) times daily. 12/01/20   [provider]  hydrochlorothiazide (HYDRODIURIL) 50 MG tablet Take 1 tablet (50 mg total) by mouth daily. 05/25/22   Plotnikov, Evie Lacks, MD  HYDROcodone-acetaminophen (NORCO) 7.5-325 MG tablet Take 1 tablet by mouth every 6 (six) hours as needed for severe pain. 05/22/22   Plotnikov, Evie Lacks, MD  HYDROcodone-acetaminophen (NORCO) 7.5-325 MG tablet Take 1 tablet by mouth every 6 (six) hours as needed for severe pain. 05/22/22   Plotnikov, Evie Lacks, MD  HYDROcodone-acetaminophen (NORCO) 7.5-325 MG tablet Take 1 tablet by mouth every 6 (six) hours as needed for severe pain. 05/22/22   Plotnikov, Evie Lacks, MD  hydrocortisone (ANUSOL-HC) 25 MG suppository UNWRAP AND INSERT 1  SUPPOSITORY RECTALLY TWICE  DAILY 06/14/21   Jackquline Denmark, MD  linaclotide [facility]) 290 MCG CAPS capsule Take 1 capsule (290 mcg total) by mouth daily before breakfast. 06/22/21   Jackquline Denmark, MD  Melatonin 10 MG TABS Take 10 mg by mouth at bedtime as needed (for sleep).     [provider]  methocarbamol (ROBAXIN) 500 MG tablet Take 1 tablet  by mouth as needed. 07/03/20   [provider]  mirabegron ER (MYRBETRIQ) 25 MG TB24 tablet Take 25 mg by mouth as needed (2 TIMES A WEEK).    [provider]  montelukast (SINGULAIR) 10 MG tablet Take 1 tablet (10 mg total) by mouth daily. Annual appt due in Nov must see provider for future refills 04/03/22   Plotnikov, Evie Lacks, MD  Multiple Vitamin (MULTIVITAMIN WITH MINERALS) TABS tablet Take 1 tablet by mouth daily. COMPLETE MULTIVITAMIN MEN'S 50+    [provider]  NEEDLE, DISP, 18 G (B-D BLUNT  FILL NEEDLE) 18G X 1-1/2" MISC USE AS INSTRUCTED 10/06/21   Plotnikov, Evie Lacks, MD  polyethylene glycol powder (GLYCOLAX/MIRALAX) powder Take 17 g by mouth daily as needed for moderate constipation.     [provider]  potassium chloride (KLOR-CON M) 10 MEQ tablet TAKE 1 TABLET BY MOUTH  DAILY 04/03/22   Plotnikov, Evie Lacks, MD  RESTASIS 0.05 % ophthalmic emulsion Place 1 drop into both eyes 2 (two) times daily. 10/25/15   [provider]  tadalafil (CIALIS) 5 MG tablet TAKE 1 TABLET EVERY DAY 01/04/22   Plotnikov, Evie Lacks, MD  testosterone enanthate (DELATESTRYL) 200 MG/ML injection Inject 1 mL (200 mg total) into the muscle every 14 (fourteen) days. 05/16/22   Plotnikov, Evie Lacks, MD      Allergies    Patient has no known allergies.    Review of Systems   Review of Systems  Physical Exam Updated Vital Signs BP (!) 157/73   Pulse 63   Temp 97.6 F (36.4 C) (Oral)   Resp 16   Ht '5\' 10"'$  (1.778 m)   Wt 61.2 kg   SpO2 99%   BMI 19.37 kg/m  Physical Exam Constitutional:      General: He is not in acute distress. HENT:     Head: Normocephalic.     Comments: 1.5 cm v shaped laceration mid-forehead Raccoon eyes EOM intact     Nose: Signs of injury and nasal tenderness present. No nasal deformity, septal deviation or laceration.     Right Nostril: No epistaxis, septal hematoma or occlusion.     Left Nostril: No epistaxis, septal hematoma or occlusion.  Eyes:     Conjunctiva/sclera: Conjunctivae normal.     Pupils: Pupils are equal, round, and reactive to light.  Cardiovascular:     Rate and Rhythm: Normal rate and regular rhythm.  Pulmonary:     Effort: Pulmonary effort is normal. No respiratory distress.  Abdominal:     General: There is no distension.     Tenderness: There is no abdominal tenderness.  Skin:    General: Skin is warm and dry.  Neurological:     General: No focal deficit present.     Mental Status: He is alert. Mental status is at  baseline.  Psychiatric:        Mood and Affect: Mood normal.        Behavior: Behavior normal.     ED Results / Procedures / Treatments   Labs (all labs ordered are listed, but only abnormal results are displayed) Labs Reviewed - No data to display  EKG EKG Interpretation  Date/Time:  Sunday May 28 2022 09:37:28 EST Ventricular Rate:  67 PR Interval:  152 QRS Duration: 136 QT Interval:  412 QTC Calculation: 435 R Axis:   28 Text Interpretation: Sinus tachycardia with Blocked Premature atrial complexes Right bundle branch block Abnormal ECG No previous ECGs available Confirmed  by Octaviano Glow [phone]) on 05/28/2022 11:25:24 AM  Radiology CT Cervical Spine Wo Contrast  Result Date: 05/28/2022 CLINICAL DATA:  Trauma, fall EXAM: CT CERVICAL SPINE WITHOUT CONTRAST TECHNIQUE: Multidetector CT imaging of the cervical spine was performed without intravenous contrast. Multiplanar CT image reconstructions were also generated. RADIATION DOSE REDUCTION: This exam was performed according to the departmental dose-optimization program which includes automated exposure control, adjustment of the mA and/or kV according to patient size and/or use of iterative reconstruction technique. COMPARISON:  Previous CT done on 05/07/2018 FINDINGS: Alignment: Alignment of posterior margins of vertebral bodies is within normal limits. Skull base and vertebrae: No recent fracture is seen. Anterior and posterior surgical fusion is noted from C4-C7 levels. Soft tissues and spinal canal: There is no central spinal stenosis. Disc levels: There is narrowing of left neural foramen at C2-C3 level. There is narrowing of neural foramina from C3 to C7 levels. Upper chest: Centrilobular emphysema is seen. Multiple blebs and bullae are seen in the upper lung fields. Other: None. IMPRESSION: No acute findings are seen CT C-spine. There is anterior and posterior surgical fusion from C4-C7 levels. Cervical spondylosis with  encroachment of neural foramina from C2-C7 levels. Electronically Signed   By: Elmer Picker M.D.   On: 05/28/2022 10:34   CT Head Wo Contrast  Result Date: 05/28/2022 CLINICAL DATA:  Trauma, fall EXAM: CT HEAD WITHOUT CONTRAST TECHNIQUE: Contiguous axial images were obtained from the base of the skull through the vertex without intravenous contrast. RADIATION DOSE REDUCTION: This exam was performed according to the departmental dose-optimization program which includes automated exposure control, adjustment of the mA and/or kV according to patient size and/or use of iterative reconstruction technique. COMPARISON:  None Available. FINDINGS: Brain: No acute intracranial findings are seen. There are no signs of bleeding within the cranium. Ventricles are not dilated. Cortical sulci are prominent. Vascular: Unremarkable. Skull: No fracture is seen. Sinuses/Orbits: Unremarkable. Other: None. IMPRESSION: No acute intracranial findings are seen in noncontrast CT brain. Atrophy. Electronically Signed   By: Elmer Picker M.D.   On: 05/28/2022 10:28    Procedures .Marland KitchenLaceration Repair  Date/Time: 05/28/2022 11:41 AM  Performed by: Wyvonnia Dusky, MD Authorized by: Wyvonnia Dusky, MD   Consent:    Consent obtained:  Verbal   Consent given by:  Patient   Risks discussed:  Pain, poor cosmetic result and poor wound healing Universal protocol:    Procedure explained and questions answered to patient or proxy's satisfaction: yes     Immediately prior to procedure, a time out was called: yes     Patient identity confirmed:  Arm band Anesthesia:    Anesthesia method:  None Laceration details:    Location:  Face   Length (cm):  1.5 Exploration:    Limited defect created (wound extended): no     Imaging outcome: foreign body not noted     Contaminated: no   Skin repair:    Repair method:  Tissue adhesive Approximation:    Approximation:  Close Repair type:    Repair type:   Simple Post-procedure details:    Dressing:  Non-adherent dressing   Procedure completion:  Tolerated well, no immediate complications     Medications Ordered in ED Medications - No data to display  ED Course/ Medical Decision Making/ A&P                           Medical Decision Making Amount and/or Complexity  of Data Reviewed Radiology: ordered.  Risk Prescription drug management.   Patient is here from facial trauma.  CT imaging ordered and personally read interpreted, showing comminuted fracture of the nasal bone, maxilla fracture as well.  He does not have evidence of septal hematoma, nasal passage appears clear.  No evidence of extraocular muscle entrapment, blurred or double vision.  No sign of intracranial hemorrhage.  I think this is reasonable for outpatient follow-up.  I recommended Afrin for a few days as a decongestant, we discussed nose blowing precautions, will also put on Augmentin as prophylaxis for 5 days, and have him follow-up with ENT for both fractures.  The wound on the forehead is fairly superficial.  It is already been 2 days.  We discussed sutures versus Dermabond and opted for Dermabond which is reasonable.  Okay for discharge        Final Clinical Impression(s) / ED Diagnoses Final diagnoses:  Closed fracture of nasal bone, initial encounter    Rx / DC Orders ED Discharge Orders          Ordered    amoxicillin-clavulanate (AUGMENTIN) 875-125 MG tablet  Every 12 hours        05/28/22 1140              Wyvonnia Dusky, MD 05/28/22 1141    Wyvonnia Dusky, MD 05/28/22 [phone]

## 2022-05-28 NOTE — ED Notes (Signed)
ED Provider at bedside. 

## 2022-05-28 NOTE — ED Triage Notes (Signed)
Pt reports Saturday morning he was getting off the toilet and tripped and fell into a towel rack. Pt struck his face on the rack. Pt has bruising to nose and bilateral eyes. Pt reports a laceration on the brim of his nose, bandage currently in place. Pt denies loc, no blood thinners. Pt is AxOx4.

## 2022-05-29 ENCOUNTER — Telehealth: Payer: Self-pay | Admitting: *Deleted

## 2022-05-29 NOTE — Telephone Encounter (Signed)
Transition Care Management Unsuccessful Follow-up Telephone Call  Date of discharge and from where:  05/28/22 from [facility]  Attempts:  1st Attempt  Reason for unsuccessful TCM follow-up call:  Left voice message

## 2022-05-29 NOTE — Telephone Encounter (Signed)
Pt called in stating that he is requesting another refill for the Hydrocortisone Suppository: Chart reviewed. Noted its been a year since pt last in office. Pt was scheduled for a follow up with Dr Lyndel Safe on 06/27/2022 at 8:50: Address provided: Pt was questioned if he had enough of the suppository to last to the office visit. Pt stated that he did and was just being proactive: Address provided  Pt verbalized understanding with all questions answered.

## 2022-05-30 NOTE — Telephone Encounter (Signed)
Transition Care Management Follow-up Telephone Call Date of discharge and from where: 05/28/22 from [facility] How have you been since you were released from the hospital? Pt states fine. He states he will be f/u with ENT on Thursday 16th. He states he didn't realize that he broken his nose when he has fell, but he is ok. He did not make f/u appt w/[name]

## 2022-06-02 ENCOUNTER — Other Ambulatory Visit (INDEPENDENT_AMBULATORY_CARE_PROVIDER_SITE_OTHER): Payer: Medicare Other

## 2022-06-02 DIAGNOSIS — F5101 Primary insomnia: Secondary | ICD-10-CM

## 2022-06-02 DIAGNOSIS — M545 Low back pain, unspecified: Secondary | ICD-10-CM | POA: Diagnosis not present

## 2022-06-02 DIAGNOSIS — G8929 Other chronic pain: Secondary | ICD-10-CM

## 2022-06-02 DIAGNOSIS — I10 Essential (primary) hypertension: Secondary | ICD-10-CM

## 2022-06-02 DIAGNOSIS — S022XXA Fracture of nasal bones, initial encounter for closed fracture: Secondary | ICD-10-CM

## 2022-06-02 DIAGNOSIS — E291 Testicular hypofunction: Secondary | ICD-10-CM

## 2022-06-02 HISTORY — DX: Fracture of nasal bones, initial encounter for closed fracture: S02.2XXA

## 2022-06-02 LAB — COMPREHENSIVE METABOLIC PANEL
ALT: 15 U/L (ref 0–53)
AST: 24 U/L (ref 0–37)
Albumin: 3.9 g/dL (ref 3.5–5.2)
Alkaline Phosphatase: 57 U/L (ref 39–117)
BUN: 10 mg/dL (ref 6–23)
CO2: 36 mEq/L — ABNORMAL HIGH (ref 19–32)
Calcium: 8.5 mg/dL (ref 8.4–10.5)
Chloride: 92 mEq/L — ABNORMAL LOW (ref 96–112)
Creatinine, Ser: 1.12 mg/dL (ref 0.40–1.50)
GFR: 65.71 mL/min (ref >60.00)
Glucose, Bld: 85 mg/dL (ref 70–99)
Potassium: 3.7 mEq/L (ref 3.5–5.1)
Sodium: 133 mEq/L — ABNORMAL LOW (ref 135–145)
Total Bilirubin: 0.9 mg/dL (ref 0.2–1.2)
Total Protein: 5.7 g/dL — ABNORMAL LOW (ref 6.0–8.3)

## 2022-06-02 LAB — URINALYSIS
Bilirubin Urine: NEGATIVE
Hgb urine dipstick: NEGATIVE
Ketones, ur: NEGATIVE
Leukocytes,Ua: NEGATIVE
Nitrite: NEGATIVE
Specific Gravity, Urine: 1.01 (ref 1.000–1.030)
Total Protein, Urine: NEGATIVE
Urine Glucose: NEGATIVE
Urobilinogen, UA: 0.2 (ref 0.0–1.0)
pH: 7 (ref 5.0–8.0)

## 2022-06-02 LAB — CBC WITH DIFFERENTIAL/PLATELET
Basophils Absolute: 0 10*3/uL (ref 0.0–0.1)
Basophils Relative: 1 % (ref 0.0–3.0)
Eosinophils Absolute: 0.1 10*3/uL (ref 0.0–0.7)
Eosinophils Relative: 1.4 % (ref 0.0–5.0)
HCT: 45.7 % (ref 39.0–52.0)
Hemoglobin: 15.9 g/dL (ref 13.0–17.0)
Lymphocytes Relative: 31.8 % (ref 12.0–46.0)
Lymphs Abs: 1.4 10*3/uL (ref 0.7–4.0)
MCHC: 34.7 g/dL (ref 30.0–36.0)
MCV: 96.8 fl (ref 78.0–100.0)
Monocytes Absolute: 0.6 10*3/uL (ref 0.1–1.0)
Monocytes Relative: 13.7 % — ABNORMAL HIGH (ref 3.0–12.0)
Neutro Abs: 2.4 10*3/uL (ref 1.4–7.7)
Neutrophils Relative %: 52.1 % (ref 43.0–77.0)
Platelets: 247 10*3/uL (ref 150.0–400.0)
RBC: 4.72 Mil/uL (ref 4.22–5.81)
RDW: 14.1 % (ref 11.5–15.5)
WBC: 4.6 10*3/uL (ref 4.0–10.5)

## 2022-06-02 LAB — LIPID PANEL
Cholesterol: 128 mg/dL (ref 0–200)
HDL: 73.3 mg/dL (ref >39.00)
LDL Cholesterol: 47 mg/dL (ref 0–99)
NonHDL: 54.69
Total CHOL/HDL Ratio: 2
Triglycerides: 39 mg/dL (ref 0.0–149.0)
VLDL: 7.8 mg/dL (ref 0.0–40.0)

## 2022-06-02 LAB — TESTOSTERONE: Testosterone: 785.68 ng/dL (ref 300.00–890.00)

## 2022-06-02 LAB — PSA: PSA: 1.56 ng/mL (ref 0.10–4.00)

## 2022-06-02 LAB — TSH: TSH: 4.47 u[IU]/mL (ref 0.35–5.50)

## 2022-06-03 ENCOUNTER — Other Ambulatory Visit: Payer: Self-pay | Admitting: Internal Medicine

## 2022-06-19 ENCOUNTER — Telehealth: Payer: Self-pay | Admitting: *Deleted

## 2022-06-19 NOTE — Telephone Encounter (Signed)
Pt need PA on Hydrocodone 7.5/325 mg. Submitted w/ (Key: [id number]) PA sent has been sent to [facility]...[name]

## 2022-06-19 NOTE — Telephone Encounter (Signed)
Rec'd determination med was APPROVED. Effective now approved through 07/19/2022. Faxed approval to [facility].Marland KitchenJohny Chess

## 2022-06-27 ENCOUNTER — Encounter: Payer: Self-pay | Admitting: Gastroenterology

## 2022-06-27 ENCOUNTER — Ambulatory Visit (INDEPENDENT_AMBULATORY_CARE_PROVIDER_SITE_OTHER): Payer: Medicare Other | Admitting: Gastroenterology

## 2022-06-27 VITALS — BP 138/82 | HR 85 | Ht 70.0 in | Wt 132.0 lb

## 2022-06-27 DIAGNOSIS — Z8719 Personal history of other diseases of the digestive system: Secondary | ICD-10-CM

## 2022-06-27 DIAGNOSIS — K648 Other hemorrhoids: Secondary | ICD-10-CM | POA: Diagnosis not present

## 2022-06-27 DIAGNOSIS — K602 Anal fissure, unspecified: Secondary | ICD-10-CM | POA: Diagnosis not present

## 2022-06-27 MED ORDER — HYDROCORTISONE ACETATE 25 MG RE SUPP: 25.0000 mg | Freq: Two times a day (BID) | RECTAL | 7 refills | Status: DC

## 2022-06-27 MED ORDER — LINACLOTIDE 290 MCG PO CAPS: 290.0000 ug | ORAL_CAPSULE | Freq: Every day | ORAL | 4 refills | Status: DC

## 2022-06-27 MED ORDER — AMBULATORY NON FORMULARY MEDICATION: 6 refills | Status: DC

## 2022-06-27 MED ORDER — HYDROCORTISONE ACETATE 25 MG RE SUPP: 25.0000 mg | Freq: Two times a day (BID) | RECTAL | 5 refills | Status: DC

## 2022-06-27 NOTE — Patient Instructions (Addendum)
_______________________________________________________  If you are age 72 or older, your body mass index should be between 23-30. Your Body mass index is 18.94 kg/m. If this is out of the aforementioned range listed, please consider follow up with your Primary Care Provider.  If you are age 58 or younger, your body mass index should be between 19-25. Your Body mass index is 18.94 kg/m. If this is out of the aformentioned range listed, please consider follow up with your Primary Care Provider.   ________________________________________________________  The [facility] providers would like to encourage you to use [facility] to communicate with providers for non-urgent requests or questions.  Due to long hold times on the telephone, sending your provider a message by [facility] may be a faster and more efficient way to get a response.  Please allow 48 business hours for a response.  Please remember that this is for non-urgent requests.  _______________________________________________________  We have sent the following medications to your pharmacy for you to pick up at your convenience: [place]  Print script of [facility]'s information is below: Address: [address] [place], [place] [address]  [phone]  *Please DO NOT go directly from our office to pick up this medication! Give the pharmacy 1 day to process the prescription as this is compounded and takes time to make.  Continue prunes  Minimize pain medications  Continue miralax 17g daily and metamucil daily in [employer]

## 2022-06-27 NOTE — Progress Notes (Signed)
Chief Complaint: FU  Referring Provider:  Cassandria Anger, MD      ASSESSMENT AND PLAN;   #1. Anal fissure and internal hemorrhoids.  #2. H/O constipation (uses hydrocodone, Celebrex), neg colon Jan 20232  Plan: - Minimize use of pain medications - Continue Metamucil QD with 8 ounces of juice - Continue miralax 17g po qd. - Anusol HC suppositories 1 BID after the BMs x 10 days PRN, 6 refills. - Linzess 290 QD #90, 4 refills - Continue prunes.  - diltiazem cream 2% gel BID x 4 weeks 6 refills - No need to repeat colonoscopy unless new problems. - FU PRN    HPI:    Troy Marsh is a 71 y.o. male  With HIV, HTN, OA S/P neck Sx ACD and right inguinal hernia repair on chronic pain medications For follow-up visit  Doing much better. Here for prescription refill-for Linzess, diltiazem cream, Anusol HC suppositories. Has rectal bleeding only when he gets constipated.  Has been doing well with Anusol HC suppositories, Metamucil, MiraLAX, Linzess.  No abdominal pain.  Denies having any upper GI symptoms.  No weight loss.  No jaundice dark urine or pale stools.  No fever chills or night sweats.  Married to [employer] (patient of ours) Richardson Landry- recurrence of lymphoma)  Previous GI procedures:  Colon 08/15/2021 - One 6 mm polyp in the mid descending colon, removed with a cold snare. Resected and retrieved. Bx- hyperplastiic. No need to rpt d/t age, unless problems. - Minimal sigmoid diverticulosis - Non-bleeding internal hemorrhoids. - Chronic posterior anal fissure - The examined portion of the ileum was normal. - The examination was otherwise normal on direct and retroflexion views.  Colonoscopy 09/2018 fair prep. - Non-bleeding internal hemorrhoids. - Otherwise grossly normal colonoscopy. - Repeat in 3 years d/t HIV, quality of prep  CT AP 06/2020 No acute abnormality.  No finding to explain rectal bleeding. Prostatomegaly. Aortic Atherosclerosis  (ICD10-I70.0).  Past Medical History:  Diagnosis Date   Arthritis    BPH (benign prostatic hyperplasia)    [facility]  DR. Elie Goody   Cancer [facility])    Skin cancer- basal and squamous   Constipation due to pain medication    HIV disease ([place]) 1985   last CD4 10/02/12 was 277   Hyperlipidemia    Hypertension    Hypogonadism male     Past Surgical History:  Procedure Laterality Date   ANTERIOR CERVICAL DECOMP/DISCECTOMY FUSION Bilateral 11/22/2017   Procedure: ANTERIOR CERVICAL DECOMPRESSION FUSION CERVICAL 4-5, CERVICAL 5-6, CERVICAL 6-7 WITH INSTRUMENTATION AND ALLOGRAFT TIME      /   REQUESTED 4 HOURS;  Surgeon: Phylliss Bob, MD;  Location: [place];  Service: Orthopedics;  Laterality: Bilateral;   BACK SURGERY     COLONOSCOPY W/ POLYPECTOMY     EYE SURGERY Bilateral    Lasik, Cataract   HEMORRHOID SURGERY  1973   INGUINAL HERNIA REPAIR Right 06/11/2018   Procedure: LAPAROSCOPIC RIGHT INGUINAL HERNIA WITH MESH;  Surgeon: Ralene Ok, MD;  Location: [place];  Service: General;  Laterality: Right;   INSERTION OF MESH Right 06/11/2018   Procedure: INSERTION OF MESH;  Surgeon: Ralene Ok, MD;  Location: [place];  Service: General;  Laterality: Right;   KNEE ARTHROSCOPY Right 2009   KNEE ARTHROSCOPY Left 2011   neck surgery  1984   POSTERIOR CERVICAL FUSION/FORAMINOTOMY N/A 05/29/2018   Procedure: POSTERIOR CERVICAL DECOMPRESSION FUSION CERVICAL 4 - CERVICAL 7 WITH INSTRUMENTATION AND ALLOGRAFT;  Surgeon: Lynann Bologna,  Elta Guadeloupe, MD;  Location: [place];  Service: Orthopedics;  Laterality: N/A;    Family History  Problem Relation Age of Onset   Coronary artery disease Father    Heart attack Father    Hypertension Father    Hyperlipidemia Father    Cancer Father        prostate   Colitis Maternal Grandmother    Diabetes Neg Hx    Colon cancer Neg Hx    Rectal cancer Neg Hx    Stomach cancer Neg Hx    Esophageal cancer Neg Hx     Social History   Tobacco Use   Smoking  status: Former    Packs/day: 2.00    Years: 35.00    Total pack years: 70.00    Types: Cigarettes    Quit date: 02/10/2004    Years since quitting: 18.3   Smokeless tobacco: Never  Vaping Use   Vaping Use: Never used  Substance Use Topics   Alcohol use: No    Comment: quit 2004   Drug use: No    Current Outpatient Medications  Medication Sig Dispense Refill   alprostadil (CAVERJECT IMPULSE) 20 MCG injection 20 mcg by Intracavitary route as needed for erectile dysfunction. use no more than 3 times per week 18 each 3   aspirin 81 MG tablet Take 81 mg by mouth daily.     baclofen (LIORESAL) 20 MG tablet Take 1 tablet (20 mg total) by mouth 3 (three) times daily. 270 each 3   BD DISP NEEDLES 21G X 1-1/2" MISC USE AS DIRECTED 100 each 0   BELSOMRA 10 MG TABS TAKE 1 TABLET BY MOUTH AT  BEDTIME AS NEEDED 90 tablet 1   celecoxib (CELEBREX) 200 MG capsule Take 1 capsule (200 mg total) by mouth 2 (two) times daily. Annual appt is due must see provider for future refills 180 capsule 0   cetirizine-pseudoephedrine (ZYRTEC-D) 5-120 MG tablet Take 1 tablet by mouth daily as needed for allergies.      diltiazem 2 % GEL Diltiazem gel 2% apply rectally twice daily for 4 weeks 30 g 6   emtricitabine-rilpivir-tenofovir AF (ODEFSEY) 200-25-25 MG TABS tablet Take 1 tablet by mouth daily.     gabapentin (NEURONTIN) 300 MG capsule Take 1 capsule by mouth 3 (three) times daily.     hydrochlorothiazide (HYDRODIURIL) 50 MG tablet Take 1 tablet (50 mg total) by mouth daily. 90 tablet 3   HYDROcodone-acetaminophen (NORCO) 7.5-325 MG tablet Take 1 tablet by mouth every 6 (six) hours as needed for severe pain. 120 tablet 0   HYDROcodone-acetaminophen (NORCO) 7.5-325 MG tablet Take 1 tablet by mouth every 6 (six) hours as needed for severe pain. 120 tablet 0   HYDROcodone-acetaminophen (NORCO) 7.5-325 MG tablet Take 1 tablet by mouth every 6 (six) hours as needed for severe pain. 120 tablet 0   hydrocortisone  (ANUSOL-HC) 25 MG suppository UNWRAP AND INSERT 1  SUPPOSITORY RECTALLY TWICE  DAILY 180 suppository 0   linaclotide (LINZESS) 290 MCG CAPS capsule Take 1 capsule (290 mcg total) by mouth daily before breakfast. 90 capsule 4   Melatonin 10 MG TABS Take 10 mg by mouth at bedtime as needed (for sleep).      methocarbamol (ROBAXIN) 500 MG tablet Take 1 tablet by mouth as needed.     mirabegron ER (MYRBETRIQ) 25 MG TB24 tablet Take 25 mg by mouth as needed (2 TIMES A WEEK).     montelukast (SINGULAIR) 10 MG tablet Take 1 tablet (  10 mg total) by mouth daily. 90 tablet 3   Multiple Vitamin (MULTIVITAMIN WITH MINERALS) TABS tablet Take 1 tablet by mouth daily. COMPLETE MULTIVITAMIN MEN'S 50+     NEEDLE, DISP, 18 G (B-D BLUNT FILL NEEDLE) 18G X 1-1/2" MISC USE AS INSTRUCTED 6 each 0   polyethylene glycol powder (GLYCOLAX/MIRALAX) powder Take 17 g by mouth daily as needed for moderate constipation.      potassium chloride (KLOR-CON M) 10 MEQ tablet TAKE 1 TABLET BY MOUTH  DAILY 90 tablet 3   RESTASIS 0.05 % ophthalmic emulsion Place 1 drop into both eyes 2 (two) times daily.     tadalafil (CIALIS) 5 MG tablet TAKE 1 TABLET EVERY DAY 90 tablet 1   testosterone enanthate (DELATESTRYL) 200 MG/ML injection Inject 1 mL (200 mg total) into the muscle every 14 (fourteen) days. 5 mL 4   atorvastatin (LIPITOR) 40 MG tablet Take 40 mg by mouth at bedtime.      No current facility-administered medications for this visit.    No Known Allergies  Review of Systems:  Negative except for HPI.    Physical Exam:    BP 138/82   Pulse 85   Ht '5\' 10"'$  (1.778 m)   Wt 132 lb (59.9 kg)   SpO2 98%   BMI 18.94 kg/m  Filed Weights   06/27/22 0909  Weight: 132 lb (59.9 kg)   Constitutional:  Well-developed, in no acute distress. Psychiatric: Normal mood and affect. Behavior is normal. HEENT: Pupils normal.  Conjunctivae are normal. No scleral icterus. Cardiovascular: Normal rate, regular rhythm. No  edema Pulmonary/chest: Effort normal and breath sounds normal. No wheezing, rales or rhonchi. Abdominal: Soft, nondistended. Nontender. Bowel sounds active throughout. There are no masses palpable. No hepatomegaly. Neurological: Alert and oriented to person place and time. Skin: Skin is warm and dry. No rashes noted.  Data Reviewed: I have personally reviewed following labs and imaging studies  CBC:    Latest Ref Rng & Units 06/02/2022    9:28 AM 12/05/2021    9:54 AM 11/30/2020    9:58 AM  CBC  WBC 4.0 - 10.5 K/uL 4.6  4.7  5.9   Hemoglobin 13.0 - 17.0 g/dL 15.9  16.6  16.6   Hematocrit 39.0 - 52.0 % 45.7  47.7  47.1   Platelets 150.0 - 400.0 K/uL 247.0  139.0  141     CMP:    Latest Ref Rng & Units 06/02/2022    9:28 AM 12/05/2021    9:54 AM 07/06/2020    1:29 PM  CMP  Glucose 70 - 99 mg/dL 85  95  73   BUN 6 - 23 mg/dL '10  10  9   '$ Creatinine 0.40 - 1.50 mg/dL 1.12  1.24  1.06   Sodium 135 - 145 mEq/L 133  135  131   Potassium 3.5 - 5.1 mEq/L 3.7  3.6  3.5   Chloride 96 - 112 mEq/L 92  96  93   CO2 19 - 32 mEq/L 36  35  31   Calcium 8.4 - 10.5 mg/dL 8.5  9.0  9.0   Total Protein 6.0 - 8.3 g/dL 5.7  5.9  6.0   Total Bilirubin 0.2 - 1.2 mg/dL 0.9  1.0  0.8   Alkaline Phos 39 - 117 U/L 57  47  46   AST 0 - 37 U/L '24  25  24   '$ ALT 0 - 53 U/L 15  18  16  I spent 25 minutes of face-to-face time with the patient. Greater than 50% of the time was spent counseling and coordinating care.     Carmell Austria, MD 06/27/2022, 9:14 AM  Cc: Plotnikov, Evie Lacks, MD

## 2022-06-28 ENCOUNTER — Encounter: Payer: Self-pay | Admitting: Internal Medicine

## 2022-06-29 ENCOUNTER — Other Ambulatory Visit: Payer: Self-pay | Admitting: Orthopedic Surgery

## 2022-06-29 DIAGNOSIS — M545 Low back pain, unspecified: Secondary | ICD-10-CM

## 2022-07-04 ENCOUNTER — Other Ambulatory Visit: Payer: Self-pay | Admitting: *Deleted

## 2022-07-04 ENCOUNTER — Ambulatory Visit
Admission: RE | Admit: 2022-07-04 | Discharge: 2022-07-04 | Disposition: A | Discharge status: Home / Self Care | Payer: Medicare Other | Source: Ambulatory Visit | Attending: Orthopedic Surgery | Admitting: Orthopedic Surgery

## 2022-07-04 DIAGNOSIS — M545 Low back pain, unspecified: Secondary | ICD-10-CM

## 2022-07-04 MED ORDER — TADALAFIL 5 MG PO TABS: ORAL_TABLET | ORAL | 1 refills | Status: DC

## 2022-07-04 NOTE — Telephone Encounter (Signed)
Rec'd fax pt needing refill on Tadalafil 5 mg #90 with [facility]. Updated refill and faxed back to [facility].Marland KitchenJohny Chess

## 2022-07-14 ENCOUNTER — Other Ambulatory Visit: Payer: Self-pay | Admitting: Internal Medicine

## 2022-07-19 ENCOUNTER — Other Ambulatory Visit: Payer: Self-pay | Admitting: Internal Medicine

## 2022-07-20 ENCOUNTER — Other Ambulatory Visit: Payer: Self-pay | Admitting: Orthopedic Surgery

## 2022-07-27 ENCOUNTER — Ambulatory Visit (INDEPENDENT_AMBULATORY_CARE_PROVIDER_SITE_OTHER): Payer: Medicare Other

## 2022-07-27 VITALS — Ht 70.0 in | Wt 129.0 lb

## 2022-07-27 DIAGNOSIS — Z0000 Encounter for general adult medical examination without abnormal findings: Secondary | ICD-10-CM | POA: Diagnosis not present

## 2022-07-27 NOTE — Patient Instructions (Signed)
[name] , Thank you for taking time to come for your Medicare Wellness Visit. I appreciate your ongoing commitment to your health goals. Please review the following plan we discussed and let me know if I can assist you in the future.   These are the goals we discussed:  Goals      My goal for 2024 is to continue to exercise and not aggrivate my back.        This is a list of the screening recommended for you and due dates:  Health Maintenance  Topic Date Due   Hepatitis C Screening: USPSTF Recommendation to screen - Ages 54-79 yo.  Never done   Zoster (Shingles) Vaccine (1 of 2) Never done   COVID-19 Vaccine (7 - 2023-24 season) 06/01/2022   Medicare Annual Wellness Visit  07/28/2023   DTaP/Tdap/Td vaccine (7 - Td or Tdap) 12/29/2026   Colon Cancer Screening  08/16/2031   Pneumonia Vaccine  Completed   Flu Shot  Completed   HPV Vaccine  Aged Out    Advanced directives: Yes  Conditions/risks identified: Yes  Next appointment: Follow up in one year for your annual wellness visit.   Preventive Care 73 Years and Older, Male  Preventive care refers to lifestyle choices and visits with your health care provider that can promote health and wellness. What does preventive care include? A yearly physical exam. This is also called an annual well check. Dental exams once or twice a year. Routine eye exams. Ask your health care provider how often you should have your eyes checked. Personal lifestyle choices, including: Daily care of your teeth and gums. Regular physical activity. Eating a healthy diet. Avoiding tobacco and drug use. Limiting alcohol use. Practicing safe sex. Taking low doses of aspirin every day. Taking vitamin and mineral supplements as recommended by your health care provider. What happens during an annual well check? The services and screenings done by your health care provider during your annual well check will depend on your age, overall health, lifestyle  risk factors, and family history of disease. Counseling  Your health care provider may ask you questions about your: Alcohol use. Tobacco use. Drug use. Emotional well-being. Home and relationship well-being. Sexual activity. Eating habits. History of falls. Memory and ability to understand (cognition). Work and work Statistician. Screening  You may have the following tests or measurements: Height, weight, and BMI. Blood pressure. Lipid and cholesterol levels. These may be checked every 5 years, or more frequently if you are over 8 years old. Skin check. Lung cancer screening. You may have this screening every year starting at age 56 if you have a 30-pack-year history of smoking and currently smoke or have quit within the past 15 years. Fecal occult blood test (FOBT) of the stool. You may have this test every year starting at age 39. Flexible sigmoidoscopy or colonoscopy. You may have a sigmoidoscopy every 5 years or a colonoscopy every 10 years starting at age 11. Prostate cancer screening. Recommendations will vary depending on your family history and other risks. Hepatitis C blood test. Hepatitis B blood test. Sexually transmitted disease (STD) testing. Diabetes screening. This is done by checking your blood sugar (glucose) after you have not eaten for a while (fasting). You may have this done every 1-3 years. Abdominal aortic aneurysm (AAA) screening. You may need this if you are a current or former smoker. Osteoporosis. You may be screened starting at age 56 if you are at high risk. Talk with your  health care provider about your test results, treatment options, and if necessary, the need for more tests. Vaccines  Your health care provider may recommend certain vaccines, such as: Influenza vaccine. This is recommended every year. Tetanus, diphtheria, and acellular pertussis (Tdap, Td) vaccine. You may need a Td booster every 10 years. Zoster vaccine. You may need this after age  19. Pneumococcal 13-valent conjugate (PCV13) vaccine. One dose is recommended after age 2. Pneumococcal polysaccharide (PPSV23) vaccine. One dose is recommended after age 50. Talk to your health care provider about which screenings and vaccines you need and how often you need them. This information is not intended to replace advice given to you by your health care provider. Make sure you discuss any questions you have with your health care provider. Document Released: 07/30/2015 Document Revised: 03/22/2016 Document Reviewed: 05/04/2015 Elsevier Interactive Patient Education  2017 [place] Prevention in the Home Falls can cause injuries. They can happen to people of all ages. There are many things you can do to make your home safe and to help prevent falls. What can I do on the outside of my home? Regularly fix the edges of walkways and driveways and fix any cracks. Remove anything that might make you trip as you walk through a door, such as a raised step or threshold. Trim any bushes or trees on the path to your home. Use bright outdoor lighting. Clear any walking paths of anything that might make someone trip, such as rocks or tools. Regularly check to see if handrails are loose or broken. Make sure that both sides of any steps have handrails. Any raised decks and porches should have guardrails on the edges. Have any leaves, snow, or ice cleared regularly. Use sand or salt on walking paths during winter. Clean up any spills in your garage right away. This includes oil or grease spills. What can I do in the bathroom? Use night lights. Install grab bars by the toilet and in the tub and shower. Do not use towel bars as grab bars. Use non-skid mats or decals in the tub or shower. If you need to sit down in the shower, use a plastic, non-slip stool. Keep the floor dry. Clean up any water that spills on the floor as soon as it happens. Remove soap buildup in the tub or shower  regularly. Attach bath mats securely with double-sided non-slip rug tape. Do not have throw rugs and other things on the floor that can make you trip. What can I do in the bedroom? Use night lights. Make sure that you have a light by your bed that is easy to reach. Do not use any sheets or blankets that are too big for your bed. They should not hang down onto the floor. Have a firm chair that has side arms. You can use this for support while you get dressed. Do not have throw rugs and other things on the floor that can make you trip. What can I do in the kitchen? Clean up any spills right away. Avoid walking on wet floors. Keep items that you use a lot in easy-to-reach places. If you need to reach something above you, use a strong step stool that has a grab bar. Keep electrical cords out of the way. Do not use floor polish or wax that makes floors slippery. If you must use wax, use non-skid floor wax. Do not have throw rugs and other things on the floor that can make you trip. What  can I do with my stairs? Do not leave any items on the stairs. Make sure that there are handrails on both sides of the stairs and use them. Fix handrails that are broken or loose. Make sure that handrails are as long as the stairways. Check any carpeting to make sure that it is firmly attached to the stairs. Fix any carpet that is loose or worn. Avoid having throw rugs at the top or bottom of the stairs. If you do have throw rugs, attach them to the floor with carpet tape. Make sure that you have a light switch at the top of the stairs and the bottom of the stairs. If you do not have them, ask someone to add them for you. What else can I do to help prevent falls? Wear shoes that: Do not have high heels. Have rubber bottoms. Are comfortable and fit you well. Are closed at the toe. Do not wear sandals. If you use a stepladder: Make sure that it is fully opened. Do not climb a closed stepladder. Make sure that  both sides of the stepladder are locked into place. Ask someone to hold it for you, if possible. Clearly mark and make sure that you can see: Any grab bars or handrails. First and last steps. Where the edge of each step is. Use tools that help you move around (mobility aids) if they are needed. These include: Canes. Walkers. Scooters. Crutches. Turn on the lights when you go into a dark area. Replace any light bulbs as soon as they burn out. Set up your furniture so you have a clear path. Avoid moving your furniture around. If any of your floors are uneven, fix them. If there are any pets around you, be aware of where they are. Review your medicines with your doctor. Some medicines can make you feel dizzy. This can increase your chance of falling. Ask your doctor what other things that you can do to help prevent falls. This information is not intended to replace advice given to you by your health care provider. Make sure you discuss any questions you have with your health care provider. Document Released: 04/29/2009 Document Revised: 12/09/2015 Document Reviewed: 08/07/2014 Elsevier Interactive Patient Education  2017 [employer].

## 2022-07-27 NOTE — Progress Notes (Cosign Needed Addendum)
Virtual Visit via Telephone Note  I connected with  Troy Marsh on 07/27/22 at  1:00 PM EST by telephone and verified that I am speaking with the correct person using two identifiers.  Location: Patient: Home Provider: [place] Persons participating in the virtual visit: [place]   I discussed the limitations, risks, security and privacy concerns of performing an evaluation and management service by telephone and the availability of in person appointments. The patient expressed understanding and agreed to proceed.  Interactive audio and video telecommunications were attempted between this nurse and patient, however failed, due to patient having technical difficulties OR patient did not have access to video capability.  We continued and completed visit with audio only.  Some vital signs may be absent or patient reported.   Sheral Flow, LPN  Subjective:   LENOARD Marsh is a 72 y.o. male who presents for Medicare Annual/Subsequent preventive examination.  Review of Systems     Cardiac Risk Factors include: advanced age (>24mn, >>75women);family history of premature cardiovascular disease;male gender;hypertension     Objective:    Today's Vitals   07/27/22 1302 07/27/22 1304  Weight: 129 lb (58.5 kg)   Height: '5\' 10"'$  (1.778 m)   PainSc: 8  8   PainLoc: Back    Body mass index is 18.51 kg/m.     07/27/2022    1:05 PM 05/28/2022    9:44 AM 07/19/2021   11:06 AM 07/01/2021    2:46 PM 12/02/2020    1:27 PM 07/06/2020   12:51 PM 05/18/2020    4:26 PM  Advanced Directives  Does Patient Have a Medical Advance Directive? Yes No Yes Yes Yes Yes Yes  Type of AParamedicof [place]Living will  Living will;Healthcare Power of Attorney Living will HFairpl[place]Living will  [place] will  Does patient want to make changes to medical advance directive?    No - Patient declined  No - Patient declined  No - Patient declined  Copy of [place] Chart? No - copy requested    No - copy requested  No - copy requested    Current Medications (verified) Outpatient Encounter Medications as of 07/27/2022  Medication Sig   cyclobenzaprine (FLEXERIL) 5 MG tablet Take 5-10 mg by mouth at bedtime as needed.   alprostadil (CAVERJECT IMPULSE) 20 MCG injection INJECT 20 MCG BY INTRACAVITARY  ROUTE AS DIRECTED AS NEEDED FOR  ERECTILE DYSFUNCTION. USE NO  MORE THAN 3 TIMES WEEKLY   AMBULATORY NON FORMULARY MEDICATION Medication Name: Diltiazem gel 2% Apply a peasize amount 2 times a day for 4 week   aspirin 81 MG tablet Take 81 mg by mouth daily.   atorvastatin (LIPITOR) 40 MG tablet Take 40 mg by mouth at bedtime.    B-D BLUNT FILL NEEDLE 18G X 1-1/2" MISC USE AS INSTRUCTED   baclofen (LIORESAL) 20 MG tablet Take 1 tablet (20 mg total) by mouth 3 (three) times daily.   BD DISP NEEDLES 21G X 1-1/2" MISC USE AS DIRECTED   BELSOMRA 10 MG TABS TAKE 1 TABLET BY MOUTH AT  BEDTIME AS NEEDED   celecoxib (CELEBREX) 200 MG capsule Take 1 capsule (200 mg total) by mouth 2 (two) times daily. Annual appt is due must see provider for future refills   cetirizine-pseudoephedrine (ZYRTEC-D) 5-120 MG tablet Take 1 tablet by mouth daily as needed for allergies.    diltiazem 2 % GEL Diltiazem  gel 2% apply rectally twice daily for 4 weeks   emtricitabine-rilpivir-tenofovir AF (ODEFSEY) 200-25-25 MG TABS tablet Take 1 tablet by mouth daily.   gabapentin (NEURONTIN) 300 MG capsule Take 1 capsule by mouth 3 (three) times daily.   hydrochlorothiazide (HYDRODIURIL) 50 MG tablet Take 1 tablet (50 mg total) by mouth daily.   HYDROcodone-acetaminophen (NORCO) 7.5-325 MG tablet Take 1 tablet by mouth every 6 (six) hours as needed for severe pain.   HYDROcodone-acetaminophen (NORCO) 7.5-325 MG tablet Take 1 tablet by mouth every 6 (six) hours as needed for severe pain.    HYDROcodone-acetaminophen (NORCO) 7.5-325 MG tablet Take 1 tablet by mouth every 6 (six) hours as needed for severe pain.   hydrocortisone (ANUSOL-HC) 25 MG suppository UNWRAP AND INSERT 1  SUPPOSITORY RECTALLY TWICE  DAILY   hydrocortisone (ANUSOL-HC) 25 MG suppository Place 1 suppository (25 mg total) rectally 2 (two) times daily. Use for 10 days after a BM and then use as needed   linaclotide (LINZESS) 290 MCG CAPS capsule Take 1 capsule (290 mcg total) by mouth daily before breakfast.   linaclotide (LINZESS) 290 MCG CAPS capsule Take 1 capsule (290 mcg total) by mouth daily before breakfast.   Melatonin 10 MG TABS Take 10 mg by mouth at bedtime as needed (for sleep).    methocarbamol (ROBAXIN) 500 MG tablet Take 1 tablet by mouth as needed.   mirabegron ER (MYRBETRIQ) 25 MG TB24 tablet Take 25 mg by mouth as needed (2 TIMES A WEEK).   montelukast (SINGULAIR) 10 MG tablet Take 1 tablet (10 mg total) by mouth daily.   Multiple Vitamin (MULTIVITAMIN WITH MINERALS) TABS tablet Take 1 tablet by mouth daily. COMPLETE MULTIVITAMIN MEN'S 50+   polyethylene glycol powder (GLYCOLAX/MIRALAX) powder Take 17 g by mouth daily as needed for moderate constipation.    potassium chloride (KLOR-CON M) 10 MEQ tablet TAKE 1 TABLET BY MOUTH  DAILY   RESTASIS 0.05 % ophthalmic emulsion Place 1 drop into both eyes 2 (two) times daily.   tadalafil (CIALIS) 5 MG tablet TAKE 1 TABLET EVERY DAY   testosterone enanthate (DELATESTRYL) 200 MG/ML injection Inject 1 mL (200 mg total) into the muscle every 14 (fourteen) days.   No facility-administered encounter medications on file as of 07/27/2022.    Allergies (verified) Patient has no known allergies.   History: Past Medical History:  Diagnosis Date   Arthritis    BPH (benign prostatic hyperplasia)    [facility]  DR. Elie Goody   Cancer [facility])    Skin cancer- basal and squamous   Constipation due to pain medication    HIV disease ([place]) 1985   last CD4  10/02/12 was 277   Hyperlipidemia    Hypertension    Hypogonadism male    Past Surgical History:  Procedure Laterality Date   ANTERIOR CERVICAL DECOMP/DISCECTOMY FUSION Bilateral 11/22/2017   Procedure: ANTERIOR CERVICAL DECOMPRESSION FUSION CERVICAL 4-5, CERVICAL 5-6, CERVICAL 6-7 WITH INSTRUMENTATION AND ALLOGRAFT TIME      /   REQUESTED 4 HOURS;  Surgeon: Phylliss Bob, MD;  Location: [place];  Service: Orthopedics;  Laterality: Bilateral;   BACK SURGERY     COLONOSCOPY W/ POLYPECTOMY     EYE SURGERY Bilateral    Lasik, Cataract   HEMORRHOID SURGERY  1973   INGUINAL HERNIA REPAIR Right 06/11/2018   Procedure: LAPAROSCOPIC RIGHT INGUINAL HERNIA WITH MESH;  Surgeon: Ralene Ok, MD;  Location: [place];  Service: General;  Laterality: Right;   INSERTION OF MESH Right 06/11/2018  Procedure: INSERTION OF MESH;  Surgeon: Ralene Ok, MD;  Location: [place];  Service: General;  Laterality: Right;   KNEE ARTHROSCOPY Right 2009   KNEE ARTHROSCOPY Left 2011   neck surgery  1984   POSTERIOR CERVICAL FUSION/FORAMINOTOMY N/A 05/29/2018   Procedure: POSTERIOR CERVICAL DECOMPRESSION FUSION CERVICAL 4 - CERVICAL 7 WITH INSTRUMENTATION AND ALLOGRAFT;  Surgeon: Phylliss Bob, MD;  Location: [place];  Service: Orthopedics;  Laterality: N/A;   Family History  Problem Relation Age of Onset   Coronary artery disease Father    Heart attack Father    Hypertension Father    Hyperlipidemia Father    Cancer Father        prostate   Colitis Maternal Grandmother    Diabetes Neg Hx    Colon cancer Neg Hx    Rectal cancer Neg Hx    Stomach cancer Neg Hx    Esophageal cancer Neg Hx    Social History   Socioeconomic History   Marital status: Married    Spouse name: [name] (partner)   Number of children: Not on file   Years of education: 16   Highest education level: Not on file  Occupational History   Occupation: retired    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 Work - [place] [employer] - retired 2009. Long term monogamous relationship. Lives in his own home.             Social Determinants of Health   Financial Resource Strain: Low Risk  (07/27/2022)   Overall Financial Resource Strain (CARDIA)    Difficulty of Paying Living Expenses: Not hard at all  Food Insecurity: No Food Insecurity (07/27/2022)   Hunger Vital Sign    Worried About Running Out of Food in the Last Year: Never true    Ran Out of Food in the Last Year: Never true  Transportation Needs: No Transportation Needs (07/27/2022)   PRAPARE - Hydrologist (Medical): No    Lack of Transportation (Non-Medical): No  Physical Activity: Sufficiently Active (07/27/2022)   Exercise Vital Sign    Days of Exercise per Week: 7 days    Minutes of Exercise per Session: 30 min  Stress: No Stress Concern Present (07/27/2022)   [place]    Feeling of Stress : Not at all  Social Connections: Unknown (07/27/2022)   Social Connection and Isolation Panel [NHANES]    Frequency of Communication with Friends and Family: Once a week    Frequency of Social Gatherings with Friends and Family: Patient refused    Attends Religious Services: More than 4 times per year    Active Member of Clubs or Organizations: Yes    Attends  Club or Organization Meetings: 1 to  4 times per year    Marital Status: Married    Tobacco Counseling Counseling given: Not Answered   Clinical Intake:  Pre-visit preparation completed: Yes  Pain : 0-10 Pain Score: 8  Pain Type: Chronic pain Pain Location: Back     BMI - recorded: 18.51 Nutritional Status: BMI <19  Underweight Nutritional Risks: None Diabetes: No  How often do you need to have someone help you when you read instructions, pamphlets, or other written materials from your doctor or pharmacy?: 1 - Never What is the last grade level you completed in school?: HSG  Diabetic? No  Interpreter Needed?: No  Information entered by :: Lisette Abu, LPN.   Activities of Daily Living    07/27/2022    1:09 PM 07/03/2022    9:29 AM  In your present state of health, do you have any difficulty performing the following activities:  Hearing? 0 0  Vision? 0 0  Difficulty concentrating or making decisions? 0 0  Walking or climbing stairs? 0 0  Dressing or bathing? 0 0  Doing errands, shopping? 0 0  Preparing Food and eating ? N N  Using the Toilet? N N  In the past six months, have you accidently leaked urine? Y Y  Do you have problems with loss of bowel control? N N  Managing your Medications? N N  Managing your Finances? N N  Housekeeping or managing your Housekeeping? N N    Patient Care Team: Plotnikov, Evie Lacks, MD as PCP - General (Internal Medicine) Debby Freiberg, MD as Referring Physician (Infectious Diseases) Phylliss Bob, MD as Consulting Physician (Orthopedic Surgery) Festus Aloe, MD as Consulting Physician (Urology) Jackquline Denmark, MD as Consulting Physician (Gastroenterology) Alanda Slim, Neena Rhymes, MD as Consulting Physician (Ophthalmology) Laroy Apple, MD as Referring Physician (Physical Medicine and Rehabilitation)  Indicate any recent Medical Services you may have received from other than [facility] providers in the past year (date may be approximate).     Assessment:    This is a routine wellness examination for Karlo.  Hearing/Vision screen Hearing Screening - Comments:: Denies hearing difficulties   Vision Screening - Comments:: No eyeglasses needed. Eye exam done by Julian Reil, MD.  Dietary issues and exercise activities discussed: Current Exercise Habits: Home exercise routine;Structured exercise class, Type of exercise: walking;treadmill;stretching;strength training/weights;exercise ball;calisthenics, Time (Minutes): 60, Frequency (Times/Week): 7, Weekly Exercise (Minutes/Week): 420, Intensity: Moderate, Exercise limited by: orthopedic condition(s) (spinal stenosis)   Goals Addressed             This Visit's Progress    My goal for 2024 is to continue to exercise and not aggrivate my back.        Depression Screen    07/27/2022    1:12 PM 11/16/2021    8:06 AM 05/18/2021    8:21 AM 05/18/2020    4:25 PM 02/10/2020   11:02 AM 04/24/2017    9:42 AM 10/23/2016    9:27 AM  PHQ 2/9 Scores  PHQ - 2 Score 0 0 0 0 0 0 0  PHQ- 9 Score  1    0     Fall Risk    07/27/2022    1:09 PM 07/03/2022    9:29 AM 11/16/2021    8:07 AM 05/18/2021    8:21 AM 05/18/2020    4:27 PM  [place] in the past year? 1 1 0 0 0  Number falls in past yr: 0 0  0 0 0  Injury with Fall? 1 1 0 0 0  Risk for fall due to :   No Fall Risks  No Fall Risks  Follow up     Falls evaluation completed    FALL RISK PREVENTION PERTAINING TO THE HOME:  Any stairs in or around the home? Yes  If so, are there any without handrails? No  Home free of loose throw rugs in walkways, pet beds, electrical cords, etc? Yes  Adequate lighting in your home to reduce risk of falls? Yes   ASSISTIVE DEVICES UTILIZED TO PREVENT FALLS:  Life alert? No  Use of a cane, walker or w/c? No  Grab bars in the bathroom? Yes  Shower chair or bench in shower? Yes  Elevated toilet seat or a handicapped toilet? Yes   TIMED UP AND GO:  Was the test performed? No . Phone Visit  Cognitive  Function:    04/24/2017    9:47 AM  MMSE - Mini Mental State Exam  Orientation to time 5  Orientation to Place 5  Registration 3  Attention/ Calculation 5  Recall 2  Language- name 2 objects 2  Language- repeat 1  Language- follow 3 step command 3  Language- read & follow direction 1  Write a sentence 1  Copy design 1  Total score 29        07/27/2022    1:10 PM  6CIT Screen  What Year? 0 points  What month? 0 points  What time? 0 points  Count back from 20 0 points  Months in reverse 0 points  Repeat phrase 0 points  Total Score 0 points    Immunizations Immunization History  Administered Date(s) Administered   Fluad Quad(high Dose 65+) 04/06/2022   Influenza Split 04/20/2011   Influenza, High Dose Seasonal PF 04/24/2016, 04/24/2016, 04/24/2017, 04/24/2017, 04/24/2018, 04/24/2018, 04/22/2019, 05/12/2021   Influenza,inj,Quad PF,6+ Mos 03/30/2014, 04/12/2015   Influenza,inj,quad, With Preservative 05/12/2021   Influenza-Unspecified 03/21/2013, 04/22/2019, 05/04/2019, 03/31/2020   PFIZER(Purple Top)SARS-COV-2 Vaccination 09/09/2019, 09/30/2019, 03/31/2020, 10/29/2020, 05/12/2021   PPD Test 07/22/2007, 11/02/2008, 09/20/2009, 02/13/2011   Pneumococcal Conjugate-13 09/30/2012   Pneumococcal Polysaccharide-23 12/05/2004, 09/20/2009, 04/24/2016   Respiratory Syncytial Virus Vaccine,Recomb Aduvanted(Arexvy) 06/28/2022   Td 12/18/2008, 04/12/2009   Td (Adult), 2 Lf Tetanus Toxid, Preservative Free 12/18/2008, 04/12/2009   Tdap 06/25/2006, 12/28/2016   Unspecified SARS-COV-2 Vaccination 04/06/2022   Zoster, Live 07/22/2011, 04/12/2015    TDAP status: Up to date  Flu Vaccine status: Up to date  Pneumococcal vaccine status: Up to date  Covid-19 vaccine status: Completed vaccines  Qualifies for Shingles Vaccine? Yes   Zostavax completed Yes   Shingrix Completed?: No.    Education has been provided regarding the importance of this vaccine. Patient has been advised to  call insurance company to determine out of pocket expense if they have not yet received this vaccine. Advised may also receive vaccine at local pharmacy or Health Dept. Verbalized acceptance and understanding.  Screening Tests Health Maintenance  Topic Date Due   Hepatitis C Screening  Never done   Zoster Vaccines- Shingrix (1 of 2) Never done   COVID-19 Vaccine (7 - 2023-24 season) 06/01/2022   Medicare Annual Wellness (AWV)  07/28/2023   DTaP/Tdap/Td (7 - Td or Tdap) 12/29/2026   COLONOSCOPY (Pts 45-71yr Insurance coverage will need to be confirmed)  08/16/2031   Pneumonia Vaccine 72 Years old  Completed   INFLUENZA VACCINE  Completed   HPV VACCINES  Aged Out  Health Maintenance  Health Maintenance Due  Topic Date Due   Hepatitis C Screening  Never done   Zoster Vaccines- Shingrix (1 of 2) Never done   COVID-19 Vaccine (7 - 2023-24 season) 06/01/2022    Colorectal cancer screening: Type of screening: Colonoscopy. Completed 08/15/2021. Repeat every 10 years  Lung Cancer Screening: (Low Dose CT Chest recommended if Age 29-80 years, 30 pack-year currently smoking OR have quit w/in 15years.) does not qualify.   Lung Cancer Screening Referral: no  Additional Screening:  Hepatitis C Screening: does qualify; Completed 0  Vision Screening: Recommended annual ophthalmology exams for early detection of glaucoma and other disorders of the eye. Is the patient up to date with their annual eye exam?  Yes  Who is the provider or what is the name of the office in which the patient attends annual eye exams? Julian Reil, MD. If pt is not established with a provider, would they like to be referred to a provider to establish care? No .   Dental Screening: Recommended annual dental exams for proper oral hygiene  Community Resource Referral / Chronic Care Management: CRR required this visit?  No   CCM required this visit?  No      Plan:     I have personally reviewed and noted  the following in the patient's chart:   Medical and social history Use of alcohol, tobacco or illicit drugs  Current medications and supplements including opioid prescriptions. Patient is currently taking opioid prescriptions. Information provided to patient regarding non-opioid alternatives. Patient advised to discuss non-opioid treatment plan with their provider. Functional ability and status Nutritional status Physical activity Advanced directives List of other physicians Hospitalizations, surgeries, and ER visits in previous 12 months Vitals Screenings to include cognitive, depression, and falls Referrals and appointments  In addition, I have reviewed and discussed with patient certain preventive protocols, quality metrics, and best practice recommendations. A written personalized care plan for preventive services as well as general preventive health recommendations were provided to patient.     Sheral Flow, LPN   12/28/4313   Nurse Notes: N/A  Medical screening examination/treatment/procedure(s) were performed by non-physician practitioner and as supervising physician I was immediately available for consultation/collaboration.  I agree with above. Lew Dawes, MD

## 2022-08-16 ENCOUNTER — Inpatient Hospital Stay: Admit: 2022-08-16 | Payer: Medicare Other | Admitting: Orthopedic Surgery

## 2022-08-16 SURGERY — ANTERIOR LATERAL LUMBAR FUSION 2 LEVELS
Anesthesia: General | Laterality: Left

## 2022-08-17 ENCOUNTER — Inpatient Hospital Stay: Admit: 2022-08-17 | Payer: Medicare Other | Admitting: Orthopedic Surgery

## 2022-08-17 SURGERY — LUMBAR LAMINECTOMY/DECOMPRESSION MICRODISCECTOMY
Anesthesia: General

## 2022-10-15 ENCOUNTER — Encounter: Payer: Self-pay | Admitting: Internal Medicine

## 2022-10-18 ENCOUNTER — Other Ambulatory Visit: Payer: Self-pay | Admitting: *Deleted

## 2022-10-18 ENCOUNTER — Telehealth: Payer: Self-pay | Admitting: Internal Medicine

## 2022-10-18 MED ORDER — TESTOSTERONE ENANTHATE 200 MG/ML IM SOLN: 200.0000 mg | INTRAMUSCULAR | 4 refills | Status: DC

## 2022-10-18 MED ORDER — TADALAFIL 5 MG PO TABS: ORAL_TABLET | ORAL | 1 refills | Status: DC

## 2022-10-18 NOTE — Telephone Encounter (Signed)
Okay.  Thanks.

## 2022-10-18 NOTE — Telephone Encounter (Signed)
Also rec'd fax for renewal on Tadalafil 5 mg # 90. Faxed approval back manually.Marland KitchenJohny Chess

## 2022-10-18 NOTE — Telephone Encounter (Signed)
Prescription Request  10/18/2022  LOV: 05/22/2022  What is the name of the medication or equipment?   testosterone enanthate (DELATESTRYL) 200 MG/ML injection   Have you contacted your pharmacy to request a refill? Yes   Which pharmacy would you like this sent to?   [place], [place] [address] [place] [address] Phone: [phone] Fax: [phone]    Patient notified that their request is being sent to the clinical staff for review and that they should receive a response within 2 business days.   Please advise at [place]

## 2022-10-19 NOTE — Telephone Encounter (Signed)
MD sent refill on Testosterone.See previous msg../lmb

## 2022-10-27 ENCOUNTER — Encounter: Payer: Self-pay | Admitting: Internal Medicine

## 2022-11-01 ENCOUNTER — Other Ambulatory Visit: Payer: Self-pay | Admitting: Internal Medicine

## 2022-11-01 DIAGNOSIS — G8929 Other chronic pain: Secondary | ICD-10-CM

## 2022-11-14 ENCOUNTER — Encounter: Payer: Self-pay | Admitting: Internal Medicine

## 2022-11-20 ENCOUNTER — Encounter: Payer: Self-pay | Admitting: Internal Medicine

## 2022-11-20 ENCOUNTER — Ambulatory Visit (INDEPENDENT_AMBULATORY_CARE_PROVIDER_SITE_OTHER): Payer: Medicare Other | Admitting: Internal Medicine

## 2022-11-20 VITALS — BP 134/76 | HR 55 | Temp 97.8°F | Ht 70.0 in | Wt 134.0 lb

## 2022-11-20 DIAGNOSIS — Z86010 Personal history of colonic polyps: Secondary | ICD-10-CM

## 2022-11-20 DIAGNOSIS — Z23 Encounter for immunization: Secondary | ICD-10-CM | POA: Diagnosis not present

## 2022-11-20 DIAGNOSIS — G8929 Other chronic pain: Secondary | ICD-10-CM

## 2022-11-20 DIAGNOSIS — M545 Low back pain, unspecified: Secondary | ICD-10-CM

## 2022-11-20 DIAGNOSIS — Z0000 Encounter for general adult medical examination without abnormal findings: Secondary | ICD-10-CM | POA: Diagnosis not present

## 2022-11-20 DIAGNOSIS — F5101 Primary insomnia: Secondary | ICD-10-CM

## 2022-11-20 DIAGNOSIS — D751 Secondary polycythemia: Secondary | ICD-10-CM | POA: Diagnosis not present

## 2022-11-20 DIAGNOSIS — E291 Testicular hypofunction: Secondary | ICD-10-CM | POA: Diagnosis not present

## 2022-11-20 DIAGNOSIS — R972 Elevated prostate specific antigen [PSA]: Secondary | ICD-10-CM | POA: Diagnosis not present

## 2022-11-20 LAB — COMPREHENSIVE METABOLIC PANEL
ALT: 14 U/L (ref 0–53)
AST: 24 U/L (ref 0–37)
Albumin: 4 g/dL (ref 3.5–5.2)
Alkaline Phosphatase: 49 U/L (ref 39–117)
BUN: 9 mg/dL (ref 6–23)
CO2: 34 mEq/L — ABNORMAL HIGH (ref 19–32)
Calcium: 9.5 mg/dL (ref 8.4–10.5)
Chloride: 92 mEq/L — ABNORMAL LOW (ref 96–112)
Creatinine, Ser: 1.26 mg/dL (ref 0.40–1.50)
GFR: 56.86 mL/min — ABNORMAL LOW (ref >60.00)
Glucose, Bld: 93 mg/dL (ref 70–99)
Potassium: 4.3 mEq/L (ref 3.5–5.1)
Sodium: 135 mEq/L (ref 135–145)
Total Bilirubin: 0.9 mg/dL (ref 0.2–1.2)
Total Protein: 6 g/dL (ref 6.0–8.3)

## 2022-11-20 LAB — CBC WITH DIFFERENTIAL/PLATELET
Basophils Absolute: 0 10*3/uL (ref 0.0–0.1)
Basophils Relative: 0.7 % (ref 0.0–3.0)
Eosinophils Absolute: 0 10*3/uL (ref 0.0–0.7)
Eosinophils Relative: 0.5 % (ref 0.0–5.0)
HCT: 47.3 % (ref 39.0–52.0)
Hemoglobin: 16.4 g/dL (ref 13.0–17.0)
Lymphocytes Relative: 25.1 % (ref 12.0–46.0)
Lymphs Abs: 1.2 10*3/uL (ref 0.7–4.0)
MCHC: 34.6 g/dL (ref 30.0–36.0)
MCV: 92.8 fl (ref 78.0–100.0)
Monocytes Absolute: 0.5 10*3/uL (ref 0.1–1.0)
Monocytes Relative: 11.6 % (ref 3.0–12.0)
Neutro Abs: 2.9 10*3/uL (ref 1.4–7.7)
Neutrophils Relative %: 62.1 % (ref 43.0–77.0)
Platelets: 217 10*3/uL (ref 150.0–400.0)
RBC: 5.1 Mil/uL (ref 4.22–5.81)
RDW: 13.9 % (ref 11.5–15.5)
WBC: 4.7 10*3/uL (ref 4.0–10.5)

## 2022-11-20 LAB — URINALYSIS
Bilirubin Urine: NEGATIVE
Hgb urine dipstick: NEGATIVE
Ketones, ur: NEGATIVE
Leukocytes,Ua: NEGATIVE
Nitrite: NEGATIVE
Specific Gravity, Urine: 1.01 (ref 1.000–1.030)
Total Protein, Urine: NEGATIVE
Urine Glucose: NEGATIVE
Urobilinogen, UA: 0.2 (ref 0.0–1.0)
pH: 7.5 (ref 5.0–8.0)

## 2022-11-20 LAB — LIPID PANEL
Cholesterol: 125 mg/dL (ref 0–200)
HDL: 64.8 mg/dL (ref >39.00)
LDL Cholesterol: 51 mg/dL (ref 0–99)
NonHDL: 60.15
Total CHOL/HDL Ratio: 2
Triglycerides: 46 mg/dL (ref 0.0–149.0)
VLDL: 9.2 mg/dL (ref 0.0–40.0)

## 2022-11-20 LAB — PSA: PSA: 1.39 ng/mL (ref 0.10–4.00)

## 2022-11-20 LAB — TSH: TSH: 3.14 u[IU]/mL (ref 0.35–5.50)

## 2022-11-20 LAB — TESTOSTERONE: Testosterone: 1593.56 ng/dL — ABNORMAL HIGH (ref 300.00–890.00)

## 2022-11-20 NOTE — Progress Notes (Signed)
Subjective:  Patient ID: Troy Marsh, male    DOB: 1951/03/24  Age: 72 y.o. MRN: [id number]  CC: Annual Exam   HPI TANNER TOVAR presents for a well exam C/o LBP - unable to walk now - swimming Surgery is planned  Outpatient Medications Prior to Visit  Medication Sig Dispense Refill   alprostadil (CAVERJECT IMPULSE) 20 MCG injection INJECT 20 MCG BY INTRACAVITARY  ROUTE AS DIRECTED AS NEEDED FOR  ERECTILE DYSFUNCTION. USE NO  MORE THAN 3 TIMES WEEKLY 9 each 3   AMBULATORY NON FORMULARY MEDICATION Medication Name: Diltiazem gel 2% Apply a peasize amount 2 times a day for 4 week 30 g 6   aspirin 81 MG tablet Take 81 mg by mouth daily.     B-D BLUNT FILL NEEDLE 18G X 1-1/2" MISC USE AS INSTRUCTED 6 each 1   baclofen (LIORESAL) 20 MG tablet Take 1 tablet (20 mg total) by mouth 3 (three) times daily. 270 each 3   BD DISP NEEDLES 21G X 1-1/2" MISC USE AS DIRECTED 100 each 0   BELSOMRA 10 MG TABS TAKE 1 TABLET BY MOUTH AT  BEDTIME AS NEEDED 90 tablet 1   celecoxib (CELEBREX) 200 MG capsule Take 1 capsule (200 mg total) by mouth 2 (two) times daily. Annual appt is due must see provider for future refills 180 capsule 0   cetirizine-pseudoephedrine (ZYRTEC-D) 5-120 MG tablet Take 1 tablet by mouth daily as needed for allergies.      cyclobenzaprine (FLEXERIL) 5 MG tablet Take 5-10 mg by mouth at bedtime as needed.     diltiazem 2 % GEL Diltiazem gel 2% apply rectally twice daily for 4 weeks 30 g 6   emtricitabine-rilpivir-tenofovir AF (ODEFSEY) 200-25-25 MG TABS tablet Take 1 tablet by mouth daily.     gabapentin (NEURONTIN) 300 MG capsule Take 1 capsule by mouth 3 (three) times daily.     hydrochlorothiazide (HYDRODIURIL) 50 MG tablet Take 1 tablet (50 mg total) by mouth daily. 90 tablet 3   HYDROcodone-acetaminophen (NORCO) 7.5-325 MG tablet Take 1 tablet by mouth every 6 (six) hours as needed for severe pain. 120 tablet 0   HYDROcodone-acetaminophen (NORCO) 7.5-325 MG tablet Take 1  tablet by mouth every 6 (six) hours as needed for severe pain. 120 tablet 0   HYDROcodone-acetaminophen (NORCO) 7.5-325 MG tablet Take 1 tablet by mouth every 6 (six) hours as needed for severe pain. 120 tablet 0   hydrocortisone (ANUSOL-HC) 25 MG suppository Place 1 suppository (25 mg total) rectally 2 (two) times daily. Use for 10 days after a BM and then use as needed 180 suppository 5   linaclotide (LINZESS) 290 MCG CAPS capsule Take 1 capsule (290 mcg total) by mouth daily before breakfast. 90 capsule 4   Melatonin 10 MG TABS Take 10 mg by mouth at bedtime as needed (for sleep).      methocarbamol (ROBAXIN) 500 MG tablet Take 1 tablet by mouth as needed.     mirabegron ER (MYRBETRIQ) 25 MG TB24 tablet Take 25 mg by mouth as needed (2 TIMES A WEEK).     montelukast (SINGULAIR) 10 MG tablet Take 1 tablet (10 mg total) by mouth daily. 90 tablet 3   Multiple Vitamin (MULTIVITAMIN WITH MINERALS) TABS tablet Take 1 tablet by mouth daily. COMPLETE MULTIVITAMIN MEN'S 50+     polyethylene glycol powder (GLYCOLAX/MIRALAX) powder Take 17 g by mouth daily as needed for moderate constipation.      potassium chloride (KLOR-CON M) 10  MEQ tablet TAKE 1 TABLET BY MOUTH  DAILY 90 tablet 3   RESTASIS 0.05 % ophthalmic emulsion Place 1 drop into both eyes 2 (two) times daily.     tadalafil (CIALIS) 5 MG tablet TAKE 1 TABLET EVERY DAY 90 tablet 1   testosterone enanthate (DELATESTRYL) 200 MG/ML injection Inject 1 mL (200 mg total) into the muscle every 14 (fourteen) days. 5 mL 4   atorvastatin (LIPITOR) 40 MG tablet Take 40 mg by mouth at bedtime.      hydrocortisone (ANUSOL-HC) 25 MG suppository UNWRAP AND INSERT 1  SUPPOSITORY RECTALLY TWICE  DAILY 180 suppository 0   linaclotide (LINZESS) 290 MCG CAPS capsule Take 1 capsule (290 mcg total) by mouth daily before breakfast. 90 capsule 4   No facility-administered medications prior to visit.    ROS: Review of Systems  Constitutional:  Negative for appetite  change, fatigue and unexpected weight change.  HENT:  Negative for congestion, nosebleeds, sneezing, sore throat and trouble swallowing.   Eyes:  Negative for itching and visual disturbance.  Respiratory:  Negative for cough.   Cardiovascular:  Negative for chest pain, palpitations and leg swelling.  Gastrointestinal:  Negative for abdominal distention, blood in stool, diarrhea and nausea.  Genitourinary:  Negative for frequency and hematuria.  Musculoskeletal:  Positive for back pain. Negative for gait problem, joint swelling and neck pain.  Skin:  Negative for rash.  Neurological:  Negative for dizziness, tremors, speech difficulty and weakness.  Psychiatric/Behavioral:  Negative for agitation, dysphoric mood and sleep disturbance. The patient is not nervous/anxious.     Objective:  BP 134/76 (BP Location: Left Arm, Patient Position: Sitting, Cuff Size: Normal)   Pulse (!) 55   Temp 97.8 F (36.6 C) (Temporal)   Ht 5\' 10"  (1.778 m)   Wt 134 lb (60.8 kg)   SpO2 100%   BMI 19.23 kg/m   BP Readings from Last 3 Encounters:  11/20/22 134/76  06/27/22 138/82  05/28/22 (!) 152/90    Wt Readings from Last 3 Encounters:  11/20/22 134 lb (60.8 kg)  07/27/22 129 lb (58.5 kg)  06/27/22 132 lb (59.9 kg)    Physical Exam Constitutional:      General: He is not in acute distress.    Appearance: Normal appearance. He is well-developed.     Comments: NAD  Eyes:     Conjunctiva/sclera: Conjunctivae normal.     Pupils: Pupils are equal, round, and reactive to light.  Neck:     Thyroid: No thyromegaly.     Vascular: No JVD.  Cardiovascular:     Rate and Rhythm: Normal rate and regular rhythm.     Heart sounds: Normal heart sounds. No murmur heard.    No friction rub. No gallop.  Pulmonary:     Effort: Pulmonary effort is normal. No respiratory distress.     Breath sounds: Normal breath sounds. No wheezing or rales.  Chest:     Chest wall: No tenderness.  Abdominal:     General:  Bowel sounds are normal. There is no distension.     Palpations: Abdomen is soft. There is no mass.     Tenderness: There is no abdominal tenderness. There is no guarding or rebound.  Musculoskeletal:        General: Tenderness present. Normal range of motion.     Cervical back: Normal range of motion.  Lymphadenopathy:     Cervical: No cervical adenopathy.  Skin:    General: Skin is warm and dry.  Findings: No rash.  Neurological:     Mental Status: He is alert and oriented to person, place, and time.     Cranial Nerves: No cranial nerve deficit.     Motor: No abnormal muscle tone.     Coordination: Coordination normal.     Gait: Gait normal.     Deep Tendon Reflexes: Reflexes are normal and symmetric.  Psychiatric:        Behavior: Behavior normal.        Thought Content: Thought content normal.        Judgment: Judgment normal.   LS w/pain Rectal - per Urology  Lab Results  Component Value Date   WBC 4.6 06/02/2022   HGB 15.9 06/02/2022   HCT 45.7 06/02/2022   PLT 247.0 06/02/2022   GLUCOSE 85 06/02/2022   CHOL 128 06/02/2022   TRIG 39.0 06/02/2022   HDL 73.30 06/02/2022   LDLDIRECT 113.3 05/21/2006   LDLCALC 47 06/02/2022   ALT 15 06/02/2022   AST 24 06/02/2022   NA 133 (L) 06/02/2022   K 3.7 06/02/2022   CL 92 (L) 06/02/2022   CREATININE 1.12 06/02/2022   BUN 10 06/02/2022   CO2 36 (H) 06/02/2022   TSH 4.47 06/02/2022   PSA 1.56 06/02/2022   INR 1.0 07/06/2020    MR LUMBAR SPINE WO CONTRAST  Result Date: 07/05/2022 CLINICAL DATA:  Low back pain, radiates to left hip EXAM: MRI LUMBAR SPINE WITHOUT CONTRAST TECHNIQUE: Multiplanar, multisequence MR imaging of the lumbar spine was performed. No intravenous contrast was administered. COMPARISON:  01/22/2022 FINDINGS: Segmentation: 5 lumbar type vertebral bodies. Pseudoarticulation of an elongated right transverse process at L5 with the sacral ala. Alignment: Dextrocurvature. 6 mm left lateral listhesis of L2  on L3. 9 mm right lateral listhesis of L4 on L5. No significant anterolisthesis or retrolisthesis. Vertebrae: No fracture, evidence of discitis, or bone lesion. Left eccentric degenerative changes L3-L5. Conus medullaris and cauda equina: Conus extends to the L1 level. Conus and cauda equina appear normal. Paraspinal and other soft tissues: Atrophy of the inferior paraspinous muscles. Disc levels: T12-L1: Small right paracentral disc protrusion. No spinal canal stenosis or neural foraminal narrowing. L1-L2: No significant disc bulge. No spinal canal stenosis or neural foraminal narrowing. L2-L3: Mild disc bulge with left subarticular and foraminal protrusion. Narrowing of the left lateral recess. Mild facet arthropathy. Mild spinal canal stenosis and mild left neural foraminal narrowing, unchanged. L3-L4: Mild disc bulge. Moderate facet arthropathy. Ligamentum flavum hypertrophy. Narrowing of the left-greater-than-right lateral recess. Mild to moderate spinal canal stenosis and mild bilateral neural foraminal narrowing, unchanged. L4-L5: Disc osteophyte complex. Moderate facet arthropathy. Ligamentum flavum hypertrophy. Narrowing of the lateral recesses. Moderate to severe spinal canal stenosis and mild-to-moderate bilateral neural foraminal narrowing, unchanged. L5-S1: Mild disc bulge, somewhat eccentric to the left. Mild facet arthropathy. No spinal canal stenosis or neural foraminal narrowing. IMPRESSION: 1. L4-L5 moderate to severe spinal canal stenosis and mild-to-moderate bilateral neural foraminal narrowing. Narrowing of the lateral recesses at this level could affect the descending L5 nerve roots. 2. L3-L4 mild-to-moderate spinal canal stenosis and mild bilateral neural foraminal narrowing. Narrowing of the left-greater-than-right lateral recess at this level could affect the descending L4 nerve roots. 3. L2-L3 mild spinal canal stenosis and mild left neural foraminal narrowing. Narrowing of the left lateral  recess at this level could affect the descending left L3 nerve roots. Electronically Signed   By: Wiliam Ke M.D.   On: 07/05/2022 01:40    Assessment &  Plan:   Problem List Items Addressed This Visit     Hypogonadism male    Testosterone replacment q 2 weeks IM and using caverject  Potential benefits of a long term testosterone use as well as potential risks  and complications were explained to the patient and were aknowledged. Testosterone levels are low however it is working clinically - no dose change      Relevant Orders   CBC with Differential/Platelet   PSA   Testosterone   Well adult exam - Primary     We discussed age appropriate health related issues, including available/recomended screening tests and vaccinations. Labs were ordered to be later reviewed . All questions were answered. We discussed one or more of the following - seat belt use, use of sunscreen/sun exposure exercise, fall risk reduction, second hand smoke exposure, firearm use and storage, seat belt use, a need for adhering to healthy diet and exercise. Labs were ordered.  All questions were answered. Rectal - per Urology Colonoscopy  2015, 2020 Immunizations: Tetanus June '10; Shingles vaccine Jan '11, 2016      Relevant Orders   TSH   Urinalysis   CBC with Differential/Platelet   Lipid panel   PSA   Comprehensive metabolic panel   Low back pain    C/o LBP - unable to walk now - swimming Surgery is planned      Elevated PSA    F/u w/Dr Mena Goes      Relevant Orders   PSA   Polycythemia    Cont to monitor CBC      Insomnia     On Belsomra 15 mg/d.       History of colon polyps    07/2021 - last colon w/Dr Chales Abrahams, due at 73         No orders of the defined types were placed in this encounter.     Follow-up: Return in about 6 months (around 05/23/2023) for a follow-up visit.  Sonda Primes, MD

## 2022-11-20 NOTE — Assessment & Plan Note (Signed)
Testosterone replacment q 2 weeks IM and using caverject  Potential benefits of a long term testosterone use as well as potential risks  and complications were explained to the patient and were aknowledged. Testosterone levels are low however it is working clinically - no dose change  

## 2022-11-20 NOTE — Assessment & Plan Note (Signed)
We discussed age appropriate health related issues, including available/recomended screening tests and vaccinations. Labs were ordered to be later reviewed . All questions were answered. We discussed one or more of the following - seat belt use, use of sunscreen/sun exposure exercise, fall risk reduction, second hand smoke exposure, firearm use and storage, seat belt use, a need for adhering to healthy diet and exercise. Labs were ordered.  All questions were answered. Rectal - per Urology Colonoscopy  2015, 2020 Immunizations: Tetanus June '10; Shingles vaccine Jan '11, 2016 

## 2022-11-20 NOTE — Assessment & Plan Note (Signed)
07/2021 - last colon w/Dr Chales Abrahams, due at 32

## 2022-11-20 NOTE — Assessment & Plan Note (Signed)
C/o LBP - unable to walk now - swimming Surgery is planned

## 2022-11-20 NOTE — Assessment & Plan Note (Signed)
On Belsomra 15 mg/d.  

## 2022-11-20 NOTE — Assessment & Plan Note (Signed)
F/u w/Dr Eskridge 

## 2022-11-20 NOTE — Addendum Note (Signed)
Addended by: Delsa Grana R on: 11/20/2022 08:34 AM   Modules accepted: Orders

## 2022-11-20 NOTE — Assessment & Plan Note (Signed)
Cont to monitor CBC

## 2022-12-06 DIAGNOSIS — M8589 Other specified disorders of bone density and structure, multiple sites: Secondary | ICD-10-CM | POA: Insufficient documentation

## 2022-12-06 DIAGNOSIS — Z681 Body mass index (BMI) 19 or less, adult: Secondary | ICD-10-CM | POA: Insufficient documentation

## 2022-12-06 DIAGNOSIS — Z8262 Family history of osteoporosis: Secondary | ICD-10-CM | POA: Insufficient documentation

## 2022-12-06 DIAGNOSIS — M4807 Spinal stenosis, lumbosacral region: Secondary | ICD-10-CM | POA: Insufficient documentation

## 2022-12-17 ENCOUNTER — Encounter (HOSPITAL_BASED_OUTPATIENT_CLINIC_OR_DEPARTMENT_OTHER): Payer: Self-pay

## 2022-12-17 ENCOUNTER — Emergency Department (HOSPITAL_BASED_OUTPATIENT_CLINIC_OR_DEPARTMENT_OTHER)
Admission: EM | Admit: 2022-12-17 | Discharge: 2022-12-17 | Disposition: A | Discharge status: Home / Self Care | Payer: Medicare Other | Attending: Emergency Medicine | Admitting: Emergency Medicine

## 2022-12-17 ENCOUNTER — Other Ambulatory Visit: Payer: Self-pay

## 2022-12-17 ENCOUNTER — Emergency Department (HOSPITAL_BASED_OUTPATIENT_CLINIC_OR_DEPARTMENT_OTHER): Payer: Medicare Other | Admitting: Radiology

## 2022-12-17 DIAGNOSIS — Z7982 Long term (current) use of aspirin: Secondary | ICD-10-CM | POA: Diagnosis not present

## 2022-12-17 DIAGNOSIS — M20011 Mallet finger of right finger(s): Secondary | ICD-10-CM | POA: Diagnosis not present

## 2022-12-17 DIAGNOSIS — M79641 Pain in right hand: Secondary | ICD-10-CM | POA: Diagnosis present

## 2022-12-17 NOTE — ED Notes (Signed)
Dc instructions reviewed with patient. Patient voiced understanding. Dc with belongings.  °

## 2022-12-17 NOTE — Discharge Instructions (Signed)
Follow up with the hand surgeon in the office.

## 2022-12-17 NOTE — ED Provider Notes (Signed)
EMERGENCY DEPARTMENT AT [facility] Provider Note   CSN: [id number] Arrival date & time: 12/17/22  1323     History  Chief Complaint  Patient presents with   Finger Injury    Troy Marsh is a 72 y.o. male.  72 yo M with a chief complaints of right hand pain.  The patient states that he fell yesterday.  Lost his balance.  Complaining of deformity to his right index finger.  He tells me he just cannot straighten the finger out.  Denies any other injury.        Home Medications Prior to Admission medications   Medication Sig Start Date End Date Taking? Authorizing Provider  alprostadil (CAVERJECT IMPULSE) 20 MCG injection INJECT 20 MCG BY INTRACAVITARY  ROUTE AS DIRECTED AS NEEDED FOR  ERECTILE DYSFUNCTION. USE NO  MORE THAN 3 TIMES WEEKLY 07/14/22   Plotnikov, Georgina Quint, MD  AMBULATORY NON FORMULARY MEDICATION Medication Name: Diltiazem gel 2% Apply a peasize amount 2 times a day for 4 week 06/27/22   Lynann Bologna, MD  aspirin 81 MG tablet Take 81 mg by mouth daily.    [provider]  atorvastatin (LIPITOR) 40 MG tablet Take 40 mg by mouth at bedtime.  11/29/15 12/02/20  [provider]  B-D BLUNT FILL NEEDLE 18G X 1-1/2" MISC USE AS INSTRUCTED 07/19/22   Plotnikov, Georgina Quint, MD  baclofen (LIORESAL) 20 MG tablet Take 1 tablet (20 mg total) by mouth 3 (three) times daily. 05/13/20   Plotnikov, Georgina Quint, MD  BD DISP NEEDLES 21G X 1-1/2" MISC USE AS DIRECTED 12/23/18   Plotnikov, Georgina Quint, MD  BELSOMRA 10 MG TABS TAKE 1 TABLET BY MOUTH AT  BEDTIME AS NEEDED 05/11/22   Plotnikov, Georgina Quint, MD  celecoxib (CELEBREX) 200 MG capsule Take 1 capsule (200 mg total) by mouth 2 (two) times daily. Annual appt is due must see provider for future refills 05/15/22   Plotnikov, Georgina Quint, MD  cetirizine-pseudoephedrine (ZYRTEC-D) 5-120 MG tablet Take 1 tablet by mouth daily as needed for allergies.     [provider]  cyclobenzaprine (FLEXERIL) 5  MG tablet Take 5-10 mg by mouth at bedtime as needed. 06/26/22   [provider]  diltiazem 2 % GEL Diltiazem gel 2% apply rectally twice daily for 4 weeks 06/22/21   Lynann Bologna, MD  emtricitabine-rilpivir-tenofovir AF (ODEFSEY) 200-25-25 MG TABS tablet Take 1 tablet by mouth daily. 04/10/16   [provider]  gabapentin (NEURONTIN) 300 MG capsule Take 1 capsule by mouth 3 (three) times daily. 12/01/20   [provider]  hydrochlorothiazide (HYDRODIURIL) 50 MG tablet Take 1 tablet (50 mg total) by mouth daily. 05/25/22   Plotnikov, Georgina Quint, MD  HYDROcodone-acetaminophen (NORCO) 7.5-325 MG tablet Take 1 tablet by mouth every 6 (six) hours as needed for severe pain. 05/22/22   Plotnikov, Georgina Quint, MD  HYDROcodone-acetaminophen (NORCO) 7.5-325 MG tablet Take 1 tablet by mouth every 6 (six) hours as needed for severe pain. 05/22/22   Plotnikov, Georgina Quint, MD  HYDROcodone-acetaminophen (NORCO) 7.5-325 MG tablet Take 1 tablet by mouth every 6 (six) hours as needed for severe pain. 05/22/22   Plotnikov, Georgina Quint, MD  hydrocortisone (ANUSOL-HC) 25 MG suppository Place 1 suppository (25 mg total) rectally 2 (two) times daily. Use for 10 days after a BM and then use as needed 06/27/22   Lynann Bologna, MD  linaclotide [facility]) 290 MCG CAPS capsule Take 1 capsule (290 mcg total) by mouth  daily before breakfast. 06/27/22   Lynann Bologna, MD  Melatonin 10 MG TABS Take 10 mg by mouth at bedtime as needed (for sleep).     [provider]  methocarbamol (ROBAXIN) 500 MG tablet Take 1 tablet by mouth as needed. 07/03/20   [provider]  mirabegron ER (MYRBETRIQ) 25 MG TB24 tablet Take 25 mg by mouth as needed (2 TIMES A WEEK).    [provider]  montelukast (SINGULAIR) 10 MG tablet Take 1 tablet (10 mg total) by mouth daily. 06/05/22   Plotnikov, Georgina Quint, MD  Multiple Vitamin (MULTIVITAMIN WITH MINERALS) TABS tablet Take 1 tablet by mouth daily. COMPLETE  MULTIVITAMIN MEN'S 50+    [provider]  polyethylene glycol powder (GLYCOLAX/MIRALAX) powder Take 17 g by mouth daily as needed for moderate constipation.     [provider]  potassium chloride (KLOR-CON M) 10 MEQ tablet TAKE 1 TABLET BY MOUTH  DAILY 04/03/22   Plotnikov, Georgina Quint, MD  RESTASIS 0.05 % ophthalmic emulsion Place 1 drop into both eyes 2 (two) times daily. 10/25/15   [provider]  tadalafil (CIALIS) 5 MG tablet TAKE 1 TABLET EVERY DAY 10/18/22   Plotnikov, Georgina Quint, MD  testosterone enanthate (DELATESTRYL) 200 MG/ML injection Inject 1 mL (200 mg total) into the muscle every 14 (fourteen) days. 10/18/22   Plotnikov, Georgina Quint, MD      Allergies    Patient has no known allergies.    Review of Systems   Review of Systems  Physical Exam Updated Vital Signs BP (!) 149/84   Pulse 66   Temp 98.2 F (36.8 C) (Temporal)   Resp 20   Ht 5\' 10"  (1.778 m)   Wt 60.9 kg   SpO2 100%   BMI 19.25 kg/m  Physical Exam Vitals and nursing note reviewed.  Constitutional:      Appearance: He is well-developed.  HENT:     Head: Normocephalic and atraumatic.  Eyes:     Pupils: Pupils are equal, round, and reactive to light.  Neck:     Vascular: No JVD.  Cardiovascular:     Rate and Rhythm: Normal rate and regular rhythm.     Heart sounds: No murmur heard.    No friction rub. No gallop.  Pulmonary:     Effort: No respiratory distress.     Breath sounds: No wheezing.  Abdominal:     General: There is no distension.     Tenderness: There is no abdominal tenderness. There is no guarding or rebound.  Musculoskeletal:        General: Normal range of motion.     Cervical back: Normal range of motion and neck supple.     Comments: Patient is unable to extend the right fourth digit DIP.  Flexion intact.  Skin:    Coloration: Skin is not pale.     Findings: No rash.  Neurological:     Mental Status: He is alert and oriented to person, place, and time.   Psychiatric:        Behavior: Behavior normal.     ED Results / Procedures / Treatments   Labs (all labs ordered are listed, but only abnormal results are displayed) Labs Reviewed - No data to display  EKG None  Radiology DG Hand Complete Right  Result Date: 12/17/2022 CLINICAL DATA:  Fall.  Right hand injury pain. EXAM: RIGHT HAND - COMPLETE 3+ VIEW COMPARISON:  None Available. FINDINGS: There is no evidence of fracture  or dislocation. There is no evidence of arthropathy or other focal bone abnormality. Soft tissues are unremarkable. IMPRESSION: Negative. Electronically Signed   By: Danae Orleans M.D.   On: 12/17/2022 14:24    Procedures Procedures    Medications Ordered in ED Medications - No data to display  ED Course/ Medical Decision Making/ A&P                             Medical Decision Making Amount and/or Complexity of Data Reviewed Radiology: ordered.   72 yo M with a chief complaints of deformity to his finger.  The patient suffered a nonsyncopal fall by history yesterday.  He has mallet finger clinically.  Plain film of the hand independently interpreted by me without fracture.  Will splint in extension.  Have him follow-up with hand to the office.  3:46 PM:  I have discussed the diagnosis/risks/treatment options with the patient.  Evaluation and diagnostic testing in the emergency department does not suggest an emergent condition requiring admission or immediate intervention beyond what has been performed at this time.  They will follow up with Hand. We also discussed returning to the ED immediately if new or worsening sx occur. We discussed the sx which are most concerning (e.g., sudden worsening pain, fever, inability to tolerate by mouth) that necessitate immediate return. Medications administered to the patient during their visit and any new prescriptions provided to the patient are listed below.  Medications given during this visit Medications - No data to  display   The patient appears reasonably screen and/or stabilized for discharge and I doubt any other medical condition or other [facility] requiring further screening, evaluation, or treatment in the ED at this time prior to discharge.          Final Clinical Impression(s) / ED Diagnoses Final diagnoses:  Mallet deformity of right middle finger    Rx / DC Orders ED Discharge Orders     None         Melene Plan, DO 12/17/22 1546

## 2022-12-17 NOTE — ED Triage Notes (Signed)
Patient here POV from Home.  Endorses almost falling yesterday, 20 Hours ago, and injured his Right Fourth Digit in the process.  NAD Noted during Triage. A&Ox4. GCS 15. Ambulatory.

## 2022-12-19 ENCOUNTER — Telehealth: Payer: Self-pay

## 2022-12-19 NOTE — Transitions of Care (Post Inpatient/ED Visit) (Signed)
12/19/2022  Name: Troy Marsh MRN: [id number] DOB: February 24, 1951  Today's TOC FU Call Status: Today's TOC FU Call Status:: Successful TOC FU Call Competed TOC FU Call Complete Date: 12/19/22  Transition Care Management Follow-up Telephone Call Date of Discharge: 12/17/22 Discharge Facility: [facility] ([facility]) Type of Discharge: Emergency Department Reason for ED Visit: Other: (finger injury) How have you been since you were released from the hospital?: Same Any questions or concerns?: No  Items Reviewed: Did you receive and understand the discharge instructions provided?: Yes Medications obtained,verified, and reconciled?: Yes (Medications Reviewed) Any new allergies since your discharge?: No Dietary orders reviewed?: NA Do you have support at home?: Yes People in Home: spouse  Medications Reviewed Today: Medications Reviewed Today     Reviewed by Karena Addison, LPN (Licensed Practical Nurse) on 12/19/22 at 1346  Med List Status: <None>   Medication Order Taking? Sig Documenting Provider Last Dose Status Informant  alprostadil (CAVERJECT IMPULSE) 20 MCG injection [id number] Yes INJECT 20 MCG BY INTRACAVITARY  ROUTE AS DIRECTED AS NEEDED FOR  ERECTILE DYSFUNCTION. USE NO  MORE THAN 3 TIMES WEEKLY Plotnikov, Georgina Quint, MD Taking Active   AMBULATORY Clent [facility] MEDICATION [id number] Yes Medication Name: Diltiazem gel 2% Apply a peasize amount 2 times a day for 4 week Lynann Bologna, MD Taking Active   aspirin 81 MG tablet [id number] Yes Take 81 mg by mouth daily. [provider] Taking Active Self  atorvastatin (LIPITOR) 40 MG tablet [id number]  Take 40 mg by mouth at bedtime.  [provider]  Expired 12/02/20 2359 Self           Med Note (DAVIS, SOPHIA A   Wed May 23, 2017  3:00 PM)    B-D BLUNT FILL NEEDLE 18G X 1-1/2" MISC [id number] Yes USE AS INSTRUCTED Plotnikov, Georgina Quint, MD Taking Active   baclofen (LIORESAL) 20 MG tablet [id number] Yes Take 1  tablet (20 mg total) by mouth 3 (three) times daily. Plotnikov, Georgina Quint, MD Taking Active   BD DISP NEEDLES 21G X 1-1/2" MISC [id number] Yes USE AS DIRECTED Plotnikov, Georgina Quint, MD Taking Active   BELSOMRA 10 MG TABS [id number] Yes TAKE 1 TABLET BY MOUTH AT  BEDTIME AS NEEDED Plotnikov, Georgina Quint, MD Taking Active   celecoxib (CELEBREX) 200 MG capsule [id number] Yes Take 1 capsule (200 mg total) by mouth 2 (two) times daily. Annual appt is due must see provider for future refills Plotnikov, Georgina Quint, MD Taking Active   cetirizine-pseudoephedrine (ZYRTEC-D) 5-120 MG tablet [id number] Yes Take 1 tablet by mouth daily as needed for allergies.  [provider] Taking Active Self  cyclobenzaprine (FLEXERIL) 5 MG tablet [id number] Yes Take 5-10 mg by mouth at bedtime as needed. [provider] Taking Active   diltiazem 2 % GEL [id number] Yes Diltiazem gel 2% apply rectally twice daily for 4 weeks Lynann Bologna, MD Taking Active   emtricitabine-rilpivir-tenofovir AF (ODEFSEY) 200-25-25 MG TABS tablet [id number] Yes Take 1 tablet by mouth daily. [provider] Taking Active Self           Med Note (DAVIS, SOPHIA A   Wed May 23, 2017  3:00 PM)    gabapentin (NEURONTIN) 300 MG capsule [id number] Yes Take 1 capsule by mouth 3 (three) times daily. [provider] Taking Active   hydrochlorothiazide (HYDRODIURIL) 50 MG tablet [id number] Yes Take 1 tablet (50 mg total) by mouth daily. Plotnikov, Georgina Quint, MD Taking Active   HYDROcodone-acetaminophen [facility]) 7.5-325 MG tablet [id number] Yes  Take 1 tablet by mouth every 6 (six) hours as needed for severe pain. Plotnikov, Georgina Quint, MD Taking Active   HYDROcodone-acetaminophen (NORCO) 7.5-325 MG tablet [id number] Yes Take 1 tablet by mouth every 6 (six) hours as needed for severe pain. Plotnikov, Georgina Quint, MD Taking Active   HYDROcodone-acetaminophen (NORCO) 7.5-325 MG tablet [id number] Yes Take 1 tablet by mouth every 6 (six) hours as  needed for severe pain. Plotnikov, Georgina Quint, MD Taking Active   hydrocortisone (ANUSOL-HC) 25 MG suppository [id number] Yes Place 1 suppository (25 mg total) rectally 2 (two) times daily. Use for 10 days after a BM and then use as needed Lynann Bologna, MD Taking Active   linaclotide [facility]) 290 MCG CAPS capsule [id number] Yes Take 1 capsule (290 mcg total) by mouth daily before breakfast. Lynann Bologna, MD Taking Active   Melatonin 10 MG TABS [id number] Yes Take 10 mg by mouth at bedtime as needed (for sleep).  [provider] Taking Active Self  methocarbamol (ROBAXIN) 500 MG tablet [id number] Yes Take 1 tablet by mouth as needed. [provider] Taking Active   mirabegron ER (MYRBETRIQ) 25 MG TB24 tablet [id number] Yes Take 25 mg by mouth as needed (2 TIMES A WEEK). [provider] Taking Active Self  montelukast (SINGULAIR) 10 MG tablet [id number] Yes Take 1 tablet (10 mg total) by mouth daily. Plotnikov, Georgina Quint, MD Taking Active   Multiple Vitamin (MULTIVITAMIN WITH MINERALS) TABS tablet [id number] Yes Take 1 tablet by mouth daily. COMPLETE MULTIVITAMIN MEN'S 50+ [provider] Taking Active Self  polyethylene glycol powder (GLYCOLAX/MIRALAX) powder [id number] Yes Take 17 g by mouth daily as needed for moderate constipation.  [provider] Taking Active Self  potassium chloride (KLOR-CON M) 10 MEQ tablet [id number] Yes TAKE 1 TABLET BY MOUTH  DAILY Plotnikov, Georgina Quint, MD Taking Active   RESTASIS 0.05 % ophthalmic emulsion [id number] Yes Place 1 drop into both eyes 2 (two) times daily. [provider] Taking Active Self           Med Note (DAVIS, SOPHIA A   Wed May 23, 2017  2:56 PM)    tadalafil (CIALIS) 5 MG tablet [id number] Yes TAKE 1 TABLET EVERY DAY Plotnikov, Georgina Quint, MD Taking Active   testosterone enanthate (DELATESTRYL) 200 MG/ML injection [id number] Yes Inject 1 mL (200 mg total) into the muscle every 14 (fourteen) days. Plotnikov,  Georgina Quint, MD Taking Active             Home Care and Equipment/Supplies: Were Home Health Services Ordered?: NA Any new equipment or medical supplies ordered?: NA  Functional Questionnaire: Do you need assistance with bathing/showering or dressing?: No Do you need assistance with meal preparation?: No Do you need assistance with eating?: No Do you have difficulty maintaining continence: No Do you need assistance with getting out of bed/getting out of a chair/moving?: No Do you have difficulty managing or taking your medications?: No  Follow up appointments reviewed: PCP Follow-up appointment confirmed?: NA Specialist Hospital Follow-up appointment confirmed?: Yes Date of Specialist follow-up appointment?: 02/08/23 Follow-Up Specialty Provider:: hand specialist Do you need transportation to your follow-up appointment?: No Do you understand care options if your condition(s) worsen?: Yes-patient verbalized understanding    SIGNATURE Karena Addison, LPN [facility] Nurse Health Advisor Direct Dial [phone]

## 2023-01-10 ENCOUNTER — Other Ambulatory Visit: Payer: Self-pay | Admitting: Orthopedic Surgery

## 2023-01-17 ENCOUNTER — Other Ambulatory Visit (HOSPITAL_BASED_OUTPATIENT_CLINIC_OR_DEPARTMENT_OTHER): Payer: Self-pay

## 2023-01-17 ENCOUNTER — Emergency Department (HOSPITAL_BASED_OUTPATIENT_CLINIC_OR_DEPARTMENT_OTHER)
Admission: EM | Admit: 2023-01-17 | Discharge: 2023-01-17 | Disposition: A | Discharge status: Home / Self Care | Payer: Medicare Other | Attending: Emergency Medicine | Admitting: Emergency Medicine

## 2023-01-17 ENCOUNTER — Emergency Department (HOSPITAL_BASED_OUTPATIENT_CLINIC_OR_DEPARTMENT_OTHER): Payer: Medicare Other | Admitting: Radiology

## 2023-01-17 ENCOUNTER — Other Ambulatory Visit: Payer: Self-pay

## 2023-01-17 DIAGNOSIS — M20011 Mallet finger of right finger(s): Secondary | ICD-10-CM | POA: Insufficient documentation

## 2023-01-17 DIAGNOSIS — M79644 Pain in right finger(s): Secondary | ICD-10-CM | POA: Diagnosis present

## 2023-01-17 DIAGNOSIS — Z7982 Long term (current) use of aspirin: Secondary | ICD-10-CM | POA: Insufficient documentation

## 2023-01-17 NOTE — ED Triage Notes (Signed)
Pt arrived via POV. Pt reports finger injury on June 2, seen here, upon d/c pt was told to follow up with Ortho, pt reports his appt is 7/25. Pt reports he wore splint for approx 1 wk, during the day. Pt c/o worsening pain.

## 2023-01-17 NOTE — Discharge Instructions (Addendum)
As we discussed, repeat x-ray imaging of your finger did not reveal any new fracture or injury.  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  The management of this involves wearing a splint at all times except showering to help ensure that it stays straight which improves healing.  You need to see a hand specialist to determine if this will require surgery.  Please call Dr. Carlos Levering office routinely to see if you can get worked in for an earlier appointment.  I have also given you a referral to Dr. Donnie Mesa office who is another hand specialist with a number to call to schedule an appointment for follow-up.  Please call them and see if they can get you in any earlier.  Return to development of any new or worsening symptoms.

## 2023-01-17 NOTE — ED Provider Notes (Signed)
[place] EMERGENCY DEPARTMENT AT [facility] Provider Note   CSN: [id number] Arrival date & time: 01/17/23  1021     History {Add pertinent medical, surgical, social history, OB history to HPI:1} Chief Complaint  Patient presents with  . Finger Injury    Troy Marsh is a 72 y.o. male.  Patient returns today with complaints of persistent right ring finger pain.  He states that same occurred initially on 6/2 when he sustained an injury.  He came here for imaging and had an x-ray that was normal but was clinically diagnosed with mallet finger given inability to extend the distal MCP of his right ring finger.  He was placed in a splint and given a referral to hand for follow-up.  He called Dr. Amanda Pea to schedule follow-up appointment and was placed on the schedule for 7/25.  He states that he has called several times to try to get an earlier appointment and they have not been able to work him in.  He was given a metal splint to wear, however he states that it hurt his finger and so he stopped wearing it after a few days.  He continues to be unable to extend his finger.  Denies fevers or chills.  The history is provided by the patient. No language interpreter was used.       Home Medications Prior to Admission medications   Medication Sig Start Date End Date Taking? Authorizing Provider  alprostadil (CAVERJECT IMPULSE) 20 MCG injection INJECT 20 MCG BY INTRACAVITARY  ROUTE AS DIRECTED AS NEEDED FOR  ERECTILE DYSFUNCTION. USE NO  MORE THAN 3 TIMES WEEKLY 07/14/22   Plotnikov, Georgina Quint, MD  AMBULATORY NON FORMULARY MEDICATION Medication Name: Diltiazem gel 2% Apply a peasize amount 2 times a day for 4 week 06/27/22   Lynann Bologna, MD  aspirin 81 MG tablet Take 81 mg by mouth daily.    [provider]  atorvastatin (LIPITOR) 40 MG tablet Take 40 mg by mouth at bedtime.  11/29/15 12/02/20  [provider]  B-D BLUNT FILL NEEDLE 18G X 1-1/2" MISC USE AS  INSTRUCTED 07/19/22   Plotnikov, Georgina Quint, MD  baclofen (LIORESAL) 20 MG tablet Take 1 tablet (20 mg total) by mouth 3 (three) times daily. 05/13/20   Plotnikov, Georgina Quint, MD  BD DISP NEEDLES 21G X 1-1/2" MISC USE AS DIRECTED 12/23/18   Plotnikov, Georgina Quint, MD  BELSOMRA 10 MG TABS TAKE 1 TABLET BY MOUTH AT  BEDTIME AS NEEDED 05/11/22   Plotnikov, Georgina Quint, MD  celecoxib (CELEBREX) 200 MG capsule Take 1 capsule (200 mg total) by mouth 2 (two) times daily. Annual appt is due must see provider for future refills 05/15/22   Plotnikov, Georgina Quint, MD  cetirizine-pseudoephedrine (ZYRTEC-D) 5-120 MG tablet Take 1 tablet by mouth daily as needed for allergies.     [provider]  cyclobenzaprine (FLEXERIL) 5 MG tablet Take 5-10 mg by mouth at bedtime as needed. 06/26/22   [provider]  diltiazem 2 % GEL Diltiazem gel 2% apply rectally twice daily for 4 weeks 06/22/21   Lynann Bologna, MD  emtricitabine-rilpivir-tenofovir AF (ODEFSEY) 200-25-25 MG TABS tablet Take 1 tablet by mouth daily. 04/10/16   [provider]  gabapentin (NEURONTIN) 300 MG capsule Take 1 capsule by mouth 3 (three) times daily. 12/01/20   [provider]  hydrochlorothiazide (HYDRODIURIL) 50 MG tablet Take 1 tablet (50 mg total) by mouth daily. 05/25/22   Plotnikov, Georgina Quint, MD  HYDROcodone-acetaminophen (NORCO) 7.5-325 MG tablet Take 1 tablet by mouth every 6 (six) hours as needed for severe pain. 05/22/22   Plotnikov, Georgina Quint, MD  HYDROcodone-acetaminophen (NORCO) 7.5-325 MG tablet Take 1 tablet by mouth every 6 (six) hours as needed for severe pain. 05/22/22   Plotnikov, Georgina Quint, MD  HYDROcodone-acetaminophen (NORCO) 7.5-325 MG tablet Take 1 tablet by mouth every 6 (six) hours as needed for severe pain. 05/22/22   Plotnikov, Georgina Quint, MD  hydrocortisone (ANUSOL-HC) 25 MG suppository Place 1 suppository (25 mg total) rectally 2 (two) times daily. Use for 10 days after a BM and then use as needed  06/27/22   Lynann Bologna, MD  linaclotide [facility]) 290 MCG CAPS capsule Take 1 capsule (290 mcg total) by mouth daily before breakfast. 06/27/22   Lynann Bologna, MD  Melatonin 10 MG TABS Take 10 mg by mouth at bedtime as needed (for sleep).     [provider]  methocarbamol (ROBAXIN) 500 MG tablet Take 1 tablet by mouth as needed. 07/03/20   [provider]  mirabegron ER (MYRBETRIQ) 25 MG TB24 tablet Take 25 mg by mouth as needed (2 TIMES A WEEK).    [provider]  montelukast (SINGULAIR) 10 MG tablet Take 1 tablet (10 mg total) by mouth daily. 06/05/22   Plotnikov, Georgina Quint, MD  Multiple Vitamin (MULTIVITAMIN WITH MINERALS) TABS tablet Take 1 tablet by mouth daily. COMPLETE MULTIVITAMIN MEN'S 50+    [provider]  polyethylene glycol powder (GLYCOLAX/MIRALAX) powder Take 17 g by mouth daily as needed for moderate constipation.     [provider]  potassium chloride (KLOR-CON M) 10 MEQ tablet TAKE 1 TABLET BY MOUTH  DAILY 04/03/22   Plotnikov, Georgina Quint, MD  RESTASIS 0.05 % ophthalmic emulsion Place 1 drop into both eyes 2 (two) times daily. 10/25/15   [provider]  tadalafil (CIALIS) 5 MG tablet TAKE 1 TABLET EVERY DAY 10/18/22   Plotnikov, Georgina Quint, MD  testosterone enanthate (DELATESTRYL) 200 MG/ML injection Inject 1 mL (200 mg total) into the muscle every 14 (fourteen) days. 10/18/22   Plotnikov, Georgina Quint, MD      Allergies    Patient has no known allergies.    Review of Systems   Review of Systems  Musculoskeletal:  Positive for arthralgias.  All other systems reviewed and are negative.   Physical Exam Updated Vital Signs BP (!) 134/91 (BP Location: Right Arm)   Pulse 68   Temp 98.4 F (36.9 C) (Oral)   Resp 16   SpO2 95%  Physical Exam Vitals and nursing note reviewed.  Constitutional:      General: He is not in acute distress.    Appearance: Normal appearance. He is normal weight. He is not ill-appearing,  toxic-appearing or diaphoretic.  HENT:     Head: Normocephalic and atraumatic.  Cardiovascular:     Rate and Rhythm: Normal rate.  Pulmonary:     Effort: Pulmonary effort is normal. No respiratory distress.  Musculoskeletal:        General: Normal range of motion.     Cervical back: Normal range of motion.     Comments: Findings consistent with mallet finger, unable to extend the distal MCP of the right ring finger.  No erythema, warmth, fluctuance, induration, or overlying skin changes.  TTP throughout this joint.  Good capillary refill.  Skin:    General: Skin is warm and dry.  Neurological:     General: No focal deficit present.  Mental Status: He is alert.  Psychiatric:        Mood and Affect: Mood normal.        Behavior: Behavior normal.    ED Results / Procedures / Treatments   Labs (all labs ordered are listed, but only abnormal results are displayed) Labs Reviewed - No data to display  EKG None  Radiology DG Finger Ring Right  Result Date: 01/17/2023 CLINICAL DATA:  Patient reports injury to finger on June 2nd. Complains of worsening pain. EXAM: RIGHT RING FINGER 2+V COMPARISON:  12/17/2022 FINDINGS: No signs of acute fracture. As noted previously scratch set similar to the previous exam there is a flexion deformity involving the fourth DIP joint. Soft tissues appear unremarkable. Joint space narrowing is identified at the third MCP joint, unchanged from previous exam. IMPRESSION: 1. No acute fracture. 2. Flexion deformity involving the fourth DIP joint. Correlate for any clinical signs or symptoms injury to the extensor mechanism at the DIP joint of the fourth digit. Electronically Signed   By: Signa Kell M.D.   On: 01/17/2023 11:36    Procedures Procedures  {Document cardiac monitor, telemetry assessment procedure when appropriate:1}  Medications Ordered in ED Medications - No data to display  ED Course/ Medical Decision Making/ A&P   {   Click here for  ABCD2, HEART and other calculatorsREFRESH Note before signing :1}                          Medical Decision Making Amount and/or Complexity of Data Reviewed Radiology: ordered.   Patient presents today with persistent finger pain after injury 1 month ago.  He is afebrile, nontoxic-appearing, and in no acute distress with reassuring vital signs.  Chart reviewed, patient was here 1 month ago and had x-ray imaging that did not show a fracture, patient was clinically diagnosed with a mallet finger injury and given a splint.  Physical exam consistent with mallet finger.  No signs of infection.  Patient has not been wearing his splint.  He unfortunately has not been able to follow-up with hand surgery yet either as his appointment is not till 7/25.  Repeat x-ray imaging obtained which has resulted and reveals  1. No acute fracture. 2. Flexion deformity involving the fourth DIP joint. Correlate for any clinical signs or symptoms injury to the extensor mechanism at the DIP joint of the fourth digit.   I have personally reviewed and interpreted this imaging and agree with radiology interpretation.  Patient given a different static finger splint per his request as the other one didn't see to fit him appropriately.  I have emphasized the importance of wearing this at all times except with showering.  Will give him a referral to a different hand specialist to see if they can see him any earlier and recommend that he call routinely to see if he can be worked in for an earlier appointment. Evaluation and diagnostic testing in the emergency department does not suggest an emergent condition requiring admission or immediate intervention beyond what has been performed at this time.  Plan for discharge with close PCP follow-up.  Patient is understanding and amenable with plan, educated on red flag symptoms that would prompt immediate return.  Patient discharged in stable condition.   {Document critical care time when  appropriate:1} {Document review of labs and clinical decision tools ie heart score, Chads2Vasc2 etc:1}  {Document your independent review of radiology images, and any outside records:1} {Document your  discussion with family members, caretakers, and with consultants:1} {Document social determinants of health affecting pt's care:1} {Document your decision making why or why not admission, treatments were needed:1} Final Clinical Impression(s) / ED Diagnoses Final diagnoses:  None    Rx / DC Orders ED Discharge Orders     None

## 2023-01-27 ENCOUNTER — Other Ambulatory Visit: Payer: Self-pay | Admitting: Internal Medicine

## 2023-01-31 ENCOUNTER — Encounter: Payer: Self-pay | Admitting: Internal Medicine

## 2023-02-05 NOTE — Progress Notes (Signed)
Surgical Instructions    Your procedure is scheduled on Wednesday July 31st.  Report to [facility] Main Entrance "A" at 5:30 A.M., then check in with the Admitting office.  Call this number if you have problems the morning of surgery:  [phone]   If you have any questions prior to your surgery date call [phone]: Open Monday-Friday 8am-4pm If you experience any cold or flu symptoms such as cough, fever, chills, shortness of breath, etc. between now and your scheduled surgery, please notify us at the above number     Remember:  Do not eat after midnight the night before your surgery  You may drink clear liquids until 4:30am the morning of your surgery.   Clear liquids allowed are: Water, Non-Citrus Juices (without pulp), Carbonated Beverages, Clear Tea, Black Coffee ONLY (NO MILK, CREAM OR POWDERED CREAMER of any kind), and Gatorade    Take these medicines the morning of surgery with A SIP OF WATER: emtricitabine-rilpivir-tenofovir AF (ODEFSEY) 200-25-25 MG TABS tablet  gabapentin (NEURONTIN) 300 MG capsule  linaclotide (LINZESS) 290 MCG CAPS capsule  montelukast (SINGULAIR) 10 MG tablet  potassium chloride (KLOR-CON M) 10 MEQ tablet    IF NEEDED  baclofen (LIORESAL) 20 MG tablet  cyclobenzaprine (FLEXERIL) 5 MG tablet  HYDROcodone-acetaminophen (NORCO) 7.5-325 MG tablet  methocarbamol (ROBAXIN) 500 MG tablet  RESTASIS 0.05 % ophthalmic emulsion    Follow your surgeon's instructions on when to stop Aspirin.  If no instructions were given by your surgeon then you will need to call the office to get those instructions.     As of today, STOP taking any Aspirin (unless otherwise instructed by your surgeon) Aleve, Naproxen, Ibuprofen, Motrin, Advil, Goody's, BC's, all herbal medications, fish oil, and all vitamins.           Do not wear jewelry  Do not wear lotions, powders, cologne or deodorant. Do not shave 48 hours prior to surgery.  Men may shave face and neck. Do not  bring valuables to the hospital. Do not wear nail polish  [place] is not responsible for any belongings or valuables.    Do NOT Smoke (Tobacco/Vaping)  24 hours prior to your procedure  If you use a CPAP at night, you may bring your mask for your overnight stay.   Contacts, glasses, hearing aids, dentures or partials may not be worn into surgery, please bring cases for these belongings   For patients admitted to the hospital, discharge time will be determined by your treatment team.   Patients discharged the day of surgery will not be allowed to drive home, and someone needs to stay with them for 24 hours.   SURGICAL WAITING ROOM VISITATION Patients having surgery or a procedure may have no more than 2 support people in the waiting area - these visitors may rotate.   Children under the age of 64 must have an adult with them who is not the patient. If the patient needs to stay at the hospital during part of their recovery, the visitor guidelines for inpatient rooms apply. Pre-op nurse will coordinate an appropriate time for 1 support person to accompany patient in pre-op.  This support person may not rotate.   Please refer to [email or web address] for the visitor guidelines for Inpatients (after your surgery is over and you are in a regular room).    Special instructions:    Oral Hygiene is also important to reduce your risk of infection.  Remember - BRUSH YOUR TEETH THE MORNING OF SURGERY  WITH YOUR REGULAR TOOTHPASTE   [place]- Preparing For Surgery  Before surgery, you can play an important role. Because skin is not sterile, your skin needs to be as free of germs as possible. You can reduce the number of germs on your skin by washing with CHG (chlorahexidine gluconate) Soap before surgery.  CHG is an antiseptic cleaner which kills germs and bonds with the skin to continue killing germs even after washing.     Please do not  use if you have an allergy to CHG or antibacterial soaps. If your skin becomes reddened/irritated stop using the CHG.  Do not shave (including legs and underarms) for at least 48 hours prior to first CHG shower. It is OK to shave your face.  Please follow these instructions carefully.     Shower the NIGHT BEFORE SURGERY and the MORNING OF SURGERY with CHG Soap.   If you chose to wash your hair, wash your hair first as usual with your normal shampoo. After you shampoo, rinse your hair and body thoroughly to remove the shampoo.  Then [employer] and genitals (private parts) with your normal soap and rinse thoroughly to remove soap.  After that Use CHG Soap as you would any other liquid soap. You can apply CHG directly to the skin and wash gently with a scrungie or a clean washcloth.   Apply the CHG Soap to your body ONLY FROM THE NECK DOWN.  Do not use on open wounds or open sores. Avoid contact with your eyes, ears, mouth and genitals (private parts). Wash Face and genitals (private parts)  with your normal soap.   Wash thoroughly, paying special attention to the area where your surgery will be performed.  Thoroughly rinse your body with warm water from the neck down.  DO NOT shower/wash with your normal soap after using and rinsing off the CHG Soap.  Pat yourself dry with a CLEAN TOWEL.  Wear CLEAN PAJAMAS to bed the night before surgery  Place CLEAN SHEETS on your bed the night before your surgery  DO NOT SLEEP WITH PETS.   Day of Surgery:  Take a shower with CHG soap. Wear Clean/Comfortable clothing the morning of surgery Do not apply any deodorants/lotions.   Remember to brush your teeth WITH YOUR REGULAR TOOTHPASTE.    If you received a COVID test during your pre-op visit, it is requested that you wear a mask when out in public, stay away from anyone that may not be feeling well, and notify your surgeon if you develop symptoms. If you have been in contact with anyone that has  tested positive in the last 10 days, please notify your surgeon.    Please read over the following fact sheets that you were given.

## 2023-02-06 ENCOUNTER — Encounter (HOSPITAL_COMMUNITY)
Admission: RE | Admit: 2023-02-06 | Discharge: 2023-02-06 | Disposition: A | Discharge status: Home / Self Care | Payer: Medicare Other | Source: Ambulatory Visit | Attending: Orthopedic Surgery | Admitting: Orthopedic Surgery

## 2023-02-06 ENCOUNTER — Encounter (HOSPITAL_COMMUNITY): Payer: Self-pay

## 2023-02-06 ENCOUNTER — Other Ambulatory Visit: Payer: Self-pay

## 2023-02-06 VITALS — BP 142/82 | HR 82 | Temp 97.7°F | Resp 18 | Ht 70.0 in | Wt 135.8 lb

## 2023-02-06 DIAGNOSIS — Z01818 Encounter for other preprocedural examination: Secondary | ICD-10-CM | POA: Diagnosis not present

## 2023-02-06 DIAGNOSIS — I10 Essential (primary) hypertension: Secondary | ICD-10-CM | POA: Insufficient documentation

## 2023-02-06 LAB — BASIC METABOLIC PANEL
Anion gap: 9 (ref 5–15)
BUN: 9 mg/dL (ref 8–23)
CO2: 29 mmol/L (ref 22–32)
Calcium: 9.1 mg/dL (ref 8.9–10.3)
Chloride: 96 mmol/L — ABNORMAL LOW (ref 98–111)
Creatinine, Ser: 1.08 mg/dL (ref 0.61–1.24)
GFR, Estimated: >60 mL/min (ref >60)
Glucose, Bld: 98 mg/dL (ref 70–99)
Potassium: 3.5 mmol/L (ref 3.5–5.1)
Sodium: 134 mmol/L — ABNORMAL LOW (ref 135–145)

## 2023-02-06 LAB — CBC
HCT: 46.9 % (ref 39.0–52.0)
Hemoglobin: 16.2 g/dL (ref 13.0–17.0)
MCH: 32.1 pg (ref 26.0–34.0)
MCHC: 34.5 g/dL (ref 30.0–36.0)
MCV: 92.9 fL (ref 80.0–100.0)
Platelets: 179 10*3/uL (ref 150–400)
RBC: 5.05 MIL/uL (ref 4.22–5.81)
RDW: 13.2 % (ref 11.5–15.5)
WBC: 4.6 10*3/uL (ref 4.0–10.5)
nRBC: 0 % (ref 0.0–0.2)

## 2023-02-06 LAB — TYPE AND SCREEN
ABO/RH(D): A POS
Antibody Screen: NEGATIVE

## 2023-02-06 LAB — SURGICAL PCR SCREEN
MRSA, PCR: POSITIVE — AB
Staphylococcus aureus: POSITIVE — AB

## 2023-02-06 NOTE — Progress Notes (Signed)
PCP - Plotnikof Cardiologist - Denies  PPM/ICD - Denies  Chest x-ray - N/A EKG - 05/28/22 Stress Test - Denies ECHO - Denies Cardiac Cath - Denies  Sleep Study - Denies  Denies DM  Blood Thinner Instructions: n/a Aspirin Instructions:Requested patient to call for aspirin instructions  ERAS Protcol -Yes  PRE-SURGERY Ensure given   COVID TEST- N/I   Anesthesia review: No  Patient denies shortness of breath, fever, cough and chest pain at PAT appointment   All instructions explained to the patient, with a verbal understanding of the material. Patient agrees to go over the instructions while at home for a better understanding. The opportunity to ask questions was provided.

## 2023-02-06 NOTE — Progress Notes (Signed)
 Pre-operative 5 CHG Bath Instructions   You can play a key role in reducing the risk of infection after surgery. Your skin needs to be as free of germs as possible. You can reduce the number of germs on your skin by washing with CHG (chlorhexidine gluconate) soap before surgery. CHG is an antiseptic soap that kills germs and continues to kill germs even after washing.   DO NOT use if you have an allergy to chlorhexidine/CHG or antibacterial soaps. If your skin becomes reddened or irritated, stop using the CHG and notify one of our RNs at [phone].   Please shower with the CHG soap starting 4 days before surgery using the following schedule:     Please keep in mind the following:  DO NOT shave, including legs and underarms, starting the day of your first shower.   You may shave your face at any point before/day of surgery.  Place clean sheets on your bed the day you start using CHG soap. Use a clean washcloth (not used since being washed) for each shower. DO NOT sleep with pets once you start using the CHG.   CHG Shower Instructions:  If you choose to wash your hair and private area, wash first with your normal shampoo/soap.  After you use shampoo/soap, rinse your hair and body thoroughly to remove shampoo/soap residue.  Turn the water OFF and apply about 3 tablespoons (45 ml) of CHG soap to a CLEAN washcloth.  Apply CHG soap ONLY FROM YOUR NECK DOWN TO YOUR TOES (washing for 3-5 minutes)  DO NOT use CHG soap on face, private areas, open wounds, or sores.  Pay special attention to the area where your surgery is being performed.  If you are having back surgery, having someone wash your back for you may be helpful. Wait 2 minutes after CHG soap is applied, then you may rinse off the CHG soap.  Pat dry with a clean towel  Put on clean clothes/pajamas   If you choose to wear lotion, please use ONLY the CHG-compatible lotions on the back of this paper.     Additional instructions for  the day of surgery: DO NOT APPLY any lotions, deodorants, cologne, or perfumes.   Put on clean/comfortable clothes.  Brush your teeth.  Ask your nurse before applying any prescription medications to the skin.      CHG Compatible Lotions   Aveeno Moisturizing lotion  Cetaphil Moisturizing Cream  Cetaphil Moisturizing Lotion  Clairol Herbal Essence Moisturizing Lotion, Dry Skin  Clairol Herbal Essence Moisturizing Lotion, Extra Dry Skin  Clairol Herbal Essence Moisturizing Lotion, Normal Skin  Curel Age Defying Therapeutic Moisturizing Lotion with Alpha Hydroxy  Curel Extreme Care Body Lotion  Curel Soothing Hands Moisturizing Hand Lotion  Curel Therapeutic Moisturizing Cream, Fragrance-Free  Curel Therapeutic Moisturizing Lotion, Fragrance-Free  Curel Therapeutic Moisturizing Lotion, Original Formula  Eucerin Daily Replenishing Lotion  Eucerin Dry Skin Therapy Plus Alpha Hydroxy Crme  Eucerin Dry Skin Therapy Plus Alpha Hydroxy Lotion  Eucerin Original Crme  Eucerin Original Lotion  Eucerin Plus Crme Eucerin Plus Lotion  Eucerin TriLipid Replenishing Lotion  Keri Anti-Bacterial Hand Lotion  Keri Deep Conditioning Original Lotion Dry Skin Formula Softly Scented  Keri Deep Conditioning Original Lotion, Fragrance Free Sensitive Skin Formula  Keri Lotion Fast Absorbing Fragrance Free Sensitive Skin Formula  Keri Lotion Fast Absorbing Softly Scented Dry Skin Formula  Keri Original Lotion  Keri Skin Renewal Lotion Keri Silky Smooth Lotion  Keri Silky Smooth Sensitive Skin Lotion    Nivea Body Creamy Conditioning Oil  Nivea Body Extra Enriched Lotion  Nivea Body Original Lotion  Nivea Body Sheer Moisturizing Lotion Nivea Crme  Nivea Skin Firming Lotion  NutraDerm 30 Skin Lotion  NutraDerm Skin Lotion  NutraDerm Therapeutic Skin Cream  NutraDerm Therapeutic Skin Lotion  ProShield Protective Hand Cream  Provon moisturizing lotion   

## 2023-02-06 NOTE — Progress Notes (Signed)
Spoke to [employer] at Dr. Marshell Levan office and made her aware of the surgical PCR results.

## 2023-02-14 ENCOUNTER — Inpatient Hospital Stay (HOSPITAL_COMMUNITY): Payer: Medicare Other | Admitting: Physician Assistant

## 2023-02-14 ENCOUNTER — Other Ambulatory Visit: Payer: Self-pay

## 2023-02-14 ENCOUNTER — Inpatient Hospital Stay (HOSPITAL_COMMUNITY)
Admission: RE | Admit: 2023-02-14 | Discharge: 2023-02-16 | DRG: 455 | Disposition: A | Discharge status: Home / Self Care | Payer: Medicare Other | Attending: Orthopedic Surgery | Admitting: Orthopedic Surgery

## 2023-02-14 ENCOUNTER — Inpatient Hospital Stay (HOSPITAL_COMMUNITY): Payer: Medicare Other

## 2023-02-14 ENCOUNTER — Encounter (HOSPITAL_COMMUNITY): Payer: Self-pay | Admitting: Orthopedic Surgery

## 2023-02-14 ENCOUNTER — Encounter (HOSPITAL_COMMUNITY)
Admission: RE | Disposition: A | Discharge status: Home / Self Care | Payer: Self-pay | Source: Home / Self Care | Attending: Orthopedic Surgery

## 2023-02-14 DIAGNOSIS — Z85828 Personal history of other malignant neoplasm of skin: Secondary | ICD-10-CM

## 2023-02-14 DIAGNOSIS — Z87891 Personal history of nicotine dependence: Secondary | ICD-10-CM

## 2023-02-14 DIAGNOSIS — Z79899 Other long term (current) drug therapy: Secondary | ICD-10-CM

## 2023-02-14 DIAGNOSIS — E785 Hyperlipidemia, unspecified: Secondary | ICD-10-CM | POA: Diagnosis present

## 2023-02-14 DIAGNOSIS — M48061 Spinal stenosis, lumbar region without neurogenic claudication: Secondary | ICD-10-CM | POA: Diagnosis present

## 2023-02-14 DIAGNOSIS — Z21 Asymptomatic human immunodeficiency virus [HIV] infection status: Secondary | ICD-10-CM | POA: Diagnosis present

## 2023-02-14 DIAGNOSIS — I10 Essential (primary) hypertension: Secondary | ICD-10-CM | POA: Diagnosis present

## 2023-02-14 DIAGNOSIS — Z83438 Family history of other disorder of lipoprotein metabolism and other lipidemia: Secondary | ICD-10-CM

## 2023-02-14 DIAGNOSIS — N400 Enlarged prostate without lower urinary tract symptoms: Secondary | ICD-10-CM | POA: Diagnosis present

## 2023-02-14 DIAGNOSIS — Z981 Arthrodesis status: Secondary | ICD-10-CM

## 2023-02-14 DIAGNOSIS — B20 Human immunodeficiency virus [HIV] disease: Secondary | ICD-10-CM | POA: Diagnosis present

## 2023-02-14 DIAGNOSIS — M4156 Other secondary scoliosis, lumbar region: Secondary | ICD-10-CM | POA: Diagnosis present

## 2023-02-14 DIAGNOSIS — M532X6 Spinal instabilities, lumbar region: Secondary | ICD-10-CM | POA: Diagnosis present

## 2023-02-14 DIAGNOSIS — Z8249 Family history of ischemic heart disease and other diseases of the circulatory system: Secondary | ICD-10-CM

## 2023-02-14 DIAGNOSIS — M5416 Radiculopathy, lumbar region: Secondary | ICD-10-CM | POA: Diagnosis present

## 2023-02-14 HISTORY — PX: ANTERIOR LAT LUMBAR FUSION: SHX1168

## 2023-02-14 SURGERY — ANTERIOR LATERAL LUMBAR FUSION 2 LEVELS
Anesthesia: General | Site: Spine Lumbar | Laterality: Left

## 2023-02-14 MED ORDER — LACTATED RINGERS IV SOLN: INTRAVENOUS | Status: DC

## 2023-02-14 MED ORDER — DEXAMETHASONE SODIUM PHOSPHATE 10 MG/ML IJ SOLN
INTRAMUSCULAR | Status: DC | PRN
  Administered 2023-02-14: 10 mg via INTRAVENOUS

## 2023-02-14 MED ORDER — PHENYLEPHRINE HCL-NACL 20-0.9 MG/250ML-% IV SOLN
INTRAVENOUS | Status: AC
  Filled 2023-02-14: qty 750

## 2023-02-14 MED ORDER — CALCIUM CARBONATE ANTACID 500 MG PO CHEW: 500.0000 mg | CHEWABLE_TABLET | Freq: Every day | ORAL | Status: DC | PRN

## 2023-02-14 MED ORDER — POLYETHYLENE GLYCOL 3350 17 GM/SCOOP PO POWD
17.0000 g | Freq: Every day | ORAL | Status: DC
  Filled 2023-02-14: qty 255

## 2023-02-14 MED ORDER — ROCURONIUM BROMIDE 10 MG/ML (PF) SYRINGE
PREFILLED_SYRINGE | INTRAVENOUS | Status: AC
  Filled 2023-02-14: qty 10

## 2023-02-14 MED ORDER — ACETAMINOPHEN 650 MG RE SUPP: 650.0000 mg | RECTAL | Status: DC | PRN

## 2023-02-14 MED ORDER — EPHEDRINE 5 MG/ML INJ
INTRAVENOUS | Status: AC
  Filled 2023-02-14: qty 5

## 2023-02-14 MED ORDER — POTASSIUM CHLORIDE CRYS ER 10 MEQ PO TBCR
10.0000 meq | EXTENDED_RELEASE_TABLET | Freq: Every day | ORAL | Status: DC
  Administered 2023-02-14 – 2023-02-16 (×3): 10 meq via ORAL
  Filled 2023-02-14 (×3): qty 1

## 2023-02-14 MED ORDER — PHENOL 1.4 % MT LIQD: 1.0000 | OROMUCOSAL | Status: DC | PRN

## 2023-02-14 MED ORDER — CEFAZOLIN SODIUM-DEXTROSE 2-4 GM/100ML-% IV SOLN: 2.0000 g | INTRAVENOUS | Status: DC

## 2023-02-14 MED ORDER — PHENYLEPHRINE 80 MCG/ML (10ML) SYRINGE FOR IV PUSH (FOR BLOOD PRESSURE SUPPORT)
PREFILLED_SYRINGE | INTRAVENOUS | Status: AC
  Filled 2023-02-14: qty 10

## 2023-02-14 MED ORDER — PHENYLEPHRINE 80 MCG/ML (10ML) SYRINGE FOR IV PUSH (FOR BLOOD PRESSURE SUPPORT)
PREFILLED_SYRINGE | INTRAVENOUS | Status: DC | PRN
  Administered 2023-02-14: 80 ug via INTRAVENOUS
  Administered 2023-02-14: 160 ug via INTRAVENOUS

## 2023-02-14 MED ORDER — ONDANSETRON HCL 4 MG PO TABS: 4.0000 mg | ORAL_TABLET | Freq: Four times a day (QID) | ORAL | Status: DC | PRN

## 2023-02-14 MED ORDER — FLEET ENEMA 7-19 GM/118ML RE ENEM: 1.0000 | ENEMA | Freq: Once | RECTAL | Status: DC | PRN

## 2023-02-14 MED ORDER — ONDANSETRON HCL 4 MG/2ML IJ SOLN
INTRAMUSCULAR | Status: AC
  Filled 2023-02-14: qty 2

## 2023-02-14 MED ORDER — POVIDONE-IODINE 7.5 % EX SOLN
Freq: Once | CUTANEOUS | Status: DC
  Filled 2023-02-14: qty 118

## 2023-02-14 MED ORDER — BISACODYL 5 MG PO TBEC
5.0000 mg | DELAYED_RELEASE_TABLET | Freq: Every day | ORAL | Status: DC | PRN
  Administered 2023-02-15: 5 mg via ORAL
  Filled 2023-02-14: qty 1

## 2023-02-14 MED ORDER — OXYCODONE-ACETAMINOPHEN 5-325 MG PO TABS
1.0000 | ORAL_TABLET | ORAL | Status: DC | PRN
  Administered 2023-02-14 – 2023-02-16 (×7): 2 via ORAL
  Filled 2023-02-14 (×7): qty 2

## 2023-02-14 MED ORDER — MORPHINE SULFATE (PF) 2 MG/ML IV SOLN: 1.0000 mg | INTRAVENOUS | Status: DC | PRN

## 2023-02-14 MED ORDER — EMTRICITAB-RILPIVIR-TENOFOV AF 200-25-25 MG PO TABS
1.0000 | ORAL_TABLET | Freq: Every day | ORAL | Status: DC
  Administered 2023-02-15 – 2023-02-16 (×2): 1 via ORAL
  Filled 2023-02-14 (×2): qty 1

## 2023-02-14 MED ORDER — POLYETHYLENE GLYCOL 3350 17 G PO PACK
17.0000 g | PACK | Freq: Every day | ORAL | Status: DC
  Administered 2023-02-14 – 2023-02-15 (×2): 17 g via ORAL
  Filled 2023-02-14 (×2): qty 1

## 2023-02-14 MED ORDER — HYDROCODONE-ACETAMINOPHEN 5-325 MG PO TABS
1.0000 | ORAL_TABLET | ORAL | Status: DC | PRN
  Administered 2023-02-15 (×2): 2 via ORAL
  Filled 2023-02-14 (×2): qty 2

## 2023-02-14 MED ORDER — HYDROCHLOROTHIAZIDE 25 MG PO TABS
50.0000 mg | ORAL_TABLET | Freq: Every day | ORAL | Status: DC
  Administered 2023-02-15 – 2023-02-16 (×2): 50 mg via ORAL
  Filled 2023-02-14 (×2): qty 2
  Filled 2023-02-14: qty 1
  Filled 2023-02-14: qty 2
  Filled 2023-02-14: qty 1

## 2023-02-14 MED ORDER — FENTANYL CITRATE (PF) 100 MCG/2ML IJ SOLN
INTRAMUSCULAR | Status: AC
  Filled 2023-02-14: qty 2

## 2023-02-14 MED ORDER — ACETAMINOPHEN 10 MG/ML IV SOLN
INTRAVENOUS | Status: AC
  Filled 2023-02-14: qty 100

## 2023-02-14 MED ORDER — MONTELUKAST SODIUM 10 MG PO TABS
10.0000 mg | ORAL_TABLET | Freq: Every day | ORAL | Status: DC
  Administered 2023-02-14 – 2023-02-15 (×2): 10 mg via ORAL
  Filled 2023-02-14 (×2): qty 1

## 2023-02-14 MED ORDER — FENTANYL CITRATE (PF) 250 MCG/5ML IJ SOLN
INTRAMUSCULAR | Status: DC | PRN
  Administered 2023-02-14: 75 ug via INTRAVENOUS
  Administered 2023-02-14: 100 ug via INTRAVENOUS
  Administered 2023-02-14: 75 ug via INTRAVENOUS

## 2023-02-14 MED ORDER — GLYCOPYRROLATE PF 0.2 MG/ML IJ SOSY
PREFILLED_SYRINGE | INTRAMUSCULAR | Status: DC | PRN
  Administered 2023-02-14: .2 mg via INTRAVENOUS

## 2023-02-14 MED ORDER — ACETAMINOPHEN 10 MG/ML IV SOLN
1000.0000 mg | Freq: Four times a day (QID) | INTRAVENOUS | Status: DC
  Administered 2023-02-14: 1000 mg via INTRAVENOUS

## 2023-02-14 MED ORDER — PHENYLEPHRINE HCL-NACL 20-0.9 MG/250ML-% IV SOLN
INTRAVENOUS | Status: DC | PRN
  Administered 2023-02-14: 40 ug/min via INTRAVENOUS

## 2023-02-14 MED ORDER — SODIUM CHLORIDE 0.9 % IV SOLN
0.0125 ug/kg/min | INTRAVENOUS | Status: AC
  Administered 2023-02-14: .1 ug/kg/min via INTRAVENOUS
  Filled 2023-02-14: qty 1000

## 2023-02-14 MED ORDER — VASOPRESSIN 20 UNIT/ML IV SOLN
INTRAVENOUS | Status: AC
  Filled 2023-02-14: qty 1

## 2023-02-14 MED ORDER — VANCOMYCIN HCL IN DEXTROSE 1-5 GM/200ML-% IV SOLN
1000.0000 mg | INTRAVENOUS | Status: AC
  Administered 2023-02-14: 1000 mg via INTRAVENOUS
  Filled 2023-02-14: qty 200

## 2023-02-14 MED ORDER — CEFAZOLIN SODIUM-DEXTROSE 2-4 GM/100ML-% IV SOLN
2.0000 g | Freq: Three times a day (TID) | INTRAVENOUS | Status: AC
  Administered 2023-02-14 (×2): 2 g via INTRAVENOUS
  Filled 2023-02-14 (×2): qty 100

## 2023-02-14 MED ORDER — SUVOREXANT 10 MG PO TABS: 1.0000 | ORAL_TABLET | Freq: Every day | ORAL | Status: DC

## 2023-02-14 MED ORDER — POTASSIUM CHLORIDE IN NACL 20-0.9 MEQ/L-% IV SOLN: INTRAVENOUS | Status: DC

## 2023-02-14 MED ORDER — SODIUM CHLORIDE 0.9% FLUSH: 3.0000 mL | INTRAVENOUS | Status: DC | PRN

## 2023-02-14 MED ORDER — ONDANSETRON HCL 4 MG/2ML IJ SOLN
INTRAMUSCULAR | Status: DC | PRN
  Administered 2023-02-14: 4 mg via INTRAVENOUS

## 2023-02-14 MED ORDER — ORAL CARE MOUTH RINSE: 15.0000 mL | Freq: Once | OROMUCOSAL | Status: AC

## 2023-02-14 MED ORDER — OXYCODONE HCL 5 MG PO TABS: 5.0000 mg | ORAL_TABLET | ORAL | Status: DC | PRN

## 2023-02-14 MED ORDER — HYDROCORTISONE ACETATE 25 MG RE SUPP
25.0000 mg | Freq: Every day | RECTAL | Status: DC
  Administered 2023-02-14 – 2023-02-15 (×2): 25 mg via RECTAL
  Filled 2023-02-14 (×3): qty 1

## 2023-02-14 MED ORDER — PROPOFOL 10 MG/ML IV BOLUS
INTRAVENOUS | Status: DC | PRN
  Administered 2023-02-14: 65 ug/kg/min via INTRAVENOUS
  Administered 2023-02-14: 100 mg via INTRAVENOUS

## 2023-02-14 MED ORDER — PROPOFOL 10 MG/ML IV BOLUS
INTRAVENOUS | Status: AC
  Filled 2023-02-14: qty 20

## 2023-02-14 MED ORDER — GABAPENTIN 300 MG PO CAPS
300.0000 mg | ORAL_CAPSULE | Freq: Three times a day (TID) | ORAL | Status: DC
  Administered 2023-02-14 – 2023-02-16 (×6): 300 mg via ORAL
  Filled 2023-02-14 (×6): qty 1

## 2023-02-14 MED ORDER — OXYCODONE HCL 5 MG PO TABS
5.0000 mg | ORAL_TABLET | Freq: Once | ORAL | Status: DC
Start: 2023-02-14 — End: 2023-02-14

## 2023-02-14 MED ORDER — CHLORHEXIDINE GLUCONATE 0.12 % MT SOLN
15.0000 mL | Freq: Once | OROMUCOSAL | Status: AC
  Administered 2023-02-14: 15 mL via OROMUCOSAL
  Filled 2023-02-14: qty 15

## 2023-02-14 MED ORDER — BUPIVACAINE-EPINEPHRINE (PF) 0.25% -1:200000 IJ SOLN
INTRAMUSCULAR | Status: AC
  Filled 2023-02-14: qty 30

## 2023-02-14 MED ORDER — ACETAMINOPHEN 10 MG/ML IV SOLN: 1000.0000 mg | Freq: Once | INTRAVENOUS | Status: DC

## 2023-02-14 MED ORDER — MIDAZOLAM HCL 2 MG/2ML IJ SOLN
INTRAMUSCULAR | Status: AC
  Filled 2023-02-14: qty 2

## 2023-02-14 MED ORDER — OXYCODONE HCL 5 MG/5ML PO SOLN
ORAL | Status: AC
  Filled 2023-02-14: qty 5

## 2023-02-14 MED ORDER — SODIUM CHLORIDE 0.9% FLUSH
3.0000 mL | Freq: Two times a day (BID) | INTRAVENOUS | Status: DC
  Administered 2023-02-14 – 2023-02-15 (×3): 3 mL via INTRAVENOUS

## 2023-02-14 MED ORDER — THROMBIN 20000 UNITS EX SOLR
CUTANEOUS | Status: DC | PRN
  Administered 2023-02-14: 20 mL via TOPICAL

## 2023-02-14 MED ORDER — CYCLOBENZAPRINE HCL 5 MG PO TABS: 5.0000 mg | ORAL_TABLET | Freq: Every evening | ORAL | Status: DC | PRN

## 2023-02-14 MED ORDER — MELATONIN 5 MG PO TABS
10.0000 mg | ORAL_TABLET | Freq: Every day | ORAL | Status: DC
  Administered 2023-02-14 – 2023-02-15 (×2): 10 mg via ORAL
  Filled 2023-02-14 (×2): qty 2

## 2023-02-14 MED ORDER — ATORVASTATIN CALCIUM 40 MG PO TABS
40.0000 mg | ORAL_TABLET | Freq: Every day | ORAL | Status: DC
  Administered 2023-02-14 – 2023-02-15 (×2): 40 mg via ORAL
  Filled 2023-02-14 (×2): qty 1

## 2023-02-14 MED ORDER — ALUM & MAG HYDROXIDE-SIMETH 200-200-20 MG/5ML PO SUSP: 30.0000 mL | Freq: Four times a day (QID) | ORAL | Status: DC | PRN

## 2023-02-14 MED ORDER — ZOLPIDEM TARTRATE 5 MG PO TABS
5.0000 mg | ORAL_TABLET | Freq: Every evening | ORAL | Status: DC | PRN
  Administered 2023-02-16: 5 mg via ORAL
  Filled 2023-02-14: qty 1

## 2023-02-14 MED ORDER — BACLOFEN 10 MG PO TABS: 20.0000 mg | ORAL_TABLET | Freq: Three times a day (TID) | ORAL | Status: DC | PRN

## 2023-02-14 MED ORDER — DEXAMETHASONE SODIUM PHOSPHATE 10 MG/ML IJ SOLN
INTRAMUSCULAR | Status: AC
  Filled 2023-02-14: qty 1

## 2023-02-14 MED ORDER — ONDANSETRON HCL 4 MG/2ML IJ SOLN
4.0000 mg | Freq: Four times a day (QID) | INTRAMUSCULAR | Status: DC | PRN
  Administered 2023-02-15: 4 mg via INTRAVENOUS

## 2023-02-14 MED ORDER — 0.9 % SODIUM CHLORIDE (POUR BTL) OPTIME
TOPICAL | Status: DC | PRN
  Administered 2023-02-14: 1000 mL

## 2023-02-14 MED ORDER — CEFAZOLIN SODIUM-DEXTROSE 2-4 GM/100ML-% IV SOLN
2.0000 g | INTRAVENOUS | Status: DC
  Filled 2023-02-14: qty 100

## 2023-02-14 MED ORDER — DOCUSATE SODIUM 100 MG PO CAPS
100.0000 mg | ORAL_CAPSULE | Freq: Two times a day (BID) | ORAL | Status: DC
  Administered 2023-02-14 – 2023-02-16 (×5): 100 mg via ORAL
  Filled 2023-02-14 (×5): qty 1

## 2023-02-14 MED ORDER — FENTANYL CITRATE (PF) 100 MCG/2ML IJ SOLN
25.0000 ug | INTRAMUSCULAR | Status: DC | PRN
  Administered 2023-02-14 (×3): 50 ug via INTRAVENOUS

## 2023-02-14 MED ORDER — ACETAMINOPHEN 325 MG PO TABS
650.0000 mg | ORAL_TABLET | ORAL | Status: DC | PRN
  Administered 2023-02-15: 325 mg via ORAL
  Filled 2023-02-14: qty 2

## 2023-02-14 MED ORDER — MIDAZOLAM HCL 2 MG/2ML IJ SOLN
INTRAMUSCULAR | Status: DC | PRN
  Administered 2023-02-14: 2 mg via INTRAVENOUS

## 2023-02-14 MED ORDER — POLYVINYL ALCOHOL 1.4 % OP SOLN
Freq: Two times a day (BID) | OPHTHALMIC | Status: DC
  Filled 2023-02-14: qty 15

## 2023-02-14 MED ORDER — CYCLOSPORINE 0.05 % OP EMUL: 1.0000 [drp] | Freq: Two times a day (BID) | OPHTHALMIC | Status: DC | PRN

## 2023-02-14 MED ORDER — SODIUM CHLORIDE 0.9 % IV SOLN
250.0000 mL | INTRAVENOUS | Status: DC
  Administered 2023-02-14: 250 mL via INTRAVENOUS

## 2023-02-14 MED ORDER — ACETAMINOPHEN 500 MG PO TABS
1000.0000 mg | ORAL_TABLET | Freq: Once | ORAL | Status: AC
  Administered 2023-02-14: 1000 mg via ORAL
  Filled 2023-02-14: qty 2

## 2023-02-14 MED ORDER — MENTHOL 3 MG MT LOZG: 1.0000 | LOZENGE | OROMUCOSAL | Status: DC | PRN

## 2023-02-14 MED ORDER — BUPIVACAINE-EPINEPHRINE 0.25% -1:200000 IJ SOLN
INTRAMUSCULAR | Status: DC | PRN
  Administered 2023-02-14: 7 mL

## 2023-02-14 MED ORDER — LIDOCAINE 2% (20 MG/ML) 5 ML SYRINGE
INTRAMUSCULAR | Status: AC
  Filled 2023-02-14: qty 5

## 2023-02-14 MED ORDER — SUCCINYLCHOLINE CHLORIDE 200 MG/10ML IV SOSY
PREFILLED_SYRINGE | INTRAVENOUS | Status: DC | PRN
  Administered 2023-02-14: 100 mg via INTRAVENOUS

## 2023-02-14 MED ORDER — FENTANYL CITRATE (PF) 250 MCG/5ML IJ SOLN
INTRAMUSCULAR | Status: AC
  Filled 2023-02-14: qty 5

## 2023-02-14 MED ORDER — DILTIAZEM GEL 2 %: 1.0000 | Freq: Every day | CUTANEOUS | Status: DC

## 2023-02-14 MED ORDER — THROMBIN 20000 UNITS EX SOLR
CUTANEOUS | Status: AC
  Filled 2023-02-14: qty 20000

## 2023-02-14 MED ORDER — ADULT MULTIVITAMIN W/MINERALS CH
1.0000 | ORAL_TABLET | Freq: Every day | ORAL | Status: DC
  Administered 2023-02-14 – 2023-02-16 (×3): 1 via ORAL
  Filled 2023-02-14 (×3): qty 1

## 2023-02-14 MED ORDER — LINACLOTIDE 145 MCG PO CAPS
290.0000 ug | ORAL_CAPSULE | Freq: Every day | ORAL | Status: DC
  Administered 2023-02-16: 290 ug via ORAL
  Filled 2023-02-14: qty 2

## 2023-02-14 MED ORDER — LIDOCAINE 2% (20 MG/ML) 5 ML SYRINGE
INTRAMUSCULAR | Status: DC | PRN
  Administered 2023-02-14: 60 mg via INTRAVENOUS

## 2023-02-14 MED ORDER — SENNOSIDES-DOCUSATE SODIUM 8.6-50 MG PO TABS: 1.0000 | ORAL_TABLET | Freq: Every evening | ORAL | Status: DC | PRN

## 2023-02-14 MED ORDER — AMISULPRIDE (ANTIEMETIC) 5 MG/2ML IV SOLN: 10.0000 mg | Freq: Once | INTRAVENOUS | Status: DC | PRN

## 2023-02-14 MED ORDER — METHOCARBAMOL 500 MG PO TABS
500.0000 mg | ORAL_TABLET | Freq: Four times a day (QID) | ORAL | Status: DC | PRN
  Administered 2023-02-14 – 2023-02-16 (×5): 500 mg via ORAL
  Filled 2023-02-14 (×6): qty 1

## 2023-02-14 SURGICAL SUPPLY — 67 items
AGENT HMST KT MTR STRL THRMB (HEMOSTASIS)
BAG COUNTER SPONGE SURGICOUNT (BAG) ×1 IMPLANT
BAG SPNG CNTER NS LX DISP (BAG) ×1
BLADE CLIPPER SURG (BLADE) IMPLANT
BLADE SURG 10 STRL SS (BLADE) ×1 IMPLANT
COVER BACK TABLE 80X110 HD (DRAPES) ×1 IMPLANT
COVER SURGICAL LIGHT HANDLE (MISCELLANEOUS) ×1 IMPLANT
DRAPE C-ARM 42X72 X-RAY (DRAPES) ×1 IMPLANT
DRAPE C-ARMOR (DRAPES) IMPLANT
DRAPE POUCH INSTRU U-SHP 10X18 (DRAPES) ×1 IMPLANT
DRAPE SURG 17X23 STRL (DRAPES) ×4 IMPLANT
DURAPREP 26ML APPLICATOR (WOUND CARE) ×1 IMPLANT
ELECT BLADE 6.5 EXT (BLADE) ×1 IMPLANT
ELECT CAUTERY BLADE 6.4 (BLADE) ×1 IMPLANT
ELECT REM PT RETURN 9FT ADLT (ELECTROSURGICAL) ×1
ELECTRODE REM PT RTRN 9FT ADLT (ELECTROSURGICAL) ×1 IMPLANT
GAUZE 4X4 16PLY ~~LOC~~+RFID DBL (SPONGE) ×1 IMPLANT
GAUZE SPONGE 4X4 12PLY STRL (GAUZE/BANDAGES/DRESSINGS) IMPLANT
GLOVE BIO SURGEON STRL SZ 6.5 (GLOVE) ×2 IMPLANT
GLOVE BIO SURGEON STRL SZ8 (GLOVE) ×1 IMPLANT
GLOVE BIOGEL PI IND STRL 7.0 (GLOVE) ×1 IMPLANT
GLOVE BIOGEL PI IND STRL 8 (GLOVE) ×1 IMPLANT
GLOVE SURG ENC MOIS LTX SZ6.5 (GLOVE) ×2 IMPLANT
GOWN STRL REUS W/ TWL LRG LVL3 (GOWN DISPOSABLE) ×1 IMPLANT
GOWN STRL REUS W/ TWL XL LVL3 (GOWN DISPOSABLE) ×2 IMPLANT
GOWN STRL REUS W/TWL LRG LVL3 (GOWN DISPOSABLE) ×3
GOWN STRL REUS W/TWL XL LVL3 (GOWN DISPOSABLE) ×1
HANDLE MIS GLBU (ORTHOPEDIC DISPOSABLE SUPPLIES) IMPLANT
KIT BASIN OR (CUSTOM PROCEDURE TRAY) ×1 IMPLANT
KIT DILATOR XLIF 5 (KITS) IMPLANT
KIT SURGICAL ACCESS MAXCESS (KITS) IMPLANT
KIT TURNOVER KIT B (KITS) ×1 IMPLANT
MARKER SKIN DUAL TIP RULER LAB (MISCELLANEOUS) ×1 IMPLANT
MIX DBX 10CC 35% BONE (Bone Implant) IMPLANT
MODULE EMG NDL SSEP NVM5 (NEUROSURGERY SUPPLIES) IMPLANT
MODULE EMG NEEDLE SSEP NVM5 (NEUROSURGERY SUPPLIES) ×1 IMPLANT
MODULE NVM5 NEXT GEN EMG (NEUROSURGERY SUPPLIES) IMPLANT
NDL HYPO 25GX1X1/2 BEV (NEEDLE) ×1 IMPLANT
NDL SPNL 18GX3.5 QUINCKE PK (NEEDLE) ×1 IMPLANT
NEEDLE HYPO 25GX1X1/2 BEV (NEEDLE) ×1 IMPLANT
NEEDLE SPNL 18GX3.5 QUINCKE PK (NEEDLE) ×1 IMPLANT
NS IRRIG 1000ML POUR BTL (IV SOLUTION) ×1 IMPLANT
PACK LAMINECTOMY ORTHO (CUSTOM PROCEDURE TRAY) ×1 IMPLANT
PACK UNIVERSAL I (CUSTOM PROCEDURE TRAY) ×1 IMPLANT
PAD ARMBOARD 7.5X6 YLW CONV (MISCELLANEOUS) ×2 IMPLANT
PUTTY BONE DBX 5CC MIX (Putty) IMPLANT
PUTTY DBX 5CC (Putty) IMPLANT
SHAFT THRD FUNNEL GL (ORTHOPEDIC DISPOSABLE SUPPLIES) IMPLANT
SPACER RISE-L 18X60 7-14MM (Spacer) IMPLANT
SPONGE INTESTINAL PEANUT (DISPOSABLE) ×2 IMPLANT
SPONGE SURGIFOAM ABS GEL 100 (HEMOSTASIS) IMPLANT
SPONGE T-LAP 4X18 ~~LOC~~+RFID (SPONGE) ×1 IMPLANT
STRIP CLOSURE SKIN 1/2X4 (GAUZE/BANDAGES/DRESSINGS) IMPLANT
SURGIFLO W/THROMBIN 8M KIT (HEMOSTASIS) IMPLANT
SUT MNCRL AB 4-0 PS2 18 (SUTURE) ×1 IMPLANT
SUT VIC AB 0 CT1 18XCR BRD 8 (SUTURE) ×1 IMPLANT
SUT VIC AB 0 CT1 8-18 (SUTURE)
SUT VIC AB 1 CT1 18XCR BRD 8 (SUTURE) IMPLANT
SUT VIC AB 1 CT1 8-18 (SUTURE) ×1
SUT VIC AB 2-0 CT2 18 VCP726D (SUTURE) ×1 IMPLANT
SYR BULB IRRIG 60ML STRL (SYRINGE) ×1 IMPLANT
SYR CONTROL 10ML LL (SYRINGE) ×1 IMPLANT
TOWEL GREEN STERILE (TOWEL DISPOSABLE) ×1 IMPLANT
TOWEL GREEN STERILE FF (TOWEL DISPOSABLE) ×1 IMPLANT
TRAY FOLEY MTR SLVR 16FR STAT (SET/KITS/TRAYS/PACK) ×1 IMPLANT
WATER STERILE IRR 1000ML POUR (IV SOLUTION) ×1 IMPLANT
YANKAUER SUCT BULB TIP NO VENT (SUCTIONS) ×1 IMPLANT

## 2023-02-14 NOTE — Anesthesia Preprocedure Evaluation (Signed)
Anesthesia Evaluation  Patient identified by MRN, date of birth, ID band Patient awake    Reviewed: Allergy & Precautions, NPO status , Patient's Chart, lab work & pertinent test results  Airway Mallampati: II  TM Distance: >3 FB Neck ROM: Full    Dental  (+) Dental Advisory Given   Pulmonary former smoker   breath sounds clear to auscultation       Cardiovascular hypertension,  Rhythm:Regular Rate:Normal     Neuro/Psych  Neuromuscular disease    GI/Hepatic negative GI ROS, Neg liver ROS,,,  Endo/Other  negative endocrine ROS    Renal/GU negative Renal ROS     Musculoskeletal  (+) Arthritis ,    Abdominal   Peds  Hematology  (+) HIV  Anesthesia Other Findings   Reproductive/Obstetrics                             Anesthesia Physical Anesthesia Plan  ASA: 3  Anesthesia Plan: General   Post-op Pain Management: Tylenol PO (pre-op)* and Ketamine IV*   Induction: Intravenous  PONV Risk Score and Plan: 2 and Dexamethasone, Ondansetron and Treatment may vary due to age or medical condition  Airway Management Planned: Oral ETT  Additional Equipment: None  Intra-op Plan:   Post-operative Plan: Extubation in OR  Informed Consent: I have reviewed the patients History and Physical, chart, labs and discussed the procedure including the risks, benefits and alternatives for the proposed anesthesia with the patient or authorized representative who has indicated his/her understanding and acceptance.     Dental advisory given  Plan Discussed with: CRNA  Anesthesia Plan Comments:        Anesthesia Quick Evaluation

## 2023-02-14 NOTE — Transfer of Care (Signed)
Immediate Anesthesia Transfer of Care Note  Patient: Troy Marsh  Procedure(s) Performed: LEFT SIDED LUMBAR THREE - LUMBAR FOUR, LUMBAR FOUR - LUMBAR FIVE LATERAL INTERBODY FUSION WITH INSTRUMENTATION AND ALLOGRAFT (Left: Spine Lumbar)  Patient Location: PACU  Anesthesia Type:General  Level of Consciousness: awake and pateint uncooperative  Airway & Oxygen Therapy: Patient Spontanous Breathing and Patient connected to face mask oxygen  Post-op Assessment: Report given to RN and Post -op Vital signs reviewed and stable  Post vital signs: Reviewed and stable  Last Vitals:  Vitals Value Taken Time  BP 111/61 02/14/23 1130  Temp 36.3 C 02/14/23 1130  Pulse 78 02/14/23 1140  Resp 16 02/14/23 1140  SpO2 100 % 02/14/23 1140  Vitals shown include unfiled device data.  Last Pain:  Vitals:   02/14/23 0621  TempSrc:   PainSc: 0-No pain         Complications: No notable events documented.

## 2023-02-14 NOTE — Anesthesia Preprocedure Evaluation (Addendum)
Anesthesia Evaluation  Patient identified by MRN, date of birth, ID band Patient awake    Reviewed: Allergy & Precautions, NPO status , Patient's Chart, lab work & pertinent test results  Airway Mallampati: II  TM Distance: >3 FB Neck ROM: Full    Dental no notable dental hx.    Pulmonary former smoker   Pulmonary exam normal        Cardiovascular hypertension, Pt. on medications  Rhythm:Regular Rate:Normal     Neuro/Psych negative neurological ROS  negative psych ROS   GI/Hepatic negative GI ROS, Neg liver ROS,,,  Endo/Other  negative endocrine ROS    Renal/GU negative Renal ROS     Musculoskeletal  (+) Arthritis , Osteoarthritis,    Abdominal Normal abdominal exam  (+)   Peds  Hematology  (+) HIV  Anesthesia Other Findings   Reproductive/Obstetrics                             Anesthesia Physical Anesthesia Plan  ASA: 3  Anesthesia Plan: General   Post-op Pain Management: Celebrex PO (pre-op)*, Tylenol PO (pre-op)* and Ketamine IV*   Induction: Intravenous  PONV Risk Score and Plan: 2 and Ondansetron, Dexamethasone and Treatment may vary due to age or medical condition  Airway Management Planned: Mask and Oral ETT  Additional Equipment: None  Intra-op Plan:   Post-operative Plan: Extubation in OR  Informed Consent: I have reviewed the patients History and Physical, chart, labs and discussed the procedure including the risks, benefits and alternatives for the proposed anesthesia with the patient or authorized representative who has indicated his/her understanding and acceptance.     Dental advisory given  Plan Discussed with: CRNA  Anesthesia Plan Comments:        Anesthesia Quick Evaluation

## 2023-02-14 NOTE — Anesthesia Procedure Notes (Signed)
Procedure Name: Intubation Date/Time: 02/14/2023 7:48 AM  Performed by: Darlina Guys, CRNAPre-anesthesia Checklist: Patient identified, Emergency Drugs available, Suction available and Patient being monitored Patient Re-evaluated:Patient Re-evaluated prior to induction Oxygen Delivery Method: Circle system utilized Preoxygenation: Pre-oxygenation with 100% oxygen Induction Type: IV induction Laryngoscope Size: Glidescope and 3 Grade View: Grade I Tube type: Oral Tube size: 7.5 mm Number of attempts: 1 Airway Equipment and Method: Rigid stylet, Bite block and Video-laryngoscopy Placement Confirmation: ETT inserted through vocal cords under direct vision, positive ETCO2 and breath sounds checked- equal and bilateral Secured at: 23 cm Tube secured with: Tape

## 2023-02-14 NOTE — Op Note (Signed)
PATIENT NAME: Troy Marsh   MEDICAL RECORD NO.:   [id number]   DATE OF BIRTH: 1950/10/15   DATE OF PROCEDURE: 02/14/2023                               OPERATIVE REPORT     PREOPERATIVE DIAGNOSES: 1. Left-sided lumbar radiculopathy (M54.16) 2. Spinal stenosis spanning L3-L5 3. L3-L5 degenerative scoliosis and instability     POSTOPERATIVE DIAGNOSES: 1. Left-sided lumbar radiculopathy (M54.16) 2. Spinal stenosis spanning L3-L5 3. L3-L5 degenerative scoliosis and instability     PROCEDURE: 1. Direct lateral interbody fusion, via a left-sided approach, L3/4, L4/5. 2. Insertion of interbody device x 2 (Globus expandable intervertebral spacers     x 2). 3. Intraoperative use of fluoroscopy. 4. Use of morselized allograft - DBX mix   SURGEON:  Estill Bamberg, MD.   ASSISTANTJason Coop, PA-C.   ANESTHESIA:  General endotracheal anesthesia.   COMPLICATIONS:  None.   DISPOSITION:  Stable.   ESTIMATED BLOOD LOSS:  Minimal.   INDICATIONS FOR SURGERY:  Briefly, Mr. Laroche is a pleasant 72 year old male who did present to me in the office with ongoing progressive left leg pain. I did feel that his symptoms were secondary to the rather obvious stenosis and instability noted on his lumbar MRI and radiographs.  He did fail multiple conservative treatment measures.  Given this, we did discuss proceeding with a staged procedure.  Specifically, a left-sided lateral interbody fusion at the levels noted above, followed by a posterior decompression and fusion procedure on the following day.  After full understanding of the risks and recovery period associated with surgery, the patient did elect to proceed.   OPERATIVE DETAILS:  On 02/14/2023, the patient was brought to surgery and general endotracheal anesthesia was administered. The patient was placed in the lateral decubitus position with the left side up. The patient's hips and knees were flexed and she was secured to the  bed. His left arm was placed on an arm board.  All bony prominences were meticulously padded.  The bed was appropriately angled in order to ensure a perfect AP and lateral trajectory during surgery.  The patient's left flank was then prepped and draped in the usual sterile fashion.  A time-out procedure was performed.   At this point, a left-sided transverse incision was made between the L4/5 and L3/4  intervertebral spaces.   The external and internal oblique musculature was dissected, and the transversalis fascia was entered, and the retroperitoneal space was noted.  The peritoneum was bluntly swept anteriorly.  I then was able to identify the psoas muscle.  I did use the initial dilator, which was advanced through the psoas muscle, and over the L4-5 intervertebral space, liberally using neurologic monitoring.  There were no neurologic structures in the immediate vicinity of the dilator.  I then sequentially dilated and placed a self-retaining retractor, which was secured to the bed using a rigid arm.  The retractor was slightly opened.  Again, using triggered EMG, it was clear that there were no neurologic structures in the immediate vicinity of the intervertebral disk space.  At this point, I performed an annulotomy at L4/5, and I did release the contralateral annulus.  I then performed a thorough and complete L4/5 intervertebral diskectomy.   I then advanced an expandable trial across the L4-5 intervertebral disc space, after which point an expandable titanium intervertebral spacer was packed with DBX mix  and expanded to approximately 10.75 mm in height.  I was very pleased with the restoration of intervertebral disc height.  The self-retaining retractor was then removed.   Through the same incision, I again entered through the psoas musculature more cephalad, overlying the region of L3-4.  The initial dilator was advanced through the psoas muscle, liberally using neurologic monitoring.   There were no neurologic structures in the immediate vicinity of the dilator.  I then sequentially dilated and placed a self-retaining retractor overlying the L3-4 intervertebral space, which was secured to the bed using a rigid arm.  The retractor was slightly opened.  Again, using triggered EMG, it was clear that there were no neurologic structures in the immediate vicinity of the intervertebral disk space.  At this point, I performed an annulotomy at L3-4, and I did release the contralateral annulus.  I then performed a thorough and complete L3/4 intervertebral diskectomy.   I then advanced an expandable trial across the L3/4 intervertebral disc space, after which point an expandable titanium intervertebral spacer was packed with DBX mix and expanded to approximately 11 mm in height.  I was very pleased with the restoration of intervertebral disc height.  The self-retaining retractor was then removed.   I was very pleased with the final AP and lateral fluoroscopic images.  The wound was then irrigated and closed in layers, using #1 Vicryl, followed by 2-0 Vicryl, followed by 4-0 Monocryl.  Sterile dressings were applied and the patient was awokened from general endotracheal anesthesia and transferred to recovery in stable condition.   Of note, Jason Coop, was my assistant throughout surgery, and did aid in retraction, suctioning, and closure from start to finish.     Estill Bamberg, MD

## 2023-02-14 NOTE — Anesthesia Postprocedure Evaluation (Signed)
Anesthesia Post Note  Patient: Troy Marsh  Procedure(s) Performed: LEFT SIDED LUMBAR THREE - LUMBAR FOUR, LUMBAR FOUR - LUMBAR FIVE LATERAL INTERBODY FUSION WITH INSTRUMENTATION AND ALLOGRAFT (Left: Spine Lumbar)     Patient location during evaluation: PACU Anesthesia Type: General Level of consciousness: awake and alert Pain management: pain level controlled Vital Signs Assessment: post-procedure vital signs reviewed and stable Respiratory status: spontaneous breathing, nonlabored ventilation, respiratory function stable and patient connected to nasal cannula oxygen Cardiovascular status: blood pressure returned to baseline and stable Postop Assessment: no apparent nausea or vomiting Anesthetic complications: no  No notable events documented.  Last Vitals:  Vitals:   02/14/23 1245 02/14/23 1315  BP: 127/68 (!) 153/83  Pulse: 70 71  Resp: 17 20  Temp: 36.6 C 36.6 C  SpO2: 95% 98%    Last Pain:  Vitals:   02/14/23 1315  TempSrc: Oral  PainSc:                  Kennieth Rad

## 2023-02-14 NOTE — H&P (Signed)
PREOPERATIVE H&P  Chief Complaint: Left leg pain  HPI: Troy Marsh is a 72 y.o. male who presents with ongoing pain in the left leg  MRI reveals severe stenosis and a degenerative lumbar scoliosis  Patient has failed multiple forms of conservative care and continues to have pain (see office notes for additional details regarding the patient's full course of treatment)  Past Medical History:  Diagnosis Date   Arthritis    BPH (benign prostatic hyperplasia)    [facility]  DR. Kimberlee Nearing   Cancer [facility])    Skin cancer- basal and squamous   Closed fracture of nasal bones 06/02/2022   Constipation due to pain medication    HIV disease (HCC) 1985   last CD4 10/02/12 was 277   Hyperlipidemia    Hypertension    Hypogonadism male    Past Surgical History:  Procedure Laterality Date   ANTERIOR CERVICAL DECOMP/DISCECTOMY FUSION Bilateral 11/22/2017   Procedure: ANTERIOR CERVICAL DECOMPRESSION FUSION CERVICAL 4-5, CERVICAL 5-6, CERVICAL 6-7 WITH INSTRUMENTATION AND ALLOGRAFT TIME      /   REQUESTED 4 HOURS;  Surgeon: Estill Bamberg, MD;  Location: [facility] OR;  Service: Orthopedics;  Laterality: Bilateral;   BACK SURGERY     COLONOSCOPY W/ POLYPECTOMY     EYE SURGERY Bilateral    Lasik, Cataract   HEMORRHOID SURGERY  1973   INGUINAL HERNIA REPAIR Right 06/11/2018   Procedure: LAPAROSCOPIC RIGHT INGUINAL HERNIA WITH MESH;  Surgeon: Axel Filler, MD;  Location: [facility] OR;  Service: General;  Laterality: Right;   INSERTION OF MESH Right 06/11/2018   Procedure: INSERTION OF MESH;  Surgeon: Axel Filler, MD;  Location: [facility] OR;  Service: General;  Laterality: Right;   KNEE ARTHROSCOPY Right 2009   KNEE ARTHROSCOPY Left 2011   neck surgery  1984   POSTERIOR CERVICAL FUSION/FORAMINOTOMY N/A 05/29/2018   Procedure: POSTERIOR CERVICAL DECOMPRESSION FUSION CERVICAL 4 - CERVICAL 7 WITH INSTRUMENTATION AND ALLOGRAFT;  Surgeon: Estill Bamberg, MD;  Location: [facility] OR;  Service: Orthopedics;   Laterality: N/A;   Social History   Socioeconomic History   Marital status: Married    Spouse name: Fredrik Cove (partner)   Number of children: Not on file   Years of education: 16   Highest education level: Not on file  Occupational History   Occupation: retired    Associate Professor: [employer]    Comment: government work  Tobacco Use   Smoking status: Former    Current packs/day: 0.00    Average packs/day: 2.0 packs/day for 35.0 years (70.0 ttl pk-yrs)    Types: Cigarettes    Start date: 02/09/1969    Quit date: 02/10/2004    Years since quitting: 19.0   Smokeless tobacco: Never  Vaping Use   Vaping status: Never Used  Substance and Sexual Activity   Alcohol use: No    Comment: quit 2004   Drug use: No   Sexual activity: Yes    Partners: Male    Birth control/protection: Condom    Comment: monogamous w/ single male partner 1981  Other Topics Concern   Not on file  Social History Narrative   [employer]. Work - [employer] - retired 2009. Long term monogamous relationship. Lives in his own home.             Social Determinants of Health   Financial Resource Strain: Low Risk  (07/27/2022)   Overall Financial Resource Strain (CARDIA)    Difficulty of  Paying Living Expenses: Not hard at all  Food Insecurity: No Food Insecurity (07/27/2022)   Hunger Vital Sign    Worried About Running Out of Food in the Last Year: Never true    Ran Out of Food in the Last Year: Never true  Transportation Needs: No Transportation Needs (07/27/2022)   PRAPARE - Administrator, Civil Service (Medical): No    Lack of Transportation (Non-Medical): No  Physical Activity: Sufficiently Active (07/27/2022)   Exercise Vital Sign    Days of Exercise per Week: 7 days    Minutes of Exercise per Session: 30 min  Stress: No Stress Concern Present (07/27/2022)   [employer] of Occupational Health - Occupational Stress Questionnaire    Feeling of Stress  : Not at all  Social Connections: Unknown (07/27/2022)   Social Connection and Isolation Panel [NHANES]    Frequency of Communication with Friends and Family: Once a week    Frequency of Social Gatherings with Friends and Family: Patient declined    Attends Religious Services: More than 4 times per year    Active Member of [employer] or Organizations: Yes    Attends Engineer, structural: 1 to 4 times per year    Marital Status: Married   Family History  Problem Relation Age of Onset   Coronary artery disease Father    Heart attack Father    Hypertension Father    Hyperlipidemia Father    Cancer Father        prostate   Colitis Maternal Grandmother    Diabetes Neg Hx    Colon cancer Neg Hx    Rectal cancer Neg Hx    Stomach cancer Neg Hx    Esophageal cancer Neg Hx    No Known Allergies Prior to Admission medications   Medication Sig Start Date End Date Taking? Authorizing Provider  baclofen (LIORESAL) 20 MG tablet Take 1 tablet (20 mg total) by mouth 3 (three) times daily. Patient taking differently: Take 20 mg by mouth 3 (three) times daily as needed for muscle spasms. 05/13/20  Yes Plotnikov, Georgina Quint, MD  calcium carbonate (TUMS - DOSED IN MG ELEMENTAL CALCIUM) 500 MG chewable tablet Chew 500 mg by mouth daily as needed for indigestion or heartburn.   Yes [provider]  Carboxymethylcellulose Sodium (THERATEARS OP) Place 1 drop into both eyes 2 (two) times daily.   Yes [provider]  cetirizine-pseudoephedrine (ZYRTEC-D) 5-120 MG tablet Take 1 tablet by mouth daily as needed for allergies.    Yes [provider]  diltiazem 2 % GEL Diltiazem gel 2% apply rectally twice daily for 4 weeks Patient taking differently: Apply 1 Application topically at bedtime. 06/22/21  Yes Lynann Bologna, MD  emtricitabine-rilpivir-tenofovir AF (ODEFSEY) 200-25-25 MG TABS tablet Take 1 tablet by mouth daily. 04/10/16  Yes [provider]  gabapentin  (NEURONTIN) 300 MG capsule Take 300 mg by mouth 3 (three) times daily. 12/01/20  Yes [provider]  hydrochlorothiazide (HYDRODIURIL) 50 MG tablet Take 1 tablet (50 mg total) by mouth daily. 05/25/22  Yes Plotnikov, Georgina Quint, MD  HYDROcodone-acetaminophen (NORCO) 7.5-325 MG tablet Take 1 tablet by mouth every 6 (six) hours as needed for severe pain. 05/22/22  Yes Plotnikov, Georgina Quint, MD  ibuprofen (ADVIL) 200 MG tablet Take 200-400 mg by mouth every 8 (eight) hours as needed for moderate pain.   Yes [provider]  linaclotide (LINZESS) 290 MCG CAPS capsule Take 1 capsule (290 mcg total) by  mouth daily before breakfast. 06/27/22  Yes Lynann Bologna, MD  Melatonin 10 MG TABS Take 10 mg by mouth at bedtime.   Yes [provider]  methocarbamol (ROBAXIN) 500 MG tablet Take 1 tablet by mouth 4 (four) times daily as needed for muscle spasms. 07/03/20  Yes [provider]  montelukast (SINGULAIR) 10 MG tablet Take 1 tablet (10 mg total) by mouth daily. 06/05/22  Yes Plotnikov, Georgina Quint, MD  Multiple Vitamin (MULTIVITAMIN WITH MINERALS) TABS tablet Take 1 tablet by mouth daily. COMPLETE MULTIVITAMIN MEN'S 50+   Yes [provider]  polyethylene glycol powder (GLYCOLAX/MIRALAX) powder Take 17 g by mouth at bedtime.   Yes [provider]  potassium chloride (KLOR-CON M) 10 MEQ tablet TAKE 1 TABLET BY MOUTH  DAILY 04/03/22  Yes Plotnikov, Georgina Quint, MD  RESTASIS 0.05 % ophthalmic emulsion Place 1 drop into both eyes 2 (two) times daily as needed (dry eyes). 10/25/15  Yes [provider]  Suvorexant (BELSOMRA) 10 MG TABS TAKE 1 TABLET BY MOUTH AT  BEDTIME AS NEEDED Patient taking differently: Take 1 tablet by mouth at bedtime. 01/31/23  Yes Plotnikov, Georgina Quint, MD  tadalafil (CIALIS) 5 MG tablet TAKE 1 TABLET EVERY DAY 10/18/22  Yes Plotnikov, Georgina Quint, MD  testosterone enanthate (DELATESTRYL) 200 MG/ML injection Inject 1 mL (200 mg total) into the  muscle every 14 (fourteen) days. 10/18/22  Yes Plotnikov, Georgina Quint, MD  alprostadil (CAVERJECT IMPULSE) 20 MCG injection INJECT 20 MCG BY INTRACAVITARY  ROUTE AS DIRECTED AS NEEDED FOR  ERECTILE DYSFUNCTION. USE NO  MORE THAN 3 TIMES WEEKLY 07/14/22   Plotnikov, Georgina Quint, MD  aspirin 81 MG tablet Take 81 mg by mouth daily.    [provider]  atorvastatin (LIPITOR) 40 MG tablet Take 40 mg by mouth at bedtime.  11/29/15 01/31/23  [provider]  B-D BLUNT FILL NEEDLE 18G X 1-1/2" MISC USE AS INSTRUCTED 07/19/22   Plotnikov, Georgina Quint, MD  BD DISP NEEDLES 21G X 1-1/2" MISC USE AS DIRECTED 12/23/18   Plotnikov, Georgina Quint, MD  cyclobenzaprine (FLEXERIL) 5 MG tablet Take 5-10 mg by mouth at bedtime as needed for muscle spasms. 06/26/22   [provider]  hydrocortisone (ANUSOL-HC) 25 MG suppository Place 1 suppository (25 mg total) rectally 2 (two) times daily. Use for 10 days after a BM and then use as needed Patient taking differently: Place 25 mg rectally at bedtime. 06/27/22   Lynann Bologna, MD     All other systems have been reviewed and were otherwise negative with the exception of those mentioned in the HPI and as above.  Physical Exam: Vitals:   02/14/23 0559  BP: (!) 143/75  Resp: 18  Temp: 98.2 F (36.8 C)  SpO2: 100%    Body mass index is 18.65 kg/m.  General: Alert, no acute distress Cardiovascular: No pedal edema Respiratory: No cyanosis, no use of accessory musculature Skin: No lesions in the area of chief complaint Neurologic: Sensation intact distally Psychiatric: Patient is competent for consent with normal mood and affect Lymphatic: No axillary or cervical lymphadenopathy   Assessment/Plan: LUMBAR STENOSIS AND SCOLIOSIS, L3-L5 Plan for Procedure(s): LEFT-SIDED LUMBAR 3- LUMBAR 4, LUMBAR 4- LUMBAR 5 LATERAL INTERBODY FUSION WITH INSTRUMENTATION AND ALLOGRAFT   Jackelyn Hoehn, MD 02/14/2023 7:18 AM

## 2023-02-15 ENCOUNTER — Inpatient Hospital Stay (HOSPITAL_COMMUNITY): Payer: Medicare Other

## 2023-02-15 ENCOUNTER — Encounter (HOSPITAL_COMMUNITY)
Admission: RE | Disposition: A | Discharge status: Home / Self Care | Payer: Self-pay | Source: Home / Self Care | Attending: Orthopedic Surgery

## 2023-02-15 ENCOUNTER — Inpatient Hospital Stay (HOSPITAL_COMMUNITY): Payer: Medicare Other | Admitting: Anesthesiology

## 2023-02-15 ENCOUNTER — Telehealth: Payer: Self-pay | Admitting: Internal Medicine

## 2023-02-15 ENCOUNTER — Encounter (HOSPITAL_COMMUNITY): Payer: Self-pay | Admitting: Orthopedic Surgery

## 2023-02-15 ENCOUNTER — Inpatient Hospital Stay (HOSPITAL_COMMUNITY): Admission: RE | Admit: 2023-02-15 | Payer: Medicare Other | Source: Home / Self Care | Admitting: Orthopedic Surgery

## 2023-02-15 DIAGNOSIS — M48061 Spinal stenosis, lumbar region without neurogenic claudication: Secondary | ICD-10-CM

## 2023-02-15 DIAGNOSIS — I10 Essential (primary) hypertension: Secondary | ICD-10-CM

## 2023-02-15 DIAGNOSIS — M5416 Radiculopathy, lumbar region: Secondary | ICD-10-CM

## 2023-02-15 DIAGNOSIS — Z87891 Personal history of nicotine dependence: Secondary | ICD-10-CM

## 2023-02-15 SURGERY — POSTERIOR LUMBAR FUSION 2 LEVEL
Anesthesia: General | Site: Spine Lumbar

## 2023-02-15 MED ORDER — ONDANSETRON HCL 4 MG/2ML IJ SOLN
INTRAMUSCULAR | Status: AC
  Filled 2023-02-15: qty 2

## 2023-02-15 MED ORDER — BUPIVACAINE LIPOSOME 1.3 % IJ SUSP
INTRAMUSCULAR | Status: DC | PRN
  Administered 2023-02-15: 20 mL

## 2023-02-15 MED ORDER — MIDAZOLAM HCL 2 MG/2ML IJ SOLN
INTRAMUSCULAR | Status: AC
  Filled 2023-02-15: qty 2

## 2023-02-15 MED ORDER — CELECOXIB 200 MG PO CAPS
200.0000 mg | ORAL_CAPSULE | Freq: Once | ORAL | Status: AC
  Administered 2023-02-15: 200 mg via ORAL
  Filled 2023-02-15: qty 1

## 2023-02-15 MED ORDER — KETAMINE HCL 10 MG/ML IJ SOLN
INTRAMUSCULAR | Status: DC | PRN
  Administered 2023-02-15 (×2): 25 mg via INTRAVENOUS

## 2023-02-15 MED ORDER — METHYLENE BLUE (ANTIDOTE) 1 % IV SOLN
INTRAVENOUS | Status: AC
  Filled 2023-02-15: qty 10

## 2023-02-15 MED ORDER — OXYCODONE-ACETAMINOPHEN 5-325 MG PO TABS: 1.0000 | ORAL_TABLET | ORAL | 0 refills | Status: DC | PRN

## 2023-02-15 MED ORDER — LIDOCAINE 2% (20 MG/ML) 5 ML SYRINGE
INTRAMUSCULAR | Status: AC
  Filled 2023-02-15: qty 5

## 2023-02-15 MED ORDER — SUCCINYLCHOLINE CHLORIDE 200 MG/10ML IV SOSY
PREFILLED_SYRINGE | INTRAVENOUS | Status: AC
  Filled 2023-02-15: qty 10

## 2023-02-15 MED ORDER — 0.9 % SODIUM CHLORIDE (POUR BTL) OPTIME
TOPICAL | Status: DC | PRN
  Administered 2023-02-15: 1000 mL

## 2023-02-15 MED ORDER — CHLORHEXIDINE GLUCONATE 0.12 % MT SOLN
15.0000 mL | Freq: Once | OROMUCOSAL | Status: AC
  Administered 2023-02-15: 15 mL via OROMUCOSAL

## 2023-02-15 MED ORDER — HYDROMORPHONE HCL 1 MG/ML IJ SOLN: 0.2500 mg | INTRAMUSCULAR | Status: DC | PRN

## 2023-02-15 MED ORDER — DEXAMETHASONE SODIUM PHOSPHATE 10 MG/ML IJ SOLN
INTRAMUSCULAR | Status: AC
  Filled 2023-02-15: qty 1

## 2023-02-15 MED ORDER — SUGAMMADEX SODIUM 200 MG/2ML IV SOLN
INTRAVENOUS | Status: DC | PRN
  Administered 2023-02-15: 200 mg via INTRAVENOUS

## 2023-02-15 MED ORDER — CHLORHEXIDINE GLUCONATE 0.12 % MT SOLN
15.0000 mL | Freq: Once | OROMUCOSAL | Status: AC
  Administered 2023-02-15: 15 mL via OROMUCOSAL
  Filled 2023-02-15: qty 15

## 2023-02-15 MED ORDER — ROCURONIUM BROMIDE 10 MG/ML (PF) SYRINGE
PREFILLED_SYRINGE | INTRAVENOUS | Status: AC
  Filled 2023-02-15: qty 10

## 2023-02-15 MED ORDER — DEXAMETHASONE SODIUM PHOSPHATE 10 MG/ML IJ SOLN
INTRAMUSCULAR | Status: DC | PRN
  Administered 2023-02-15: 5 mg via INTRAVENOUS

## 2023-02-15 MED ORDER — BUPIVACAINE-EPINEPHRINE (PF) 0.25% -1:200000 IJ SOLN
INTRAMUSCULAR | Status: AC
  Filled 2023-02-15: qty 30

## 2023-02-15 MED ORDER — PROPOFOL 10 MG/ML IV BOLUS
INTRAVENOUS | Status: AC
  Filled 2023-02-15: qty 20

## 2023-02-15 MED ORDER — LACTATED RINGERS IV SOLN: INTRAVENOUS | Status: DC

## 2023-02-15 MED ORDER — LACTATED RINGERS IV SOLN: INTRAVENOUS | Status: DC | PRN

## 2023-02-15 MED ORDER — ALBUMIN HUMAN 5 % IV SOLN: INTRAVENOUS | Status: DC | PRN

## 2023-02-15 MED ORDER — ORAL CARE MOUTH RINSE: 15.0000 mL | Freq: Once | OROMUCOSAL | Status: AC

## 2023-02-15 MED ORDER — BUPIVACAINE-EPINEPHRINE 0.25% -1:200000 IJ SOLN
INTRAMUSCULAR | Status: DC | PRN
  Administered 2023-02-15: 30 mL

## 2023-02-15 MED ORDER — CEFAZOLIN SODIUM-DEXTROSE 2-4 GM/100ML-% IV SOLN
INTRAVENOUS | Status: AC
  Filled 2023-02-15: qty 100

## 2023-02-15 MED ORDER — ACETAMINOPHEN 500 MG PO TABS: 1000.0000 mg | ORAL_TABLET | Freq: Once | ORAL | Status: DC

## 2023-02-15 MED ORDER — MIDAZOLAM HCL 2 MG/2ML IJ SOLN
INTRAMUSCULAR | Status: DC | PRN
  Administered 2023-02-15: 2 mg via INTRAVENOUS

## 2023-02-15 MED ORDER — EPHEDRINE SULFATE-NACL 50-0.9 MG/10ML-% IV SOSY
PREFILLED_SYRINGE | INTRAVENOUS | Status: DC | PRN
Start: 2023-02-15 — End: 2023-02-15
  Administered 2023-02-15 (×2): 10 mg via INTRAVENOUS
  Administered 2023-02-15: 5 mg via INTRAVENOUS

## 2023-02-15 MED ORDER — FENTANYL CITRATE (PF) 250 MCG/5ML IJ SOLN
INTRAMUSCULAR | Status: AC
  Filled 2023-02-15: qty 5

## 2023-02-15 MED ORDER — ROCURONIUM BROMIDE 10 MG/ML (PF) SYRINGE
PREFILLED_SYRINGE | INTRAVENOUS | Status: DC | PRN
  Administered 2023-02-15: 50 mg via INTRAVENOUS
  Administered 2023-02-15 (×2): 10 mg via INTRAVENOUS

## 2023-02-15 MED ORDER — METHOCARBAMOL 500 MG PO TABS: 500.0000 mg | ORAL_TABLET | Freq: Four times a day (QID) | ORAL | 2 refills | Status: DC | PRN

## 2023-02-15 MED ORDER — FENTANYL CITRATE (PF) 250 MCG/5ML IJ SOLN
INTRAMUSCULAR | Status: DC | PRN
  Administered 2023-02-15: 100 ug via INTRAVENOUS

## 2023-02-15 MED ORDER — LIDOCAINE 2% (20 MG/ML) 5 ML SYRINGE
INTRAMUSCULAR | Status: DC | PRN
  Administered 2023-02-15: 60 mg via INTRAVENOUS

## 2023-02-15 MED ORDER — CEFAZOLIN SODIUM-DEXTROSE 2-3 GM-%(50ML) IV SOLR
INTRAVENOUS | Status: DC | PRN
  Administered 2023-02-15: 2 g via INTRAVENOUS

## 2023-02-15 MED ORDER — PROPOFOL 10 MG/ML IV BOLUS
INTRAVENOUS | Status: DC | PRN
  Administered 2023-02-15: 160 mg via INTRAVENOUS

## 2023-02-15 MED ORDER — KETAMINE HCL 50 MG/5ML IJ SOSY
PREFILLED_SYRINGE | INTRAMUSCULAR | Status: AC
  Filled 2023-02-15: qty 5

## 2023-02-15 MED ORDER — THROMBIN 20000 UNITS EX SOLR
CUTANEOUS | Status: AC
  Filled 2023-02-15: qty 20000

## 2023-02-15 MED ORDER — BUPIVACAINE LIPOSOME 1.3 % IJ SUSP
INTRAMUSCULAR | Status: AC
  Filled 2023-02-15: qty 20

## 2023-02-15 MED ORDER — PHENYLEPHRINE HCL-NACL 20-0.9 MG/250ML-% IV SOLN
INTRAVENOUS | Status: DC | PRN
  Administered 2023-02-15: 25 ug/min via INTRAVENOUS

## 2023-02-15 MED ORDER — THROMBIN 20000 UNITS EX SOLR: OROMUCOSAL | Status: DC | PRN

## 2023-02-15 MED ORDER — ENSURE PRE-SURGERY PO LIQD
296.0000 mL | Freq: Once | ORAL | Status: AC
  Administered 2023-02-15: 296 mL via ORAL
  Filled 2023-02-15: qty 296

## 2023-02-15 SURGICAL SUPPLY — 86 items
AGENT HMST KT MTR STRL THRMB (HEMOSTASIS)
APL SKNCLS STERI-STRIP NONHPOA (GAUZE/BANDAGES/DRESSINGS) ×1
BAG COUNTER SPONGE SURGICOUNT (BAG) ×1 IMPLANT
BAG SPNG CNTER NS LX DISP (BAG) ×1
BENZOIN TINCTURE PRP APPL 2/3 (GAUZE/BANDAGES/DRESSINGS) IMPLANT
BLADE CLIPPER SURG (BLADE) IMPLANT
BUR PRESCISION 1.7 ELITE (BURR) IMPLANT
BUR ROUND FLUTED 5 RND (BURR) IMPLANT
BUR ROUND PRECISION 4.0 (BURR) IMPLANT
BUR SABER RD CUTTING 3.0 (BURR) IMPLANT
CNTNR URN SCR LID CUP LEK RST (MISCELLANEOUS) ×1 IMPLANT
CONT SPEC 4OZ STRL OR WHT (MISCELLANEOUS) ×1
COVER SURGICAL LIGHT HANDLE (MISCELLANEOUS) ×1 IMPLANT
DRAIN CHANNEL 15F RND FF W/TCR (WOUND CARE) ×1 IMPLANT
DRAPE C-ARM 42X72 X-RAY (DRAPES) ×1 IMPLANT
DRAPE C-ARMOR (DRAPES) IMPLANT
DRAPE POUCH INSTRU U-SHP 10X18 (DRAPES) ×1 IMPLANT
DRAPE SURG 17X23 STRL (DRAPES) ×4 IMPLANT
DRSG MEPILEX POST OP 4X12 (GAUZE/BANDAGES/DRESSINGS) IMPLANT
DRSG MEPILEX POST OP 4X8 (GAUZE/BANDAGES/DRESSINGS) IMPLANT
DURAPREP 26ML APPLICATOR (WOUND CARE) ×1 IMPLANT
ELECT BLADE 4.0 EZ CLEAN MEGAD (MISCELLANEOUS) ×1
ELECT CAUTERY BLADE 6.4 (BLADE) ×2 IMPLANT
ELECT REM PT RETURN 9FT ADLT (ELECTROSURGICAL) ×1
ELECTRODE BLDE 4.0 EZ CLN MEGD (MISCELLANEOUS) ×1 IMPLANT
ELECTRODE REM PT RTRN 9FT ADLT (ELECTROSURGICAL) ×1 IMPLANT
EVACUATOR SILICONE 100CC (DRAIN) ×1 IMPLANT
GAUZE 4X4 16PLY ~~LOC~~+RFID DBL (SPONGE) ×2 IMPLANT
GAUZE SPONGE 4X4 12PLY STRL (GAUZE/BANDAGES/DRESSINGS) ×1 IMPLANT
GLOVE BIO SURGEON STRL SZ 6.5 (GLOVE) ×1 IMPLANT
GLOVE BIO SURGEON STRL SZ8 (GLOVE) ×1 IMPLANT
GLOVE BIOGEL PI IND STRL 7.0 (GLOVE) ×1 IMPLANT
GLOVE BIOGEL PI IND STRL 8 (GLOVE) ×1 IMPLANT
GLOVE SURG ENC MOIS LTX SZ6.5 (GLOVE) ×1 IMPLANT
GOWN STRL REUS W/ TWL LRG LVL3 (GOWN DISPOSABLE) ×2 IMPLANT
GOWN STRL REUS W/ TWL XL LVL3 (GOWN DISPOSABLE) ×1 IMPLANT
GOWN STRL REUS W/TWL LRG LVL3 (GOWN DISPOSABLE) ×2
GOWN STRL REUS W/TWL XL LVL3 (GOWN DISPOSABLE) ×1
GOWN STRL SURGICAL XL XLNG (GOWN DISPOSABLE) IMPLANT
GUIDEWIRE SHARP VIPER II (WIRE) IMPLANT
IV CATH 14GX2 1/4 (CATHETERS) ×1 IMPLANT
KIT ALARA NEURO ACCESS (KITS) IMPLANT
KIT BASIN OR (CUSTOM PROCEDURE TRAY) ×1 IMPLANT
KIT POSITION SURG JACKSON T1 (MISCELLANEOUS) ×1 IMPLANT
KIT TURNOVER KIT B (KITS) ×1 IMPLANT
MARKER SKIN DUAL TIP RULER LAB (MISCELLANEOUS) ×1 IMPLANT
NDL HYPO 25GX1X1/2 BEV (NEEDLE) ×1 IMPLANT
NDL SPNL 18GX3.5 QUINCKE PK (NEEDLE) ×2 IMPLANT
NEEDLE HYPO 25GX1X1/2 BEV (NEEDLE) ×1 IMPLANT
NEEDLE SPNL 18GX3.5 QUINCKE PK (NEEDLE) ×2 IMPLANT
NS IRRIG 1000ML POUR BTL (IV SOLUTION) ×1 IMPLANT
PACK LAMINECTOMY ORTHO (CUSTOM PROCEDURE TRAY) ×1 IMPLANT
PACK UNIVERSAL I (CUSTOM PROCEDURE TRAY) ×1 IMPLANT
PAD ARMBOARD 7.5X6 YLW CONV (MISCELLANEOUS) ×2 IMPLANT
PATTIES SURGICAL .5 X1 (DISPOSABLE) ×1 IMPLANT
PATTIES SURGICAL .5 X3 (DISPOSABLE) IMPLANT
PATTIES SURGICAL .5X1.5 (GAUZE/BANDAGES/DRESSINGS) ×1 IMPLANT
PATTIES SURGICAL .75X.75 (GAUZE/BANDAGES/DRESSINGS) ×1 IMPLANT
PUTTY DBX 2.5CC (Putty) ×1 IMPLANT
PUTTY DBX 2.5CC DEPUY (Putty) IMPLANT
ROD PREBENT VIPER 5.5X55MM (Rod) IMPLANT
ROD VIPER2 5.5X65MM (Rod) IMPLANT
SCREW SET SINGLE INNER MIS (Screw) IMPLANT
SCREW XTAB POLY VIPER 7X45 (Screw) IMPLANT
SPONGE INTESTINAL PEANUT (DISPOSABLE) IMPLANT
SPONGE SURGIFOAM ABS GEL 100 (HEMOSTASIS) IMPLANT
STRIP CLOSURE SKIN 1/2X4 (GAUZE/BANDAGES/DRESSINGS) IMPLANT
SURGIFLO W/THROMBIN 8M KIT (HEMOSTASIS) IMPLANT
SUT MNCRL AB 4-0 PS2 18 (SUTURE) ×1 IMPLANT
SUT VIC AB 0 CT1 18XCR BRD 8 (SUTURE) ×2 IMPLANT
SUT VIC AB 0 CT1 8-18 (SUTURE) ×2
SUT VIC AB 1 CT1 18XCR BRD 8 (SUTURE) ×2 IMPLANT
SUT VIC AB 1 CT1 8-18 (SUTURE) ×2
SUT VIC AB 2-0 CT2 18 VCP726D (SUTURE) ×2 IMPLANT
SYR 20ML LL LF (SYRINGE) ×1 IMPLANT
SYR BULB IRRIG 60ML STRL (SYRINGE) ×1 IMPLANT
SYR CONTROL 10ML LL (SYRINGE) ×2 IMPLANT
SYR TB 1ML LUER SLIP (SYRINGE) ×1 IMPLANT
TAP CANN VIPER2 DL 6.0 (TAP) IMPLANT
TAP CANN VIPER2 DL 7.0 (TAP) IMPLANT
TAPE CLOTH SOFT 2X10 (GAUZE/BANDAGES/DRESSINGS) IMPLANT
TOWEL GREEN STERILE (TOWEL DISPOSABLE) ×1 IMPLANT
TOWEL GREEN STERILE FF (TOWEL DISPOSABLE) ×1 IMPLANT
TRAY FOLEY MTR SLVR 16FR STAT (SET/KITS/TRAYS/PACK) ×1 IMPLANT
WATER STERILE IRR 1000ML POUR (IV SOLUTION) ×1 IMPLANT
YANKAUER SUCT BULB TIP NO VENT (SUCTIONS) ×1 IMPLANT

## 2023-02-15 NOTE — Op Note (Signed)
PATIENT NAME: Troy Marsh   MEDICAL RECORD NO.:   [id number]    DATE OF BIRTH: 04-15-1951   DATE OF PROCEDURE: 02/15/2023                                OPERATIVE REPORT     PREOPERATIVE DIAGNOSES: 1.  Status post L3/4 and L4-5 lateral interbody fusion on 02/14/2023, requiring a posterior fusion and decompression with instrumentation 2. Left-sided lumbar radiculopathy (M54.16) 3. Spinal stenosis spanning L3-L5 4. L3-L5 degenerative scoliosis and instability   POSTOPERATIVE DIAGNOSES:   1.  Status post L3/4 and L4-5 lateral interbody fusion on 02/14/2023, requiring a posterior fusion and decompression with instrumentation 2. Left-sided lumbar radiculopathy (M54.16) 3. Spinal stenosis spanning L3-L5 4. L3-L5 degenerative scoliosis and instability     PROCEDURE (Stage 2): 1. Posterior spinal fusion, L3/4 and L4-5 2. Posterior decompression L3/4, and L3/4 3. Placement of posterior segmental instrumentation, L3, L4 and L5, bilaterally. 4. Use of morselized allograft - DBX putty. 5. Intraoperative use of floroscopy   SURGEON:  Estill Bamberg, MD   ASSISTANT:  Jason Coop, PA-C   ANESTHESIA:  General endotracheal anesthesia.   COMPLICATIONS:  None.   DISPOSITION:  Stable.   ESTIMATED BLOOD LOSS:  Minimal   INDICATIONS FOR SURGERY: Briefly, Mr. Dietert is one day status post a lateral lumbar fusion as noted above.  Please refer to my operative report dated 02/14/2023, for a full account of the patient's preoperative history and indications for surgery.  The patient did present today for stage 2 of what was to be a 2-staged procedure.   OPERATIVE DETAILS:  On 02/15/2023, the patient was brought to surgery and general endotracheal anesthesia was administered.  The patient was placed prone onto a Jackson spinal bed.  The back was then prepped and draped in the usual sterile fashion.  I then made paramedian incisions on the right and left sides, just lateral to the lateral  borders of the pedicles at L3-4 and L4-5.  On the left side, the posterolateral gutter and posterior elements at the L3-4 and L4-5 levels were identified and exposed and decorticated.  DBX putty was packed into the posterolateral gutters on the left side to aid in the success of the posterior fusion.  Also, on the patient's left side, I did use a high-speed bur to perform a thorough lateral recess decompression at L3-4 and L4-5, performing a partial facetectomy.  This did decompress the lateral recess on the left side at these levels. I then tapped the L3, L4 and L5, pedicles bilaterally using a 6 mm tap. I then placed 7 x 45 mm screws bilaterally at L3, L4 and L5.  Rods were then secured into the tulip heads of the screws bilaterally.  Caps were then placed and a final locking procedure was performed.  I was very pleased with the final AP and lateral fluoroscopic images.  The wound was then copiously irrigated.  On the right and left sides, the fascia was closed using #1 Vicryl.  The subcutaneous layer was closed using 0 Vicryl followed by 2-0 Vicryl, and the skin was then closed using 4-0 Monocryl. Benzoin and Steri-Strips were applied followed by sterile dressing.  All instrument counts were correct at the termination of the procedure.    Of note, Jason Coop was my assistant throughout surgery, and did aid in retraction, suctioning, placement of the hardware, and closure for both the anterior and  posterior portions of the procedure.      Estill Bamberg, MD

## 2023-02-15 NOTE — Telephone Encounter (Signed)
Patient pharmacy called requesting refill on testosterone enanthate (DELATESTRYL) 200 MG/ML injection, best phone number is [phone] and fax is [phone].

## 2023-02-15 NOTE — Progress Notes (Signed)
OT Cancellation Note  Patient Details Name: Troy Marsh MRN: [id number] DOB: 12-31-1950   Cancelled Treatment:    Reason Eval/Treat Not Completed: Patient at procedure or test/ unavailable- will follow up as appropriate/able.   Barry Brunner, OT Acute Rehabilitation Services Office [phone]   Chancy Milroy 02/15/2023, 8:35 AM

## 2023-02-15 NOTE — Plan of Care (Signed)

## 2023-02-15 NOTE — H&P (Signed)
Patient tolerated yesterday's procedure well.  Patient presents today with very minimal pain.  Will proceed with today's procedure, as previously discussed, specifically, a posterior decompression and fusion spanning L3-L5, with instrumentation and allograft.

## 2023-02-15 NOTE — Evaluation (Signed)
Physical Therapy Evaluation Patient Details Name: Troy Marsh MRN: [id number] DOB: February 03, 1951 Today's Date: 02/15/2023  History of Present Illness  Pt is a 72 year old man admitted on 7/31 for a 2 step back sx. Underwent L direct lateral L3-4, L4-5 interbody fusion with insertion of interbody device on 7/31 and L3-5 posterior decompresssion and fusion on 8/1. PMH: HTN, arthritis, BPH, HLD, HIV.   Clinical Impression  Pt presents with condition above and deficits mentioned below, see PT Problem List. PTA, he was independent without DME. He lives with his husband in a 2-level house with 5 STE. His husband uses a RW and can only assist minimally but pt has hired assistance for housekeeping and laundry. Currently, pt is primarily limited by pain but able to perform all functional mobility without LOB or physical assistance. He does utilize x1 handrail on the stairs and does tend to intermittently reach out to touch the wall for support with one hand when ambulating. Pt will likely progress well and quickly as his pain improves. Thus, no follow-up PT likely is needed at d/c. Will continue to follow acutely to maximize his return to baseline prior to d/c.    If plan is discharge home, recommend the following: Assistance with cooking/housework;Assist for transportation;Help with stairs or ramp for entrance   Can travel by private vehicle        Equipment Recommendations None recommended by PT  Recommendations for Other Services       Functional Status Assessment Patient has had a recent decline in their functional status and demonstrates the ability to make significant improvements in function in a reasonable and predictable amount of time.     Precautions / Restrictions Precautions Precautions: Back;Fall Precaution Booklet Issued: Yes (comment) Precaution Comments: reviewed precautions Required Braces or Orthoses: Spinal Brace Spinal Brace: Thoracolumbosacral orthotic;Applied in sitting  position Restrictions Weight Bearing Restrictions: No      Mobility  Bed Mobility Overal bed mobility: Needs Assistance Bed Mobility: Rolling, Sidelying to Sit Rolling: Supervision Sidelying to sit: Supervision, HOB elevated       General bed mobility comments: Supervision for safety rolling and sitting up L EOB with HOB slightly elevated.    Transfers Overall transfer level: Needs assistance Equipment used: None Transfers: Sit to/from Stand Sit to Stand: Min guard           General transfer comment: slow to rise, good technique, no LOB, min guard for safety    Ambulation/Gait Ambulation/Gait assistance: Min guard Gait Distance (Feet): 300 Feet Assistive device: None (intermittent x1 UE support on wall) Gait Pattern/deviations: Step-through pattern, Decreased stride length Gait velocity: reduced Gait velocity interpretation: 1.31 - 2.62 ft/sec, indicative of limited community ambulator   General Gait Details: Pt takes slow, small, cautious steps, intermittently reaching out for light UE support on the wall. No LOB, min guard for safety  Stairs Stairs: Yes Stairs assistance: Min guard Stair Management: One rail Right, One rail Left, Step to pattern, Forwards Number of Stairs: 4 General stair comments: Ascends with R rail, leading up with his L leg. Descends with L rail, leading down with his R leg. No LOB, min guard for safety  Wheelchair Mobility     Tilt Bed    Modified Rankin (Stroke Patients Only)       Balance Overall balance assessment: Mild deficits observed, not formally tested   Sitting balance-Leahy Scale: Good       Standing balance-Leahy Scale: Fair  Pertinent Vitals/Pain Pain Assessment Pain Assessment: Faces Faces Pain Scale: Hurts a little bit Pain Location: incision Pain Descriptors / Indicators: Discomfort, Operative site guarding Pain Intervention(s): Limited activity within patient's  tolerance, Monitored during session, Repositioned    Home Living Family/patient expects to be discharged to:: Private residence Living Arrangements: Spouse/significant other Available Help at Discharge: Family;Available 24 hours/day Type of Home: Other(Comment) (townhouse) Home Access: Stairs to enter Entrance Stairs-Rails: Right;Left Entrance Stairs-Number of Steps: 5 Alternate Level Stairs-Number of Steps: flight with chair lift Home Layout: Two level;1/2 bath on main level Home Equipment: Rolling Walker (2 wheels);Shower seat;Grab bars - toilet;Grab bars - tub/shower;Hand held shower head;Cane - single point      Prior Function Prior Level of Function : Independent/Modified Independent             Mobility Comments: No AD ADLs Comments: had hired assist for laundry and housekeeping, spouse uses a Sport and exercise psychologist Dominance   Dominant Hand: Right    Extremity/Trunk Assessment   Upper Extremity Assessment Upper Extremity Assessment: Defer to OT evaluation    Lower Extremity Assessment Lower Extremity Assessment: Generalized weakness (noted with functional mobility)    Cervical / Trunk Assessment Cervical / Trunk Assessment: Back Surgery  Communication   Communication: No difficulties  Cognition Arousal/Alertness: Awake/alert Behavior During Therapy: WFL for tasks assessed/performed Overall Cognitive Status: Within Functional Limits for tasks assessed                                          General Comments      Exercises     Assessment/Plan    PT Assessment Patient needs continued PT services  PT Problem List Decreased strength;Decreased activity tolerance;Decreased balance;Decreased mobility       PT Treatment Interventions DME instruction;Gait training;Stair training;Functional mobility training;Therapeutic activities;Balance training;Therapeutic exercise;Neuromuscular re-education;Patient/family education    PT Goals (Current goals  can be found in the Care Plan section)  Acute Rehab PT Goals Patient Stated Goal: to improve PT Goal Formulation: With patient Time For Goal Achievement: 03/01/23 Potential to Achieve Goals: Good    Frequency Min 1X/week     Co-evaluation               AM-PAC PT "6 Clicks" Mobility  Outcome Measure Help needed turning from your back to your side while in a flat bed without using bedrails?: A Little Help needed moving from lying on your back to sitting on the side of a flat bed without using bedrails?: A Little Help needed moving to and from a bed to a chair (including a wheelchair)?: A Little Help needed standing up from a chair using your arms (e.g., wheelchair or bedside chair)?: A Little Help needed to walk in hospital room?: A Little Help needed climbing 3-5 steps with a railing? : A Little 6 Click Score: 18    End of Session Equipment Utilized During Treatment: Back brace Activity Tolerance: Patient tolerated treatment well Patient left: in chair;with call bell/phone within reach   PT Visit Diagnosis: Unsteadiness on feet (R26.81);Other abnormalities of gait and mobility (R26.89);Muscle weakness (generalized) (M62.81);Difficulty in walking, not elsewhere classified (R26.2)    Time: 1341-1400 PT Time Calculation (min) (ACUTE ONLY): 19 min   Charges:   PT Evaluation $PT Eval Moderate Complexity: 1 Mod   PT General Charges $$ ACUTE PT VISIT: 1 Visit  Raymond Gurney, PT, DPT Acute Rehabilitation Services  Office: [phone]   Jewel Baize 02/15/2023, 2:59 PM

## 2023-02-15 NOTE — Evaluation (Signed)
Occupational Therapy Evaluation and Discharge Patient Details Name: Troy Marsh MRN: [id number] DOB: 06-04-1951 Today's Date: 02/15/2023   History of Present Illness Pt is a 72 year old man admitted on 7/31 for a 2 step back sx. Underwent L direct lateral L3-4, L4-5 interbody fusion with insertion of interbody device and on 7/31 and L3-5 posterior decompresssion and fusion on 8/1. PMH: HTN, arthritis, BPH, HLD, HIV.   Clinical Impression   Pt is typically independent in self care and mobility. He lives with his husband who can provide limited physical assist, but he has hired assistance for housekeeping and laundry. Pt presents with minimal pain. He ambulates with min guard assist for safety. He is unable to access his feet for LB bathing and dressing, but owns and is knowledgeable in use of sock aid, reacher and long handled bath sponge. Pt educated in compensatory strategies for ADLs adhering to back precautions. Verbalized understanding. No further OT needs.      Recommendations for follow up therapy are one component of a multi-disciplinary discharge planning process, led by the attending physician.  Recommendations may be updated based on patient status, additional functional criteria and insurance authorization.   Assistance Recommended at Discharge Intermittent Supervision/Assistance  Patient can return home with the following Assist for transportation;Assistance with cooking/housework    Functional Status Assessment  Patient has had a recent decline in their functional status and demonstrates the ability to make significant improvements in function in a reasonable and predictable amount of time.  Equipment Recommendations  None recommended by OT    Recommendations for Other Services       Precautions / Restrictions Precautions Precautions: Back;Fall Precaution Booklet Issued: Yes (comment) Required Braces or Orthoses: Spinal Brace Spinal Brace: Thoracolumbosacral  orthotic;Applied in sitting position Restrictions Weight Bearing Restrictions: No      Mobility Bed Mobility Overal bed mobility: Needs Assistance Bed Mobility: Rolling, Sidelying to Sit, Sit to Sidelying Rolling: Supervision Sidelying to sit: Supervision     Sit to sidelying: Min assist General bed mobility comments: light min assist for LEs back into bed    Transfers Overall transfer level: Needs assistance Equipment used: None Transfers: Sit to/from Stand Sit to Stand: Min guard           General transfer comment: slow to rise, good technique      Balance Overall balance assessment: Needs assistance   Sitting balance-Leahy Scale: Good       Standing balance-Leahy Scale: Fair                             ADL either performed or assessed with clinical judgement   ADL Overall ADL's : Needs assistance/impaired Eating/Feeding: Independent;Bed level   Grooming: Oral care;Wash/dry hands;Standing;Supervision/safety Grooming Details (indicate cue type and reason): educated in 2 cup method for oral care Upper Body Bathing: Set up;Sitting Upper Body Bathing Details (indicate cue type and reason): instructed to use long bath sponge for back, avoiding incision Lower Body Bathing: Minimal assistance;Sit to/from stand Lower Body Bathing Details (indicate cue type and reason): instructed to use AE Upper Body Dressing : Set up;Sitting Upper Body Dressing Details (indicate cue type and reason): min assist for back brace Lower Body Dressing: Moderate assistance;Sitting/lateral leans Lower Body Dressing Details (indicate cue type and reason): instructed to use his sock aide and reacher, slip on shoes Toilet Transfer: Min guard;Ambulation   Toileting- Clothing Manipulation and Hygiene: Supervision/safety  Functional mobility during ADLs: Min guard       Vision Ability to See in Adequate Light: 0 Adequate Patient Visual Report: No change from baseline        Perception     Praxis      Pertinent Vitals/Pain Pain Assessment Pain Assessment: Faces Faces Pain Scale: Hurts a little bit Pain Location: incision Pain Descriptors / Indicators: Discomfort Pain Intervention(s): Monitored during session     Hand Dominance Right   Extremity/Trunk Assessment Upper Extremity Assessment Upper Extremity Assessment: Overall WFL for tasks assessed   Lower Extremity Assessment Lower Extremity Assessment: Defer to PT evaluation   Cervical / Trunk Assessment Cervical / Trunk Assessment: Back Surgery   Communication Communication Communication: No difficulties   Cognition Arousal/Alertness: Awake/alert Behavior During Therapy: WFL for tasks assessed/performed Overall Cognitive Status: Within Functional Limits for tasks assessed                                       General Comments       Exercises     Shoulder Instructions      Home Living Family/patient expects to be discharged to:: Private residence Living Arrangements: Spouse/significant other Available Help at Discharge: Family;Available 24 hours/day Type of Home: Other(Comment) (townhouse) Home Access: Stairs to enter [employer] of Steps: 5 Entrance Stairs-Rails: Right;Left Home Layout: Two level;1/2 bath on main level Alternate Level Stairs-Number of Steps: flight with chair lift   Bathroom Shower/Tub: Arts development officer Toilet: Handicapped height     Home Equipment: Agricultural consultant (2 wheels);Shower seat;Grab bars - toilet;Grab bars - tub/shower;Hand held shower head;Cane - single point          Prior Functioning/Environment Prior Level of Function : Independent/Modified Independent               ADLs Comments: had hired assist for [employer] and housekeeping, spouse uses a walker        OT Problem List:        OT Treatment/Interventions:      OT Goals(Current goals can be found in the care plan section)    OT  Frequency:      Co-evaluation              AM-PAC OT "6 Clicks" Daily Activity     Outcome Measure Help from another person eating meals?: None Help from another person taking care of personal grooming?: A Little Help from another person toileting, which includes using toliet, bedpan, or urinal?: A Little Help from another person bathing (including washing, rinsing, drying)?: A Little Help from another person to put on and taking off regular upper body clothing?: None Help from another person to put on and taking off regular lower body clothing?: A Lot 6 Click Score: 19   End of Session Equipment Utilized During Treatment: Gait belt;Back brace  Activity Tolerance: Patient tolerated treatment well Patient left: Other (comment) (with PT)  OT Visit Diagnosis: Other abnormalities of gait and mobility (R26.89)                Time: 5784-6962 OT Time Calculation (min): 25 min Charges:  OT General Charges $OT Visit: 1 Visit OT Evaluation $OT Eval Moderate Complexity: 1 Mod OT Treatments $Self Care/Home Management : 8-22 mins  Berna Spare, OTR/L Acute Rehabilitation Services Office: [phone]   Evern Bio 02/15/2023, 2:00 PM

## 2023-02-15 NOTE — Progress Notes (Signed)
PT Cancellation Note  Patient Details Name: Troy Marsh MRN: [id number] DOB: 1951/04/09   Cancelled Treatment:    Reason Eval/Treat Not Completed: (P) Patient at procedure or test/unavailable. Will plan to follow-up later as time permits.   Raymond Gurney, PT, DPT Acute Rehabilitation Services  Office: [phone]    Jewel Baize 02/15/2023, 8:10 AM

## 2023-02-15 NOTE — Transfer of Care (Signed)
Immediate Anesthesia Transfer of Care Note  Patient: Troy Marsh  Procedure(s) Performed: LUMBAR 3- LUMBAR 4, LUMBAR 4- LUMBAR 5 POSTERIOR DECOMPRESSION AND FUSION WITH INSTRUMENTATION AND ALLOGRAFT (Spine Lumbar)  Patient Location: PACU  Anesthesia Type:General  Level of Consciousness: drowsy, patient cooperative, and responds to stimulation  Airway & Oxygen Therapy: Patient Spontanous Breathing and Patient connected to face mask oxygen  Post-op Assessment: Report given to RN, Post -op Vital signs reviewed and stable, and Patient moving all extremities X 4  Post vital signs: Reviewed and stable  Last Vitals:  Vitals Value Taken Time  BP 127/68 02/15/23 0948  Temp    Pulse 66 02/15/23 0951  Resp 12 02/15/23 0951  SpO2 100 % 02/15/23 0951  Vitals shown include unfiled device data.  Last Pain:  Vitals:   02/15/23 0643  TempSrc: Oral  PainSc: 4       Patients Stated Pain Goal: 3 (02/14/23 2252)  Complications: No notable events documented.

## 2023-02-15 NOTE — Anesthesia Postprocedure Evaluation (Signed)
Anesthesia Post Note  Patient: Troy Marsh  Procedure(s) Performed: LUMBAR 3- LUMBAR 4, LUMBAR 4- LUMBAR 5 POSTERIOR DECOMPRESSION AND FUSION WITH INSTRUMENTATION AND ALLOGRAFT (Spine Lumbar)     Patient location during evaluation: PACU Anesthesia Type: General Level of consciousness: awake and alert Pain management: pain level controlled Vital Signs Assessment: post-procedure vital signs reviewed and stable Respiratory status: spontaneous breathing, nonlabored ventilation, respiratory function stable and patient connected to nasal cannula oxygen Cardiovascular status: blood pressure returned to baseline and stable Postop Assessment: no apparent nausea or vomiting Anesthetic complications: no   No notable events documented.  Last Vitals:  Vitals:   02/15/23 1030 02/15/23 1043  BP: (!) 143/67 (!) 143/66  Pulse: 65 68  Resp: 16 18  Temp: 36.6 C   SpO2: 99% 100%    Last Pain:  Vitals:   02/15/23 1245  TempSrc:   PainSc: 4                  Yandel Zeiner P Kayron Kalmar

## 2023-02-15 NOTE — Anesthesia Procedure Notes (Signed)
Procedure Name: Intubation Date/Time: 02/15/2023 7:46 AM  Performed by: Shary Decamp, CRNAPre-anesthesia Checklist: Patient identified, Patient being monitored, Timeout performed, Emergency Drugs available and Suction available Patient Re-evaluated:Patient Re-evaluated prior to induction Oxygen Delivery Method: Circle System Utilized Preoxygenation: Pre-oxygenation with 100% oxygen Induction Type: IV induction Ventilation: Mask ventilation without difficulty Laryngoscope Size: Miller and 3 Grade View: Grade I Tube type: Oral Tube size: 7.5 mm Number of attempts: 1 Airway Equipment and Method: Stylet Placement Confirmation: ETT inserted through vocal cords under direct vision, positive ETCO2 and breath sounds checked- equal and bilateral Secured at: 22 cm Tube secured with: Tape Dental Injury: Teeth and Oropharynx as per pre-operative assessment

## 2023-02-16 MED ORDER — TESTOSTERONE ENANTHATE 200 MG/ML IM SOLN: 200.0000 mg | INTRAMUSCULAR | 4 refills | Status: DC

## 2023-02-16 NOTE — Telephone Encounter (Signed)
Done. Thx.

## 2023-02-16 NOTE — Progress Notes (Signed)
Physical Therapy Treatment Patient Details Name: YEHYA BRENDLE MRN: [id number] DOB: 1950/08/26 Today's Date: 02/16/2023   History of Present Illness Pt is a 72 year old man admitted on 7/31 for a 2 step back sx. Underwent L direct lateral L3-4, L4-5 interbody fusion with insertion of interbody device on 7/31 and L3-5 posterior decompresssion and fusion on 8/1. PMH: HTN, arthritis, BPH, HLD, HIV.    PT Comments  Pt pleasant and reporting minimal back pain. Pt with good knowledge and application of back precautions, is progressing well with ambulation tolerance, and proficiently navigates stairs demonstrating ability to navigate home. Pt met PT goals, and pt with no further questions. Okay to d/c from PT perspective.    If plan is discharge home, recommend the following: Assistance with cooking/housework;Assist for transportation;Help with stairs or ramp for entrance   Can travel by private vehicle        Equipment Recommendations  None recommended by PT    Recommendations for Other Services       Precautions / Restrictions Precautions Precautions: Back;Fall Precaution Booklet Issued: Yes (comment) Precaution Comments: reviewed precautions Required Braces or Orthoses: Spinal Brace Spinal Brace: Thoracolumbosacral orthotic;Applied in sitting position Restrictions Weight Bearing Restrictions: No     Mobility  Bed Mobility               General bed mobility comments: up in chair    Transfers Overall transfer level: Needs assistance Equipment used: None Transfers: Sit to/from Stand Sit to Stand: Supervision           General transfer comment: safety    Ambulation/Gait Ambulation/Gait assistance: Supervision Gait Distance (Feet): 350 Feet Assistive device: None Gait Pattern/deviations: Step-through pattern, Decreased stride length Gait velocity: decr     General Gait Details: slowed speed but steady, no LOB   Stairs Stairs: Yes Stairs assistance:  Supervision Stair Management: One rail Right, Alternating pattern, Forwards Number of Stairs: 20 General stair comments: supervision for safety, min cues for sequencing   Wheelchair Mobility     Tilt Bed    Modified Rankin (Stroke Patients Only)       Balance Overall balance assessment: Mild deficits observed, not formally tested   Sitting balance-Leahy Scale: Good       Standing balance-Leahy Scale: Fair                              Cognition Arousal/Alertness: Awake/alert Behavior During Therapy: WFL for tasks assessed/performed Overall Cognitive Status: Within Functional Limits for tasks assessed                                 General Comments: good recall of precautions, knows limitations        Exercises  Home walking program: up and walking 1x/hour during waking hours for short household distances with supervision of family, to promote circulation, activity tolerance, and strength maintenance.      General Comments        Pertinent Vitals/Pain Pain Assessment Pain Assessment: Faces Faces Pain Scale: Hurts a little bit Pain Location: back Pain Descriptors / Indicators: Sore, Tightness Pain Intervention(s): Limited activity within patient's tolerance, Monitored during session, Repositioned    Home Living                          Prior Function  PT Goals (current goals can now be found in the care plan section) Acute Rehab PT Goals Patient Stated Goal: to improve PT Goal Formulation: With patient Time For Goal Achievement: 03/01/23 Potential to Achieve Goals: Good Progress towards PT goals: Progressing toward goals    Frequency    Min 1X/week      PT Plan Current plan remains appropriate    Co-evaluation              AM-PAC PT "6 Clicks" Mobility   Outcome Measure  Help needed turning from your back to your side while in a flat bed without using bedrails?: None Help needed moving  from lying on your back to sitting on the side of a flat bed without using bedrails?: None Help needed moving to and from a bed to a chair (including a wheelchair)?: A Little Help needed standing up from a chair using your arms (e.g., wheelchair or bedside chair)?: A Little Help needed to walk in hospital room?: A Little Help needed climbing 3-5 steps with a railing? : A Little 6 Click Score: 20    End of Session Equipment Utilized During Treatment: Back brace Activity Tolerance: Patient tolerated treatment well Patient left: in chair;with call bell/phone within reach Nurse Communication: Mobility status PT Visit Diagnosis: Unsteadiness on feet (R26.81);Other abnormalities of gait and mobility (R26.89);Muscle weakness (generalized) (M62.81);Difficulty in walking, not elsewhere classified (R26.2)     Time: 4098-1191 PT Time Calculation (min) (ACUTE ONLY): 19 min  Charges:    $Therapeutic Activity: 8-22 mins PT General Charges $$ ACUTE PT VISIT: 1 Visit                     Marye Round, PT DPT Acute Rehabilitation Services Secure Chat Preferred  Office [phone]     E Stroup 02/16/2023, 9:09 AM

## 2023-02-16 NOTE — Progress Notes (Signed)
Occupational Therapy Treatment Patient Details Name: Troy Marsh MRN: [id number] DOB: [birth year] Today's Date: 02/16/2023   History of present illness Pt is a 72 year old man admitted on 7/31 for a 2 step back sx. Underwent L direct lateral L3-4, L4-5 interbody fusion with insertion of interbody device on 7/31 and L3-5 posterior decompresssion and fusion on 8/1. PMH: HTN, arthritis, BPH, HLD, HIV.   OT comments  Pt is generalizing back precautions in ADLs and mobility well. Demonstrated ability to don and doff back brace. Reinforced use of AE for LB bathing and dressing. Pt is ready to discharge home.    Recommendations for follow up therapy are one component of a multi-disciplinary discharge planning process, led by the attending physician.  Recommendations may be updated based on patient status, additional functional criteria and insurance authorization.    Assistance Recommended at Discharge Intermittent Supervision/Assistance  Patient can return home with the following  Assist for transportation;Assistance with cooking/housework   Equipment Recommendations  None recommended by OT    Recommendations for Other Services      Precautions / Restrictions Precautions Precautions: Back;Fall Precaution Booklet Issued: Yes (comment) Precaution Comments: pt able to state and generalize back precautions Required Braces or Orthoses: Spinal Brace Spinal Brace: Thoracolumbosacral orthotic;Applied in sitting position Restrictions Weight Bearing Restrictions: No       Mobility Bed Mobility               General bed mobility comments: up in chair    Transfers Overall transfer level: Modified independent Equipment used: None                     Balance Overall balance assessment: Mild deficits observed, not formally tested                                         ADL either performed or assessed with clinical judgement   ADL Overall ADL's : Needs  assistance/impaired                 Upper Body Dressing : Modified independent;Sitting;Standing   Lower Body Dressing: Minimal assistance;Sit to/from stand   Toilet Transfer: Modified Stage manager and Hygiene: Modified independent       Functional mobility during ADLs: Modified independent      Extremity/Trunk Assessment              Vision       Perception     Praxis      Cognition Arousal/Alertness: Awake/alert Behavior During Therapy: WFL for tasks assessed/performed Overall Cognitive Status: Within Functional Limits for tasks assessed                                          Exercises      Shoulder Instructions       General Comments      Pertinent Vitals/ Pain       Pain Assessment Pain Assessment: Faces Faces Pain Scale: Hurts a little bit Pain Location: back Pain Intervention(s): Monitored during session, Repositioned  Home Living  Prior Functioning/Environment              Frequency           Progress Toward Goals  OT Goals(current goals can now be found in the care plan section)  Progress towards OT goals: Goals met and updated - see care plan     Plan All goals met and education completed, patient discharged from OT services    Co-evaluation                 AM-PAC OT "6 Clicks" Daily Activity     Outcome Measure   Help from another person eating meals?: None Help from another person taking care of personal grooming?: A Little Help from another person toileting, which includes using toliet, bedpan, or urinal?: A Little Help from another person bathing (including washing, rinsing, drying)?: A Little Help from another person to put on and taking off regular upper body clothing?: None Help from another person to put on and taking off regular lower body clothing?: A Little 6 Click Score: 20    End of  Session Equipment Utilized During Treatment: Back brace  OT Visit Diagnosis: Other abnormalities of gait and mobility (R26.89)   Activity Tolerance Patient tolerated treatment well   Patient Left Other (comment) (walking in hall)   Nurse Communication          Time: 1610-9604 OT Time Calculation (min): 23 min  Charges: OT General Charges $OT Visit: 1 Visit OT Treatments $Self Care/Home Management : 23-37 mins  Berna Spare, OTR/L Acute Rehabilitation Services Office: [phone]  Evern Bio 02/16/2023, 9:48 AM

## 2023-02-16 NOTE — Progress Notes (Signed)
    Patient doing well  Pain is minimal  Physical Exam: Vitals:   02/16/23 0417 02/16/23 0713  BP: (!) 154/77 (!) 152/79  Pulse: 68 61  Resp: 18 19  Temp: 98 F (36.7 C) 97.8 F (36.6 C)  SpO2: 97% 100%    Dressing in place NVI  Pt s/p lateral/posterior fusion and decompression, doing well  - up with PT/OT, encourage ambulation - Percocet for pain, Robaxin for muscle spasms - d/c home today with f/u in 2 weeks

## 2023-02-16 NOTE — Progress Notes (Signed)
Patient alert and oriented, ambulate, void, surgical site clean and dry. D/c instructions explain and given all questions answered.

## 2023-02-16 NOTE — Telephone Encounter (Signed)
MD sent rx to [facility] speciality pharmacy.Marland KitchenRaechel Chute

## 2023-02-19 ENCOUNTER — Telehealth: Payer: Self-pay

## 2023-02-19 NOTE — Transitions of Care (Post Inpatient/ED Visit) (Signed)
02/19/2023  Name: Troy Marsh MRN: [id number] DOB: [birth year]  Today's TOC FU Call Status: Today's TOC FU Call Status:: Successful TOC FU Call Completed TOC FU Call Complete Date: 02/19/23  Transition Care Management Follow-up Telephone Call Date of Discharge: 02/16/23 Discharge Facility: [facility]) Type of Discharge: Inpatient Admission Primary Inpatient Discharge Diagnosis:: radiculopathy How have you been since you were released from the hospital?: Better Any questions or concerns?: No  Items Reviewed: Did you receive and understand the discharge instructions provided?: Yes Medications obtained,verified, and reconciled?: Yes (Medications Reviewed) Any new allergies since your discharge?: No Dietary orders reviewed?: Yes Do you have support at home?: Yes People in Home: spouse, friend(s)  Medications Reviewed Today: Medications Reviewed Today     Reviewed by Karena Addison, LPN (Licensed Practical Nurse) on 02/19/23 at 1539  Med List Status: <None>   Medication Order Taking? Sig Documenting Provider Last Dose Status Informant  alprostadil (CAVERJECT IMPULSE) 20 MCG injection [id number] No INJECT 20 MCG BY INTRACAVITARY  ROUTE AS DIRECTED AS NEEDED FOR  ERECTILE DYSFUNCTION. USE NO  MORE THAN 3 TIMES WEEKLY Plotnikov, Georgina Quint, MD More than a month Active Self  aspirin 81 MG tablet [id number] No Take 81 mg by mouth daily. [provider] 02/03/2023 Active Self  atorvastatin (LIPITOR) 40 MG tablet [id number] No Take 40 mg by mouth at bedtime.  [provider] Taking Expired 01/31/23 2359 Self           Med Note (DAVIS, SOPHIA A   Wed May 23, 2017  3:00 PM)    B-D BLUNT FILL NEEDLE 18G X 1-1/2" MISC [id number] No USE AS INSTRUCTED Plotnikov, Georgina Quint, MD Taking Active Self  baclofen (LIORESAL) 20 MG tablet [id number] No Take 1 tablet (20 mg total) by mouth 3 (three) times daily.  Patient taking differently: Take 20 mg by mouth 3 (three) times daily as  needed for muscle spasms.   Plotnikov, Georgina Quint, MD Past Month Active Self           Med Note Joella Prince A   Wed Jan 31, 2023  4:07 PM) Pt states he'll take baclofen if he doesn't take methocarbamol   BD DISP NEEDLES 21G X 1-1/2" MISC [id number] No USE AS DIRECTED Plotnikov, Georgina Quint, MD Taking Active Self  calcium carbonate (TUMS - DOSED IN MG ELEMENTAL CALCIUM) 500 MG chewable tablet [id number] No Chew 500 mg by mouth daily as needed for indigestion or heartburn. [provider] Past Week Active Self  Carboxymethylcellulose Sodium (THERATEARS OP) [id number] No Place 1 drop into both eyes 2 (two) times daily. [provider] 02/14/2023 Active Self  cetirizine-pseudoephedrine (ZYRTEC-D) 5-120 MG tablet [id number] No Take 1 tablet by mouth daily as needed for allergies.  [provider] 02/13/2023 Active Self  cyclobenzaprine (FLEXERIL) 5 MG tablet [id number] No Take 5-10 mg by mouth at bedtime as needed for muscle spasms. [provider] 02/03/2023 Active Self           Med Note Sherrie Mustache, Ronaldo Miyamoto A   Wed Jan 31, 2023  4:08 PM) Pt will only take this at night if he hasn't taken a methocarbamol in the evening  diltiazem 2 % GEL [id number] No Diltiazem gel 2% apply rectally twice daily for 4 weeks  Patient taking differently: Apply 1 Application topically at bedtime.   Lynann Bologna, MD Past Month Active Self  emtricitabine-rilpivir-tenofovir AF (ODEFSEY) 200-25-25 MG TABS tablet [id number] No Take 1 tablet by mouth daily. [provider] 02/14/2023  Active Self           Med Note (DAVIS, SOPHIA A   Wed May 23, 2017  3:00 PM)    gabapentin (NEURONTIN) 300 MG capsule [id number] No Take 300 mg by mouth 3 (three) times daily. [provider] 02/14/2023 Active Self  hydrochlorothiazide (HYDRODIURIL) 50 MG tablet [id number] No Take 1 tablet (50 mg total) by mouth daily. Plotnikov, Georgina Quint, MD 02/14/2023 Active Self  HYDROcodone-acetaminophen (NORCO) 7.5-325 MG  tablet [id number] No Take 1 tablet by mouth every 6 (six) hours as needed for severe pain. Plotnikov, Georgina Quint, MD 02/13/2023 Active Self  hydrocortisone (ANUSOL-HC) 25 MG suppository [id number] No Place 1 suppository (25 mg total) rectally 2 (two) times daily. Use for 10 days after a BM and then use as needed  Patient taking differently: Place 25 mg rectally at bedtime.   Lynann Bologna, MD 02/12/2023 Active Self  linaclotide Karlene Einstein) 290 MCG CAPS capsule [id number] No Take 1 capsule (290 mcg total) by mouth daily before breakfast. Lynann Bologna, MD 02/13/2023 Active Self  Melatonin 10 MG TABS [id number] No Take 10 mg by mouth at bedtime. [provider] 02/13/2023 Active Self  methocarbamol (ROBAXIN) 500 MG tablet [id number] No Take 1 tablet by mouth 4 (four) times daily as needed for muscle spasms. [provider] 02/13/2023 Active Self           Med Note Sherrie Mustache, Ronaldo Miyamoto A   Wed Jan 31, 2023  4:07 PM) Pt states he'll take baclofen if he doesn't take methocarbamol   methocarbamol (ROBAXIN) 500 MG tablet [id number]  Take 1 tablet (500 mg total) by mouth every 6 (six) hours as needed for muscle spasms. Estill Bamberg, MD  Active   montelukast (SINGULAIR) 10 MG tablet [id number] No Take 1 tablet (10 mg total) by mouth daily. Plotnikov, Georgina Quint, MD 02/13/2023 Active Self  Multiple Vitamin (MULTIVITAMIN WITH MINERALS) TABS tablet [id number] No Take 1 tablet by mouth daily. COMPLETE MULTIVITAMIN MEN'S 50+ [provider] Past Week Active Self  oxyCODONE-acetaminophen (PERCOCET/ROXICET) 5-325 MG tablet [id number]  Take 1-2 tablets by mouth every 4 (four) hours as needed for severe pain. Estill Bamberg, MD  Active   polyethylene glycol powder [facility]) powder [id number] No Take 17 g by mouth at bedtime. [provider] 02/13/2023 Active Self  potassium chloride (KLOR-CON M) 10 MEQ tablet [id number] No TAKE 1 TABLET BY MOUTH  DAILY Plotnikov, Georgina Quint, MD 02/13/2023 Active Self   RESTASIS 0.05 % ophthalmic emulsion [id number] No Place 1 drop into both eyes 2 (two) times daily as needed (dry eyes). [provider] 02/14/2023 Active Self           Med Note (DAVIS, SOPHIA A   Wed May 23, 2017  2:56 PM)    Suvorexant (BELSOMRA) 10 MG TABS [id number] No TAKE 1 TABLET BY MOUTH AT  BEDTIME AS NEEDED  Patient taking differently: Take 1 tablet by mouth at bedtime.   Plotnikov, Georgina Quint, MD 02/13/2023 Active Self  tadalafil (CIALIS) 5 MG tablet [id number] No TAKE 1 TABLET EVERY DAY Plotnikov, Georgina Quint, MD 02/13/2023 Active Self  testosterone enanthate (DELATESTRYL) 200 MG/ML injection [id number]  Inject 1 mL (200 mg total) into the muscle every 14 (fourteen) days. Plotnikov, Georgina Quint, MD  Active             Home Care and Equipment/Supplies: Were Home Health Services Ordered?: NA Any new equipment or medical supplies ordered?: NA  Functional Questionnaire: Do you need assistance with bathing/showering or dressing?:  No Do you need assistance with meal preparation?: No Do you need assistance with eating?: No Do you have difficulty maintaining continence: No Do you need assistance with getting out of bed/getting out of a chair/moving?: No Do you have difficulty managing or taking your medications?: No  Follow up appointments reviewed: PCP Follow-up appointment confirmed?: NA Specialist Hospital Follow-up appointment confirmed?: Yes Date of Specialist follow-up appointment?: 02/28/23 Follow-Up Specialty Provider:: ortho Do you need transportation to your follow-up appointment?: No Do you understand care options if your condition(s) worsen?: Yes-patient verbalized understanding    SIGNATURE Karena Addison, LPN [facility] Nurse Health Advisor Direct Dial [phone]

## 2023-03-21 DIAGNOSIS — Z6820 Body mass index (BMI) 20.0-20.9, adult: Secondary | ICD-10-CM | POA: Insufficient documentation

## 2023-03-21 NOTE — Discharge Summary (Signed)
Patient ID: MOSIAH REXROTH MRN: [id number] DOB/AGE: 02/11/1951 72 y.o.  Admit date: 02/14/2023 Discharge date: 02/16/2023  Admission Diagnoses:  Principal Problem:   Radiculopathy, lumbar region   Discharge Diagnoses:  Same  Past Medical History:  Diagnosis Date   Arthritis    BPH (benign prostatic hyperplasia)    [facility]  DR. Kimberlee Nearing   Cancer [facility])    Skin cancer- basal and squamous   Closed fracture of nasal bones 06/02/2022   Constipation due to pain medication    HIV disease (HCC) 1985   last CD4 10/02/12 was 277   Hyperlipidemia    Hypertension    Hypogonadism male     Surgeries: Procedure(s): LUMBAR 3- LUMBAR 4, LUMBAR 4- LUMBAR 5 POSTERIOR DECOMPRESSION AND FUSION WITH INSTRUMENTATION AND ALLOGRAFT on 02/15/2023   Consultants: None  Discharged Condition: Improved  Hospital Course: ZAIN LAWRY is an 73 y.o. male who was admitted 02/14/2023 for operative treatment of Radiculopathy, lumbar region. Patient has severe unremitting pain that affects sleep, daily activities, and work/hobbies. After pre-op clearance the patient was taken to the operating room on 02/15/2023 and underwent  Procedure(s): LUMBAR 3- LUMBAR 4, LUMBAR 4- LUMBAR 5 POSTERIOR DECOMPRESSION AND FUSION WITH INSTRUMENTATION AND ALLOGRAFT.    Patient was given perioperative antibiotics:  Anti-infectives (From admission, onward)    Start     Dose/Rate Route Frequency Ordered Stop   02/15/23 1000  emtricitabine-rilpivir-tenofovir AF (ODEFSEY) 200-25-25 MG per tablet 1 tablet  Status:  Discontinued        1 tablet Oral Daily 02/14/23 1309 02/16/23 1603   02/15/23 0723  ceFAZolin (ANCEF) 2-4 GM/100ML-% IVPB       Note to Pharmacy: Cyndra Numbers (: cabinet override      02/15/23 0723 02/15/23 1929   02/15/23 0626  ceFAZolin (ANCEF) 2-4 GM/100ML-% IVPB       Note to Pharmacy: Gleason, Ginger E: cabinet override      02/15/23 0626 02/15/23 1829   02/14/23 1500  ceFAZolin (ANCEF)  IVPB 2g/100 mL premix        2 g 200 mL/hr over 30 Minutes Intravenous Every 8 hours 02/14/23 1250 02/14/23 2329   02/14/23 0615  ceFAZolin (ANCEF) IVPB 2g/100 mL premix  Status:  Discontinued        2 g 200 mL/hr over 30 Minutes Intravenous On call to O.R. 02/14/23 0600 02/14/23 0602   02/14/23 0615  ceFAZolin (ANCEF) IVPB 2g/100 mL premix  Status:  Discontinued        2 g 200 mL/hr over 30 Minutes Intravenous On call to O.R. 02/14/23 0600 02/14/23 1308   02/14/23 0600  vancomycin (VANCOCIN) IVPB 1000 mg/200 mL premix        1,000 mg 200 mL/hr over 60 Minutes Intravenous On call to O.R. 02/14/23 5621 02/14/23 3086        Patient was given sequential compression devices, early ambulation to prevent DVT.  Patient benefited maximally from hospital stay and there were no complications.    Recent vital signs: BP (!) 152/79 (BP Location: Right Arm)   Pulse 61   Temp 97.8 F (36.6 C) (Oral)   Resp 19   Ht 5\' 10"  (1.778 m)   Wt 59 kg   SpO2 100%   BMI 18.65 kg/m    Discharge Medications:   Allergies as of 02/16/2023   No Known Allergies      Medication List     TAKE these medications    aspirin 81  MG tablet Take 81 mg by mouth daily.   atorvastatin 40 MG tablet Commonly known as: LIPITOR Take 40 mg by mouth at bedtime.   B-D BLUNT FILL NEEDLE 18G X 1-1/2" Misc Generic drug: NEEDLE (DISP) 18 G USE AS INSTRUCTED   baclofen 20 MG tablet Commonly known as: LIORESAL Take 1 tablet (20 mg total) by mouth 3 (three) times daily. What changed:  when to take this reasons to take this   BD Disp Needles 21G X 1-1/2" Misc Generic drug: NEEDLE (DISP) 21 G USE AS DIRECTED   Belsomra 10 MG Tabs Generic drug: Suvorexant TAKE 1 TABLET BY MOUTH AT  BEDTIME AS NEEDED What changed:  how much to take when to take this   calcium carbonate 500 MG chewable tablet Commonly known as: TUMS - dosed in mg elemental calcium Chew 500 mg by mouth daily as needed for indigestion or  heartburn.   Caverject Impulse 20 MCG injection Generic drug: alprostadil INJECT 20 MCG BY INTRACAVITARY  ROUTE AS DIRECTED AS NEEDED FOR  ERECTILE DYSFUNCTION. USE NO  MORE THAN 3 TIMES WEEKLY   cetirizine-pseudoephedrine 5-120 MG tablet Commonly known as: ZYRTEC-D Take 1 tablet by mouth daily as needed for allergies.   cyclobenzaprine 5 MG tablet Commonly known as: FLEXERIL Take 5-10 mg by mouth at bedtime as needed for muscle spasms.   diltiazem 2 % Gel Diltiazem gel 2% apply rectally twice daily for 4 weeks What changed:  how much to take how to take this when to take this additional instructions   emtricitabine-rilpivir-tenofovir AF 200-25-25 MG Tabs tablet Commonly known as: ODEFSEY Take 1 tablet by mouth daily.   gabapentin 300 MG capsule Commonly known as: NEURONTIN Take 300 mg by mouth 3 (three) times daily.   hydrochlorothiazide 50 MG tablet Commonly known as: HYDRODIURIL Take 1 tablet (50 mg total) by mouth daily.   HYDROcodone-acetaminophen 7.5-325 MG tablet Commonly known as: NORCO Take 1 tablet by mouth every 6 (six) hours as needed for severe pain.   hydrocortisone 25 MG suppository Commonly known as: ANUSOL-HC Place 1 suppository (25 mg total) rectally 2 (two) times daily. Use for 10 days after a BM and then use as needed What changed:  when to take this additional instructions   linaclotide 290 MCG Caps capsule Commonly known as: LINZESS Take 1 capsule (290 mcg total) by mouth daily before breakfast.   Melatonin 10 MG Tabs Take 10 mg by mouth at bedtime.   methocarbamol 500 MG tablet Commonly known as: ROBAXIN Take 1 tablet by mouth 4 (four) times daily as needed for muscle spasms. What changed: Another medication with the same name was added. Make sure you understand how and when to take each.   methocarbamol 500 MG tablet Commonly known as: ROBAXIN Take 1 tablet (500 mg total) by mouth every 6 (six) hours as needed for muscle spasms. What  changed: You were already taking a medication with the same name, and this prescription was added. Make sure you understand how and when to take each.   montelukast 10 MG tablet Commonly known as: SINGULAIR Take 1 tablet (10 mg total) by mouth daily.   multivitamin with minerals Tabs tablet Take 1 tablet by mouth daily. COMPLETE MULTIVITAMIN MEN'S 50+   oxyCODONE-acetaminophen 5-325 MG tablet Commonly known as: PERCOCET/ROXICET Take 1-2 tablets by mouth every 4 (four) hours as needed for severe pain.   polyethylene glycol powder 17 GM/SCOOP powder Commonly known as: GLYCOLAX/MIRALAX Take 17 g by mouth at bedtime.  potassium chloride 10 MEQ tablet Commonly known as: KLOR-CON M TAKE 1 TABLET BY MOUTH  DAILY   Restasis 0.05 % ophthalmic emulsion Generic drug: cycloSPORINE Place 1 drop into both eyes 2 (two) times daily as needed (dry eyes).   tadalafil 5 MG tablet Commonly known as: Cialis TAKE 1 TABLET EVERY DAY   testosterone enanthate 200 MG/ML injection Commonly known as: DELATESTRYL Inject 1 mL (200 mg total) into the muscle every 14 (fourteen) days.   THERATEARS OP Place 1 drop into both eyes 2 (two) times daily.        Diagnostic Studies: No results found.  Disposition: Discharge disposition: 01-Home or Self Care        Pt s/p lateral/posterior fusion and decompression, doing well   - up with PT/OT, encourage ambulation - Percocet for pain, Robaxin for muscle spasms -Scripts for pain sent to pharmacy electronically  -D/C instructions sheet printed and in chart -D/C today  -F/U in office 2 weeks   Signed: Eilene Ghazi Milad Bublitz 03/21/2023, 12:02 PM

## 2023-04-14 ENCOUNTER — Encounter: Payer: Self-pay | Admitting: Internal Medicine

## 2023-04-14 ENCOUNTER — Other Ambulatory Visit: Payer: Self-pay | Admitting: Internal Medicine

## 2023-04-27 ENCOUNTER — Other Ambulatory Visit: Payer: Self-pay | Admitting: Internal Medicine

## 2023-04-27 ENCOUNTER — Other Ambulatory Visit: Payer: Self-pay | Admitting: Gastroenterology

## 2023-04-28 ENCOUNTER — Other Ambulatory Visit: Payer: Self-pay | Admitting: Internal Medicine

## 2023-04-30 ENCOUNTER — Encounter: Payer: Self-pay | Admitting: Internal Medicine

## 2023-05-01 ENCOUNTER — Telehealth: Payer: Self-pay | Admitting: Internal Medicine

## 2023-05-01 ENCOUNTER — Other Ambulatory Visit: Payer: Self-pay | Admitting: Internal Medicine

## 2023-05-01 NOTE — Telephone Encounter (Signed)
Prescription Request  05/01/2023  LOV: 11/20/2022  What is the name of the medication or equipment?  HYDROcodone-acetaminophen (NORCO) 7.5-325 MG tablet [   Have you contacted your pharmacy to request a refill? No   Which pharmacy would you like this sent to?     [facility] [place], [place] - [address] [place] [address] Phone: [phone] Fax: [phone]  Patient notified that their request is being sent to the clinical staff for review and that they should receive a response within 2 business days.   Please advise at Mobile [phone] (mobile)

## 2023-05-02 MED ORDER — HYDROCODONE-ACETAMINOPHEN 7.5-325 MG PO TABS: 1.0000 | ORAL_TABLET | Freq: Four times a day (QID) | ORAL | 0 refills | Status: DC | PRN

## 2023-05-02 NOTE — Telephone Encounter (Signed)
Okay.  Thanks.

## 2023-05-04 ENCOUNTER — Encounter: Payer: Self-pay | Admitting: Internal Medicine

## 2023-05-07 ENCOUNTER — Encounter: Payer: Self-pay | Admitting: Internal Medicine

## 2023-05-09 ENCOUNTER — Other Ambulatory Visit: Payer: Self-pay | Admitting: Internal Medicine

## 2023-05-09 MED ORDER — ALPROSTADIL (VASODILATOR) 250 MCG UR PLLT: 250.0000 ug | PELLET | URETHRAL | 5 refills | Status: DC | PRN

## 2023-05-09 NOTE — Progress Notes (Signed)
Caverject is not available.  Muse prescription was sent in

## 2023-05-15 ENCOUNTER — Encounter: Payer: Self-pay | Admitting: Internal Medicine

## 2023-05-15 ENCOUNTER — Ambulatory Visit (INDEPENDENT_AMBULATORY_CARE_PROVIDER_SITE_OTHER): Payer: Medicare Other | Admitting: Internal Medicine

## 2023-05-15 VITALS — BP 130/80 | HR 40 | Temp 98.0°F | Ht 70.0 in | Wt 133.0 lb

## 2023-05-15 DIAGNOSIS — D751 Secondary polycythemia: Secondary | ICD-10-CM | POA: Diagnosis not present

## 2023-05-15 DIAGNOSIS — I10 Essential (primary) hypertension: Secondary | ICD-10-CM

## 2023-05-15 DIAGNOSIS — E291 Testicular hypofunction: Secondary | ICD-10-CM

## 2023-05-15 DIAGNOSIS — M4807 Spinal stenosis, lumbosacral region: Secondary | ICD-10-CM | POA: Diagnosis not present

## 2023-05-15 DIAGNOSIS — N528 Other male erectile dysfunction: Secondary | ICD-10-CM

## 2023-05-15 LAB — CBC WITH DIFFERENTIAL/PLATELET
Basophils Absolute: 0 10*3/uL (ref 0.0–0.1)
Basophils Relative: 0 % (ref 0.0–3.0)
Eosinophils Absolute: 0 10*3/uL (ref 0.0–0.7)
Eosinophils Relative: 0 % (ref 0.0–5.0)
HCT: 49.2 % (ref 39.0–52.0)
Hemoglobin: 16.3 g/dL (ref 13.0–17.0)
Lymphocytes Relative: 5.9 % — ABNORMAL LOW (ref 12.0–46.0)
Lymphs Abs: 0.5 10*3/uL — ABNORMAL LOW (ref 0.7–4.0)
MCHC: 33.2 g/dL (ref 30.0–36.0)
MCV: 94 fL (ref 78.0–100.0)
Monocytes Absolute: 0.5 10*3/uL (ref 0.1–1.0)
Monocytes Relative: 5.9 % (ref 3.0–12.0)
Neutro Abs: 8.2 10*3/uL — ABNORMAL HIGH (ref 1.4–7.7)
Neutrophils Relative %: 88.2 % — ABNORMAL HIGH (ref 43.0–77.0)
Platelets: 205 10*3/uL (ref 150.0–400.0)
RBC: 5.24 Mil/uL (ref 4.22–5.81)
RDW: 13.6 % (ref 11.5–15.5)
WBC: 9.3 10*3/uL (ref 4.0–10.5)

## 2023-05-15 LAB — COMPREHENSIVE METABOLIC PANEL
ALT: 13 U/L (ref 0–53)
AST: 20 U/L (ref 0–37)
Albumin: 4.6 g/dL (ref 3.5–5.2)
Alkaline Phosphatase: 109 U/L (ref 39–117)
BUN: 26 mg/dL — ABNORMAL HIGH (ref 6–23)
CO2: 30 meq/L (ref 19–32)
Calcium: 10.9 mg/dL — ABNORMAL HIGH (ref 8.4–10.5)
Chloride: 94 meq/L — ABNORMAL LOW (ref 96–112)
Creatinine, Ser: 0.99 mg/dL (ref 0.40–1.50)
GFR: 75.69 mL/min (ref >60.00)
Glucose, Bld: 105 mg/dL — ABNORMAL HIGH (ref 70–99)
Potassium: 4 meq/L (ref 3.5–5.1)
Sodium: 132 meq/L — ABNORMAL LOW (ref 135–145)
Total Bilirubin: 0.7 mg/dL (ref 0.2–1.2)
Total Protein: 7.2 g/dL (ref 6.0–8.3)

## 2023-05-15 LAB — TESTOSTERONE: Testosterone: 158.71 ng/dL — ABNORMAL LOW (ref 300.00–890.00)

## 2023-05-15 NOTE — Assessment & Plan Note (Signed)
Caverjects prn n/a [facility] - n/a

## 2023-05-15 NOTE — Assessment & Plan Note (Signed)
S/p surgery - walking 5 mi/day

## 2023-05-15 NOTE — Progress Notes (Signed)
Subjective:  Patient ID: Troy Marsh, male    DOB: 19-Feb-1951  Age: 72 y.o. MRN: [id number]  CC: Medical Management of Chronic Issues (6 MNTH F/U)   HPI Troy Marsh presents for LBP, ED, hypogonadism f/u  Outpatient Medications Prior to Visit  Medication Sig Dispense Refill   Abaloparatide (TYMLOS [place]) Inject 80 mg into the skin daily. INJECT ONCE A DAY 80MG .     alprostadil (MUSE) 250 MCG pellet 1 each (250 mcg total) by Transurethral route as needed for erectile dysfunction. use no more than 3 times per week 6 each 5   aspirin 81 MG tablet Take 81 mg by mouth daily.     B-D BLUNT FILL NEEDLE 18G X 1-1/2" MISC USE AS INSTRUCTED 6 each 1   baclofen (LIORESAL) 20 MG tablet Take 1 tablet (20 mg total) by mouth 3 (three) times daily. (Patient taking differently: Take 20 mg by mouth 3 (three) times daily as needed for muscle spasms.) 270 each 3   BD DISP NEEDLES 21G X 1-1/2" MISC USE AS DIRECTED 100 each 0   calcium carbonate (TUMS - DOSED IN MG ELEMENTAL CALCIUM) 500 MG chewable tablet Chew 500 mg by mouth daily as needed for indigestion or heartburn.     Carboxymethylcellulose Sodium (THERATEARS OP) Place 1 drop into both eyes 2 (two) times daily.     cetirizine-pseudoephedrine (ZYRTEC-D) 5-120 MG tablet Take 1 tablet by mouth daily as needed for allergies.      cyclobenzaprine (FLEXERIL) 5 MG tablet Take 5-10 mg by mouth at bedtime as needed for muscle spasms.     diltiazem 2 % GEL Diltiazem gel 2% apply rectally twice daily for 4 weeks (Patient taking differently: Apply 1 Application topically at bedtime.) 30 g 6   emtricitabine-rilpivir-tenofovir AF (ODEFSEY) 200-25-25 MG TABS tablet Take 1 tablet by mouth daily.     gabapentin (NEURONTIN) 300 MG capsule Take 300 mg by mouth 3 (three) times daily.     hydrochlorothiazide (HYDRODIURIL) 50 MG tablet TAKE 1 TABLET BY MOUTH DAILY 90 tablet 3   HYDROcodone-acetaminophen (NORCO) 7.5-325 MG tablet Take 1 tablet by mouth every 6 (six)  hours as needed for severe pain. 120 tablet 0   hydrocortisone (ANUSOL-HC) 25 MG suppository UNWRAP AND INSERT 1 SUPPOSITORY  RECTALLY RECTALLY TWICE DAILY .  USE FOR 10 DAYS AFTER A BM AND  THEN USE AS NEEDED 180 suppository 1   linaclotide (LINZESS) 290 MCG CAPS capsule Take 1 capsule (290 mcg total) by mouth daily before breakfast. 90 capsule 4   Melatonin 10 MG TABS Take 10 mg by mouth at bedtime.     methocarbamol (ROBAXIN) 500 MG tablet Take 1 tablet by mouth 4 (four) times daily as needed for muscle spasms.     methocarbamol (ROBAXIN) 500 MG tablet Take 1 tablet (500 mg total) by mouth every 6 (six) hours as needed for muscle spasms. 30 tablet 2   montelukast (SINGULAIR) 10 MG tablet Take 1 tablet (10 mg total) by mouth daily. 90 tablet 3   Multiple Vitamin (MULTIVITAMIN WITH MINERALS) TABS tablet Take 1 tablet by mouth daily. COMPLETE MULTIVITAMIN MEN'S 50+     polyethylene glycol powder (GLYCOLAX/MIRALAX) powder Take 17 g by mouth at bedtime.     potassium chloride (KLOR-CON M) 10 MEQ tablet TAKE 1 TABLET BY MOUTH DAILY 90 tablet 3   RESTASIS 0.05 % ophthalmic emulsion Place 1 drop into both eyes 2 (two) times daily as needed (dry eyes).  Suvorexant (BELSOMRA) 10 MG TABS TAKE 1 TABLET BY MOUTH AT  BEDTIME AS NEEDED (Patient taking differently: Take 1 tablet by mouth at bedtime.) 90 tablet 1   tadalafil (CIALIS) 5 MG tablet TAKE 1 TABLET EVERY DAY 90 tablet 1   testosterone enanthate (DELATESTRYL) 200 MG/ML injection Inject 1 mL (200 mg total) into the muscle every 14 (fourteen) days. 5 mL 4   atorvastatin (LIPITOR) 40 MG tablet Take 40 mg by mouth at bedtime.      No facility-administered medications prior to visit.    ROS: Review of Systems  Constitutional:  Negative for appetite change, fatigue and unexpected weight change.  HENT:  Negative for congestion, nosebleeds, sneezing, sore throat and trouble swallowing.   Eyes:  Negative for itching and visual disturbance.   Respiratory:  Negative for cough.   Cardiovascular:  Negative for chest pain, palpitations and leg swelling.  Gastrointestinal:  Negative for abdominal distention, blood in stool, diarrhea and nausea.  Genitourinary:  Negative for frequency and hematuria.  Musculoskeletal:  Positive for back pain. Negative for gait problem, joint swelling and neck pain.  Skin:  Negative for rash.  Neurological:  Negative for dizziness, tremors, speech difficulty and weakness.  Psychiatric/Behavioral:  Negative for agitation, dysphoric mood, sleep disturbance and suicidal ideas. The patient is not nervous/anxious.     Objective:  BP 130/80 (BP Location: Left Arm, Patient Position: Sitting, Cuff Size: Normal)   Pulse (!) 40   Temp 98 F (36.7 C) (Oral)   Ht 5\' 10"  (1.778 m)   Wt 133 lb (60.3 kg)   SpO2 90%   BMI 19.08 kg/m   BP Readings from Last 3 Encounters:  05/15/23 130/80  02/16/23 (!) 152/79  02/06/23 (!) 142/82    Wt Readings from Last 3 Encounters:  05/15/23 133 lb (60.3 kg)  02/14/23 130 lb (59 kg)  02/06/23 135 lb 12.8 oz (61.6 kg)    Physical Exam Constitutional:      General: He is not in acute distress.    Appearance: He is well-developed.     Comments: NAD  Eyes:     Conjunctiva/sclera: Conjunctivae normal.     Pupils: Pupils are equal, round, and reactive to light.  Neck:     Thyroid: No thyromegaly.     Vascular: No JVD.  Cardiovascular:     Rate and Rhythm: Normal rate and regular rhythm.     Heart sounds: Normal heart sounds. No murmur heard.    No friction rub. No gallop.  Pulmonary:     Effort: Pulmonary effort is normal. No respiratory distress.     Breath sounds: Normal breath sounds. No wheezing or rales.  Chest:     Chest wall: No tenderness.  Abdominal:     General: Bowel sounds are normal. There is no distension.     Palpations: Abdomen is soft. There is no mass.     Tenderness: There is no abdominal tenderness. There is no guarding or rebound.   Musculoskeletal:        General: Tenderness present. Normal range of motion.     Cervical back: Normal range of motion.  Lymphadenopathy:     Cervical: No cervical adenopathy.  Skin:    General: Skin is warm and dry.     Findings: No rash.  Neurological:     Mental Status: He is alert and oriented to person, place, and time.     Cranial Nerves: No cranial nerve deficit.     Motor: No abnormal  muscle tone.     Coordination: Coordination normal.     Gait: Gait normal.     Deep Tendon Reflexes: Reflexes are normal and symmetric.  Psychiatric:        Behavior: Behavior normal.        Thought Content: Thought content normal.        Judgment: Judgment normal.    LS w/no obvious tenderness Lab Results  Component Value Date   WBC 4.6 02/06/2023   HGB 16.2 02/06/2023   HCT 46.9 02/06/2023   PLT 179 02/06/2023   GLUCOSE 98 02/06/2023   CHOL 125 11/20/2022   TRIG 46.0 11/20/2022   HDL 64.80 11/20/2022   LDLDIRECT 113.3 05/21/2006   LDLCALC 51 11/20/2022   ALT 14 11/20/2022   AST 24 11/20/2022   NA 134 (L) 02/06/2023   K 3.5 02/06/2023   CL 96 (L) 02/06/2023   CREATININE 1.08 02/06/2023   BUN 9 02/06/2023   CO2 29 02/06/2023   TSH 3.14 11/20/2022   PSA 1.39 11/20/2022   INR 1.0 07/06/2020    DG Lumbar Spine 2-3 Views  Result Date: 02/15/2023 CLINICAL DATA:  L3 through L5 posterior decompression and fusion. EXAM: LUMBAR SPINE - 2-3 VIEW COMPARISON:  Lumbar spine MRI 07/04/2022 and intraoperative images 1 day prior FINDINGS: Two C-arm fluoroscopic images were obtained intraoperatively and submitted for post operative interpretation. The lowest formed disc space is designated L5-S1 which is in keeping with the prior lumbar spine MRI report. The patient is status post posterior instrumented fusion with interbody spacer placement at L3 through L5. Hardware alignment is grossly within expected limits, without evidence of complication. Compression deformity of the L4 vertebral body is  unchanged. Fluoro time 3 minutes 20 seconds, dose 75.8 mGy. Please see the performing provider's procedural report for further detail. IMPRESSION: Intraoperative images during L3 through L5 posterior instrumented fusion and decompression as above. Electronically Signed   By: Lesia Hausen M.D.   On: 02/15/2023 13:00   DG C-Arm 1-60 Min-No Report  Result Date: 02/15/2023 Fluoroscopy was utilized by the requesting physician.  No radiographic interpretation.   DG C-Arm 1-60 Min-No Report  Result Date: 02/15/2023 Fluoroscopy was utilized by the requesting physician.  No radiographic interpretation.   DG Lumbar Spine 2-3 Views  Result Date: 02/14/2023 CLINICAL DATA:  Elective surgery. EXAM: LUMBAR SPINE - 2-3 VIEW COMPARISON:  None Available. FINDINGS: Two coned fluoroscopic spot views of the lumbar spine obtained in the operating room. Interbody spacers at 2 contiguous levels, presumably at L3-L4 and L4-L5. Fluoroscopy time 252.6 seconds, dose 112.21 mGy. IMPRESSION: Intraoperative fluoroscopy during lumbar spine surgery. Electronically Signed   By: Narda Rutherford M.D.   On: 02/14/2023 12:29   DG C-Arm 1-60 Min-No Report  Result Date: 02/14/2023 Fluoroscopy was utilized by the requesting physician.  No radiographic interpretation.   DG C-Arm 1-60 Min-No Report  Result Date: 02/14/2023 Fluoroscopy was utilized by the requesting physician.  No radiographic interpretation.   DG C-Arm 1-60 Min-No Report  Result Date: 02/14/2023 Fluoroscopy was utilized by the requesting physician.  No radiographic interpretation.    Assessment & Plan:   Problem List Items Addressed This Visit     Hypogonadism male - Primary    Testosterone replacment q 2 weeks IM and using caverject  Potential benefits of a long term testosterone use as well as potential risks  and complications were explained to the patient and were aknowledged. Testosterone levels are low however it is working clinically - no dose  change       Relevant Orders   Testosterone   Comprehensive metabolic panel   HTN (hypertension)    Cont on HCTZ, Kdur      Erectile dysfunction    Caverjects prn n/a Muse - n/a      Polycythemia    Cont to monitor CBC      Relevant Orders   CBC with Differential/Platelet   Spinal stenosis of lumbosacral region    S/p surgery - walking 5 mi/day         No orders of the defined types were placed in this encounter.     Follow-up: Return in about 6 months (around 11/13/2023) for Wellness Exam.  Sonda Primes, MD

## 2023-05-15 NOTE — Assessment & Plan Note (Signed)
Cont to monitor CBC

## 2023-05-15 NOTE — Assessment & Plan Note (Signed)
Testosterone replacment q 2 weeks IM and using caverject ? Potential benefits of a long term testosterone use as well as potential risks  and complications were explained to the patient and were aknowledged. ?Testosterone levels are low however it is working clinically - no dose change ?

## 2023-05-15 NOTE — Assessment & Plan Note (Signed)
Cont on HCTZ, Kdur

## 2023-05-21 ENCOUNTER — Ambulatory Visit: Payer: Medicare Other | Admitting: Internal Medicine

## 2023-06-01 ENCOUNTER — Telehealth: Payer: Self-pay | Admitting: Internal Medicine

## 2023-06-01 NOTE — Telephone Encounter (Signed)
Ashante states that she sending a med request for She also mentuined that she will be sending [place] over the med request via fax.  CB # [id number] ASHANTE-- [facility] Prescription Request  06/01/2023  LOV: 05/15/2023  What is the name of the medication or equipment? testosterone enanthate (DELATESTRYL) 200 MG/ML injection   Have you contacted your pharmacy to request a refill? Yes   Which pharmacy would you like this sent to?  Pharmacy  [facility] - [place], [place] - [address] [place] [address] Phone: [phone]  Fax: [phone]    Patient notified that their request is being sent to the clinical staff for review and that they should receive a response within 2 business days.   Please advise at Mobile [phone] (mobile)

## 2023-06-04 MED ORDER — TESTOSTERONE ENANTHATE 200 MG/ML IM SOLN: 200.0000 mg | INTRAMUSCULAR | 4 refills | Status: DC

## 2023-06-04 NOTE — Telephone Encounter (Signed)
Okay.  Thanks.

## 2023-06-20 ENCOUNTER — Other Ambulatory Visit: Payer: Self-pay | Admitting: Internal Medicine

## 2023-06-24 ENCOUNTER — Other Ambulatory Visit: Payer: Self-pay | Admitting: Gastroenterology

## 2023-07-02 ENCOUNTER — Encounter: Payer: Self-pay | Admitting: Internal Medicine

## 2023-07-02 NOTE — Telephone Encounter (Signed)
Pt states "I called [facility] to get the 3rd fill for Hydrocodone-Acetamin 7.5-325 and was told the last fill was Nov. Would you mind send in [facility] refill for the above for 3 months. Dec 24, Jan 25 and feb 25. Thanks "

## 2023-07-07 ENCOUNTER — Other Ambulatory Visit: Payer: Self-pay | Admitting: Internal Medicine

## 2023-07-07 MED ORDER — HYDROCODONE-ACETAMINOPHEN 7.5-325 MG PO TABS: 1.0000 | ORAL_TABLET | Freq: Four times a day (QID) | ORAL | 0 refills | Status: DC | PRN

## 2023-07-18 HISTORY — PX: MOHS SURGERY: SUR867

## 2023-07-27 ENCOUNTER — Encounter: Payer: Self-pay | Admitting: Gastroenterology

## 2023-07-27 ENCOUNTER — Ambulatory Visit (INDEPENDENT_AMBULATORY_CARE_PROVIDER_SITE_OTHER): Payer: Medicare Other | Admitting: Gastroenterology

## 2023-07-27 ENCOUNTER — Ambulatory Visit (INDEPENDENT_AMBULATORY_CARE_PROVIDER_SITE_OTHER): Payer: Medicare Other

## 2023-07-27 VITALS — BP 138/76 | HR 67 | Ht 70.0 in | Wt 139.1 lb

## 2023-07-27 DIAGNOSIS — Z8719 Personal history of other diseases of the digestive system: Secondary | ICD-10-CM

## 2023-07-27 DIAGNOSIS — K648 Other hemorrhoids: Secondary | ICD-10-CM

## 2023-07-27 DIAGNOSIS — Z0000 Encounter for general adult medical examination without abnormal findings: Secondary | ICD-10-CM

## 2023-07-27 DIAGNOSIS — K602 Anal fissure, unspecified: Secondary | ICD-10-CM | POA: Diagnosis not present

## 2023-07-27 MED ORDER — AMBULATORY NON FORMULARY MEDICATION: 6 refills | Status: DC

## 2023-07-27 MED ORDER — HYDROCORTISONE ACETATE 25 MG RE SUPP: 25.0000 mg | Freq: Two times a day (BID) | RECTAL | 1 refills | Status: DC

## 2023-07-27 NOTE — Progress Notes (Signed)
 Chief Complaint: FU  Referring Provider:  Garald Karlynn GAILS, MD      ASSESSMENT AND PLAN;   #1. Anal fissure and internal hemorrhoids.  #2. H/O constipation (uses hydrocodone , Celebrex ), neg colon Jan 2023  Plan: - Minimize use of pain medications - Continue Metamucil QD with 8 ounces of juice - Continue miralax  17g po qd. - Anusol  HC suppositories 1 BID after the BMs x 10 days PRN, 6 refills. - Linzess  290 QD #90, 4 refills - diltiazem  cream 2% gel BID x 4 weeks 6 refills - No need to repeat colonoscopy unless new problems. - FU PRN    HPI:    Troy Marsh is a 73 y.o. male  With HIV, HTN, OA S/P lumbar fusion 02/2023, neck Sx ACD and right inguinal hernia repair on chronic pain medications For follow-up visit  Doing much better. Here for prescription refill-for Linzess , diltiazem  cream, Anusol  HC suppositories. Has rectal bleeding only when he gets constipated.  Has been doing well with Anusol  HC suppositories, Metamucil, MiraLAX , Linzess .  No abdominal pain.  Denies having any upper GI symptoms.  No weight loss.  No jaundice dark urine or pale stools.  No fever chills or night sweats.  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  Previous GI procedures:  Colon 08/15/2021 - One 6 mm polyp in the mid descending colon, removed with a cold snare. Resected and retrieved. Bx- hyperplastiic. No need to rpt d/t age, unless problems. - Minimal sigmoid diverticulosis - Non-bleeding internal hemorrhoids. - Chronic posterior anal fissure - The examined portion of the ileum was normal. - The examination was otherwise normal on direct and retroflexion views.  Colonoscopy 09/2018 fair prep. - Non-bleeding internal hemorrhoids. - Otherwise grossly normal colonoscopy. - Repeat in 3 years d/t HIV, quality of prep  CT AP 06/2020 No acute abnormality.  No finding to explain  rectal bleeding. Prostatomegaly. Aortic Atherosclerosis (ICD10-I70.0).  Past Medical History:  Diagnosis Date   Arthritis    BPH (benign prostatic hyperplasia)    [facility]  DR. YSIDRO   Cancer [facility])    Skin cancer- basal and squamous   Closed fracture of nasal bones 06/02/2022   Constipation due to pain medication    HIV disease (HCC) 1985   last CD4 10/02/12 was 277   Hyperlipidemia    Hypertension    Hypogonadism male     Past Surgical History:  Procedure Laterality Date   ANTERIOR CERVICAL DECOMP/DISCECTOMY FUSION Bilateral 11/22/2017   Procedure: ANTERIOR CERVICAL DECOMPRESSION FUSION CERVICAL 4-5, CERVICAL 5-6, CERVICAL 6-7 WITH INSTRUMENTATION AND ALLOGRAFT TIME      /   REQUESTED 4 HOURS;  Surgeon: Beuford Anes, MD;  Location: [facility] OR;  Service: Orthopedics;  Laterality: Bilateral;   ANTERIOR LAT LUMBAR FUSION Left 02/14/2023   Procedure: LEFT SIDED LUMBAR THREE - LUMBAR FOUR, LUMBAR FOUR - LUMBAR FIVE LATERAL INTERBODY FUSION WITH INSTRUMENTATION AND ALLOGRAFT;  Surgeon: Beuford Anes, MD;  Location: [facility] OR;  Service: Orthopedics;  Laterality: Left;   BACK SURGERY     COLONOSCOPY W/ POLYPECTOMY     EYE SURGERY Bilateral    Lasik, Cataract   HEMORRHOID SURGERY  1973   INGUINAL HERNIA REPAIR Right 06/11/2018   Procedure: LAPAROSCOPIC RIGHT INGUINAL HERNIA WITH MESH;  Surgeon: Rubin Calamity, MD;  Location: [facility] OR;  Service: General;  Laterality: Right;   INSERTION OF MESH Right 06/11/2018   Procedure: INSERTION OF  MESH;  Surgeon: Rubin Calamity, MD;  Location[facility] OR;  Service: General;  Laterality: Right;   KNEE ARTHROSCOPY Right 2009   KNEE ARTHROSCOPY Left 2011   neck surgery  1984   POSTERIOR CERVICAL FUSION/FORAMINOTOMY N/A 05/29/2018   Procedure: POSTERIOR CERVICAL DECOMPRESSION FUSION CERVICAL 4 - CERVICAL 7 WITH INSTRUMENTATION AND ALLOGRAFT;  Surgeon: Beuford Anes, MD;  Location: [facility] OR;  Service: Orthopedics;  Laterality: N/A;    Family History   Problem Relation Age of Onset   Coronary artery disease Father    Heart attack Father    Hypertension Father    Hyperlipidemia Father    Cancer Father        prostate   Colitis Maternal Grandmother    Diabetes Neg Hx    Colon cancer Neg Hx    Rectal cancer Neg Hx    Stomach cancer Neg Hx    Esophageal cancer Neg Hx     Social History   Tobacco Use   Smoking status: Former    Current packs/day: 0.00    Average packs/day: 2.0 packs/day for 35.0 years (70.0 ttl pk-yrs)    Types: Cigarettes    Start date: 02/09/1969    Quit date: 02/10/2004    Years since quitting: 19.4   Smokeless tobacco: Never  Vaping Use   Vaping status: Never Used  Substance Use Topics   Alcohol  use: No    Comment: quit 2004   Drug use: No    Current Outpatient Medications  Medication Sig Dispense Refill   Abaloparatide (TYMLOS [place]) Inject 80 mg into the skin daily. INJECT ONCE A DAY 80MG .     alprostadil  (MUSE ) 250 MCG pellet 1 each (250 mcg total) by Transurethral route as needed for erectile dysfunction. use no more than 3 times per week (Patient taking differently: 250 mcg by Transurethral route as needed for erectile dysfunction. use no more than 2 times per week) 6 each 5   aspirin 81 MG tablet Take 81 mg by mouth daily.     baclofen  (LIORESAL ) 20 MG tablet Take 1 tablet (20 mg total) by mouth 3 (three) times daily. (Patient taking differently: Take 20 mg by mouth 3 (three) times daily as needed for muscle spasms.) 270 each 3   calcium  carbonate (TUMS - DOSED IN MG ELEMENTAL CALCIUM ) 500 MG chewable tablet Chew 500 mg by mouth daily as needed for indigestion or heartburn.     cetirizine-pseudoephedrine (ZYRTEC-D) 5-120 MG tablet Take 1 tablet by mouth daily as needed for allergies.      cyclobenzaprine  (FLEXERIL ) 5 MG tablet Take 5-10 mg by mouth at bedtime as needed for muscle spasms.     diltiazem  2 % GEL Diltiazem  gel 2% apply rectally twice daily for 4 weeks (Patient taking differently: Apply 1  Application topically at bedtime.) 30 g 6   emtricitabine -rilpivir-tenofovir  AF (ODEFSEY ) 200-25-25 MG TABS tablet Take 1 tablet by mouth daily.     gabapentin  (NEURONTIN ) 300 MG capsule Take 300 mg by mouth 3 (three) times daily.     hydrochlorothiazide  (HYDRODIURIL ) 50 MG tablet TAKE 1 TABLET BY MOUTH DAILY 90 tablet 3   HYDROcodone -acetaminophen  (NORCO) 7.5-325 MG tablet Take 1 tablet by mouth every 6 (six) hours as needed for severe pain (pain score 7-10). 120 tablet 0   hydrocortisone  (ANUSOL -HC) 25 MG suppository UNWRAP AND INSERT 1 SUPPOSITORY  RECTALLY RECTALLY TWICE DAILY .  USE FOR 10 DAYS AFTER A BM AND  THEN USE AS NEEDED 180 suppository 1   LINZESS   290 MCG CAPS capsule TAKE 1 CAPSULE BY MOUTH DAILY  BEFORE BREAKFAST 90 capsule 4   Melatonin 10 MG TABS Take 10 mg by mouth at bedtime.     methocarbamol  (ROBAXIN ) 500 MG tablet Take 1 tablet (500 mg total) by mouth every 6 (six) hours as needed for muscle spasms. 30 tablet 2   montelukast  (SINGULAIR ) 10 MG tablet TAKE 1 TABLET BY MOUTH DAILY 90 tablet 3   Multiple Vitamin (MULTIVITAMIN WITH MINERALS) TABS tablet Take 1 tablet by mouth daily. COMPLETE MULTIVITAMIN MEN'S 50+     polyethylene glycol powder (GLYCOLAX /MIRALAX ) powder Take 17 g by mouth at bedtime.     potassium chloride  (KLOR-CON  M) 10 MEQ tablet TAKE 1 TABLET BY MOUTH DAILY 90 tablet 3   RESTASIS  0.05 % ophthalmic emulsion Place 1 drop into both eyes 2 (two) times daily as needed (dry eyes).     Suvorexant  (BELSOMRA ) 10 MG TABS TAKE 1 TABLET BY MOUTH AT  BEDTIME AS NEEDED (Patient taking differently: Take 1 tablet by mouth at bedtime.) 90 tablet 1   tadalafil  (CIALIS ) 5 MG tablet TAKE 1 TABLET EVERY DAY 90 tablet 1   testosterone  enanthate (DELATESTRYL ) 200 MG/ML injection Inject 1 mL (200 mg total) into the muscle every 14 (fourteen) days. 5 mL 4   atorvastatin  (LIPITOR) 40 MG tablet Take 40 mg by mouth at bedtime.      No current facility-administered medications for this  visit.    No Known Allergies  Review of Systems:  Negative except for HPI.    Physical Exam:    BP 138/76   Pulse 67   Ht 5' 10 (1.778 m)   Wt 139 lb 2 oz (63.1 kg)   BMI 19.96 kg/m  Filed Weights   07/27/23 0845  Weight: 139 lb 2 oz (63.1 kg)   Constitutional:  Well-developed, in no acute distress. Psychiatric: Normal mood and affect. Behavior is normal. HEENT: Pupils normal.  Conjunctivae are normal. No scleral icterus. Cardiovascular: Normal rate, regular rhythm. No edema Pulmonary/chest: Effort normal and breath sounds normal. No wheezing, rales or rhonchi. Abdominal: Soft, nondistended. Nontender. Bowel sounds active throughout. There are no masses palpable. No hepatomegaly. Neurological: Alert and oriented to person place and time. Skin: Skin is warm and dry. No rashes noted.  Data Reviewed: I have personally reviewed following labs and imaging studies  CBC:    Latest Ref Rng & Units 05/15/2023    8:51 AM 02/06/2023    9:00 AM 11/20/2022    8:18 AM  CBC  WBC 4.0 - 10.5 K/uL 9.3  4.6  4.7   Hemoglobin 13.0 - 17.0 g/dL 83.6  83.7  83.5   Hematocrit 39.0 - 52.0 % 49.2  46.9  47.3   Platelets 150.0 - 400.0 K/uL 205.0  179  217.0     CMP:    Latest Ref Rng & Units 05/15/2023    8:51 AM 02/06/2023    9:00 AM 11/20/2022    8:18 AM  CMP  Glucose 70 - 99 mg/dL 894  98  93   BUN 6 - 23 mg/dL 26  9  9    Creatinine 0.40 - 1.50 mg/dL 9.00  8.91  8.73   Sodium 135 - 145 mEq/L 132  134  135   Potassium 3.5 - 5.1 mEq/L 4.0  3.5  4.3   Chloride 96 - 112 mEq/L 94  96  92   CO2 19 - 32 mEq/L 30  29  34   Calcium   8.4 - 10.5 mg/dL 89.0  9.1  9.5   Total Protein 6.0 - 8.3 g/dL 7.2   6.0   Total Bilirubin 0.2 - 1.2 mg/dL 0.7   0.9   Alkaline Phos 39 - 117 U/L 109   49   AST 0 - 37 U/L 20   24   ALT 0 - 53 U/L 13   14   I spent 25 minutes of face-to-face time with the patient. Greater than 50% of the time was spent counseling and coordinating care.     Anselm Bring, MD  07/27/2023, 8:58 AM  Cc: Plotnikov, Karlynn GAILS, MD

## 2023-07-27 NOTE — Patient Instructions (Addendum)
 _______________________________________________________  If your blood pressure at your visit was 140/90 or greater, please contact your primary care physician to follow up on this.  _______________________________________________________  If you are age 74 or older, your body mass index should be between 23-30. Your Body mass index is 19.96 kg/m. If this is out of the aforementioned range listed, please consider follow up with your Primary Care Provider.  If you are age 68 or younger, your body mass index should be between 19-25. Your Body mass index is 19.96 kg/m. If this is out of the aformentioned range listed, please consider follow up with your Primary Care Provider.   ________________________________________________________  The [place] [facility] providers would like to encourage you to use MYCHART to communicate with providers for non-urgent requests or questions.  Due to long hold times on the telephone, sending your provider a message by [facility] may be a faster and more efficient way to get a response.  Please allow 48 business hours for a response.  Please remember that this is for non-urgent requests.  _______________________________________________________  Continue medications  Thank you,  Dr. Lynnie Bring

## 2023-07-27 NOTE — Patient Instructions (Addendum)
 Troy Marsh , Thank you for taking time to come for your Medicare Wellness Visit. I appreciate your ongoing commitment to your health goals. Please review the following plan we discussed and let me know if I can assist you in the future.   Referrals/Orders/Follow-Ups/Clinician Recommendations: none  This is a list of the screening recommended for you and due dates:  Health Maintenance  Topic Date Due   Hepatitis C Screening  Never done   COVID-19 Vaccine (10 - 2024-25 season) 07/02/2023   Medicare Annual Wellness Visit  07/26/2024   DTaP/Tdap/Td vaccine (7 - Td or Tdap) 12/29/2026   Colon Cancer Screening  08/16/2031   Pneumonia Vaccine  Completed   Flu Shot  Completed   HPV Vaccine  Aged Out   Zoster (Shingles) Vaccine  Discontinued    Advanced directives: (ACP Link)Information on Advanced Care Planning can be found at [place]  Secretary of [facility] Advance Health Care Directives Advance Health Care Directives ([email or web address]   Next Medicare Annual Wellness Visit scheduled for next year: Yes  insert Preventive Care attachment Insert FALL PREVENTION attachment if needed

## 2023-07-27 NOTE — Progress Notes (Addendum)
 Subjective:   Troy Marsh is a 73 y.o. male who presents for Medicare Annual/Subsequent preventive examination.  Visit Complete: Virtual I connected with  Troy Marsh on 07/27/23 by a audio enabled telemedicine application and verified that I am speaking with the correct person using two identifiers.  This patient declined Interactive audio and acupuncturist. Therefore the visit was completed with audio only.  Patient Location: Home  Provider Location: Home Office  I discussed the limitations of evaluation and management by telemedicine. The patient expressed understanding and agreed to proceed.  Vital Signs: Because this visit was a virtual/telehealth visit, some criteria may be missing or patient reported. Any vitals not documented were not able to be obtained and vitals that have been documented are patient reported.  Patient Medicare AWV questionnaire was completed by the patient on 07/26/2023; I have confirmed that all information answered by patient is correct and no changes since this date.  Cardiac Risk Factors include: advanced age (>57men, >50 women);hypertension;male gender     Objective:    Today's Vitals   07/27/23 1007  PainSc: 4    There is no height or weight on file to calculate BMI.     07/27/2023   10:13 AM 02/06/2023    8:32 AM 01/17/2023   12:09 PM 01/17/2023   10:42 AM 12/17/2022    1:41 PM 07/27/2022    1:05 PM 05/28/2022    9:44 AM  Advanced Directives  Does Patient Have a Medical Advance Directive? No No Yes Yes No Yes No  Type of Chief Of Staff of [place];Living will  Healthcare Power of [place];Living will   Copy of Healthcare Power of Attorney in Chart?    No - copy requested  No - copy requested   Would patient like information on creating a medical advance directive?  Yes (MAU/Ambulatory/Procedural Areas - Information given)   No - Patient declined      Current Medications  (verified) Outpatient Encounter Medications as of 07/27/2023  Medication Sig   Abaloparatide (TYMLOS ) Inject 80 mg into the skin daily. INJECT ONCE A DAY 80MG .   alprostadil  (MUSE ) 250 MCG pellet 1 each (250 mcg total) by Transurethral route as needed for erectile dysfunction. use no more than 3 times per week (Patient taking differently: 250 mcg by Transurethral route as needed for erectile dysfunction. use no more than 2 times per week)   AMBULATORY NON FORMULARY MEDICATION Medication Name: diltiazem  cream 2% gel apply 2 times a day x 4 weeks   aspirin 81 MG tablet Take 81 mg by mouth daily.   baclofen  (LIORESAL ) 20 MG tablet Take 1 tablet (20 mg total) by mouth 3 (three) times daily. (Patient taking differently: Take 20 mg by mouth 3 (three) times daily as needed for muscle spasms.)   calcium  carbonate (TUMS - DOSED IN MG ELEMENTAL CALCIUM ) 500 MG chewable tablet Chew 500 mg by mouth daily as needed for indigestion or heartburn.   cetirizine-pseudoephedrine (ZYRTEC-D) 5-120 MG tablet Take 1 tablet by mouth daily as needed for allergies.    cyclobenzaprine  (FLEXERIL ) 5 MG tablet Take 5-10 mg by mouth at bedtime as needed for muscle spasms.   diltiazem  2 % GEL Diltiazem  gel 2% apply rectally twice daily for 4 weeks (Patient taking differently: Apply 1 Application topically at bedtime.)   emtricitabine -rilpivir-tenofovir  AF (ODEFSEY ) 200-25-25 MG TABS tablet Take 1 tablet by mouth daily.   gabapentin  (NEURONTIN ) 300 MG capsule Take 300 mg  by mouth 3 (three) times daily.   hydrochlorothiazide  (HYDRODIURIL ) 50 MG tablet TAKE 1 TABLET BY MOUTH DAILY   HYDROcodone -acetaminophen  (NORCO) 7.5-325 MG tablet Take 1 tablet by mouth every 6 (six) hours as needed for severe pain (pain score 7-10).   hydrocortisone  (ANUSOL -HC) 25 MG suppository Place 1 suppository (25 mg total) rectally 2 (two) times daily.   LINZESS  290 MCG CAPS capsule TAKE 1 CAPSULE BY MOUTH DAILY  BEFORE BREAKFAST   Melatonin 10 MG TABS  Take 10 mg by mouth at bedtime.   methocarbamol  (ROBAXIN ) 500 MG tablet Take 1 tablet (500 mg total) by mouth every 6 (six) hours as needed for muscle spasms.   montelukast  (SINGULAIR ) 10 MG tablet TAKE 1 TABLET BY MOUTH DAILY   Multiple Vitamin (MULTIVITAMIN WITH MINERALS) TABS tablet Take 1 tablet by mouth daily. COMPLETE MULTIVITAMIN MEN'S 50+   polyethylene glycol powder (GLYCOLAX /MIRALAX ) powder Take 17 g by mouth at bedtime.   potassium chloride  (KLOR-CON  M) 10 MEQ tablet TAKE 1 TABLET BY MOUTH DAILY   RESTASIS  0.05 % ophthalmic emulsion Place 1 drop into both eyes 2 (two) times daily as needed (dry eyes).   Suvorexant  (BELSOMRA ) 10 MG TABS TAKE 1 TABLET BY MOUTH AT  BEDTIME AS NEEDED (Patient taking differently: Take 1 tablet by mouth at bedtime.)   tadalafil  (CIALIS ) 5 MG tablet TAKE 1 TABLET EVERY DAY   testosterone  enanthate (DELATESTRYL ) 200 MG/ML injection Inject 1 mL (200 mg total) into the muscle every 14 (fourteen) days.   atorvastatin  (LIPITOR) 40 MG tablet Take 40 mg by mouth at bedtime.    No facility-administered encounter medications on file as of 07/27/2023.    Allergies (verified) Patient has no known allergies.   History: Past Medical History:  Diagnosis Date   Arthritis    BPH (benign prostatic hyperplasia)    [facility]  DR. YSIDRO   Cancer [facility])    Skin cancer- basal and squamous   Closed fracture of nasal bones 06/02/2022   Constipation due to pain medication    HIV disease (HCC) 1985   last CD4 10/02/12 was 277   Hyperlipidemia    Hypertension    Hypogonadism male    Past Surgical History:  Procedure Laterality Date   ANTERIOR CERVICAL DECOMP/DISCECTOMY FUSION Bilateral 11/22/2017   Procedure: ANTERIOR CERVICAL DECOMPRESSION FUSION CERVICAL 4-5, CERVICAL 5-6, CERVICAL 6-7 WITH INSTRUMENTATION AND ALLOGRAFT TIME      /   REQUESTED 4 HOURS;  Surgeon: Beuford Anes, MD;  Location: [facility] OR;  Service: Orthopedics;  Laterality: Bilateral;   ANTERIOR LAT  LUMBAR FUSION Left 02/14/2023   Procedure: LEFT SIDED LUMBAR THREE - LUMBAR FOUR, LUMBAR FOUR - LUMBAR FIVE LATERAL INTERBODY FUSION WITH INSTRUMENTATION AND ALLOGRAFT;  Surgeon: Beuford Anes, MD;  Location: [facility] OR;  Service: Orthopedics;  Laterality: Left;   BACK SURGERY     COLONOSCOPY W/ POLYPECTOMY     EYE SURGERY Bilateral    Lasik, Cataract   HEMORRHOID SURGERY  1973   INGUINAL HERNIA REPAIR Right 06/11/2018   Procedure: LAPAROSCOPIC RIGHT INGUINAL HERNIA WITH MESH;  Surgeon: Rubin Calamity, MD;  Location: [facility] OR;  Service: General;  Laterality: Right;   INSERTION OF MESH Right 06/11/2018   Procedure: INSERTION OF MESH;  Surgeon: Rubin Calamity, MD;  Location: [facility] OR;  Service: General;  Laterality: Right;   KNEE ARTHROSCOPY Right 2009   KNEE ARTHROSCOPY Left 2011   neck surgery  1984   POSTERIOR CERVICAL FUSION/FORAMINOTOMY N/A 05/29/2018   Procedure: POSTERIOR CERVICAL DECOMPRESSION  FUSION CERVICAL 4 - CERVICAL 7 WITH INSTRUMENTATION AND ALLOGRAFT;  Surgeon: Beuford Anes, MD;  Location: [facility] OR;  Service: Orthopedics;  Laterality: N/A;   Family History  Problem Relation Age of Onset   Coronary artery disease Father    Heart attack Father    Hypertension Father    Hyperlipidemia Father    Cancer Father        prostate   Colitis Maternal Grandmother    Diabetes Neg Hx    Colon cancer Neg Hx    Rectal cancer Neg Hx    Stomach cancer Neg Hx    Esophageal cancer Neg Hx    Social History   Socioeconomic History   Marital status: Married    Spouse name: Glean Raspberry (partner)   Number of children: Not on file   Years of education: 16   Highest education level: Associate degree: occupational, scientist, product/process development, or vocational program  Occupational History   Occupation: retired    Associate Professor: [employer]    Comment: government work  Tobacco Use   Smoking status: Former    Current packs/day: 0.00    Average packs/day: 2.0 packs/day for 35.0 years (70.0 ttl pk-yrs)     Types: Cigarettes    Start date: 02/09/1969    Quit date: 02/10/2004    Years since quitting: 19.4   Smokeless tobacco: Never  Vaping Use   Vaping status: Never Used  Substance and Sexual Activity   Alcohol  use: No    Comment: quit 2004   Drug use: No   Sexual activity: Yes    Partners: Male    Birth control/protection: Condom    Comment: monogamous w/ single male partner 1981  Other Topics Concern   Not on file  Social History Narrative   [employer]. Work - [employer] - retired 2009. Long term monogamous relationship. Lives in his own home.             Social Drivers of Corporate Investment Banker Strain: Low Risk  (07/26/2023)   Overall Financial Resource Strain (CARDIA)    Difficulty of Paying Living Expenses: Not hard at all  Food Insecurity: No Food Insecurity (07/26/2023)   Hunger Vital Sign    Worried About Running Out of Food in the Last Year: Never true    Ran Out of Food in the Last Year: Never true  Transportation Needs: No Transportation Needs (07/26/2023)   PRAPARE - Administrator, Civil Service (Medical): No    Lack of Transportation (Non-Medical): No  Physical Activity: Sufficiently Active (07/26/2023)   Exercise Vital Sign    Days of Exercise per Week: 3 days    Minutes of Exercise per Session: 60 min  Stress: No Stress Concern Present (07/26/2023)   [employer] of Occupational Health - Occupational Stress Questionnaire    Feeling of Stress : Only a little  Social Connections: Socially Integrated (07/26/2023)   Social Connection and Isolation Panel [NHANES]    Frequency of Communication with Friends and Family: Three times a week    Frequency of Social Gatherings with Friends and Family: Once a week    Attends Religious Services: More than 4 times per year    Active Member of [employer] or Organizations: No    Attends Engineer, Structural: 1 to 4 times per year    Marital Status: Married    Tobacco  Counseling Counseling given: Not Answered   Clinical Intake:  Pre-visit preparation completed: Yes  Pain : 0-10 Pain Score: 4  Pain Type: Chronic pain Pain Location: Back Pain Descriptors / Indicators: Aching Pain Onset: More than a month ago Pain Frequency: Constant     Nutritional Risks: None Diabetes: No  How often do you need to have someone help you when you read instructions, pamphlets, or other written materials from your doctor or pharmacy?: 1 - Never  Interpreter Needed?: No  Information entered by :: NAllen LPN   Activities of Daily Living    07/26/2023    9:59 AM 02/06/2023    8:37 AM  In your present state of health, do you have any difficulty performing the following activities:  Hearing? 0   Vision? 0   Difficulty concentrating or making decisions? 0   Walking or climbing stairs? 0   Dressing or bathing? 0   Doing errands, shopping? 0 0  Preparing Food and eating ? N   Using the Toilet? N   In the past six months, have you accidently leaked urine? N   Do you have problems with loss of bowel control? N   Managing your Medications? N   Managing your Finances? N   Housekeeping or managing your Housekeeping? Y   Comment hires someone to come in     Patient Care Team: Plotnikov, Karlynn GAILS, MD as PCP - General (Internal Medicine) Jinny Lenis, MD as Referring Physician (Infectious Diseases) Beuford Anes, MD as Consulting Physician (Orthopedic Surgery) Nieves Cough, MD as Consulting Physician (Urology) Charlanne Groom, MD as Consulting Physician (Gastroenterology) Regenia Prentice Clack, MD as Consulting Physician (Ophthalmology) Margeret Kotyk, MD as Referring Physician (Physical Medicine and Rehabilitation)  Indicate any recent Medical Services you may have received from other than [facility] providers in the past year (date may be approximate).     Assessment:   This is a routine wellness examination for Troy Marsh.  Hearing/Vision screen Hearing  Screening - Comments:: Denies hearing issues Vision Screening - Comments:: Regular eye exams, [facility]   Goals Addressed             This Visit's Progress    Patient Stated       07/27/2023, watch what he eats       Depression Screen    07/27/2023   10:15 AM 11/20/2022    7:49 AM 07/27/2022    1:12 PM 11/16/2021    8:06 AM 05/18/2021    8:21 AM 05/18/2020    4:25 PM 02/10/2020   11:02 AM  PHQ 2/9 Scores  PHQ - 2 Score 0 0 0 0 0 0 0  PHQ- 9 Score 0   1       Fall Risk    07/26/2023    9:59 AM 11/20/2022    7:49 AM 07/27/2022    1:09 PM 07/03/2022    9:29 AM 11/16/2021    8:07 AM  Fall Risk   Falls in the past year? 0 0 1 1 0  Number falls in past yr: 0 0 0 0 0  Injury with Fall? 0 0 1 1 0  Risk for fall due to : Medication side effect No Fall Risks   No Fall Risks  Follow up Falls prevention discussed;Falls evaluation completed Falls evaluation completed       MEDICARE RISK AT HOME: Medicare Risk at Home Any stairs in or around the home?: (Patient-Rptd) Yes If so, are there any without handrails?: (Patient-Rptd) No Home free of loose throw rugs in walkways, pet beds, electrical cords, etc?: (Patient-Rptd) Yes  Adequate lighting in your home to reduce risk of falls?: (Patient-Rptd) Yes Life alert?: (Patient-Rptd) No Use of a cane, walker or w/c?: (Patient-Rptd) No Grab bars in the bathroom?: (Patient-Rptd) Yes Shower chair or bench in shower?: (Patient-Rptd) Yes Elevated toilet seat or a handicapped toilet?: (Patient-Rptd) Yes  TIMED UP AND GO:  Was the test performed?  No    Cognitive Function:    04/24/2017    9:47 AM  MMSE - Mini Mental State Exam  Orientation to time 5  Orientation to Place 5  Registration 3  Attention/ Calculation 5  Recall 2  Language- name 2 objects 2  Language- repeat 1  Language- follow 3 step command 3  Language- read & follow direction 1  Write a sentence 1  Copy design 1  Total score 29        07/27/2023   10:17 AM  07/27/2022    1:10 PM  6CIT Screen  What Year? 0 points 0 points  What month? 0 points 0 points  What time? 0 points 0 points  Count back from 20 0 points 0 points  Months in reverse 0 points 0 points  Repeat phrase 2 points 0 points  Total Score 2 points 0 points    Immunizations Immunization History  Administered Date(s) Administered   Fluad Quad(high Dose 65+) 04/06/2022   Influenza Split 04/20/2011   Influenza, High Dose Seasonal PF 04/24/2016, 04/24/2016, 04/24/2017, 04/24/2017, 04/24/2018, 04/24/2018, 04/22/2019, 05/12/2021   Influenza,inj,Quad PF,6+ Mos 03/30/2014, 04/12/2015   Influenza,inj,quad, With Preservative 05/12/2021   Influenza-Unspecified 03/21/2013, 04/22/2019, 05/04/2019, 03/31/2020, 05/07/2023   MODERNA COVID-19 SARS-COV-2 PEDS BIVALENT BOOSTER 62yr-59yr 05/07/2023   PFIZER Comirnaty(Gray Top)Covid-19 Tri-Sucrose Vaccine 03/11/2021   PFIZER(Purple Top)SARS-COV-2 Vaccination 09/09/2019, 09/30/2019, 03/31/2020, 10/29/2020, 05/12/2021   PNEUMOCOCCAL CONJUGATE-20 11/20/2022   PPD Test 07/22/2007, 11/02/2008, 09/20/2009, 02/13/2011   Pfizer(Comirnaty)Fall Seasonal Vaccine 12 years and older 11/07/2022   Pneumococcal Conjugate-13 09/30/2012   Pneumococcal Polysaccharide-23 12/05/2004, 09/20/2009, 04/24/2016   Respiratory Syncytial Virus Vaccine,Recomb Aduvanted(Arexvy) 06/28/2022   Rsv, Bivalent, Protein Subunit Rsvpref,pf (Abrysvo) 06/28/2022   Td 12/18/2008, 04/12/2009   Td (Adult), 2 Lf Tetanus Toxid, Preservative Free 12/18/2008, 04/12/2009   Tdap 06/25/2006, 12/28/2016   Unspecified SARS-COV-2 Vaccination 04/06/2022   Zoster, Live 07/22/2011, 04/12/2015    TDAP status: Up to date  Flu Vaccine status: Up to date  Pneumococcal vaccine status: Up to date  Covid-19 vaccine status: Completed vaccines  Qualifies for Shingles Vaccine? Yes   Zostavax completed Yes   Shingrix Completed?: Yes  Screening Tests Health Maintenance  Topic Date Due   Hepatitis  C Screening  Never done   COVID-19 Vaccine (10 - 2024-25 season) 07/02/2023   Medicare Annual Wellness (AWV)  07/26/2024   DTaP/Tdap/Td (7 - Td or Tdap) 12/29/2026   Colonoscopy  08/16/2031   Pneumonia Vaccine 66+ Years old  Completed   INFLUENZA VACCINE  Completed   HPV VACCINES  Aged Out   Zoster Vaccines- Shingrix  Discontinued    Health Maintenance  Health Maintenance Due  Topic Date Due   Hepatitis C Screening  Never done   COVID-19 Vaccine (10 - 2024-25 season) 07/02/2023    Colorectal cancer screening: Type of screening: Colonoscopy. Completed 08/15/2021. Repeat every 10 years  Lung Cancer Screening: (Low Dose CT Chest recommended if Age 108-80 years, 20 pack-year currently smoking OR have quit w/in 15years.) does not qualify.   Lung Cancer Screening Referral: no  Additional Screening:  Hepatitis C Screening: does qualify;   Vision Screening: Recommended annual  ophthalmology exams for early detection of glaucoma and other disorders of the eye. Is the patient up to date with their annual eye exam?  Yes  Who is the provider or what is the name of the office in which the patient attends annual eye exams? [facility] If pt is not established with a provider, would they like to be referred to a provider to establish care? No .   Dental Screening: Recommended annual dental exams for proper oral hygiene  Diabetic Foot Exam: n/a  Community Resource Referral / Chronic Care Management: CRR required this visit?  No   CCM required this visit?  No     Plan:     I have personally reviewed and noted the following in the patient's chart:   Medical and social history Use of alcohol , tobacco or illicit drugs  Current medications and supplements including opioid prescriptions. Patient is not currently taking opioid prescriptions. Functional ability and status Nutritional status Physical activity Advanced directives List of other physicians Hospitalizations, surgeries, and  ER visits in previous 12 months Vitals Screenings to include cognitive, depression, and falls Referrals and appointments  In addition, I have reviewed and discussed with patient certain preventive protocols, quality metrics, and best practice recommendations. A written personalized care plan for preventive services as well as general preventive health recommendations were provided to patient.     Ardella FORBES Dawn, LPN   8/89/7974   After Visit Summary: (MyChart) Due to this being a telephonic visit, the after visit summary with patients personalized plan was offered to patient via MyChart   Nurse Notes: none  Medical screening examination/treatment/procedure(s) were performed by non-physician practitioner and as supervising physician I was immediately available for consultation/collaboration.  I agree with above. Karlynn Noel, MD

## 2023-09-01 ENCOUNTER — Other Ambulatory Visit: Payer: Self-pay | Admitting: Internal Medicine

## 2023-09-10 ENCOUNTER — Encounter: Payer: Self-pay | Admitting: Internal Medicine

## 2023-09-11 ENCOUNTER — Other Ambulatory Visit: Payer: Self-pay | Admitting: Internal Medicine

## 2023-09-11 MED ORDER — METHOCARBAMOL 750 MG PO TABS
750.0000 mg | ORAL_TABLET | Freq: Three times a day (TID) | ORAL | 2 refills | Status: DC | PRN
Start: 2023-09-11 — End: 2023-12-25

## 2023-09-13 MED ORDER — HYDROCODONE-ACETAMINOPHEN 7.5-325 MG PO TABS: 1.0000 | ORAL_TABLET | Freq: Four times a day (QID) | ORAL | 0 refills | Status: DC | PRN

## 2023-09-14 ENCOUNTER — Other Ambulatory Visit: Payer: Self-pay | Admitting: Internal Medicine

## 2023-09-14 MED ORDER — RAMELTEON 8 MG PO TABS: 8.0000 mg | ORAL_TABLET | Freq: Every day | ORAL | 1 refills | Status: DC

## 2023-10-08 ENCOUNTER — Other Ambulatory Visit: Payer: Self-pay | Admitting: Internal Medicine

## 2023-10-12 ENCOUNTER — Telehealth: Payer: Self-pay | Admitting: Internal Medicine

## 2023-10-12 ENCOUNTER — Other Ambulatory Visit: Payer: Self-pay | Admitting: Internal Medicine

## 2023-10-12 NOTE — Telephone Encounter (Signed)
 Copied from CRM [id number]. Topic: Clinical - Prescription Issue >> Oct 12, 2023  9:43 AM Drema Balzarine wrote: Reason for CRM: [facility] confirm the quantity of testosterone medication? Wants to know if they can dispense 5ml or more? . Please call [phone]

## 2023-10-16 NOTE — Telephone Encounter (Signed)
 Copied from CRM [id number]. Topic: Clinical - Prescription Issue >> Oct 16, 2023  9:48 AM Adaysia C wrote: Reason for CRM: Patients pharmacy([facility]) called to ask if the provider would be able to change the patients prescription for testosterone enanthate (DELATESTRYL)  to per injection because this medication comes in a pack; please follow up with patients pharmacy for an answer or any questions #[phone](ask to speak with registered pharmacist)

## 2023-10-18 MED ORDER — HYDROCODONE-ACETAMINOPHEN 7.5-325 MG PO TABS: 1.0000 | ORAL_TABLET | Freq: Four times a day (QID) | ORAL | 0 refills | Status: DC | PRN

## 2023-10-19 NOTE — Telephone Encounter (Signed)
 Copied from CRM [id number]. Topic: Clinical - Prescription Issue >> Oct 19, 2023  2:13 PM Sonny Dandy B wrote: Reason for CRM: Pharmacy called with [facility] for medication testosterone enanthate (DELATESTRYL) 200 MG/ML injection please call [phone] states direction and quantity is a miss match

## 2023-10-22 ENCOUNTER — Encounter: Payer: Self-pay | Admitting: Internal Medicine

## 2023-10-23 MED ORDER — TESTOSTERONE ENANTHATE 200 MG/ML IM SOLN
200.0000 mg | INTRAMUSCULAR | 1 refills | Status: DC
Start: 2023-10-23 — End: 2023-11-30

## 2023-10-23 NOTE — Telephone Encounter (Signed)
 It is 1 mL per injection, not 5 mL per inj.  Thanks

## 2023-11-01 ENCOUNTER — Encounter: Payer: Self-pay | Admitting: Internal Medicine

## 2023-11-01 NOTE — Telephone Encounter (Signed)
 Copied from CRM [id number]. Topic: General - Other >> Nov 01, 2023 10:52 AM Adonis Hoot wrote: Reason for CRM: Patient called in stating that Gilberto Labella sent over a surgical clearance form  two weeks ago for him to have knee replacement ,and they haven't received the completed form back just yet.Patient would like to know the status of this form. Fax number:[phone]

## 2023-11-05 ENCOUNTER — Telehealth: Payer: Self-pay | Admitting: Internal Medicine

## 2023-11-05 ENCOUNTER — Encounter: Payer: Self-pay | Admitting: Internal Medicine

## 2023-11-05 NOTE — Telephone Encounter (Signed)
 Copied from CRM [id number]. Topic: Clinical - Prescription Issue >> Nov 02, 2023  2:25 PM Earnestine Goes B wrote: Reason for CRM: Pharmacy called to speak with provider regarding medication testosterone  enanthate (DELATESTRYL ) 200 MG/ML injection , request to decrease the quantity to 5 per size, please call [phone]

## 2023-11-09 NOTE — Telephone Encounter (Signed)
 Copied from CRM [id number]. Topic: Clinical - Prescription Issue >> Nov 02, 2023  2:25 PM Earnestine Goes B wrote: Reason for CRM: Pharmacy called to speak with provider regarding medication testosterone  enanthate (DELATESTRYL ) 200 MG/ML injection , request to decrease the quantity to 5 per size, please call [phone] >> Nov 09, 2023 12:46 PM Kita Perish H wrote: [facility] following up on previous message sent regarding patients testosterone  enanthate (DELATESTRYL ) 200 MG/ML injection.  [facility] [phone]

## 2023-11-09 NOTE — Telephone Encounter (Signed)
 Copied from CRM [id number]. Topic: General - Other >> Nov 09, 2023 10:11 AM Everlene Hobby D wrote: [facility] needs a fax saying the patient can get his knee replacement it's been a month and nothing has been sent

## 2023-11-09 NOTE — Telephone Encounter (Signed)
 Called and spoke with pharmacist. They informed me that they could not dispense as written due to the medication coming in groups of 5 (was written for 6). Informed them it was fine to switch to 5 with 1 refill

## 2023-11-13 ENCOUNTER — Ambulatory Visit: Payer: Medicare Other | Admitting: Internal Medicine

## 2023-11-19 ENCOUNTER — Other Ambulatory Visit: Payer: Self-pay | Admitting: Internal Medicine

## 2023-11-19 MED ORDER — TADALAFIL 5 MG PO TABS: ORAL_TABLET | ORAL | 3 refills | Status: DC

## 2023-11-27 ENCOUNTER — Ambulatory Visit: Admitting: Internal Medicine

## 2023-11-30 ENCOUNTER — Ambulatory Visit: Admitting: Internal Medicine

## 2023-11-30 ENCOUNTER — Encounter: Payer: Self-pay | Admitting: Internal Medicine

## 2023-11-30 VITALS — BP 138/74 | HR 63 | Ht 70.0 in | Wt 140.2 lb

## 2023-11-30 DIAGNOSIS — D751 Secondary polycythemia: Secondary | ICD-10-CM | POA: Diagnosis not present

## 2023-11-30 DIAGNOSIS — E291 Testicular hypofunction: Secondary | ICD-10-CM | POA: Diagnosis not present

## 2023-11-30 DIAGNOSIS — M179 Osteoarthritis of knee, unspecified: Secondary | ICD-10-CM | POA: Insufficient documentation

## 2023-11-30 DIAGNOSIS — M5416 Radiculopathy, lumbar region: Secondary | ICD-10-CM | POA: Diagnosis not present

## 2023-11-30 DIAGNOSIS — M170 Bilateral primary osteoarthritis of knee: Secondary | ICD-10-CM

## 2023-11-30 DIAGNOSIS — Z21 Asymptomatic human immunodeficiency virus [HIV] infection status: Secondary | ICD-10-CM | POA: Diagnosis not present

## 2023-11-30 MED ORDER — TESTOSTERONE ENANTHATE 200 MG/ML IM SOLN: 200.0000 mg | INTRAMUSCULAR | 1 refills | Status: DC

## 2023-11-30 MED ORDER — POTASSIUM CHLORIDE CRYS ER 10 MEQ PO TBCR: 10.0000 meq | EXTENDED_RELEASE_TABLET | Freq: Every day | ORAL | 3 refills | Status: AC

## 2023-11-30 MED ORDER — HYDROCODONE-ACETAMINOPHEN 7.5-325 MG PO TABS: 1.0000 | ORAL_TABLET | Freq: Four times a day (QID) | ORAL | 0 refills | Status: DC | PRN

## 2023-11-30 MED ORDER — RAMELTEON 8 MG PO TABS: 8.0000 mg | ORAL_TABLET | Freq: Every day | ORAL | 1 refills | Status: DC

## 2023-11-30 NOTE — Assessment & Plan Note (Signed)
 L TKR is planned for 12/25/2023

## 2023-11-30 NOTE — Assessment & Plan Note (Signed)
 No recent steroid injections

## 2023-11-30 NOTE — Assessment & Plan Note (Signed)
Continues to do well. Will be following up with Dr. Allen Norris ?

## 2023-11-30 NOTE — Progress Notes (Signed)
 Subjective:  Patient ID: Troy Marsh, male    DOB: 23-Oct-1950  Age: 73 y.o. MRN: [id number]  CC: Medical Management of Chronic Issues (6 Month follow up. Refill on meds (including controled))   HPI Troy Marsh presents for OA, ED, dyslipidemia L TKR is planned for 12/25/2023   Outpatient Medications Prior to Visit  Medication Sig Dispense Refill   alendronate (FOSAMAX) 70 MG tablet Take 70 mg by mouth once a week. Take with a full glass of water  on an empty stomach. 1 tab per week     AMBULATORY NON FORMULARY MEDICATION Medication Name: diltiazem  cream 2% gel apply 2 times a day x 4 weeks 30 g 6   aspirin 81 MG tablet Take 81 mg by mouth daily.     calcium  carbonate (TUMS - DOSED IN MG ELEMENTAL CALCIUM ) 500 MG chewable tablet Chew 500 mg by mouth daily as needed for indigestion or heartburn.     CAVERJECT  IMPULSE 20 MCG injection INJECT 20 MCG BY INTRACAVITARY  ROUTE AS DIRECTED AS NEEDED FOR  ERECTILE DYSFUNCTION USE NO MORE THAN 3 TIMES WEEKLY 9 each 3   cetirizine-pseudoephedrine (ZYRTEC-D) 5-120 MG tablet Take 1 tablet by mouth daily as needed for allergies.      cyclobenzaprine  (FLEXERIL ) 5 MG tablet Take 5-10 mg by mouth at bedtime as needed for muscle spasms.     diltiazem  2 % GEL Diltiazem  gel 2% apply rectally twice daily for 4 weeks (Patient taking differently: Apply 1 Application topically at bedtime.) 30 g 6   emtricitabine -rilpivir-tenofovir  AF (ODEFSEY ) 200-25-25 MG TABS tablet Take 1 tablet by mouth daily.     gabapentin  (NEURONTIN ) 300 MG capsule Take 300 mg by mouth 3 (three) times daily.     hydrochlorothiazide  (HYDRODIURIL ) 50 MG tablet TAKE 1 TABLET BY MOUTH DAILY 90 tablet 3   hydrocortisone  (ANUSOL -HC) 25 MG suppository Place 1 suppository (25 mg total) rectally 2 (two) times daily. 180 suppository 1   LINZESS  290 MCG CAPS capsule TAKE 1 CAPSULE BY MOUTH DAILY  BEFORE BREAKFAST 90 capsule 4   Melatonin 10 MG TABS Take 10 mg by mouth at bedtime.      methocarbamol  (ROBAXIN -750) 750 MG tablet Take 1 tablet (750 mg total) by mouth every 8 (eight) hours as needed for muscle spasms. 90 tablet 2   montelukast  (SINGULAIR ) 10 MG tablet TAKE 1 TABLET BY MOUTH DAILY 90 tablet 3   Multiple Vitamin (MULTIVITAMIN WITH MINERALS) TABS tablet Take 1 tablet by mouth daily. COMPLETE MULTIVITAMIN MEN'S 50+     polyethylene glycol powder (GLYCOLAX /MIRALAX ) powder Take 17 g by mouth at bedtime.     RESTASIS  0.05 % ophthalmic emulsion Place 1 drop into both eyes 2 (two) times daily as needed (dry eyes).     tadalafil  (CIALIS ) 5 MG tablet TAKE 1 TABLET EVERY DAY 90 tablet 3   HYDROcodone -acetaminophen  (NORCO) 7.5-325 MG tablet Take 1 tablet by mouth every 6 (six) hours as needed for severe pain (pain score 7-10). 120 tablet 0   potassium chloride  (KLOR-CON  M) 10 MEQ tablet TAKE 1 TABLET BY MOUTH DAILY 90 tablet 3   ramelteon  (ROZEREM ) 8 MG tablet Take 1 tablet (8 mg total) by mouth at bedtime. 90 tablet 1   testosterone  enanthate (DELATESTRYL ) 200 MG/ML injection Inject 1 mL (200 mg total) into the muscle every 14 (fourteen) days. For IM use only 6 mL 1   atorvastatin  (LIPITOR) 40 MG tablet Take 40 mg by mouth at bedtime.  Abaloparatide (TYMLOS [place]) Inject 80 mg into the skin daily. INJECT ONCE A DAY 80MG .     alprostadil  (MUSE ) 250 MCG pellet 1 each (250 mcg total) by Transurethral route as needed for erectile dysfunction. use no more than 3 times per week (Patient taking differently: 250 mcg by Transurethral route as needed for erectile dysfunction. use no more than 2 times per week) 6 each 5   Suvorexant  (BELSOMRA ) 10 MG TABS TAKE 1 TABLET BY MOUTH AT  BEDTIME AS NEEDED (Patient taking differently: Take 1 tablet by mouth at bedtime.) 90 tablet 1   No facility-administered medications prior to visit.    ROS: Review of Systems  Constitutional:  Negative for appetite change, fatigue and unexpected weight change.  HENT:  Negative for congestion, nosebleeds,  sneezing, sore throat and trouble swallowing.   Eyes:  Negative for itching and visual disturbance.  Respiratory:  Negative for cough.   Cardiovascular:  Negative for chest pain, palpitations and leg swelling.  Gastrointestinal:  Negative for abdominal distention, blood in stool, diarrhea and nausea.  Genitourinary:  Negative for frequency and hematuria.  Musculoskeletal:  Positive for arthralgias and gait problem. Negative for back pain, joint swelling and neck pain.  Skin:  Negative for rash.  Neurological:  Negative for dizziness, tremors, speech difficulty and weakness.  Psychiatric/Behavioral:  Negative for agitation, dysphoric mood and sleep disturbance. The patient is not nervous/anxious.     Objective:  BP 138/74   Pulse 63   Ht 5\' 10"  (1.778 m)   Wt 140 lb 3.2 oz (63.6 kg)   SpO2 99%   BMI 20.12 kg/m   BP Readings from Last 3 Encounters:  11/30/23 138/74  07/27/23 138/76  05/15/23 130/80    Wt Readings from Last 3 Encounters:  11/30/23 140 lb 3.2 oz (63.6 kg)  07/27/23 139 lb 2 oz (63.1 kg)  05/15/23 133 lb (60.3 kg)    Physical Exam Constitutional:      General: He is not in acute distress.    Appearance: Normal appearance. He is well-developed.     Comments: NAD  Eyes:     Conjunctiva/sclera: Conjunctivae normal.     Pupils: Pupils are equal, round, and reactive to light.  Neck:     Thyroid : No thyromegaly.     Vascular: No JVD.  Cardiovascular:     Rate and Rhythm: Normal rate and regular rhythm.     Heart sounds: Normal heart sounds. No murmur heard.    No friction rub. No gallop.  Pulmonary:     Effort: Pulmonary effort is normal. No respiratory distress.     Breath sounds: Normal breath sounds. No wheezing or rales.  Chest:     Chest wall: No tenderness.  Abdominal:     General: Bowel sounds are normal. There is no distension.     Palpations: Abdomen is soft. There is no mass.     Tenderness: There is no abdominal tenderness. There is no guarding  or rebound.  Musculoskeletal:        General: Tenderness present. Normal range of motion.     Cervical back: Normal range of motion.     Right lower leg: No edema.     Left lower leg: No edema.  Lymphadenopathy:     Cervical: No cervical adenopathy.  Skin:    General: Skin is warm and dry.     Findings: No rash.  Neurological:     Mental Status: He is alert and oriented to person, place, and  time.     Cranial Nerves: No cranial nerve deficit.     Motor: No abnormal muscle tone.     Coordination: Coordination normal.     Gait: Gait normal.     Deep Tendon Reflexes: Reflexes are normal and symmetric.  Psychiatric:        Behavior: Behavior normal.        Thought Content: Thought content normal.        Judgment: Judgment normal.   L knee w/pain on ROM  Lab Results  Component Value Date   WBC 9.3 05/15/2023   HGB 16.3 05/15/2023   HCT 49.2 05/15/2023   PLT 205.0 05/15/2023   GLUCOSE 105 (H) 05/15/2023   CHOL 125 11/20/2022   TRIG 46.0 11/20/2022   HDL 64.80 11/20/2022   LDLDIRECT 113.3 05/21/2006   LDLCALC 51 11/20/2022   ALT 13 05/15/2023   AST 20 05/15/2023   NA 132 (L) 05/15/2023   K 4.0 05/15/2023   CL 94 (L) 05/15/2023   CREATININE 0.99 05/15/2023   BUN 26 (H) 05/15/2023   CO2 30 05/15/2023   TSH 3.14 11/20/2022   PSA 1.39 11/20/2022   INR 1.0 07/06/2020    DG Lumbar Spine 2-3 Views Result Date: 02/15/2023 CLINICAL DATA:  L3 through L5 posterior decompression and fusion. EXAM: LUMBAR SPINE - 2-3 VIEW COMPARISON:  Lumbar spine MRI 07/04/2022 and intraoperative images 1 day prior FINDINGS: Two C-arm fluoroscopic images were obtained intraoperatively and submitted for post operative interpretation. The lowest formed disc space is designated L5-S1 which is in keeping with the prior lumbar spine MRI report. The patient is status post posterior instrumented fusion with interbody spacer placement at L3 through L5. Hardware alignment is grossly within expected limits,  without evidence of complication. Compression deformity of the L4 vertebral body is unchanged. Fluoro time 3 minutes 20 seconds, dose 75.8 mGy. Please see the performing provider's procedural report for further detail. IMPRESSION: Intraoperative images during L3 through L5 posterior instrumented fusion and decompression as above. Electronically Signed   By: Eldora Greet M.D.   On: 02/15/2023 13:00   DG C-Arm 1-60 Min-No Report Result Date: 02/15/2023 Fluoroscopy was utilized by the requesting physician.  No radiographic interpretation.   DG C-Arm 1-60 Min-No Report Result Date: 02/15/2023 Fluoroscopy was utilized by the requesting physician.  No radiographic interpretation.   DG Lumbar Spine 2-3 Views Result Date: 02/14/2023 CLINICAL DATA:  Elective surgery. EXAM: LUMBAR SPINE - 2-3 VIEW COMPARISON:  None Available. FINDINGS: Two coned fluoroscopic spot views of the lumbar spine obtained in the operating room. Interbody spacers at 2 contiguous levels, presumably at L3-L4 and L4-L5. Fluoroscopy time 252.6 seconds, dose 112.21 mGy. IMPRESSION: Intraoperative fluoroscopy during lumbar spine surgery. Electronically Signed   By: Chadwick Colonel M.D.   On: 02/14/2023 12:29   DG C-Arm 1-60 Min-No Report Result Date: 02/14/2023 Fluoroscopy was utilized by the requesting physician.  No radiographic interpretation.   DG C-Arm 1-60 Min-No Report Result Date: 02/14/2023 Fluoroscopy was utilized by the requesting physician.  No radiographic interpretation.   DG C-Arm 1-60 Min-No Report Result Date: 02/14/2023 Fluoroscopy was utilized by the requesting physician.  No radiographic interpretation.    Assessment & Plan:   Problem List Items Addressed This Visit     Hypogonadism male - Primary   Testosterone  replacment q 2 weeks IM and using caverject   Potential benefits of a long term testosterone  use as well as potential risks  and complications were explained to the patient and were  aknowledged. Testosterone  levels are low however it is working clinically - no dose change      Relevant Orders   CBC with Differential/Platelet   Comprehensive metabolic panel with GFR   Testosterone    PSA   HIV (human immunodeficiency virus infection) (HCC)   Continues to do well. Will be following up with Dr. Ole Berkeley      Relevant Orders   CBC with Differential/Platelet   Comprehensive metabolic panel with GFR   Testosterone    TSH   PSA   Lipid panel   Urinalysis   Polycythemia   Pre-op labs      Relevant Orders   CBC with Differential/Platelet   Radiculopathy, lumbar region   No recent steroid injections      Relevant Medications   ramelteon  (ROZEREM ) 8 MG tablet   Knee osteoarthritis   L TKR is planned for 12/25/2023      Relevant Medications   HYDROcodone -acetaminophen  (NORCO) 7.5-325 MG tablet      Meds ordered this encounter  Medications   potassium chloride  (KLOR-CON  M) 10 MEQ tablet    Sig: Take 1 tablet (10 mEq total) by mouth daily.    Dispense:  90 tablet    Refill:  3    Please send a replace/new response with 90-Day Supply if appropriate to maximize member benefit. Requesting 1 year supply.   ramelteon  (ROZEREM ) 8 MG tablet    Sig: Take 1 tablet (8 mg total) by mouth at bedtime.    Dispense:  90 tablet    Refill:  1   testosterone  enanthate (DELATESTRYL ) 200 MG/ML injection    Sig: Inject 1 mL (200 mg total) into the muscle every 14 (fourteen) days. For IM use only    Dispense:  6 mL    Refill:  1   HYDROcodone -acetaminophen  (NORCO) 7.5-325 MG tablet    Sig: Take 1 tablet by mouth every 6 (six) hours as needed for severe pain (pain score 7-10).    Dispense:  120 tablet    Refill:  0      Follow-up: Return in about 6 months (around 06/01/2024) for Wellness Exam.  Anitra Barn, MD

## 2023-11-30 NOTE — Assessment & Plan Note (Signed)
 Preop labs

## 2023-11-30 NOTE — Assessment & Plan Note (Signed)
Testosterone replacment q 2 weeks IM and using caverject ? Potential benefits of a long term testosterone use as well as potential risks  and complications were explained to the patient and were aknowledged. ?Testosterone levels are low however it is working clinically - no dose change ?

## 2023-12-06 ENCOUNTER — Telehealth: Payer: Self-pay | Admitting: Internal Medicine

## 2023-12-06 NOTE — Telephone Encounter (Signed)
 Copied from CRM [id number]. Topic: Clinical - Prescription Issue >> Dec 06, 2023  4:13 PM Troy Marsh wrote: Reason for CRM: [facility] advise the testosterone  enanthate (DELATESTRYL ) 200 MG/ML injection only comes in 5 ML Vials -

## 2023-12-07 MED ORDER — TESTOSTERONE ENANTHATE 200 MG/ML IM SOLN: 200.0000 mg | INTRAMUSCULAR | 1 refills | Status: DC

## 2023-12-07 NOTE — Telephone Encounter (Signed)
Corrected. Thanks

## 2023-12-12 NOTE — Patient Instructions (Signed)
 SURGICAL WAITING ROOM VISITATION  Patients having surgery or a procedure may have no more than 2 support people in the waiting area - these visitors may rotate.    Children under the age of 110 must have an adult with them who is not the patient.  Due to an increase in RSV and influenza rates and associated hospitalizations, children ages 67 and under may not visit patients in [facility] hospitals.  Visitors with respiratory illnesses are discouraged from visiting and should remain at home.  If the patient needs to stay at the hospital during part of their recovery, the visitor guidelines for inpatient rooms apply. Pre-op nurse will coordinate an appropriate time for 1 support person to accompany patient in pre-op.  This support person may not rotate.    Please refer to the [facility] website for the visitor guidelines for Inpatients (after your surgery is over and you are in a regular room).       Your procedure is scheduled on: 12/25/23   Report to [facility] Main Entrance    Report to admitting at : 10:30 AM   Call this number if you have problems the morning of surgery [phone]   Do not eat food :After Midnight.   After Midnight you may have the following liquids until : 10:00 AM DAY OF SURGERY  Water  Non-Citrus Juices (without pulp, NO RED-Apple, White grape, White cranberry) Black Coffee (NO MILK/CREAM OR CREAMERS, sugar ok)  Clear Tea (NO MILK/CREAM OR CREAMERS, sugar ok) regular and decaf                             Plain Jell-O (NO RED)                                           Fruit ices (not with fruit pulp, NO RED)                                     Popsicles (NO RED)                                                               Sports drinks like Gatorade (NO RED)   The day of surgery:  Drink ONE (1) Pre-Surgery Clear Ensure at : 10:00 AM the morning of surgery. Drink in one sitting. Do not sip.  This drink was given to you during your hospital   pre-op appointment visit. Nothing else to drink after completing the  Pre-Surgery Clear Ensure or G2.          If you have questions, please contact your surgeon's office.  FOLLOW ANY ADDITIONAL PRE OP INSTRUCTIONS YOU RECEIVED FROM YOUR SURGEON'S OFFICE!!!   Oral Hygiene is also important to reduce your risk of infection.                                    Remember - BRUSH YOUR TEETH THE MORNING OF SURGERY WITH YOUR REGULAR TOOTHPASTE  DENTURES WILL BE REMOVED PRIOR TO SURGERY PLEASE DO NOT APPLY "Poly grip" OR ADHESIVES!!!   Do NOT smoke after Midnight   Stop all vitamins and herbal supplements 7 days before surgery.   Take these medicines the morning of surgery with A SIP OF WATER : montelukast ,gabapentin ,emtricitabine . Tylenol ,cetirizine as needed.Eye drops as usual.                    You may not have any metal on your body including hair pins, jewelry, and body piercing             Do not wear lotions, powders, perfumes/cologne, or deodorant              Men may shave face and neck.   Do not bring valuables to the hospital. [facility] IS NOT             RESPONSIBLE   FOR VALUABLES.   Contacts, glasses, dentures or bridgework may not be worn into surgery.   Bring small overnight bag day of surgery.   DO NOT BRING YOUR HOME MEDICATIONS TO THE HOSPITAL. PHARMACY WILL DISPENSE MEDICATIONS LISTED ON YOUR MEDICATION LIST TO YOU DURING YOUR ADMISSION IN THE HOSPITAL!    Patients discharged on the day of surgery will not be allowed to drive home.  Someone NEEDS to stay with you for the first 24 hours after anesthesia.   Special Instructions: Bring a copy of your healthcare power of attorney and living will documents the day of surgery if you haven't scanned them before.              Please read over the following fact sheets you were given: IF YOU HAVE QUESTIONS ABOUT YOUR PRE-OP INSTRUCTIONS PLEASE CALL [phone]   If you received a COVID test during your pre-op visit   it is requested that you wear a mask when out in public, stay away from anyone that may not be feeling well and notify your surgeon if you develop symptoms. If you test positive for Covid or have been in contact with anyone that has tested positive in the last 10 days please notify you surgeon.      Pre-operative 5 CHG Bath Instructions   You can play a key role in reducing the risk of infection after surgery. Your skin needs to be as free of germs as possible. You can reduce the number of germs on your skin by washing with CHG (chlorhexidine  gluconate) soap before surgery. CHG is an antiseptic soap that kills germs and continues to kill germs even after washing.   DO NOT use if you have an allergy to chlorhexidine /CHG or antibacterial soaps. If your skin becomes reddened or irritated, stop using the CHG and notify one of our RNs at [phone].   Please shower with the CHG soap starting 4 days before surgery using the following schedule:     Please keep in mind the following:  DO NOT shave, including legs and underarms, starting the day of your first shower.   You may shave your face at any point before/day of surgery.  Place clean sheets on your bed the day you start using CHG soap. Use a clean washcloth (not used since being washed) for each shower. DO NOT sleep with pets once you start using the CHG.   CHG Shower Instructions:  If you choose to wash your hair and private area, wash first with your normal shampoo/soap.  After you use shampoo/soap, rinse your hair and  body thoroughly to remove shampoo/soap residue.  Turn the water  OFF and apply about 3 tablespoons (45 ml) of CHG soap to a CLEAN washcloth.  Apply CHG soap ONLY FROM YOUR NECK DOWN TO YOUR TOES (washing for 3-5 minutes)  DO NOT use CHG soap on face, private areas, open wounds, or sores.  Pay special attention to the area where your surgery is being performed.  If you are having back surgery, having someone wash your back  for you may be helpful. Wait 2 minutes after CHG soap is applied, then you may rinse off the CHG soap.  Pat dry with a clean towel  Put on clean clothes/pajamas   If you choose to wear lotion, please use ONLY the CHG-compatible lotions on the back of this paper.     Additional instructions for the day of surgery: DO NOT APPLY any lotions, deodorants, cologne, or perfumes.   Put on clean/comfortable clothes.  Brush your teeth.  Ask your nurse before applying any prescription medications to the skin.   CHG Compatible Lotions   Aveeno Moisturizing lotion  Cetaphil Moisturizing Cream  Cetaphil Moisturizing Lotion  Clairol Herbal Essence Moisturizing Lotion, Dry Skin  Clairol Herbal Essence Moisturizing Lotion, Extra Dry Skin  Clairol Herbal Essence Moisturizing Lotion, Normal Skin  Curel Age Defying Therapeutic Moisturizing Lotion with Alpha Hydroxy  Curel Extreme Care Body Lotion  Curel Soothing Hands Moisturizing Hand Lotion  Curel Therapeutic Moisturizing Cream, Fragrance-Free  Curel Therapeutic Moisturizing Lotion, Fragrance-Free  Curel Therapeutic Moisturizing Lotion, Original Formula  Eucerin Daily Replenishing Lotion  Eucerin Dry Skin Therapy Plus Alpha Hydroxy Crme  Eucerin Dry Skin Therapy Plus Alpha Hydroxy Lotion  Eucerin Original Crme  Eucerin Original Lotion  Eucerin Plus Crme Eucerin Plus Lotion  Eucerin TriLipid Replenishing Lotion  Keri Anti-Bacterial Hand Lotion  Keri Deep Conditioning Original Lotion Dry Skin Formula Softly Scented  Keri Deep Conditioning Original Lotion, Fragrance Free Sensitive Skin Formula  Keri Lotion Fast Absorbing Fragrance Free Sensitive Skin Formula  Keri Lotion Fast Absorbing Softly Scented Dry Skin Formula  Keri Original Lotion  Keri Skin Renewal Lotion Keri Silky Smooth Lotion  Keri Silky Smooth Sensitive Skin Lotion  Nivea Body Creamy Conditioning Oil  Nivea Body Extra Enriched Lotion  Nivea Body Original Lotion  Nivea Body  Sheer Moisturizing Lotion Nivea Crme  Nivea Skin Firming Lotion  NutraDerm 30 Skin Lotion  NutraDerm Skin Lotion  NutraDerm Therapeutic Skin Cream  NutraDerm Therapeutic Skin Lotion  ProShield Protective Hand Cream  Provon moisturizing lotion   Incentive Spirometer  An incentive spirometer is a tool that can help keep your lungs clear and active. This tool measures how well you are filling your lungs with each breath. Taking long deep breaths may help reverse or decrease the chance of developing breathing (pulmonary) problems (especially infection) following: A long period of time when you are unable to move or be active. BEFORE THE PROCEDURE  If the spirometer includes an indicator to show your best effort, your nurse or respiratory therapist will set it to a desired goal. If possible, sit up straight or lean slightly forward. Try not to slouch. Hold the incentive spirometer in an upright position. INSTRUCTIONS FOR USE  Sit on the edge of your bed if possible, or sit up as far as you can in bed or on a chair. Hold the incentive spirometer in an upright position. Breathe out normally. Place the mouthpiece in your mouth and seal your lips tightly around it. Breathe in slowly and as  deeply as possible, raising the piston or the ball toward the top of the column. Hold your breath for 3-5 seconds or for as long as possible. Allow the piston or ball to fall to the bottom of the column. Remove the mouthpiece from your mouth and breathe out normally. Rest for a few seconds and repeat Steps 1 through 7 at least 10 times every 1-2 hours when you are awake. Take your time and take a few normal breaths between deep breaths. The spirometer may include an indicator to show your best effort. Use the indicator as a goal to work toward during each repetition. After each set of 10 deep breaths, practice coughing to be sure your lungs are clear. If you have an incision (the cut made at the time of  surgery), support your incision when coughing by placing a pillow or rolled up towels firmly against it. Once you are able to get out of bed, walk around indoors and cough well. You may stop using the incentive spirometer when instructed by your caregiver.  RISKS AND COMPLICATIONS Take your time so you do not get dizzy or light-headed. If you are in pain, you may need to take or ask for pain medication before doing incentive spirometry. It is harder to take a deep breath if you are having pain. AFTER USE Rest and breathe slowly and easily. It can be helpful to keep track of a log of your progress. Your caregiver can provide you with a simple table to help with this. If you are using the spirometer at home, follow these instructions: SEEK MEDICAL CARE IF:  You are having difficultly using the spirometer. You have trouble using the spirometer as often as instructed. Your pain medication is not giving enough relief while using the spirometer. You develop fever of 100.5 F (38.1 C) or higher. SEEK IMMEDIATE MEDICAL CARE IF:  You cough up bloody sputum that had not been present before. You develop fever of 102 F (38.9 C) or greater. You develop worsening pain at or near the incision site. MAKE SURE YOU:  Understand these instructions. Will watch your condition. Will get help right away if you are not doing well or get worse. Document Released: 11/13/2006 Document Revised: 09/25/2011 Document Reviewed: 01/14/2007 [facility] Patient Information 2014 [place], [place].   ________________________________________________________________________

## 2023-12-13 ENCOUNTER — Other Ambulatory Visit: Payer: Self-pay

## 2023-12-13 ENCOUNTER — Encounter (HOSPITAL_COMMUNITY): Payer: Self-pay

## 2023-12-13 ENCOUNTER — Encounter (HOSPITAL_COMMUNITY)
Admission: RE | Admit: 2023-12-13 | Discharge: 2023-12-13 | Disposition: A | Discharge status: Home / Self Care | Source: Ambulatory Visit | Attending: Orthopedic Surgery | Admitting: Orthopedic Surgery

## 2023-12-13 VITALS — BP 134/85 | HR 70 | Temp 97.8°F | Ht 70.0 in | Wt 135.0 lb

## 2023-12-13 DIAGNOSIS — Z01818 Encounter for other preprocedural examination: Secondary | ICD-10-CM | POA: Insufficient documentation

## 2023-12-13 DIAGNOSIS — I10 Essential (primary) hypertension: Secondary | ICD-10-CM | POA: Insufficient documentation

## 2023-12-13 HISTORY — DX: Irritable bowel syndrome, unspecified: K58.9

## 2023-12-13 LAB — BASIC METABOLIC PANEL WITH GFR
Anion gap: 9 (ref 5–15)
BUN: 9 mg/dL (ref 8–23)
CO2: 27 mmol/L (ref 22–32)
Calcium: 8.8 mg/dL — ABNORMAL LOW (ref 8.9–10.3)
Chloride: 96 mmol/L — ABNORMAL LOW (ref 98–111)
Creatinine, Ser: 1.08 mg/dL (ref 0.61–1.24)
GFR, Estimated: >60 mL/min (ref >60)
Glucose, Bld: 132 mg/dL — ABNORMAL HIGH (ref 70–99)
Potassium: 3.4 mmol/L — ABNORMAL LOW (ref 3.5–5.1)
Sodium: 132 mmol/L — ABNORMAL LOW (ref 135–145)

## 2023-12-13 LAB — CBC
HCT: 50.3 % (ref 39.0–52.0)
Hemoglobin: 17.3 g/dL — ABNORMAL HIGH (ref 13.0–17.0)
MCH: 33.2 pg (ref 26.0–34.0)
MCHC: 34.4 g/dL (ref 30.0–36.0)
MCV: 96.5 fL (ref 80.0–100.0)
Platelets: 155 10*3/uL (ref 150–400)
RBC: 5.21 MIL/uL (ref 4.22–5.81)
RDW: 12.9 % (ref 11.5–15.5)
WBC: 8.7 10*3/uL (ref 4.0–10.5)
nRBC: 0 % (ref 0.0–0.2)

## 2023-12-13 NOTE — Progress Notes (Signed)
 For Anesthesia: PCP - Plotnikov, Oakley Bellman, MD . LOV: 11/30/23 Cardiologist -   Bowel Prep reminder:  Chest x-ray -  EKG - 12/13/23 Stress Test -  ECHO -  Cardiac Cath -  Pacemaker/ICD device last checked: Pacemaker orders received: Device Rep notified:  Spinal Cord Stimulator:N/A  Sleep Study - N/A CPAP -   Fasting Blood Sugar -N/A  Checks Blood Sugar _____ times a day Date and result of last Hgb A1c-  Last dose of GLP1 agonist- N/A GLP1 instructions:   Last dose of SGLT-2 inhibitors- N/A SGLT-2 instructions:   Blood Thinner Instructions: Aspirin Instructions: To hold it 7 days before surgery. Last Dose:  Activity level: Can go up a flight of stairs and activities of daily living without stopping and without chest pain and/or shortness of breath   Able to exercise without chest pain and/or shortness of breath  Anesthesia review: Hx: HTN,HIV.  Patient denies shortness of breath, fever, cough and chest pain at PAT appointment   Patient verbalized understanding of instructions that were given to them at the PAT appointment. Patient was also instructed that they will need to review over the PAT instructions again at home before surgery.

## 2023-12-13 NOTE — Progress Notes (Signed)
 Lab. Results: Hemoglobin: 17.3

## 2023-12-14 LAB — SURGICAL PCR SCREEN
MRSA, PCR: POSITIVE — AB
Staphylococcus aureus: POSITIVE — AB

## 2023-12-14 NOTE — Progress Notes (Signed)
PCR: + MRSA./+ STAPH 

## 2023-12-17 NOTE — H&P (Signed)
 KNEE ARTHROPLASTY ADMISSION H&P  Patient ID: Troy Marsh MRN: [id number] DOB/AGE: 1951/02/26 73 y.o.  Chief Complaint: left knee pain.  Planned Procedure Date: 12/25/23 Medical Clearance by Dr. Georgia Kipper   Additional clearance by Dr. Ole Berkeley (ID)   HPI: Troy Marsh is a 74 y.o. male who presents for evaluation of OA LEFT KNEE. The patient has a history of pain and functional disability in the left knee due to arthritis and has failed non-surgical conservative treatments for greater than 12 weeks to include NSAID's and/or analgesics, corticosteriod injections, viscosupplementation injections, and activity modification.  Onset of symptoms was gradual, starting 5 years ago with gradually worsening course since that time. The patient noted prior procedures on the knee to include  arthroscopy and menisectomy on the left knee.  Patient currently rates pain at 3 out of 10 with activity. Patient has worsening of pain with activity and weight bearing and pain that interferes with activities of daily living.  Patient has evidence of joint space narrowing by imaging studies.  There is no active infection.  Past Medical History:  Diagnosis Date   Arthritis    BPH (benign prostatic hyperplasia)    [facility]  DR. Samantha Cress   Cancer [facility])    Skin cancer- basal and squamous   Closed fracture of nasal bones 06/02/2022   Constipation due to pain medication    HIV disease (HCC) 1985   last CD4 10/02/12 was 277   Hyperlipidemia    Hypertension    Hypogonadism male    IBS (irritable bowel syndrome)    Past Surgical History:  Procedure Laterality Date   ANTERIOR CERVICAL DECOMP/DISCECTOMY FUSION Bilateral 11/22/2017   Procedure: ANTERIOR CERVICAL DECOMPRESSION FUSION CERVICAL 4-5, CERVICAL 5-6, CERVICAL 6-7 WITH INSTRUMENTATION AND ALLOGRAFT TIME      /   REQUESTED 4 HOURS;  Surgeon: Virl Grimes, MD;  Location: [facility] OR;  Service: Orthopedics;  Laterality: Bilateral;   ANTERIOR LAT LUMBAR  FUSION Left 02/14/2023   Procedure: LEFT SIDED LUMBAR THREE - LUMBAR FOUR, LUMBAR FOUR - LUMBAR FIVE LATERAL INTERBODY FUSION WITH INSTRUMENTATION AND ALLOGRAFT;  Surgeon: Virl Grimes, MD;  Location: [facility] OR;  Service: Orthopedics;  Laterality: Left;   BACK SURGERY     COLONOSCOPY W/ POLYPECTOMY     EYE SURGERY Bilateral    Lasik, Cataract   HEMORRHOID SURGERY  1973   INGUINAL HERNIA REPAIR Right 06/11/2018   Procedure: LAPAROSCOPIC RIGHT INGUINAL HERNIA WITH MESH;  Surgeon: Shela Derby, MD;  Location: [facility] OR;  Service: General;  Laterality: Right;   INSERTION OF MESH Right 06/11/2018   Procedure: INSERTION OF MESH;  Surgeon: Shela Derby, MD;  Location: [facility] OR;  Service: General;  Laterality: Right;   KNEE ARTHROSCOPY Right 2009   KNEE ARTHROSCOPY Left 2011   neck surgery  1984   POSTERIOR CERVICAL FUSION/FORAMINOTOMY N/A 05/29/2018   Procedure: POSTERIOR CERVICAL DECOMPRESSION FUSION CERVICAL 4 - CERVICAL 7 WITH INSTRUMENTATION AND ALLOGRAFT;  Surgeon: Virl Grimes, MD;  Location: [facility] OR;  Service: Orthopedics;  Laterality: N/A;   No Known Allergies Prior to Admission medications   Medication Sig Start Date End Date Taking? Authorizing Provider  acetaminophen  (TYLENOL ) 650 MG CR tablet Take 650 mg by mouth every 8 (eight) hours as needed for pain.   Yes [provider]  alendronate (FOSAMAX) 70 MG tablet Take 70 mg by mouth once a week. Take with a full glass of water  on an empty stomach. 1 tab per week   Yes [provider]  aspirin 81 MG tablet Take 81 mg by mouth daily.   Yes [provider]  atorvastatin  (LIPITOR) 40 MG tablet Take 40 mg by mouth at bedtime.  11/29/15 12/05/23 Yes [provider]  calcium  carbonate (TUMS EX) 750 MG chewable tablet Chew 750 mg by mouth daily as needed for indigestion or heartburn.   Yes [provider]  CAVERJECT  IMPULSE 20 MCG injection INJECT 20 MCG BY INTRACAVITARY  ROUTE AS DIRECTED AS NEEDED FOR   ERECTILE DYSFUNCTION USE NO MORE THAN 3 TIMES WEEKLY 09/03/23  Yes Plotnikov, Aleksei V, MD  cetirizine-pseudoephedrine (ZYRTEC-D) 5-120 MG tablet Take 1 tablet by mouth daily as needed for allergies.    Yes [provider]  cyclobenzaprine  (FLEXERIL ) 5 MG tablet Take 5-10 mg by mouth at bedtime as needed for muscle spasms. 06/26/22  Yes [provider]  diltiazem  2 % GEL Diltiazem  gel 2% apply rectally twice daily for 4 weeks Patient taking differently: Apply 1 Application topically at bedtime. 06/22/21  Yes Lajuan Pila, MD  emtricitabine -rilpivir-tenofovir  AF (ODEFSEY ) 200-25-25 MG TABS tablet Take 1 tablet by mouth daily. 04/10/16  Yes [provider]  gabapentin  (NEURONTIN ) 300 MG capsule Take 300 mg by mouth 3 (three) times daily. 12/01/20  Yes [provider]  hydrochlorothiazide  (HYDRODIURIL ) 50 MG tablet TAKE 1 TABLET BY MOUTH DAILY 04/27/23  Yes Plotnikov, Aleksei V, MD  HYDROcodone -acetaminophen  (NORCO) 7.5-325 MG tablet Take 1 tablet by mouth every 6 (six) hours as needed for severe pain (pain score 7-10). 11/30/23  Yes Plotnikov, Oakley Bellman, MD  hydrocortisone  (ANUSOL -HC) 25 MG suppository Place 1 suppository (25 mg total) rectally 2 (two) times daily. Patient taking differently: Place 25 mg rectally daily. 07/27/23  Yes Lajuan Pila, MD  ibuprofen  (ADVIL ) 200 MG tablet Take 200 mg by mouth every 6 (six) hours as needed for mild pain (pain score 1-3) or moderate pain (pain score 4-6).   Yes [provider]  LINZESS  290 MCG CAPS capsule TAKE 1 CAPSULE BY MOUTH DAILY  BEFORE BREAKFAST 06/25/23  Yes Lajuan Pila, MD  Melatonin 10 MG TABS Take 10 mg by mouth at bedtime.   Yes [provider]  methocarbamol  (ROBAXIN -750) 750 MG tablet Take 1 tablet (750 mg total) by mouth every 8 (eight) hours as needed for muscle spasms. Patient taking differently: Take 500 mg by mouth daily as needed for muscle spasms. 09/11/23  Yes Plotnikov, Oakley Bellman, MD   montelukast  (SINGULAIR ) 10 MG tablet TAKE 1 TABLET BY MOUTH DAILY 06/20/23  Yes Plotnikov, Aleksei V, MD  polyethylene glycol powder (GLYCOLAX /MIRALAX ) powder Take 17 g by mouth daily as needed for severe constipation, moderate constipation or mild constipation.   Yes [provider]  potassium chloride  (KLOR-CON  M) 10 MEQ tablet Take 1 tablet (10 mEq total) by mouth daily. 11/30/23  Yes Plotnikov, Oakley Bellman, MD  ramelteon  (ROZEREM ) 8 MG tablet Take 1 tablet (8 mg total) by mouth at bedtime. 11/30/23  Yes Plotnikov, Oakley Bellman, MD  RESTASIS  0.05 % ophthalmic emulsion Place 1 drop into both eyes 2 (two) times daily as needed (dry eyes). 10/25/15  Yes [provider]  tadalafil  (CIALIS ) 5 MG tablet TAKE 1 TABLET EVERY DAY 11/19/23  Yes Plotnikov, Aleksei V, MD  testosterone  enanthate (DELATESTRYL ) 200 MG/ML injection Inject 1 mL (200 mg total) into the muscle every 14 (fourteen) days. For IM use only 12/07/23   Plotnikov, Oakley Bellman, MD   Social History   Socioeconomic History   Marital status: Married  Spouse name: Phoebe Breed (partner)   Number of children: Not on file   Years of education: 16   Highest education level: Associate degree: occupational, Scientist, product/process development, or vocational program  Occupational History   Occupation: retired    Associate Professor: [employer]    Comment: government work  Tobacco Use   Smoking status: Former    Current packs/day: 0.00    Average packs/day: 2.0 packs/day for 35.0 years (70.0 ttl pk-yrs)    Types: Cigarettes    Start date: 02/09/1969    Quit date: 02/10/2004    Years since quitting: 19.8   Smokeless tobacco: Never  Vaping Use   Vaping status: Never Used  Substance and Sexual Activity   Alcohol  use: No    Comment: quit 2004   Drug use: No   Sexual activity: Yes    Partners: Male    Birth control/protection: Condom    Comment: monogamous w/ single male partner 1981  Other Topics Concern   Not on file  Social History Narrative   [employer]. Work - [employer] - retired 2009. Long term monogamous relationship. Lives in his own home.             Social Drivers of Corporate investment banker Strain: Low Risk  (07/26/2023)   Overall Financial Resource Strain (CARDIA)    Difficulty of Paying Living Expenses: Not hard at all  Food Insecurity: No Food Insecurity (07/26/2023)   Hunger Vital Sign    Worried About Running Out of Food in the Last Year: Never true    Ran Out of Food in the Last Year: Never true  Transportation Needs: No Transportation Needs (07/26/2023)   PRAPARE - Administrator, Civil Service (Medical): No    Lack of Transportation (Non-Medical): No  Physical Activity: Sufficiently Active (07/26/2023)   Exercise Vital Sign    Days of Exercise per Week: 3 days    Minutes of Exercise per Session: 60 min  Stress: No Stress Concern Present (07/26/2023)   [employer] of Occupational Health - Occupational Stress Questionnaire    Feeling of Stress : Only a little  Social Connections: Moderately Integrated (07/26/2023)   Social Connection and Isolation Panel [NHANES]    Frequency of Communication with Friends and Family: Three times a week    Frequency of Social Gatherings with Friends and Family: Once a week    Attends Religious Services: More than 4 times per year    Active Member of [employer] or Organizations: No    Attends Engineer, structural: Not on file    Marital Status: Married   Family History  Problem Relation Age of Onset   Coronary artery disease Father    Heart attack Father    Hypertension Father    Hyperlipidemia Father    Cancer Father        prostate   Colitis Maternal Grandmother    Diabetes Neg Hx    Colon cancer Neg Hx    Rectal cancer Neg Hx    Stomach cancer Neg Hx    Esophageal cancer Neg Hx     ROS: Currently denies lightheadedness, dizziness, Fever, chills, CP, SOB.   No personal history of DVT, PE, MI, or CVA. No loose teeth or  dentures All other systems have been reviewed and were otherwise currently negative with the exception of those mentioned in the HPI and as above.  Objective: Vitals: Ht: 5\' 8"  Wt: 140.5 lbs Temp: 97.6  BP: 137/73 Pulse: 70 O2 95% on room air.   Physical Exam: General: Alert, NAD.  Antalgic Gait  HEENT: EOMI, Good Neck Extension  Pulm: No increased work of breathing.  Clear B/L A/P w/o crackle or wheeze.  CV: RRR, No m/g/r appreciated  GI: soft, NT, ND Neuro: Neuro without gross focal deficit.  Sensation intact distally Skin: No lesions in the area of chief complaint MSK/Surgical Site: left knee w/o redness or effusion. no JLT. ROM 0-0120.  5/5 strength in extension and flexion.  +EHL/FHL.  NVI.  Stable varus and valgus stress.    Imaging Review Plain radiographs demonstrate moderate-severe degenerative joint disease of the left knee.   Preoperative templating of the joint replacement has been completed, documented, and submitted to the Operating Room personnel in order to optimize intra-operative equipment management.  Assessment: OA LEFT KNEE    Plan: Plan for Procedure(s): ARTHROPLASTY, KNEE, TOTAL  The patient history, physical exam, clinical judgement of the provider and imaging are consistent with end stage degenerative joint disease and total joint arthroplasty is deemed medically necessary. The treatment options including medical management, injection therapy, and arthroplasty were discussed at length. The risks and benefits of Procedure(s): ARTHROPLASTY, KNEE, TOTAL were presented and reviewed.  The risks of nonoperative treatment, versus surgical intervention including but not limited to continued pain, aseptic loosening, stiffness, dislocation/subluxation, infection, bleeding, nerve injury, blood clots, cardiopulmonary complications, morbidity, mortality, among others were discussed. The patient verbalizes understanding and wishes to proceed with the plan.  Patient is being  admitted for inpatient treatment for surgery, pain control, PT, prophylactic antibiotics, VTE prophylaxis, progressive ambulation, ADL's and discharge planning.   The patient does meet the criteria for TXA which will be used perioperatively.   Eliquis 2.5 mg BID will be used postoperatively for DVT prophylaxis in addition to SCDs, and early ambulation. The patient is planning to be discharged home with OPPT in care of husband   Abraham Hoffmann, Kirby Peoples 12/17/2023 4:26 PM

## 2023-12-24 ENCOUNTER — Encounter (HOSPITAL_COMMUNITY): Payer: Self-pay | Admitting: Orthopedic Surgery

## 2023-12-25 ENCOUNTER — Ambulatory Visit (HOSPITAL_COMMUNITY): Admitting: Anesthesiology

## 2023-12-25 ENCOUNTER — Ambulatory Visit (HOSPITAL_COMMUNITY)

## 2023-12-25 ENCOUNTER — Ambulatory Visit (HOSPITAL_COMMUNITY)
Admission: RE | Admit: 2023-12-25 | Discharge: 2023-12-25 | Disposition: A | Discharge status: Home / Self Care | Source: Ambulatory Visit | Attending: Orthopedic Surgery | Admitting: Orthopedic Surgery

## 2023-12-25 ENCOUNTER — Encounter (HOSPITAL_COMMUNITY): Payer: Self-pay | Admitting: Orthopedic Surgery

## 2023-12-25 ENCOUNTER — Encounter (HOSPITAL_COMMUNITY)
Admission: RE | Disposition: A | Discharge status: Home / Self Care | Payer: Self-pay | Source: Ambulatory Visit | Attending: Orthopedic Surgery

## 2023-12-25 ENCOUNTER — Other Ambulatory Visit: Payer: Self-pay

## 2023-12-25 ENCOUNTER — Ambulatory Visit (HOSPITAL_BASED_OUTPATIENT_CLINIC_OR_DEPARTMENT_OTHER): Admitting: Anesthesiology

## 2023-12-25 DIAGNOSIS — I10 Essential (primary) hypertension: Secondary | ICD-10-CM | POA: Insufficient documentation

## 2023-12-25 DIAGNOSIS — M1712 Unilateral primary osteoarthritis, left knee: Secondary | ICD-10-CM | POA: Diagnosis present

## 2023-12-25 DIAGNOSIS — I2510 Atherosclerotic heart disease of native coronary artery without angina pectoris: Secondary | ICD-10-CM | POA: Insufficient documentation

## 2023-12-25 DIAGNOSIS — Z87891 Personal history of nicotine dependence: Secondary | ICD-10-CM | POA: Insufficient documentation

## 2023-12-25 HISTORY — PX: TOTAL KNEE ARTHROPLASTY: SHX125

## 2023-12-25 SURGERY — ARTHROPLASTY, KNEE, TOTAL
Anesthesia: Spinal | Site: Knee | Laterality: Left

## 2023-12-25 MED ORDER — ACETAMINOPHEN 500 MG PO TABS
1000.0000 mg | ORAL_TABLET | Freq: Once | ORAL | Status: AC
  Administered 2023-12-25: 1000 mg via ORAL
  Filled 2023-12-25: qty 2

## 2023-12-25 MED ORDER — METHOCARBAMOL 1000 MG/10ML IJ SOLN: 500.0000 mg | Freq: Four times a day (QID) | INTRAMUSCULAR | Status: DC | PRN

## 2023-12-25 MED ORDER — LACTATED RINGERS IV BOLUS
500.0000 mL | Freq: Once | INTRAVENOUS | Status: AC
  Administered 2023-12-25: 500 mL via INTRAVENOUS

## 2023-12-25 MED ORDER — PROPOFOL 10 MG/ML IV BOLUS
INTRAVENOUS | Status: DC | PRN
  Administered 2023-12-25: 100 mg via INTRAVENOUS
  Administered 2023-12-25: 80 mg via INTRAVENOUS
  Administered 2023-12-25 (×4): 20 mg via INTRAVENOUS

## 2023-12-25 MED ORDER — PROPOFOL 500 MG/50ML IV EMUL
INTRAVENOUS | Status: DC | PRN
  Administered 2023-12-25: 100 ug/kg/min via INTRAVENOUS

## 2023-12-25 MED ORDER — TAMSULOSIN HCL 0.4 MG PO CAPS
0.4000 mg | ORAL_CAPSULE | Freq: Once | ORAL | Status: AC
  Administered 2023-12-25: 0.4 mg via ORAL
  Filled 2023-12-25: qty 1

## 2023-12-25 MED ORDER — POVIDONE-IODINE 7.5 % EX SOLN: Freq: Once | CUTANEOUS | Status: DC

## 2023-12-25 MED ORDER — KETOROLAC TROMETHAMINE 30 MG/ML IJ SOLN
INTRAMUSCULAR | Status: DC | PRN
  Administered 2023-12-25: 31 mL

## 2023-12-25 MED ORDER — BUPIVACAINE IN DEXTROSE 0.75-8.25 % IT SOLN
INTRATHECAL | Status: DC | PRN
  Administered 2023-12-25: 1.6 mL via INTRATHECAL

## 2023-12-25 MED ORDER — POVIDONE-IODINE 10 % EX SWAB: 2.0000 | Freq: Once | CUTANEOUS | Status: DC

## 2023-12-25 MED ORDER — CHLORHEXIDINE GLUCONATE 4 % EX SOLN
1.0000 | CUTANEOUS | 1 refills | Status: DC
Start: 2023-12-25 — End: 2023-12-25

## 2023-12-25 MED ORDER — FENTANYL CITRATE PF 50 MCG/ML IJ SOSY: 25.0000 ug | PREFILLED_SYRINGE | INTRAMUSCULAR | Status: DC | PRN

## 2023-12-25 MED ORDER — MUPIROCIN 2 % EX OINT: 1.0000 | TOPICAL_OINTMENT | Freq: Two times a day (BID) | CUTANEOUS | 0 refills | Status: AC

## 2023-12-25 MED ORDER — ACETAMINOPHEN 10 MG/ML IV SOLN: 1000.0000 mg | Freq: Once | INTRAVENOUS | Status: DC | PRN

## 2023-12-25 MED ORDER — ONDANSETRON HCL 4 MG PO TABS: 4.0000 mg | ORAL_TABLET | Freq: Three times a day (TID) | ORAL | 0 refills | Status: DC | PRN

## 2023-12-25 MED ORDER — METHOCARBAMOL 500 MG PO TABS
500.0000 mg | ORAL_TABLET | Freq: Four times a day (QID) | ORAL | Status: DC | PRN
  Administered 2023-12-25: 500 mg via ORAL

## 2023-12-25 MED ORDER — FENTANYL CITRATE (PF) 100 MCG/2ML IJ SOLN
INTRAMUSCULAR | Status: AC
Start: 2023-12-25 — End: ?
  Filled 2023-12-25: qty 2

## 2023-12-25 MED ORDER — OXYCODONE HCL 5 MG PO TABS: 5.0000 mg | ORAL_TABLET | ORAL | 0 refills | Status: DC | PRN

## 2023-12-25 MED ORDER — PROPOFOL 500 MG/50ML IV EMUL
INTRAVENOUS | Status: AC
Start: 2023-12-25 — End: ?
  Filled 2023-12-25: qty 50

## 2023-12-25 MED ORDER — PHENYLEPHRINE HCL-NACL 20-0.9 MG/250ML-% IV SOLN
INTRAVENOUS | Status: DC | PRN
Start: 2023-12-25 — End: 2023-12-25
  Administered 2023-12-25: 10 ug/min via INTRAVENOUS

## 2023-12-25 MED ORDER — OXYCODONE HCL 5 MG PO TABS
5.0000 mg | ORAL_TABLET | ORAL | Status: DC | PRN
  Administered 2023-12-25: 5 mg via ORAL

## 2023-12-25 MED ORDER — ACETAMINOPHEN 500 MG PO TABS
1000.0000 mg | ORAL_TABLET | Freq: Four times a day (QID) | ORAL | Status: DC
  Administered 2023-12-25: 1000 mg via ORAL

## 2023-12-25 MED ORDER — ACETAMINOPHEN 500 MG PO TABS
ORAL_TABLET | ORAL | Status: AC
  Filled 2023-12-25: qty 2

## 2023-12-25 MED ORDER — DEXAMETHASONE SODIUM PHOSPHATE 10 MG/ML IJ SOLN
INTRAMUSCULAR | Status: DC | PRN
  Administered 2023-12-25: 4 mg via INTRAVENOUS

## 2023-12-25 MED ORDER — FENTANYL CITRATE (PF) 100 MCG/2ML IJ SOLN
INTRAMUSCULAR | Status: DC | PRN
  Administered 2023-12-25 (×2): 25 ug via INTRAVENOUS
  Administered 2023-12-25: 50 ug via INTRAVENOUS

## 2023-12-25 MED ORDER — CHLORHEXIDINE GLUCONATE 4 % EX SOLN: 1.0000 | CUTANEOUS | 1 refills | Status: DC

## 2023-12-25 MED ORDER — OXYCODONE HCL 5 MG PO TABS: 5.0000 mg | ORAL_TABLET | Freq: Once | ORAL | Status: DC | PRN

## 2023-12-25 MED ORDER — OXYCODONE HCL 5 MG/5ML PO SOLN: 5.0000 mg | Freq: Once | ORAL | Status: DC | PRN

## 2023-12-25 MED ORDER — FENTANYL CITRATE PF 50 MCG/ML IJ SOSY
50.0000 ug | PREFILLED_SYRINGE | INTRAMUSCULAR | Status: AC
  Administered 2023-12-25: 50 ug via INTRAVENOUS
  Filled 2023-12-25: qty 2

## 2023-12-25 MED ORDER — ONDANSETRON HCL 4 MG/2ML IJ SOLN
INTRAMUSCULAR | Status: DC | PRN
  Administered 2023-12-25: 4 mg via INTRAVENOUS

## 2023-12-25 MED ORDER — OXYCODONE HCL 5 MG PO TABS
ORAL_TABLET | ORAL | Status: AC
  Filled 2023-12-25: qty 1

## 2023-12-25 MED ORDER — METHOCARBAMOL 750 MG PO TABS: 750.0000 mg | ORAL_TABLET | Freq: Three times a day (TID) | ORAL | 2 refills | Status: DC | PRN

## 2023-12-25 MED ORDER — ONDANSETRON HCL 4 MG/2ML IJ SOLN: 4.0000 mg | Freq: Once | INTRAMUSCULAR | Status: DC | PRN

## 2023-12-25 MED ORDER — TRANEXAMIC ACID-NACL 1000-0.7 MG/100ML-% IV SOLN
1000.0000 mg | INTRAVENOUS | Status: AC
  Administered 2023-12-25: 1000 mg via INTRAVENOUS
  Filled 2023-12-25: qty 100

## 2023-12-25 MED ORDER — ORAL CARE MOUTH RINSE: 15.0000 mL | Freq: Once | OROMUCOSAL | Status: AC

## 2023-12-25 MED ORDER — LACTATED RINGERS IV BOLUS
250.0000 mL | Freq: Once | INTRAVENOUS | Status: AC
  Administered 2023-12-25: 250 mL via INTRAVENOUS

## 2023-12-25 MED ORDER — SODIUM CHLORIDE 0.9 % IR SOLN
Status: DC | PRN
  Administered 2023-12-25: 1000 mL

## 2023-12-25 MED ORDER — 0.9 % SODIUM CHLORIDE (POUR BTL) OPTIME
TOPICAL | Status: DC | PRN
  Administered 2023-12-25: 1000 mL

## 2023-12-25 MED ORDER — CHLORHEXIDINE GLUCONATE 0.12 % MT SOLN
15.0000 mL | Freq: Once | OROMUCOSAL | Status: AC
  Administered 2023-12-25: 15 mL via OROMUCOSAL

## 2023-12-25 MED ORDER — OXYCODONE HCL 5 MG PO TABS: 10.0000 mg | ORAL_TABLET | ORAL | Status: DC | PRN

## 2023-12-25 MED ORDER — ROPIVACAINE HCL 5 MG/ML IJ SOLN
INTRAMUSCULAR | Status: DC | PRN
  Administered 2023-12-25: 20 mL via PERINEURAL

## 2023-12-25 MED ORDER — LACTATED RINGERS IV SOLN: INTRAVENOUS | Status: DC

## 2023-12-25 MED ORDER — MIDAZOLAM HCL 2 MG/2ML IJ SOLN: 1.0000 mg | INTRAMUSCULAR | Status: DC

## 2023-12-25 MED ORDER — VANCOMYCIN HCL 1000 MG IV SOLR: 1000.0000 mg | Freq: Once | INTRAVENOUS | Status: DC

## 2023-12-25 MED ORDER — APIXABAN 2.5 MG PO TABS: 2.5000 mg | ORAL_TABLET | Freq: Two times a day (BID) | ORAL | 0 refills | Status: DC

## 2023-12-25 MED ORDER — CEFAZOLIN SODIUM-DEXTROSE 2-4 GM/100ML-% IV SOLN
2.0000 g | INTRAVENOUS | Status: AC
  Administered 2023-12-25: 2 g via INTRAVENOUS
  Filled 2023-12-25: qty 100

## 2023-12-25 MED ORDER — METHOCARBAMOL 500 MG PO TABS
ORAL_TABLET | ORAL | Status: AC
  Filled 2023-12-25: qty 1

## 2023-12-25 MED ORDER — VANCOMYCIN HCL IN DEXTROSE 1-5 GM/200ML-% IV SOLN
1000.0000 mg | INTRAVENOUS | Status: AC
  Administered 2023-12-25: 1000 mg via INTRAVENOUS
  Filled 2023-12-25: qty 200

## 2023-12-25 MED ORDER — LIDOCAINE HCL (CARDIAC) PF 100 MG/5ML IV SOSY
PREFILLED_SYRINGE | INTRAVENOUS | Status: DC | PRN
  Administered 2023-12-25: 50 mg via INTRAVENOUS

## 2023-12-25 MED ORDER — MUPIROCIN 2 % EX OINT: 1.0000 | TOPICAL_OINTMENT | Freq: Two times a day (BID) | CUTANEOUS | 0 refills | Status: DC

## 2023-12-25 SURGICAL SUPPLY — 50 items
ATTUNE MED DOME PAT 38 KNEE (Knees) IMPLANT
BAG COUNTER SPONGE SURGICOUNT (BAG) IMPLANT
BAG ZIPLOCK 12X15 (MISCELLANEOUS) IMPLANT
BASEPLATE TIB CMT CRS FB SZ 7 (Knees) IMPLANT
BLADE SAG 18X100X1.27 (BLADE) ×1 IMPLANT
BLADE SAW SGTL 11.0X1.19X90.0M (BLADE) IMPLANT
BLADE SAW SGTL 13X75X1.27 (BLADE) ×1 IMPLANT
BLADE SURG 15 STRL LF DISP TIS (BLADE) ×1 IMPLANT
BNDG ELASTIC 6X10 VLCR STRL LF (GAUZE/BANDAGES/DRESSINGS) ×1 IMPLANT
BOWL SMART MIX CTS (DISPOSABLE) ×1 IMPLANT
CEMENT HV SMART SET (Cement) ×2 IMPLANT
CLSR STERI-STRIP ANTIMIC 1/2X4 (GAUZE/BANDAGES/DRESSINGS) ×2 IMPLANT
COMPONENT FEM ATN CRS SZ7 LT (Femur) IMPLANT
COVER SURGICAL LIGHT HANDLE (MISCELLANEOUS) ×1 IMPLANT
CUFF TRNQT CYL 24X4X16.5-23 (TOURNIQUET CUFF) IMPLANT
CUFF TRNQT CYL 34X4.125X (TOURNIQUET CUFF) ×1 IMPLANT
DRAPE SHEET LG 3/4 BI-LAMINATE (DRAPES) ×1 IMPLANT
DRAPE U-SHAPE 47X51 STRL (DRAPES) ×1 IMPLANT
DRSG MEPILEX POST OP 4X12 (GAUZE/BANDAGES/DRESSINGS) ×1 IMPLANT
DURAPREP 26ML APPLICATOR (WOUND CARE) ×2 IMPLANT
ELECT PENCIL ROCKER SW 15FT (MISCELLANEOUS) ×1 IMPLANT
ELECT REM PT RETURN 15FT ADLT (MISCELLANEOUS) ×1 IMPLANT
GAUZE PAD ABD 8X10 STRL (GAUZE/BANDAGES/DRESSINGS) ×2 IMPLANT
GLOVE BIO SURGEON STRL SZ 6.5 (GLOVE) ×1 IMPLANT
GLOVE BIO SURGEON STRL SZ7.5 (GLOVE) ×1 IMPLANT
GLOVE BIOGEL PI IND STRL 7.0 (GLOVE) ×1 IMPLANT
GLOVE BIOGEL PI IND STRL 8 (GLOVE) ×1 IMPLANT
GOWN STRL SURGICAL XL XLNG (GOWN DISPOSABLE) ×2 IMPLANT
HOLDER FOLEY CATH W/STRAP (MISCELLANEOUS) ×1 IMPLANT
HOOD PEEL AWAY T7 (MISCELLANEOUS) ×3 IMPLANT
IMMOBILIZER KNEE 20 (SOFTGOODS) ×1 IMPLANT
IMMOBILIZER KNEE 20 THIGH 36 (SOFTGOODS) ×1 IMPLANT
INSERT TIB BEAR AOX ATTUNE 7 6 (Insert) IMPLANT
KIT TURNOVER KIT A (KITS) ×1 IMPLANT
MANIFOLD NEPTUNE II (INSTRUMENTS) ×1 IMPLANT
NS IRRIG 1000ML POUR BTL (IV SOLUTION) ×1 IMPLANT
PACK TOTAL KNEE CUSTOM (KITS) ×1 IMPLANT
PIN STEINMAN FIXATION KNEE (PIN) IMPLANT
PROTECTOR NERVE ULNAR (MISCELLANEOUS) ×1 IMPLANT
SET HNDPC FAN SPRY TIP SCT (DISPOSABLE) ×1 IMPLANT
SET PAD KNEE POSITIONER (MISCELLANEOUS) ×1 IMPLANT
SPIKE FLUID TRANSFER (MISCELLANEOUS) IMPLANT
SUT MNCRL AB 3-0 PS2 18 (SUTURE) IMPLANT
SUT STRATAFIX PDS+ 0 24IN (SUTURE) ×1 IMPLANT
SUT VIC AB 2-0 CT1 TAPERPNT 27 (SUTURE) ×2 IMPLANT
SUT VIC AB 3-0 SH 27X BRD (SUTURE) ×2 IMPLANT
TRAY FOLEY MTR SLVR 16FR STAT (SET/KITS/TRAYS/PACK) ×1 IMPLANT
TUBE SUCTION HIGH CAP CLEAR NV (SUCTIONS) ×1 IMPLANT
WATER STERILE IRR 1000ML POUR (IV SOLUTION) ×2 IMPLANT
WRAP KNEE MAXI GEL POST OP (GAUZE/BANDAGES/DRESSINGS) ×1 IMPLANT

## 2023-12-25 NOTE — Evaluation (Signed)
 Physical Therapy Evaluation Patient Details Name: Troy Marsh MRN: [id number] DOB: 1951/03/04 Today's Date: 12/25/2023  History of Present Illness  73 yo male presents to therapy s/p L TKA on 12/25/2023 due to failure of conservative measures. Pt PMH includes but is not limited to: arthritis, HIV, HLD, HTN, IBS, cervical and lumbar surgery.  Clinical Impression    SHAILEN THIELEN is a 73 y.o. male POD 0 s/p L TKA. Patient reports IND with mobility at baseline. Patient is now limited by functional impairments (see PT problem list below) and requires CGA and cues for transfers and gait with RW. Patient was able to ambulate 45 feet with RW and CGA and progressing to close S  and cues for safe walker management. Patient educated on safe sequencing for stair mobility with one handrail and SPC, fall risk prevention, pain management and goal, use of CP/ice, car transfers pt and spouse verbalized understanding of safe guarding position for people assisting with mobility. Patient instructed in exercises to facilitate ROM and circulation reviewed and HO provided. Patient will benefit from continued skilled PT interventions to address impairments and progress towards PLOF. Patient has met mobility goals at adequate level for discharge home with family support and OPPT services; will continue to follow if pt continues acute stay to progress towards Mod I goals.       If plan is discharge home, recommend the following: A little help with walking and/or transfers;A little help with bathing/dressing/bathroom;Assistance with cooking/housework;Assist for transportation;Help with stairs or ramp for entrance   Can travel by private vehicle        Equipment Recommendations Rolling walker (2 wheels)  Recommendations for Other Services       Functional Status Assessment Patient has had a recent decline in their functional status and demonstrates the ability to make significant improvements in function in a  reasonable and predictable amount of time.     Precautions / Restrictions Precautions Precautions: Fall;Knee Restrictions Weight Bearing Restrictions Per Provider Order: No      Mobility  Bed Mobility Overal bed mobility: Needs Assistance Bed Mobility: Supine to Sit     Supine to sit: Supervision     General bed mobility comments: min cues    Transfers Overall transfer level: Needs assistance Equipment used: Rolling walker (2 wheels) Transfers: Sit to/from Stand Sit to Stand: Contact guard assist           General transfer comment: min cues    Ambulation/Gait Ambulation/Gait assistance: Contact guard assist Gait Distance (Feet): 45 Feet Assistive device: Rolling walker (2 wheels) Gait Pattern/deviations: Step-to pattern, Antalgic, Decreased stance time - left Gait velocity: decreased     General Gait Details: B UE support at RW, pt electing B elbow flexion indicating it felt better on his back, min cues for safety and RW management  Stairs Stairs: Yes Stairs assistance: Contact guard assist Stair Management: Two rails, One rail Left Number of Stairs: 3 General stair comments: step training initiated with B handrail and pt able to provide correct self cues for step to pattern and pt progressed to step navigation with L handrail and SPC  Wheelchair Mobility     Tilt Bed    Modified Rankin (Stroke Patients Only)       Balance Overall balance assessment: Needs assistance Sitting-balance support: Feet supported Sitting balance-Leahy Scale: Good     Standing balance support: Bilateral upper extremity supported, During functional activity, Reliant on assistive device for balance Standing balance-Leahy Scale: Poor  Pertinent Vitals/Pain Pain Assessment Pain Assessment: 0-10 Pain Score: 2  Pain Location: back and L knee and LE Pain Descriptors / Indicators: Aching, Constant, Discomfort, Dull, Grimacing,  Operative site guarding Pain Intervention(s): Limited activity within patient's tolerance, Monitored during session, Premedicated before session, Repositioned, Ice applied    Home Living Family/patient expects to be discharged to:: Private residence Living Arrangements: Spouse/significant other Available Help at Discharge: Family;Available 24 hours/day Type of Home: Other(Comment) Home Access: Stairs to enter Entrance Stairs-Rails: Right;Left Entrance Stairs-Number of Steps: 5 Alternate Level Stairs-Number of Steps: flight with chair lift Home Layout: Two level;1/2 bath on main level Home Equipment: Cane - single point;Shower seat;Grab bars - tub/shower      Prior Function Prior Level of Function : Independent/Modified Independent             Mobility Comments: IND no AD for all ADLs, self care and IADLs       Extremity/Trunk Assessment        Lower Extremity Assessment Lower Extremity Assessment: LLE deficits/detail LLE Deficits / Details: ankle DF/PF 5/5; SLR < 10 degree lag LLE Sensation: WNL    Cervical / Trunk Assessment Cervical / Trunk Assessment: Back Surgery  Communication   Communication Communication: No apparent difficulties    Cognition Arousal: Alert Behavior During Therapy: WFL for tasks assessed/performed   PT - Cognitive impairments: No apparent impairments                         Following commands: Intact       Cueing       General Comments      Exercises Total Joint Exercises Ankle Circles/Pumps: AROM, Both, 10 reps Quad Sets: AROM, Left, 5 reps Short Arc Quad: AROM, Left, 5 reps Heel Slides: AROM, Left, 5 reps Hip ABduction/ADduction: AROM, Left, 5 reps Straight Leg Raises: AROM, Left, 5 reps Knee Flexion: AROM, Left, 5 reps   Assessment/Plan    PT Assessment Patient needs continued PT services  PT Problem List Decreased strength;Decreased activity tolerance;Decreased range of motion;Decreased balance;Decreased  mobility;Pain       PT Treatment Interventions DME instruction;Gait training;Stair training;Functional mobility training;Therapeutic activities;Therapeutic exercise;Balance training;Neuromuscular re-education;Modalities;Patient/family education    PT Goals (Current goals can be found in the Care Plan section)  Acute Rehab PT Goals Patient Stated Goal: walk again-5 miles and up to 23, return to the gym PT Goal Formulation: With patient Time For Goal Achievement: 01/08/24 Potential to Achieve Goals: Good    Frequency 7X/week     Co-evaluation               AM-PAC PT "6 Clicks" Mobility  Outcome Measure Help needed turning from your back to your side while in a flat bed without using bedrails?: None Help needed moving from lying on your back to sitting on the side of a flat bed without using bedrails?: A Little Help needed moving to and from a bed to a chair (including a wheelchair)?: A Little Help needed standing up from a chair using your arms (e.g., wheelchair or bedside chair)?: A Little Help needed to walk in hospital room?: A Little Help needed climbing 3-5 steps with a railing? : A Little 6 Click Score: 19    End of Session Equipment Utilized During Treatment: Gait belt Activity Tolerance: Patient tolerated treatment well;No increased pain Patient left: in chair;with call bell/phone within reach;with family/visitor present Nurse Communication: Mobility status;Other (comment) (pt readiness for d/c from PT standpoint) PT Visit  Diagnosis: Unsteadiness on feet (R26.81);Other abnormalities of gait and mobility (R26.89);Muscle weakness (generalized) (M62.81);Difficulty in walking, not elsewhere classified (R26.2);Pain Pain - Right/Left: Left Pain - part of body: Knee;Leg (back)    Time: 1610-9604 PT Time Calculation (min) (ACUTE ONLY): 46 min   Charges:   PT Evaluation $PT Eval Low Complexity: 1 Low PT Treatments $Gait Training: 8-22 mins $Therapeutic Exercise: 8-22  mins PT General Charges $$ ACUTE PT VISIT: 1 Visit         Cary Clarks, PT Acute Rehab   Annalee Kiang 12/25/2023, 7:10 PM

## 2023-12-25 NOTE — Anesthesia Procedure Notes (Signed)
 Spinal  Patient location during procedure: OR Start time: 12/25/2023 12:41 PM End time: 12/25/2023 12:44 PM Reason for block: surgical anesthesia Staffing Performed: anesthesiologist  Anesthesiologist: Vernadine Golas, MD Performed by: Vernadine Golas, MD Authorized by: Vernadine Golas, MD   Preanesthetic Checklist Completed: patient identified, IV checked, risks and benefits discussed, surgical consent, monitors and equipment checked, pre-op evaluation and timeout performed Spinal Block Patient position: sitting Prep: DuraPrep and site prepped and draped Patient monitoring: continuous pulse ox, blood pressure and heart rate Approach: midline Location: L4-5 Injection technique: single-shot Needle Needle type: Pencan  Needle gauge: 24 G Needle length: 9 cm Assessment Events: CSF return Additional Notes Risks, benefits, and alternative discussed. Patient gave consent to procedure. Prepped and draped in sitting position. Patient sedated but responsive to voice. Clear CSF obtained after one needle pass. Positive terminal aspiration. No pain or paraesthesias with injection. Patient tolerated procedure well. Vital signs stable. Amador Junes, MD

## 2023-12-25 NOTE — Anesthesia Procedure Notes (Signed)
 Procedure Name: LMA Insertion Date/Time: 12/25/2023 1:10 PM  Performed by: Erleen Hawking, CRNAPre-anesthesia Checklist: Patient identified, Emergency Drugs available, Suction available, Patient being monitored and Timeout performed Patient Re-evaluated:Patient Re-evaluated prior to induction Oxygen Delivery Method: Circle system utilized Preoxygenation: Pre-oxygenation with 100% oxygen Induction Type: IV induction Ventilation: Mask ventilation without difficulty LMA: LMA inserted LMA Size: 4.0 Tube size: 4.0 mm Number of attempts: 1 Placement Confirmation: positive ETCO2 and breath sounds checked- equal and bilateral Tube secured with: Tape Dental Injury: Teeth and Oropharynx as per pre-operative assessment

## 2023-12-25 NOTE — Anesthesia Postprocedure Evaluation (Signed)
 Anesthesia Post Note  Patient: Troy Marsh  Procedure(s) Performed: ARTHROPLASTY, KNEE, TOTAL (Left: Knee)     Patient location during evaluation: PACU Anesthesia Type: Spinal and General Level of consciousness: awake and alert Pain management: pain level controlled Vital Signs Assessment: post-procedure vital signs reviewed and stable Respiratory status: spontaneous breathing, nonlabored ventilation, respiratory function stable and patient connected to nasal cannula oxygen Cardiovascular status: blood pressure returned to baseline and stable Postop Assessment: no apparent nausea or vomiting Anesthetic complications: no  No notable events documented.  Last Vitals:  Vitals:   12/25/23 1645 12/25/23 1700  BP: 138/81 (!) 149/71  Pulse: 63 63  Resp: 15 17  Temp:    SpO2: 100% 99%    Last Pain:  Vitals:   12/25/23 1700  TempSrc:   PainSc: 0-No pain    LLE Motor Response: Purposeful movement (12/25/23 1700) LLE Sensation: Full sensation (12/25/23 1700) RLE Motor Response: Purposeful movement (12/25/23 1700) RLE Sensation: Full sensation (12/25/23 1700) L Sensory Level: S1-Sole of foot, small toes (12/25/23 1700) R Sensory Level: S1-Sole of foot, small toes (12/25/23 1700)  Rosalita Combe

## 2023-12-25 NOTE — Anesthesia Procedure Notes (Signed)
 Anesthesia Regional Block: Adductor canal block   Pre-Anesthetic Checklist: , timeout performed,  Correct Patient, Correct Site, Correct Laterality,  Correct Procedure, Correct Position, site marked,  Risks and benefits discussed,  Surgical consent,  Pre-op evaluation,  At surgeon's request and post-op pain management  Laterality: Left  Prep: Maximum Sterile Barrier Precautions used, chloraprep       Needles:  Injection technique: Single-shot  Needle Type: Echogenic Needle      Needle Gauge: 20     Additional Needles:   Procedures:,,,, ultrasound used (permanent image in chart),,    Narrative:  Start time: 12/25/2023 11:40 AM End time: 12/25/2023 11:44 AM Injection made incrementally with aspirations every 5 mL.  Performed by: Personally  Anesthesiologist: Leslye Rast, MD

## 2023-12-25 NOTE — Discharge Instructions (Signed)
 INSTRUCTIONS AFTER JOINT REPLACEMENT   Remove items at home which could result in a fall. This includes throw rugs or furniture in walking pathways ICE to the affected joint every three hours while awake for 30 minutes at a time, for at least the first 3-5 days, and then as needed for pain and swelling.  Continue to use ice for pain and swelling. You may notice swelling that will progress down to the foot and ankle.  This is normal after surgery.  Elevate your leg when you are not up walking on it.   Continue to use the breathing machine you got in the hospital (incentive spirometer) which will help keep your temperature down.  It is common for your temperature to cycle up and down following surgery, especially at night when you are not up moving around and exerting yourself.  The breathing machine keeps your lungs expanded and your temperature down.   DIET:  As you were doing prior to hospitalization, we recommend a well-balanced diet.  DRESSING / WOUND CARE / SHOWERING  You may shower 3 days after surgery, but keep the wounds dry during showering.  You may use an occlusive plastic wrap (Press'n Seal for example), NO SOAKING/SUBMERGING IN THE BATHTUB.  If the bandage gets wet, change with a clean dry gauze.  If the incision gets wet, pat the wound dry with a clean towel. You have an ace wrap on your leg you may remove this and 2 white floating abd pads underneath 24 hours hours after surgery. From then on, please wear the compression stockings during the day, you may remove them at night.   ACTIVITY  Increase activity slowly as tolerated, but follow the weight bearing instructions below.   No driving for 6 weeks or until further direction given by your physician.  You cannot drive while taking narcotics.  No lifting or carrying greater than 10 lbs. until further directed by your surgeon. Avoid periods of inactivity such as sitting longer than an hour when not asleep. This helps prevent blood  clots.  You may return to work once you are authorized by your doctor.     WEIGHT BEARING   Weight bearing as tolerated with assist device (walker, cane, etc) as directed, use it as long as suggested by your surgeon or therapist, typically at least 4-6 weeks.   EXERCISES  Results after joint replacement surgery are often greatly improved when you follow the exercise, range of motion and muscle strengthening exercises prescribed by your doctor. Safety measures are also important to protect the joint from further injury. Any time any of these exercises cause you to have increased pain or swelling, decrease what you are doing until you are comfortable again and then slowly increase them. If you have problems or questions, call your caregiver or physical therapist for advice.   Rehabilitation is important following a joint replacement. After just a few days of immobilization, the muscles of the leg can become weakened and shrink (atrophy).  These exercises are designed to build up the tone and strength of the thigh and leg muscles and to improve motion. Often times heat used for twenty to thirty minutes before working out will loosen up your tissues and help with improving the range of motion but do not use heat for the first two weeks following surgery (sometimes heat can increase post-operative swelling).   These exercises can be done on a training (exercise) mat, on the floor, on a table or on a bed. Use  whatever works the best and is most comfortable for you.    Use music or television while you are exercising so that the exercises are a pleasant break in your day. This will make your life better with the exercises acting as a break in your routine that you can look forward to.   Perform all exercises about fifteen times, three times per day or as directed.  You should exercise both the operative leg and the other leg as well.  Exercises include:   Quad Sets - Tighten up the muscle on the front  of the thigh (Quad) and hold for 5-10 seconds.   Straight Leg Raises - With your knee straight (if you were given a brace, keep it on), lift the leg to 60 degrees, hold for 3 seconds, and slowly lower the leg.  Perform this exercise against resistance later as your leg gets stronger.  Leg Slides: Lying on your back, slowly slide your foot toward your buttocks, bending your knee up off the floor (only go as far as is comfortable). Then slowly slide your foot back down until your leg is flat on the floor again.  Angel Wings: Lying on your back spread your legs to the side as far apart as you can without causing discomfort.  Hamstring Strength:  Lying on your back, push your heel against the floor with your leg straight by tightening up the muscles of your buttocks.  Repeat, but this time bend your knee to a comfortable angle, and push your heel against the floor.  You may put a pillow under the heel to make it more comfortable if necessary.   A rehabilitation program following joint replacement surgery can speed recovery and prevent re-injury in the future due to weakened muscles. Contact your doctor or a physical therapist for more information on knee rehabilitation.    CONSTIPATION  Constipation is defined medically as fewer than three stools per week and severe constipation as less than one stool per week.  Even if you have a regular bowel pattern at home, your normal regimen is likely to be disrupted due to multiple reasons following surgery.  Combination of anesthesia, postoperative narcotics, change in appetite and fluid intake all can affect your bowels.   YOU MUST use at least one of the following options; they are listed in order of increasing strength to get the job done.  They are all available over the counter, and you may need to use some, POSSIBLY even all of these options:    Drink plenty of fluids (prune juice may be helpful) and high fiber foods Colace 100 mg by mouth twice a day   Senokot for constipation as directed and as needed Dulcolax (bisacodyl ), take with full glass of water   Miralax  (polyethylene glycol) once or twice a day as needed.  If you have tried all these things and are unable to have a bowel movement in the first 3-4 days after surgery call either your surgeon or your primary doctor.    If you experience loose stools or diarrhea, hold the medications until you stool forms back up.  If your symptoms do not get better within 1 week or if they get worse, check with your doctor.  If you experience "the worst abdominal pain ever" or develop nausea or vomiting, please contact the office immediately for further recommendations for treatment.   ITCHING:  If you experience itching with your medications, try taking only a single pain pill, or even half a pain  pill at a time.  You can also use Benadryl  over the counter for itching or also to help with sleep.   TED HOSE STOCKINGS:  Use stockings on both legs until for at least 2 weeks or as directed by physician office. They may be removed at night for sleeping.  MEDICATIONS:  See your medication summary on the "After Visit Summary" that nursing will review with you.  You may have some home medications which will be placed on hold until you complete the course of blood thinner medication.  It is important for you to complete the blood thinner medication as prescribed.  PRECAUTIONS:  If you experience chest pain or shortness of breath - call 911 immediately for transfer to the hospital emergency department.   If you develop a fever greater that 101 F, purulent drainage from wound, increased redness or drainage from wound, foul odor from the wound/dressing, or calf pain - CONTACT YOUR SURGEON.                                                   FOLLOW-UP APPOINTMENTS:  If you do not already have a post-op appointment, please call the office for an appointment to be seen by your surgeon.  Guidelines for how soon to be seen  are listed in your "After Visit Summary", but are typically between 1-4 weeks after surgery.  OTHER INSTRUCTIONS:   Knee Replacement:  Do not place pillow under knee, focus on keeping the knee straight while resting. CPM instructions: 0-90 degrees, 2 hours in the morning, 2 hours in the afternoon, and 2 hours in the evening. Place foam block, curve side up under heel at all times except when in CPM or when walking.  DO NOT modify, tear, cut, or change the foam block in any way.  POST-OPERATIVE OPIOID TAPER INSTRUCTIONS: It is important to wean off of your opioid medication as soon as possible. If you do not need pain medication after your surgery it is ok to stop day one. Opioids include: Codeine, Hydrocodone (Norco, Vicodin), Oxycodone (Percocet, oxycontin ) and hydromorphone  amongst others.  Long term and even short term use of opiods can cause: Increased pain response Dependence Constipation Depression Respiratory depression And more.  Withdrawal symptoms can include Flu like symptoms Nausea, vomiting And more Techniques to manage these symptoms Hydrate well Eat regular healthy meals Stay active Use relaxation techniques(deep breathing, meditating, yoga) Do Not substitute Alcohol  to help with tapering If you have been on opioids for less than two weeks and do not have pain than it is ok to stop all together.  Plan to wean off of opioids This plan should start within one week post op of your joint replacement. Maintain the same interval or time between taking each dose and first decrease the dose.  Cut the total daily intake of opioids by one tablet each day Next start to increase the time between doses. The last dose that should be eliminated is the evening dose.   MAKE SURE YOU:  Understand these instructions.  Get help right away if you are not doing well or get worse.    Thank you for letting us  be a part of your medical care team.  It is a privilege we respect greatly.  We  hope these instructions will help you stay on track for a fast and full recovery!

## 2023-12-25 NOTE — Op Note (Signed)
 DATE OF SURGERY:  12/25/2023 TIME: 3:39 PM  PATIENT NAME:  Troy Marsh   AGE: 73 y.o.    PRE-OPERATIVE DIAGNOSIS: Left knee primary localized osteoarthritis  POST-OPERATIVE DIAGNOSIS:  Same  PROCEDURE: Left total Knee Arthroplasty  SURGEON:  Neville Barbone, MD   ASSISTANT:  Hurshel Maidens, PA-C, present and scrubbed throughout the case, critical for assistance with exposure, retraction, instrumentation, and closure.  OPERATIVE IMPLANTS: Implant Name: CEMENT HV SMART SET - [id number] Type: Cement Inv. Item: CEMENT HV SMART SET Serial No.:  Manufacturer: [employer] Lot No.: [id number] LRB: Left No. Used: 2 Action: Implanted   Implant Name: ATTUNE MED DOME PAT 38 KNEE - [id number] Type: Knees Inv. Item: ATTUNE MED DOME PAT 38 KNEE Serial No.:  Manufacturer: [employer] Lot No.: [id number] LRB: Left No. Used: 1 Action: Implanted   Implant Name: COMPONENT FEM ATN CRS SZ7 LT - [id number] Type: Femur Inv. Item: COMPONENT FEM ATN CRS SZ7 LT Serial No.:  Manufacturer: [employer] Lot No.: [id number] LRB: Left No. Used: 1 Action: Implanted   Implant Name: BASEPLATE TIB CMT CRS FB SZ 7 - [id number] Type: Knees Inv. Item: BASEPLATE TIB CMT CRS FB SZ 7 Serial No.:  Manufacturer: [employer] Lot No.: [id number] LRB: Left No. Used: 1 Action: Implanted   Implant Name: ATTUNE KNEE SYSTEM REVISION CRS FIXED BEARING INSERT SIZE 7 Type:  Inv. Item:  Serial No.:  Manufacturer: [employer] Lot No.: [id number] LRB: Left No. Used: 1 Action: Implanted   PREOPERATIVE INDICATIONS:  Troy Marsh is a 73 y.o. year old male with end stage bone on bone degenerative arthritis of the knee who failed conservative treatment, including injections, antiinflammatories, activity modification, and assistive devices, and had significant impairment of their activities of daily living, and elected for Total Knee Arthroplasty.  He had valgus  alignment.  The risks, benefits, and alternatives were discussed at length including but not limited to the risks of infection, bleeding, nerve injury, stiffness, blood clots, the need for revision surgery, cardiopulmonary complications, among others, and they were willing to proceed.  OPERATIVE FINDINGS AND UNIQUE ASPECTS OF THE CASE: His disease was really lateral, the rest of the knee looked overall fairly good.  I cut the femur on 10, because the lateral condyle was somewhat hypoplastic, and the 9 would not of gotten much bone at all.  I cut the tibia using the alignment jig, and felt like it was a appropriate cut although there was a little bit more lateral bone removed as compared to the medial side.  During the initial trialing process, the knee was still extremely tight on the medial aspect, and so I went back and took another 2 mm off of the tibia, and then I was able to fit a size 6 polyethylene.  The lateral side however was somewhat unstable, and it opened up to varus testing, such that I was concerned that I had compromised the LCL during the femoral cut.  I did in fact have some capsular disruption, although it looked like the fibers of the LCL were still intact, but nonetheless the feel was somewhat unstable.  Therefore I converted to a more constrained system, using the revision components, after intraoperative consultation with Hali Leventhal, MD.  Rafael Bun BLOOD LOSS: 200 mL  OPERATIVE DESCRIPTION:  The patient was brought to the operative room and placed in a supine position.  Anesthesia was administered.  IV antibiotics were given.  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    The lower extremity was prepped and draped in the usual sterile fashion.  Time out was performed.  The leg was elevated and exsanguinated and the tourniquet was inflated.  Anterior quadriceps tendon splitting approach was  performed.  The patella was everted and osteophytes were removed.  The anterior horn of the medial and lateral meniscus was removed.   The patella was then measured, and cut with the saw.  The thickness before the cut was 25 and after the cut was 14.5.  A metal shield was used to protect the patella throughout the case.    The distal femur was opened with the drill and the intramedullary distal femoral cutting jig was utilized, set at 5 degrees resecting 9 mm off the distal femur.  Care was taken to protect the collateral ligaments.  Then the extramedullary tibial cutting jig was utilized making the appropriate cut using the anterior tibial crest as a reference building in appropriate posterior slope.  Care was taken during the cut to protect the medial and collateral ligaments.  The proximal tibia was removed along with the posterior horns of the menisci.  The PCL was sacrificed.    The extensor gap was measured and found to have adequate resection, measuring to a size 6.    The distal femoral sizing jig was applied, taking care to avoid notching.  This was set at 3 degrees of external rotation.  Then the 4-in-1 cutting jig was applied and the anterior and posterior femur was cut, along with the chamfer cuts.  All posterior osteophytes were removed.  The flexion gap was then measured and was symmetric with the extension gap.  I completed the distal femoral preparation using the appropriate jig to prepare the box.  The proximal tibia sized and prepared accordingly with the reamer and the punch, and then all components were trialed with the poly insert.  Initially, I could not get the polyethylene in in flexion, so I went back and cut 2 more millimeters off of the tibia.  I was then capable of getting a polyethylene in for trialing, but the lateral side felt a little bit unstable.  I called for an intraoperative consult with Dr. Bernard Brick, and then although inspection did not appear that the collateral  ligament had been damaged, but he did feel more unstable to varus testing so decision was made to convert to a constrained system using revision components.  I prepared the tibia with the slightly longer drill, and prepared the femur with a slightly deeper box cut.  I then tested the above-named components with the trials.  The knee was found to have excellent balance and full motion.    The above named components were then cemented into place and all excess cement was removed.  The real polyethylene implant was placed.  As the cement had cured I released the tourniquet and confirmed excellent hemostasis with no major posterior vessel injury.    The knee was easily taken through a range of motion and the patella tracked well and the knee irrigated copiously and the parapatellar tissue closed with Stratafix and vicryl, and subcutaneous tissue closed with vicryl, and monocryl with steri strips for the skin.  The wounds were injected with marcaine , and dressed with sterile gauze and the patient was awakened and returned to the PACU in stable and satisfactory condition.  There were no complications.  Total tourniquet time was  127 minutes.

## 2023-12-25 NOTE — Transfer of Care (Signed)
 Immediate Anesthesia Transfer of Care Note  Patient: Troy Marsh  Procedure(s) Performed: Procedure(s): ARTHROPLASTY, KNEE, TOTAL (Left)  Patient Location: PACU  Anesthesia Type:General  Level of Consciousness:  sedated, patient cooperative and responds to stimulation  Airway & Oxygen Therapy:Patient Spontanous Breathing and Patient connected to face mask oxgen  Post-op Assessment:  Report given to PACU RN and Post -op Vital signs reviewed and stable  Post vital signs:  Reviewed and stable  Last Vitals:  Vitals:   12/25/23 1120 12/25/23 1125  BP: (!) 146/99 (!) 163/94  Pulse: 64 63  Resp: 18 16  Temp:    SpO2: 99% 100%    Complications: No apparent anesthesia complications

## 2023-12-25 NOTE — Anesthesia Preprocedure Evaluation (Addendum)
 Anesthesia Evaluation  Patient identified by MRN, date of birth, ID band Patient awake    Reviewed: Allergy & Precautions, NPO status , Patient's Chart, lab work & pertinent test results, reviewed documented beta blocker date and time   History of Anesthesia Complications Negative for: history of anesthetic complications  Airway Mallampati: II  TM Distance: >3 FB     Dental no notable dental hx.    Pulmonary neg COPD, former smoker   breath sounds clear to auscultation       Cardiovascular hypertension, (-) CAD, (-) Past MI, (-) Cardiac Stents and (-) CABG  Rhythm:Regular Rate:Normal     Neuro/Psych neg Seizures  Neuromuscular disease    GI/Hepatic ,neg GERD  ,,(+) neg Cirrhosis        Endo/Other    Renal/GU Renal disease     Musculoskeletal  (+) Arthritis , Osteoarthritis,    Abdominal   Peds  Hematology   Anesthesia Other Findings   Reproductive/Obstetrics                             Anesthesia Physical Anesthesia Plan  ASA: 2  Anesthesia Plan: Spinal   Post-op Pain Management: Regional block*   Induction: Intravenous  PONV Risk Score and Plan: 1 and Ondansetron  and Propofol  infusion  Airway Management Planned: Nasal Cannula and Natural Airway  Additional Equipment:   Intra-op Plan:   Post-operative Plan:   Informed Consent: I have reviewed the patients History and Physical, chart, labs and discussed the procedure including the risks, benefits and alternatives for the proposed anesthesia with the patient or authorized representative who has indicated his/her understanding and acceptance.     Dental advisory given  Plan Discussed with: CRNA  Anesthesia Plan Comments:         Anesthesia Quick Evaluation

## 2023-12-25 NOTE — Interval H&P Note (Signed)
 History and Physical Interval Note:  12/25/2023 10:40 AM  Troy Marsh  has presented today for surgery, with the diagnosis of OA LEFT KNEE.  The various methods of treatment have been discussed with the patient and family. After consideration of risks, benefits and other options for treatment, the patient has consented to  Procedure(s): ARTHROPLASTY, KNEE, TOTAL (Left) as a surgical intervention.  The patient's history has been reviewed, patient examined, no change in status, stable for surgery.  I have reviewed the patient's chart and labs.  Questions were answered to the patient's satisfaction.     Neville Barbone

## 2023-12-27 ENCOUNTER — Encounter (HOSPITAL_COMMUNITY): Payer: Self-pay | Admitting: Orthopedic Surgery

## 2024-01-15 ENCOUNTER — Telehealth: Payer: Self-pay | Admitting: Internal Medicine

## 2024-01-15 ENCOUNTER — Encounter: Payer: Self-pay | Admitting: Internal Medicine

## 2024-01-15 NOTE — Telephone Encounter (Unsigned)
 Copied from CRM [id number]. Topic: Clinical - Medication Refill >> Jan 15, 2024  3:45 PM Deaijah H wrote: Medication: HYDROcodone -acetaminophen  (NORCO) 7.5-325 MG tablet  Has the patient contacted their pharmacy? Yes (Agent: If no, request that the patient contact the pharmacy for the refill. If patient does not wish to contact the pharmacy document the reason why and proceed with request.) (Agent: If yes, when and what did the pharmacy advise?)  This is the patient's preferred pharmacy:  [facility] [place], [place] - [address] [facility] [address] [place] [address] Phone: [phone] Fax: [phone]   Is this the correct pharmacy for this prescription? Yes If no, delete pharmacy and type the correct one.   Has the prescription been filled recently? Yes, 12/19/23  Is the patient out of the medication? No  Has the patient been seen for an appointment in the last year OR does the patient have an upcoming appointment? Yes  Can we respond through MyChart? Yes  Agent: Please be advised that Rx refills may take up to 3 business days. We ask that you follow-up with your pharmacy.

## 2024-01-17 MED ORDER — HYDROCODONE-ACETAMINOPHEN 7.5-325 MG PO TABS: 1.0000 | ORAL_TABLET | Freq: Four times a day (QID) | ORAL | 0 refills | Status: DC | PRN

## 2024-01-26 ENCOUNTER — Other Ambulatory Visit: Payer: Self-pay | Admitting: Internal Medicine

## 2024-01-29 NOTE — Telephone Encounter (Unsigned)
 Copied from CRM [id number]. Topic: Clinical - Medication Refill >> Jan 29, 2024 11:42 AM Jasmin G wrote: Medication: tadalafil  (CIALIS ) 5 MG tablet  Has the patient contacted their pharmacy? Yes (Agent: If no, request that the patient contact the pharmacy for the refill. If patient does not wish to contact the pharmacy document the reason why and proceed with request.) (Agent: If yes, when and what did the pharmacy advise?)  Pt. Gets medication shipped from [facility] ([facility]) Phone Number: [phone]  Is this the correct pharmacy for this prescription? Yes If no, delete pharmacy and type the correct one.   Has the prescription been filled recently? No  Is the patient out of the medication? No  Has the patient been seen for an appointment in the last year OR does the patient have an upcoming appointment? Yes  Can we respond through MyChart? Yes  Agent: Please be advised that Rx refills may take up to 3 business days. We ask that you follow-up with your pharmacy.

## 2024-02-02 ENCOUNTER — Other Ambulatory Visit: Payer: Self-pay | Admitting: Internal Medicine

## 2024-02-02 MED ORDER — TADALAFIL 5 MG PO TABS: ORAL_TABLET | ORAL | 3 refills | Status: DC

## 2024-02-08 ENCOUNTER — Other Ambulatory Visit: Payer: Self-pay

## 2024-02-08 MED ORDER — TADALAFIL 5 MG PO TABS: ORAL_TABLET | ORAL | 3 refills | Status: DC

## 2024-02-11 NOTE — Telephone Encounter (Signed)
 Pts rx has been faxed over to pharmacy.

## 2024-02-15 ENCOUNTER — Other Ambulatory Visit: Payer: Self-pay

## 2024-02-15 MED ORDER — DILTIAZEM GEL 2 %: 1.0000 | Freq: Every day | CUTANEOUS | 0 refills | Status: AC

## 2024-02-15 NOTE — Telephone Encounter (Signed)
 Received refill request for patient's Diltiazem  2% ointment. Per Harlene Mail, PA okay to refill. Rx faxed to [facility].

## 2024-02-27 ENCOUNTER — Other Ambulatory Visit: Payer: Self-pay | Admitting: Orthopedic Surgery

## 2024-02-27 DIAGNOSIS — M5416 Radiculopathy, lumbar region: Secondary | ICD-10-CM

## 2024-03-04 ENCOUNTER — Other Ambulatory Visit: Payer: Self-pay | Admitting: Orthopedic Surgery

## 2024-03-04 ENCOUNTER — Inpatient Hospital Stay
Admission: RE | Admit: 2024-03-04 | Discharge: 2024-03-04 | Discharge status: Home / Self Care | Source: Ambulatory Visit | Attending: Orthopedic Surgery | Admitting: Orthopedic Surgery

## 2024-03-04 DIAGNOSIS — M5416 Radiculopathy, lumbar region: Secondary | ICD-10-CM

## 2024-03-05 ENCOUNTER — Other Ambulatory Visit: Payer: Self-pay | Admitting: Internal Medicine

## 2024-03-06 NOTE — Telephone Encounter (Signed)
 Copied from CRM [id number]. Topic: Clinical - Medication Refill >> Mar 06, 2024  3:31 PM Sasha M wrote: Medication: testosterone  enanthate (DELATESTRYL ) 200 MG/ML injection  Has the patient contacted their pharmacy? Yes (Agent: If no, request that the patient contact the pharmacy for the refill. If patient does not wish to contact the pharmacy document the reason why and proceed with request.) (Agent: If yes, when and what did the pharmacy advise?) Pharmacy called in refill  This is the patient's preferred pharmacy:   [facility] - [place], [place] - [address] [place] [address] Phone: [phone] Fax: [phone]   Is this the correct pharmacy for this prescription? Yes If no, delete pharmacy and type the correct one.   Has the prescription been filled recently? No  Is the patient out of the medication? Yes  Has the patient been seen for an appointment in the last year OR does the patient have an upcoming appointment? Yes  Can we respond through MyChart? No  Agent: Please be advised that Rx refills may take up to 3 business days. We ask that you follow-up with your pharmacy.

## 2024-03-07 ENCOUNTER — Other Ambulatory Visit: Payer: Self-pay | Admitting: Internal Medicine

## 2024-03-07 ENCOUNTER — Encounter: Payer: Self-pay | Admitting: Internal Medicine

## 2024-03-07 NOTE — Progress Notes (Signed)
 Surgical Instructions   Your procedure is scheduled on Wednesday March 12, 2024. Report to [facility] Main Entrance A at 5:30 A.M., then check in with the Admitting office. Any questions or running late day of surgery: call [phone]  Questions prior to your surgery date: call [phone], Monday-Friday, 8am-4pm. If you experience any cold or flu symptoms such as cough, fever, chills, shortness of breath, etc. between now and your scheduled surgery, please notify us  at the above number.     Remember:  Do not eat after midnight the night before your surgery  You may drink clear liquids until 4:30 the morning of your surgery.   Clear liquids allowed are: Water , Non-Citrus Juices (without pulp), Carbonated Beverages, Clear Tea (no milk, honey, etc.), Black Coffee Only (NO MILK, CREAM OR POWDERED CREAMER of any kind), and Gatorade.  Patient Instructions  The night before surgery:  No food after midnight. ONLY clear liquids after midnight  The day of surgery (if you do NOT have diabetes):  Drink ONE (1) Pre-Surgery Clear Ensure by 4:30 the morning of surgery. Drink in one sitting. Do not sip.  This drink was given to you during your hospital  pre-op appointment visit.  Nothing else to drink after completing the  Pre-Surgery Clear Ensure.         If you have questions, please contact your surgeon's office.    Take these medicines the morning of surgery with A SIP OF WATER   emtricitabine -rilpivir-tenofovir  AF (ODEFSEY )  gabapentin  (NEURONTIN )  montelukast  (SINGULAIR )  RESTASIS  0.05 % ophthalmic emulsion    May take these medicines IF NEEDED: acetaminophen  (TYLENOL )  Carboxymethylcellulose Sodium (THERATEARS OP)  HYDROcodone -acetaminophen  (NORCO)  methocarbamol  (ROBAXIN )   Follow your surgeon's instructions on when to stop Asprin.  If no instructions were given by your surgeon then you will need to call the office to get those instructions.    One week prior to surgery,  STOP taking any Aleve, Naproxen, Ibuprofen , Motrin , Advil , Goody's, BC's, all herbal medications, fish oil, and non-prescription vitamins.                     Do NOT Smoke (Tobacco/Vaping) for 24 hours prior to your procedure.  If you use a CPAP at night, you may bring your mask/headgear for your overnight stay.   You will be asked to remove any contacts, glasses, piercing's, hearing aid's, dentures/partials prior to surgery. Please bring cases for these items if needed.    Patients discharged the day of surgery will not be allowed to drive home, and someone needs to stay with them for 24 hours.  SURGICAL WAITING ROOM VISITATION Patients may have no more than 2 support people in the waiting area - these visitors may rotate.   Pre-op nurse will coordinate an appropriate time for 1 ADULT support person, who may not rotate, to accompany patient in pre-op.  Children under the age of 21 must have an adult with them who is not the patient and must remain in the main waiting area with an adult.  If the patient needs to stay at the hospital during part of their recovery, the visitor guidelines for inpatient rooms apply.  Please refer to the [facility] website for the visitor guidelines for any additional information.   If you received a COVID test during your pre-op visit  it is requested that you wear a mask when out in public, stay away from anyone that may not be feeling well and notify your surgeon if  you develop symptoms. If you have been in contact with anyone that has tested positive in the last 10 days please notify you surgeon.      Pre-operative 5 CHG Bathing Instructions   You can play a key role in reducing the risk of infection after surgery. Your skin needs to be as free of germs as possible. You can reduce the number of germs on your skin by washing with CHG (chlorhexidine  gluconate) soap before surgery. CHG is an antiseptic soap that kills germs and continues to kill germs even  after washing.   DO NOT use if you have an allergy to chlorhexidine /CHG or antibacterial soaps. If your skin becomes reddened or irritated, stop using the CHG and notify one of our RNs at [phone].   Please shower with the CHG soap starting 4 days before surgery using the following schedule:     Please keep in mind the following:  You may shave your face at any point before/day of surgery.  Place clean sheets on your bed the day you start using CHG soap. Use a clean washcloth (not used since being washed) for each shower. DO NOT sleep with pets once you start using the CHG.   CHG Shower Instructions:  Wash your face and private area with normal soap. If you choose to wash your hair, wash first with your normal shampoo.  After you use shampoo/soap, rinse your hair and body thoroughly to remove shampoo/soap residue.  Turn the water  OFF and apply about 3 tablespoons (45 ml) of CHG soap to a CLEAN washcloth.  Apply CHG soap ONLY FROM YOUR NECK DOWN TO YOUR TOES (washing for 3-5 minutes)  DO NOT use CHG soap on face, private areas, open wounds, or sores.  Pay special attention to the area where your surgery is being performed.  If you are having back surgery, having someone wash your back for you may be helpful. Wait 2 minutes after CHG soap is applied, then you may rinse off the CHG soap.  Pat dry with a clean towel  Put on clean clothes/pajamas   If you choose to wear lotion, please use ONLY the CHG-compatible lotions that are listed below.  Additional instructions for the day of surgery: DO NOT APPLY any lotions, deodorants or cologne.   Do not bring valuables to the hospital. [facility] is not responsible for any belongings/valuables. Do not wear jewelry Put on clean/comfortable clothes.  Please brush your teeth.  Ask your nurse before applying any prescription medications to the skin.     CHG Compatible Lotions   Aveeno Moisturizing lotion  Cetaphil Moisturizing Cream   Cetaphil Moisturizing Lotion  Clairol Herbal Essence Moisturizing Lotion, Dry Skin  Clairol Herbal Essence Moisturizing Lotion, Extra Dry Skin  Clairol Herbal Essence Moisturizing Lotion, Normal Skin  Curel Age Defying Therapeutic Moisturizing Lotion with Alpha Hydroxy  Curel Extreme Care Body Lotion  Curel Soothing Hands Moisturizing Hand Lotion  Curel Therapeutic Moisturizing Cream, Fragrance-Free  Curel Therapeutic Moisturizing Lotion, Fragrance-Free  Curel Therapeutic Moisturizing Lotion, Original Formula  Eucerin Daily Replenishing Lotion  Eucerin Dry Skin Therapy Plus Alpha Hydroxy Crme  Eucerin Dry Skin Therapy Plus Alpha Hydroxy Lotion  Eucerin Original Crme  Eucerin Original Lotion  Eucerin Plus Crme Eucerin Plus Lotion  Eucerin TriLipid Replenishing Lotion  Keri Anti-Bacterial Hand Lotion  Keri Deep Conditioning Original Lotion Dry Skin Formula Softly Scented  Keri Deep Conditioning Original Lotion, Fragrance Free Sensitive Skin Formula  Keri Lotion Fast Absorbing Fragrance Free Sensitive Skin  Formula  Keri Lotion Fast Absorbing Softly Scented Dry Skin Formula  Keri Original Lotion  Keri Skin Renewal Lotion Keri Silky Smooth Lotion  Keri Silky Smooth Sensitive Skin Lotion  Nivea Body Creamy Conditioning Oil  Nivea Body Extra Enriched Lotion  Nivea Body Original Lotion  Nivea Body Sheer Moisturizing Lotion Nivea Crme  Nivea Skin Firming Lotion  NutraDerm 30 Skin Lotion  NutraDerm Skin Lotion  NutraDerm Therapeutic Skin Cream  NutraDerm Therapeutic Skin Lotion  ProShield Protective Hand Cream  Provon moisturizing lotion  Please read over the following fact sheets that you were given.

## 2024-03-09 ENCOUNTER — Other Ambulatory Visit: Payer: Self-pay | Admitting: Internal Medicine

## 2024-03-09 MED ORDER — HYDROCODONE-ACETAMINOPHEN 7.5-325 MG PO TABS: 1.0000 | ORAL_TABLET | Freq: Four times a day (QID) | ORAL | 0 refills | Status: DC | PRN

## 2024-03-10 ENCOUNTER — Other Ambulatory Visit: Payer: Self-pay

## 2024-03-10 ENCOUNTER — Encounter (HOSPITAL_COMMUNITY): Payer: Self-pay

## 2024-03-10 ENCOUNTER — Encounter (HOSPITAL_COMMUNITY)
Admission: RE | Admit: 2024-03-10 | Discharge: 2024-03-10 | Disposition: A | Discharge status: Home / Self Care | Source: Ambulatory Visit | Attending: Orthopedic Surgery | Admitting: Orthopedic Surgery

## 2024-03-10 VITALS — BP 155/79 | HR 69 | Temp 97.7°F | Resp 18 | Ht 70.0 in | Wt 133.6 lb

## 2024-03-10 DIAGNOSIS — E291 Testicular hypofunction: Secondary | ICD-10-CM | POA: Insufficient documentation

## 2024-03-10 DIAGNOSIS — Z21 Asymptomatic human immunodeficiency virus [HIV] infection status: Secondary | ICD-10-CM | POA: Insufficient documentation

## 2024-03-10 DIAGNOSIS — Z01812 Encounter for preprocedural laboratory examination: Secondary | ICD-10-CM | POA: Insufficient documentation

## 2024-03-10 DIAGNOSIS — D751 Secondary polycythemia: Secondary | ICD-10-CM | POA: Insufficient documentation

## 2024-03-10 DIAGNOSIS — Z01818 Encounter for other preprocedural examination: Secondary | ICD-10-CM

## 2024-03-10 LAB — TYPE AND SCREEN
ABO/RH(D): A POS
Antibody Screen: NEGATIVE

## 2024-03-10 LAB — SURGICAL PCR SCREEN
MRSA, PCR: POSITIVE — AB
Staphylococcus aureus: POSITIVE — AB

## 2024-03-10 LAB — BASIC METABOLIC PANEL WITH GFR
Anion gap: 7 (ref 5–15)
BUN: 8 mg/dL (ref 8–23)
CO2: 32 mmol/L (ref 22–32)
Calcium: 8.9 mg/dL (ref 8.9–10.3)
Chloride: 95 mmol/L — ABNORMAL LOW (ref 98–111)
Creatinine, Ser: 0.97 mg/dL (ref 0.61–1.24)
GFR, Estimated: >60 mL/min (ref >60)
Glucose, Bld: 93 mg/dL (ref 70–99)
Potassium: 3.6 mmol/L (ref 3.5–5.1)
Sodium: 134 mmol/L — ABNORMAL LOW (ref 135–145)

## 2024-03-10 LAB — CBC
HCT: 44.7 % (ref 39.0–52.0)
Hemoglobin: 14.8 g/dL (ref 13.0–17.0)
MCH: 32.9 pg (ref 26.0–34.0)
MCHC: 33.1 g/dL (ref 30.0–36.0)
MCV: 99.3 fL (ref 80.0–100.0)
Platelets: 222 K/uL (ref 150–400)
RBC: 4.5 MIL/uL (ref 4.22–5.81)
RDW: 12.9 % (ref 11.5–15.5)
WBC: 5.2 K/uL (ref 4.0–10.5)
nRBC: 0 % (ref 0.0–0.2)

## 2024-03-10 NOTE — Progress Notes (Signed)
 PCP - Karlynn Plotnikov,MD Cardiologist - denies  PPM/ICD - denies Device Orders -  Rep Notified -   Chest x-ray - na EKG - 12/13/23 Stress Test - denies ECHO - denies Cardiac Cath - denies  Sleep Study - denies CPAP -   Fasting Blood Sugar - na Checks Blood Sugar _____ times a day  Last dose of GLP1 agonist-  na GLP1 instructions:   Blood Thinner Instructions:na Aspirin Instructions:pt states his last dose of aspirin was 03/05/24  ERAS Protcol -clear liquids until 0430 PRE-SURGERY Ensure or G2- Ensure  COVID TEST- na   Anesthesia review: no  Patient denies shortness of breath, fever, cough and chest pain at PAT appointment   All instructions explained to the patient, with a verbal understanding of the material. Patient agrees to go over the instructions while at home for a better understanding. The opportunity to ask questions was provided.

## 2024-03-12 ENCOUNTER — Other Ambulatory Visit: Payer: Self-pay

## 2024-03-12 ENCOUNTER — Inpatient Hospital Stay (HOSPITAL_COMMUNITY)

## 2024-03-12 ENCOUNTER — Encounter (HOSPITAL_COMMUNITY)
Admission: RE | Disposition: A | Discharge status: Home / Self Care | Payer: Self-pay | Source: Home / Self Care | Attending: Orthopedic Surgery

## 2024-03-12 ENCOUNTER — Encounter (HOSPITAL_COMMUNITY): Payer: Self-pay | Admitting: Orthopedic Surgery

## 2024-03-12 ENCOUNTER — Inpatient Hospital Stay (HOSPITAL_COMMUNITY): Admitting: Anesthesiology

## 2024-03-12 ENCOUNTER — Inpatient Hospital Stay (HOSPITAL_COMMUNITY)
Admission: RE | Admit: 2024-03-12 | Discharge: 2024-03-14 | DRG: 402 | Disposition: A | Discharge status: Home / Self Care | Attending: Orthopedic Surgery | Admitting: Orthopedic Surgery

## 2024-03-12 DIAGNOSIS — Z83438 Family history of other disorder of lipoprotein metabolism and other lipidemia: Secondary | ICD-10-CM | POA: Diagnosis not present

## 2024-03-12 DIAGNOSIS — Z7983 Long term (current) use of bisphosphonates: Secondary | ICD-10-CM | POA: Diagnosis not present

## 2024-03-12 DIAGNOSIS — Z7982 Long term (current) use of aspirin: Secondary | ICD-10-CM | POA: Diagnosis not present

## 2024-03-12 DIAGNOSIS — M5416 Radiculopathy, lumbar region: Principal | ICD-10-CM | POA: Diagnosis present

## 2024-03-12 DIAGNOSIS — M48061 Spinal stenosis, lumbar region without neurogenic claudication: Secondary | ICD-10-CM | POA: Diagnosis present

## 2024-03-12 DIAGNOSIS — K589 Irritable bowel syndrome without diarrhea: Secondary | ICD-10-CM | POA: Diagnosis present

## 2024-03-12 DIAGNOSIS — E291 Testicular hypofunction: Secondary | ICD-10-CM | POA: Diagnosis present

## 2024-03-12 DIAGNOSIS — I700 Atherosclerosis of aorta: Secondary | ICD-10-CM | POA: Diagnosis not present

## 2024-03-12 DIAGNOSIS — M1990 Unspecified osteoarthritis, unspecified site: Secondary | ICD-10-CM | POA: Diagnosis present

## 2024-03-12 DIAGNOSIS — Z87891 Personal history of nicotine dependence: Secondary | ICD-10-CM

## 2024-03-12 DIAGNOSIS — Z96652 Presence of left artificial knee joint: Secondary | ICD-10-CM | POA: Diagnosis present

## 2024-03-12 DIAGNOSIS — I10 Essential (primary) hypertension: Secondary | ICD-10-CM | POA: Diagnosis present

## 2024-03-12 DIAGNOSIS — B20 Human immunodeficiency virus [HIV] disease: Secondary | ICD-10-CM | POA: Diagnosis present

## 2024-03-12 DIAGNOSIS — N400 Enlarged prostate without lower urinary tract symptoms: Secondary | ICD-10-CM | POA: Diagnosis present

## 2024-03-12 DIAGNOSIS — Z8249 Family history of ischemic heart disease and other diseases of the circulatory system: Secondary | ICD-10-CM

## 2024-03-12 DIAGNOSIS — Z85828 Personal history of other malignant neoplasm of skin: Secondary | ICD-10-CM | POA: Diagnosis not present

## 2024-03-12 DIAGNOSIS — Z01818 Encounter for other preprocedural examination: Secondary | ICD-10-CM

## 2024-03-12 DIAGNOSIS — Z79890 Hormone replacement therapy (postmenopausal): Secondary | ICD-10-CM

## 2024-03-12 DIAGNOSIS — Z79899 Other long term (current) drug therapy: Secondary | ICD-10-CM

## 2024-03-12 DIAGNOSIS — K5903 Drug induced constipation: Secondary | ICD-10-CM | POA: Diagnosis present

## 2024-03-12 DIAGNOSIS — M79605 Pain in left leg: Secondary | ICD-10-CM | POA: Diagnosis present

## 2024-03-12 DIAGNOSIS — E785 Hyperlipidemia, unspecified: Secondary | ICD-10-CM | POA: Diagnosis present

## 2024-03-12 HISTORY — PX: ANTERIOR LAT LUMBAR FUSION: SHX1168

## 2024-03-12 SURGERY — ANTERIOR LATERAL LUMBAR FUSION 1 LEVEL
Anesthesia: General | Laterality: Right

## 2024-03-12 MED ORDER — ACETAMINOPHEN 500 MG PO TABS
1000.0000 mg | ORAL_TABLET | Freq: Once | ORAL | Status: AC
  Administered 2024-03-12: 500 mg via ORAL

## 2024-03-12 MED ORDER — ACETAMINOPHEN 500 MG PO TABS
ORAL_TABLET | ORAL | Status: AC
  Filled 2024-03-12: qty 1

## 2024-03-12 MED ORDER — MIDAZOLAM HCL 2 MG/2ML IJ SOLN
INTRAMUSCULAR | Status: DC | PRN
  Administered 2024-03-12: 2 mg via INTRAVENOUS

## 2024-03-12 MED ORDER — ADULT MULTIVITAMIN W/MINERALS CH
1.0000 | ORAL_TABLET | Freq: Every day | ORAL | Status: DC
  Administered 2024-03-12 – 2024-03-14 (×2): 1 via ORAL
  Filled 2024-03-12 (×2): qty 1

## 2024-03-12 MED ORDER — ROCURONIUM BROMIDE 10 MG/ML (PF) SYRINGE
PREFILLED_SYRINGE | INTRAVENOUS | Status: AC
Start: 2024-03-12 — End: 2024-03-12
  Filled 2024-03-12: qty 10

## 2024-03-12 MED ORDER — ORAL CARE MOUTH RINSE: 15.0000 mL | Freq: Once | OROMUCOSAL | Status: AC

## 2024-03-12 MED ORDER — HYDROCHLOROTHIAZIDE 25 MG PO TABS
50.0000 mg | ORAL_TABLET | Freq: Every day | ORAL | Status: DC
  Administered 2024-03-12 – 2024-03-14 (×2): 50 mg via ORAL
  Filled 2024-03-12 (×2): qty 2

## 2024-03-12 MED ORDER — ONDANSETRON HCL 4 MG/2ML IJ SOLN
INTRAMUSCULAR | Status: DC | PRN
  Administered 2024-03-12: 4 mg via INTRAVENOUS

## 2024-03-12 MED ORDER — BUPIVACAINE-EPINEPHRINE (PF) 0.25% -1:200000 IJ SOLN
INTRAMUSCULAR | Status: AC
Start: 2024-03-12 — End: 2024-03-12
  Filled 2024-03-12: qty 30

## 2024-03-12 MED ORDER — RAMELTEON 8 MG PO TABS
8.0000 mg | ORAL_TABLET | Freq: Every day | ORAL | Status: DC
  Administered 2024-03-12 – 2024-03-13 (×2): 8 mg via ORAL
  Filled 2024-03-12 (×2): qty 1

## 2024-03-12 MED ORDER — MIDAZOLAM HCL 2 MG/2ML IJ SOLN
INTRAMUSCULAR | Status: AC
  Filled 2024-03-12: qty 2

## 2024-03-12 MED ORDER — SODIUM CHLORIDE 0.9% FLUSH
3.0000 mL | Freq: Two times a day (BID) | INTRAVENOUS | Status: DC
  Administered 2024-03-12 – 2024-03-13 (×2): 3 mL via INTRAVENOUS

## 2024-03-12 MED ORDER — POTASSIUM CHLORIDE CRYS ER 10 MEQ PO TBCR
10.0000 meq | EXTENDED_RELEASE_TABLET | Freq: Every day | ORAL | Status: DC
  Administered 2024-03-12 – 2024-03-14 (×2): 10 meq via ORAL
  Filled 2024-03-12 (×2): qty 1

## 2024-03-12 MED ORDER — PROPOFOL 10 MG/ML IV BOLUS
INTRAVENOUS | Status: DC | PRN
  Administered 2024-03-12: 150 mg via INTRAVENOUS
  Administered 2024-03-12: 80 ug/kg/min via INTRAVENOUS

## 2024-03-12 MED ORDER — VANCOMYCIN HCL IN DEXTROSE 1-5 GM/200ML-% IV SOLN
1000.0000 mg | INTRAVENOUS | Status: AC
  Administered 2024-03-13: 1000 mg via INTRAVENOUS
  Filled 2024-03-12: qty 200

## 2024-03-12 MED ORDER — ONDANSETRON HCL 4 MG/2ML IJ SOLN
INTRAMUSCULAR | Status: AC
  Filled 2024-03-12: qty 2

## 2024-03-12 MED ORDER — HYDROCORTISONE ACETATE 25 MG RE SUPP
25.0000 mg | Freq: Every day | RECTAL | Status: DC
  Administered 2024-03-14: 25 mg via RECTAL
  Filled 2024-03-12 (×3): qty 1

## 2024-03-12 MED ORDER — OXYCODONE-ACETAMINOPHEN 5-325 MG PO TABS
1.0000 | ORAL_TABLET | ORAL | Status: DC | PRN
  Administered 2024-03-12 – 2024-03-14 (×6): 2 via ORAL
  Filled 2024-03-12 (×6): qty 2

## 2024-03-12 MED ORDER — SUCCINYLCHOLINE CHLORIDE 200 MG/10ML IV SOSY
PREFILLED_SYRINGE | INTRAVENOUS | Status: AC
  Filled 2024-03-12: qty 10

## 2024-03-12 MED ORDER — DILTIAZEM GEL 2 %: 1.0000 | Freq: Every day | CUTANEOUS | Status: DC

## 2024-03-12 MED ORDER — ONDANSETRON HCL 4 MG/2ML IJ SOLN: 4.0000 mg | Freq: Four times a day (QID) | INTRAMUSCULAR | Status: DC | PRN

## 2024-03-12 MED ORDER — ACETAMINOPHEN 325 MG PO TABS
650.0000 mg | ORAL_TABLET | ORAL | Status: DC | PRN
Start: 2024-03-12 — End: 2024-03-14
  Administered 2024-03-12: 650 mg via ORAL
  Filled 2024-03-12: qty 2

## 2024-03-12 MED ORDER — PHENOL 1.4 % MT LIQD
1.0000 | OROMUCOSAL | Status: DC | PRN
Start: 2024-03-12 — End: 2024-03-14

## 2024-03-12 MED ORDER — ENSURE PRE-SURGERY PO LIQD
296.0000 mL | Freq: Once | ORAL | Status: AC
  Administered 2024-03-13: 296 mL via ORAL
  Filled 2024-03-12: qty 296

## 2024-03-12 MED ORDER — SODIUM CHLORIDE 0.9 % IV SOLN: 250.0000 mL | INTRAVENOUS | Status: AC

## 2024-03-12 MED ORDER — CYCLOSPORINE 0.05 % OP EMUL
1.0000 [drp] | Freq: Every day | OPHTHALMIC | Status: DC
  Administered 2024-03-12 – 2024-03-14 (×3): 1 [drp] via OPHTHALMIC
  Filled 2024-03-12 (×3): qty 1

## 2024-03-12 MED ORDER — PROPOFOL 10 MG/ML IV BOLUS
INTRAVENOUS | Status: AC
  Filled 2024-03-12: qty 20

## 2024-03-12 MED ORDER — ACETAMINOPHEN 650 MG RE SUPP: 650.0000 mg | RECTAL | Status: DC | PRN

## 2024-03-12 MED ORDER — FENTANYL CITRATE (PF) 100 MCG/2ML IJ SOLN
25.0000 ug | INTRAMUSCULAR | Status: DC | PRN
  Administered 2024-03-12 (×2): 50 ug via INTRAVENOUS

## 2024-03-12 MED ORDER — LIDOCAINE 2% (20 MG/ML) 5 ML SYRINGE
INTRAMUSCULAR | Status: AC
Start: 2024-03-12 — End: 2024-03-12
  Filled 2024-03-12: qty 5

## 2024-03-12 MED ORDER — DEXAMETHASONE SODIUM PHOSPHATE 10 MG/ML IJ SOLN
INTRAMUSCULAR | Status: DC | PRN
  Administered 2024-03-12: 10 mg via INTRAVENOUS

## 2024-03-12 MED ORDER — POVIDONE-IODINE 7.5 % EX SOLN
Freq: Once | CUTANEOUS | Status: DC
  Filled 2024-03-12 (×2): qty 118

## 2024-03-12 MED ORDER — CEFAZOLIN SODIUM-DEXTROSE 2-4 GM/100ML-% IV SOLN
INTRAVENOUS | Status: AC
  Filled 2024-03-12: qty 100

## 2024-03-12 MED ORDER — MELATONIN 5 MG PO TABS
5.0000 mg | ORAL_TABLET | Freq: Every day | ORAL | Status: DC
  Administered 2024-03-12 – 2024-03-13 (×2): 5 mg via ORAL
  Filled 2024-03-12 (×2): qty 1

## 2024-03-12 MED ORDER — GABAPENTIN 300 MG PO CAPS
300.0000 mg | ORAL_CAPSULE | Freq: Three times a day (TID) | ORAL | Status: DC
  Administered 2024-03-12 – 2024-03-14 (×4): 300 mg via ORAL
  Filled 2024-03-12 (×4): qty 1

## 2024-03-12 MED ORDER — ACETAMINOPHEN 500 MG PO TABS
1000.0000 mg | ORAL_TABLET | Freq: Once | ORAL | Status: AC
  Administered 2024-03-13: 1000 mg via ORAL
  Filled 2024-03-12: qty 2

## 2024-03-12 MED ORDER — CEFAZOLIN SODIUM-DEXTROSE 2-4 GM/100ML-% IV SOLN
2.0000 g | INTRAVENOUS | Status: AC
  Administered 2024-03-12: 2 g via INTRAVENOUS

## 2024-03-12 MED ORDER — BUPIVACAINE-EPINEPHRINE 0.25% -1:200000 IJ SOLN
INTRAMUSCULAR | Status: DC | PRN
Start: 2024-03-12 — End: 2024-03-12
  Administered 2024-03-12: 9 mL

## 2024-03-12 MED ORDER — AMISULPRIDE (ANTIEMETIC) 5 MG/2ML IV SOLN: 10.0000 mg | Freq: Once | INTRAVENOUS | Status: DC | PRN

## 2024-03-12 MED ORDER — LINACLOTIDE 145 MCG PO CAPS
290.0000 ug | ORAL_CAPSULE | Freq: Every day | ORAL | Status: DC
  Administered 2024-03-14: 290 ug via ORAL
  Filled 2024-03-12: qty 2

## 2024-03-12 MED ORDER — ATORVASTATIN CALCIUM 40 MG PO TABS
40.0000 mg | ORAL_TABLET | Freq: Every day | ORAL | Status: DC
  Administered 2024-03-12 – 2024-03-13 (×2): 40 mg via ORAL
  Filled 2024-03-12 (×2): qty 1

## 2024-03-12 MED ORDER — PHENYLEPHRINE HCL-NACL 20-0.9 MG/250ML-% IV SOLN
INTRAVENOUS | Status: DC | PRN
  Administered 2024-03-12: 40 ug/min via INTRAVENOUS

## 2024-03-12 MED ORDER — METHOCARBAMOL 1000 MG/10ML IJ SOLN: 500.0000 mg | Freq: Four times a day (QID) | INTRAMUSCULAR | Status: DC | PRN

## 2024-03-12 MED ORDER — LIDOCAINE 2% (20 MG/ML) 5 ML SYRINGE
INTRAMUSCULAR | Status: DC | PRN
  Administered 2024-03-12: 60 mg via INTRAVENOUS

## 2024-03-12 MED ORDER — 0.9 % SODIUM CHLORIDE (POUR BTL) OPTIME
TOPICAL | Status: DC | PRN
  Administered 2024-03-12: 1000 mL

## 2024-03-12 MED ORDER — LORATADINE 10 MG PO TABS
10.0000 mg | ORAL_TABLET | Freq: Every day | ORAL | Status: DC
  Administered 2024-03-12 – 2024-03-14 (×2): 10 mg via ORAL
  Filled 2024-03-12 (×2): qty 1

## 2024-03-12 MED ORDER — THROMBIN 20000 UNITS EX SOLR
CUTANEOUS | Status: DC | PRN
  Administered 2024-03-12: 20 mL via TOPICAL

## 2024-03-12 MED ORDER — SODIUM CHLORIDE 0.9% FLUSH
3.0000 mL | INTRAVENOUS | Status: DC | PRN
Start: 2024-03-12 — End: 2024-03-14

## 2024-03-12 MED ORDER — MENTHOL 3 MG MT LOZG: 1.0000 | LOZENGE | OROMUCOSAL | Status: DC | PRN

## 2024-03-12 MED ORDER — CALCIUM CARBONATE ANTACID 750 MG PO CHEW: 750.0000 mg | CHEWABLE_TABLET | Freq: Every day | ORAL | Status: DC | PRN

## 2024-03-12 MED ORDER — CEFAZOLIN SODIUM-DEXTROSE 2-4 GM/100ML-% IV SOLN
2.0000 g | Freq: Three times a day (TID) | INTRAVENOUS | Status: AC
  Administered 2024-03-12 (×2): 2 g via INTRAVENOUS
  Filled 2024-03-12 (×2): qty 100

## 2024-03-12 MED ORDER — SUCCINYLCHOLINE CHLORIDE 200 MG/10ML IV SOSY
PREFILLED_SYRINGE | INTRAVENOUS | Status: DC | PRN
Start: 2024-03-12 — End: 2024-03-12
  Administered 2024-03-12: 120 mg via INTRAVENOUS

## 2024-03-12 MED ORDER — CARBOXYMETHYLCELLULOSE SODIUM 0.25 % OP SOLN: OPHTHALMIC | Status: DC | PRN

## 2024-03-12 MED ORDER — GABAPENTIN 300 MG PO CAPS
300.0000 mg | ORAL_CAPSULE | Freq: Once | ORAL | Status: AC
  Administered 2024-03-13: 300 mg via ORAL
  Filled 2024-03-12: qty 1

## 2024-03-12 MED ORDER — POVIDONE-IODINE 7.5 % EX SOLN
Freq: Once | CUTANEOUS | Status: DC
  Filled 2024-03-12: qty 118

## 2024-03-12 MED ORDER — VANCOMYCIN HCL IN DEXTROSE 1-5 GM/200ML-% IV SOLN: 1000.0000 mg | INTRAVENOUS | Status: AC

## 2024-03-12 MED ORDER — BUPIVACAINE LIPOSOME 1.3 % IJ SUSP
INTRAMUSCULAR | Status: AC
  Filled 2024-03-12: qty 20

## 2024-03-12 MED ORDER — LACTATED RINGERS IV SOLN: INTRAVENOUS | Status: DC

## 2024-03-12 MED ORDER — CALCIUM CARBONATE ANTACID 500 MG PO CHEW: 1.0000 | CHEWABLE_TABLET | Freq: Every day | ORAL | Status: DC | PRN

## 2024-03-12 MED ORDER — FENTANYL CITRATE (PF) 100 MCG/2ML IJ SOLN
INTRAMUSCULAR | Status: AC
  Filled 2024-03-12: qty 2

## 2024-03-12 MED ORDER — CEFAZOLIN SODIUM-DEXTROSE 2-4 GM/100ML-% IV SOLN
2.0000 g | INTRAVENOUS | Status: AC
  Administered 2024-03-13: 1 g via INTRAVENOUS
  Filled 2024-03-12: qty 100

## 2024-03-12 MED ORDER — POLYVINYL ALCOHOL 1.4 % OP SOLN: 1.0000 [drp] | OPHTHALMIC | Status: DC | PRN

## 2024-03-12 MED ORDER — FENTANYL CITRATE (PF) 250 MCG/5ML IJ SOLN
INTRAMUSCULAR | Status: DC | PRN
  Administered 2024-03-12: 100 ug via INTRAVENOUS
  Administered 2024-03-12: 50 ug via INTRAVENOUS

## 2024-03-12 MED ORDER — METHOCARBAMOL 500 MG PO TABS
500.0000 mg | ORAL_TABLET | Freq: Four times a day (QID) | ORAL | Status: DC | PRN
  Administered 2024-03-12 – 2024-03-14 (×5): 500 mg via ORAL
  Filled 2024-03-12 (×5): qty 1

## 2024-03-12 MED ORDER — MONTELUKAST SODIUM 10 MG PO TABS
10.0000 mg | ORAL_TABLET | Freq: Every day | ORAL | Status: DC
  Administered 2024-03-12 – 2024-03-13 (×2): 10 mg via ORAL
  Filled 2024-03-12 (×2): qty 1

## 2024-03-12 MED ORDER — ONDANSETRON HCL 4 MG PO TABS: 4.0000 mg | ORAL_TABLET | Freq: Four times a day (QID) | ORAL | Status: DC | PRN

## 2024-03-12 MED ORDER — CHLORHEXIDINE GLUCONATE 0.12 % MT SOLN
15.0000 mL | Freq: Once | OROMUCOSAL | Status: AC
  Administered 2024-03-12: 15 mL via OROMUCOSAL
  Filled 2024-03-12: qty 15

## 2024-03-12 MED ORDER — POTASSIUM CHLORIDE IN NACL 20-0.9 MEQ/L-% IV SOLN
INTRAVENOUS | Status: DC
  Filled 2024-03-12: qty 1000

## 2024-03-12 MED ORDER — ZOLPIDEM TARTRATE 5 MG PO TABS: 5.0000 mg | ORAL_TABLET | Freq: Every evening | ORAL | Status: DC | PRN

## 2024-03-12 MED ORDER — THROMBIN 20000 UNITS EX SOLR
CUTANEOUS | Status: AC
  Filled 2024-03-12: qty 20000

## 2024-03-12 MED ORDER — FENTANYL CITRATE (PF) 250 MCG/5ML IJ SOLN
INTRAMUSCULAR | Status: AC
  Filled 2024-03-12: qty 5

## 2024-03-12 MED ORDER — DEXAMETHASONE SODIUM PHOSPHATE 10 MG/ML IJ SOLN
INTRAMUSCULAR | Status: AC
Start: 2024-03-12 — End: 2024-03-12
  Filled 2024-03-12: qty 1

## 2024-03-12 MED ORDER — MORPHINE SULFATE (PF) 2 MG/ML IV SOLN: 2.0000 mg | INTRAVENOUS | Status: DC | PRN

## 2024-03-12 MED ORDER — POLYETHYLENE GLYCOL 3350 17 GM/SCOOP PO POWD: 17.0000 g | Freq: Every day | ORAL | Status: DC | PRN

## 2024-03-12 MED ORDER — EMTRICITAB-RILPIVIR-TENOFOV AF 200-25-25 MG PO TABS
1.0000 | ORAL_TABLET | Freq: Every day | ORAL | Status: DC
  Administered 2024-03-14: 1 via ORAL
  Filled 2024-03-12 (×2): qty 1

## 2024-03-12 MED ORDER — VANCOMYCIN HCL IN DEXTROSE 1-5 GM/200ML-% IV SOLN
INTRAVENOUS | Status: AC
  Administered 2024-03-12: 1000 mg via INTRAVENOUS
  Filled 2024-03-12: qty 200

## 2024-03-12 MED ORDER — HYDROCODONE-ACETAMINOPHEN 5-325 MG PO TABS: 1.0000 | ORAL_TABLET | ORAL | Status: DC | PRN

## 2024-03-12 SURGICAL SUPPLY — 64 items
BAG COUNTER SPONGE SURGICOUNT (BAG) ×1 IMPLANT
BLADE BONE MILL MEDIUM (MISCELLANEOUS) IMPLANT
BLADE CLIPPER SURG (BLADE) IMPLANT
BLADE SURG 10 STRL SS (BLADE) ×1 IMPLANT
COVER BACK TABLE 80X110 HD (DRAPES) ×1 IMPLANT
COVER SURGICAL LIGHT HANDLE (MISCELLANEOUS) ×1 IMPLANT
DRAPE C-ARM 42X72 X-RAY (DRAPES) ×1 IMPLANT
DRAPE C-ARMOR (DRAPES) ×1 IMPLANT
DRAPE POUCH INSTRU U-SHP 10X18 (DRAPES) ×1 IMPLANT
DRAPE SURG 17X23 STRL (DRAPES) ×4 IMPLANT
DURAPREP 26ML APPLICATOR (WOUND CARE) ×1 IMPLANT
ELECT BLADE 6.5 EXT (BLADE) ×1 IMPLANT
ELECT CAUTERY BLADE 6.4 (BLADE) ×1 IMPLANT
ELECT NVM5 SURFACE MEP/EMG (ELECTRODE) IMPLANT
ELECTRODE REM PT RTRN 9FT ADLT (ELECTROSURGICAL) ×1 IMPLANT
GAUZE 4X4 16PLY ~~LOC~~+RFID DBL (SPONGE) ×1 IMPLANT
GAUZE SPONGE 4X4 12PLY STRL (GAUZE/BANDAGES/DRESSINGS) IMPLANT
GLOVE BIO SURGEON STRL SZ 6.5 (GLOVE) ×2 IMPLANT
GLOVE BIO SURGEON STRL SZ8 (GLOVE) ×1 IMPLANT
GLOVE BIOGEL PI IND STRL 7.0 (GLOVE) ×1 IMPLANT
GLOVE BIOGEL PI IND STRL 8 (GLOVE) ×1 IMPLANT
GLOVE SURG ENC MOIS LTX SZ6.5 (GLOVE) ×2 IMPLANT
GOWN STRL REUS W/ TWL LRG LVL3 (GOWN DISPOSABLE) ×1 IMPLANT
GOWN STRL REUS W/ TWL XL LVL3 (GOWN DISPOSABLE) ×2 IMPLANT
IV CATH 14GX2 1/4 (CATHETERS) ×1 IMPLANT
KIT BASIN OR (CUSTOM PROCEDURE TRAY) ×1 IMPLANT
KIT DILATOR XLIF 5 (KITS) IMPLANT
KIT SURGICAL ACCESS MAXCESS (KITS) IMPLANT
KIT TURNOVER KIT B (KITS) ×1 IMPLANT
MARKER SKIN DUAL TIP RULER LAB (MISCELLANEOUS) ×1 IMPLANT
MILL BONE PREP (MISCELLANEOUS) IMPLANT
MODULE EMG NDL SSEP NVM5 (NEUROSURGERY SUPPLIES) IMPLANT
MODULE EMG NEEDLE SSEP NVM5 (NEUROSURGERY SUPPLIES) ×1 IMPLANT
NDL HYPO 25GX1X1/2 BEV (NEEDLE) ×1 IMPLANT
NDL MODULE NVMS MEP/EMG (NEEDLE) IMPLANT
NDL SPNL 18GX3.5 QUINCKE PK (NEEDLE) ×1 IMPLANT
NEEDLE HYPO 25GX1X1/2 BEV (NEEDLE) ×1 IMPLANT
NEEDLE MODULE NVMS MEP/EMG (NEEDLE) ×1 IMPLANT
NEEDLE SPNL 18GX3.5 QUINCKE PK (NEEDLE) ×1 IMPLANT
NS IRRIG 1000ML POUR BTL (IV SOLUTION) ×2 IMPLANT
PACK LAMINECTOMY ORTHO (CUSTOM PROCEDURE TRAY) ×1 IMPLANT
PACK UNIVERSAL I (CUSTOM PROCEDURE TRAY) ×1 IMPLANT
PAD ARMBOARD POSITIONER FOAM (MISCELLANEOUS) ×2 IMPLANT
PUTTY BONE DBX 5CC MIX (Putty) IMPLANT
PUTTY DBX 5CC (Putty) IMPLANT
SPACER RISE-L 18X50 7-14MM (Spacer) IMPLANT
SPONGE INTESTINAL PEANUT (DISPOSABLE) ×2 IMPLANT
SPONGE SURGIFOAM ABS GEL 100 (HEMOSTASIS) IMPLANT
SPONGE T-LAP 4X18 ~~LOC~~+RFID (SPONGE) ×1 IMPLANT
STAPLER SKIN PROX 35W (STAPLE) ×1 IMPLANT
STRIP CLOSURE SKIN 1/2X4 (GAUZE/BANDAGES/DRESSINGS) IMPLANT
SUT MNCRL AB 4-0 PS2 18 (SUTURE) IMPLANT
SUT VIC AB 0 CT1 18XCR BRD 8 (SUTURE) IMPLANT
SUT VIC AB 1 CT1 18XCR BRD 8 (SUTURE) IMPLANT
SUT VIC AB 2-0 CT2 18 VCP726D (SUTURE) IMPLANT
SUT VIC AB 3-0 SH 18 (SUTURE) IMPLANT
SYR BULB IRRIG 60ML STRL (SYRINGE) ×1 IMPLANT
SYR CONTROL 10ML LL (SYRINGE) ×1 IMPLANT
TAPE CLOTH 4X10 WHT NS (GAUZE/BANDAGES/DRESSINGS) IMPLANT
TOWEL GREEN STERILE (TOWEL DISPOSABLE) ×1 IMPLANT
TOWEL GREEN STERILE FF (TOWEL DISPOSABLE) ×1 IMPLANT
TRAY FOLEY MTR SLVR 16FR STAT (SET/KITS/TRAYS/PACK) ×1 IMPLANT
WATER STERILE IRR 1000ML POUR (IV SOLUTION) ×1 IMPLANT
YANKAUER SUCT BULB TIP NO VENT (SUCTIONS) ×1 IMPLANT

## 2024-03-12 NOTE — Anesthesia Preprocedure Evaluation (Signed)
 Anesthesia Evaluation  Patient identified by MRN, date of birth, ID band Patient awake    Reviewed: Allergy & Precautions, NPO status , Patient's Chart, lab work & pertinent test results  Airway Mallampati: II  TM Distance: >3 FB Neck ROM: Full    Dental  (+) Teeth Intact, Dental Advisory Given   Pulmonary former smoker   Pulmonary exam normal breath sounds clear to auscultation       Cardiovascular hypertension, Pt. on medications (-) angina (-) Past MI Normal cardiovascular exam Rhythm:Regular Rate:Normal     Neuro/Psych SEVERE L2-3 STENOSIS  Neuromuscular disease    GI/Hepatic negative GI ROS, Neg liver ROS,,,  Endo/Other  negative endocrine ROS    Renal/GU negative Renal ROS     Musculoskeletal  (+) Arthritis ,    Abdominal   Peds  Hematology  (+) HIV  Anesthesia Other Findings   Reproductive/Obstetrics                              Anesthesia Physical Anesthesia Plan  ASA: 3  Anesthesia Plan: General   Post-op Pain Management: Tylenol  PO (pre-op)* and Gabapentin  PO (pre-op)*   Induction: Intravenous  PONV Risk Score and Plan: 2 and Dexamethasone , Ondansetron  and Treatment may vary due to age or medical condition  Airway Management Planned: Oral ETT and Video Laryngoscope Planned  Additional Equipment:   Intra-op Plan:   Post-operative Plan: Extubation in OR  Informed Consent: I have reviewed the patients History and Physical, chart, labs and discussed the procedure including the risks, benefits and alternatives for the proposed anesthesia with the patient or authorized representative who has indicated his/her understanding and acceptance.     Dental advisory given  Plan Discussed with: CRNA  Anesthesia Plan Comments: (2nd PIV after induction)         Anesthesia Quick Evaluation

## 2024-03-12 NOTE — Anesthesia Preprocedure Evaluation (Signed)
 Anesthesia Evaluation  Patient identified by MRN, date of birth, ID band Patient awake    Reviewed: Allergy & Precautions, NPO status , Patient's Chart, lab work & pertinent test results  Airway Mallampati: II  TM Distance: >3 FB Neck ROM: Full    Dental  (+) Dental Advisory Given   Pulmonary former smoker   breath sounds clear to auscultation       Cardiovascular hypertension, Pt. on medications  Rhythm:Regular Rate:Normal     Neuro/Psych  Neuromuscular disease    GI/Hepatic negative GI ROS, Neg liver ROS,,,  Endo/Other  negative endocrine ROS    Renal/GU negative Renal ROS     Musculoskeletal  (+) Arthritis ,    Abdominal   Peds  Hematology  (+) HIV  Anesthesia Other Findings   Reproductive/Obstetrics                              Anesthesia Physical Anesthesia Plan  ASA: 2  Anesthesia Plan: General   Post-op Pain Management: Tylenol  PO (pre-op)*   Induction: Intravenous  PONV Risk Score and Plan: 2 and Dexamethasone , Ondansetron  and Treatment may vary due to age or medical condition  Airway Management Planned: Oral ETT  Additional Equipment:   Intra-op Plan:   Post-operative Plan: Extubation in OR  Informed Consent: I have reviewed the patients History and Physical, chart, labs and discussed the procedure including the risks, benefits and alternatives for the proposed anesthesia with the patient or authorized representative who has indicated his/her understanding and acceptance.     Dental advisory given  Plan Discussed with: CRNA  Anesthesia Plan Comments:         Anesthesia Quick Evaluation

## 2024-03-12 NOTE — Transfer of Care (Signed)
 Immediate Anesthesia Transfer of Care Note  Patient: Troy Marsh  Procedure(s) Performed: ANTERIOR LATERAL LUMBAR FUSION 1 LEVEL (Right)  Patient Location: PACU  Anesthesia Type:General  Level of Consciousness: drowsy  Airway & Oxygen Therapy: Patient Spontanous Breathing and Patient connected to face mask oxygen  Post-op Assessment: Report given to RN and Post -op Vital signs reviewed and stable  Post vital signs: Reviewed and stable  Last Vitals:  Vitals Value Taken Time  BP 144/75 03/12/24 10:32  Temp    Pulse 61 03/12/24 10:34  Resp 16 03/12/24 10:34  SpO2 100 % 03/12/24 10:34  Vitals shown include unfiled device data.  Last Pain:  Vitals:   03/12/24 0643  TempSrc:   PainSc: 0-No pain         Complications: No notable events documented.

## 2024-03-12 NOTE — H&P (Signed)
 PREOPERATIVE H&P  Chief Complaint: Left leg pain  HPI: Troy Marsh is a 73 y.o. male who presents with ongoing pain in the left leg and back  MRI reveals severe stenosis and disc degneration at L2/3, the level above the patient's fusion  Patient has failed multiple forms of conservative care and continues to have pain (see office notes for additional details regarding the patient's full course of treatment)  Past Medical History:  Diagnosis Date   Arthritis    BPH (benign prostatic hyperplasia)    [facility]  DR. YSIDRO   Cancer [facility])    Skin cancer- basal and squamous   Closed fracture of nasal bones 06/02/2022   Constipation due to pain medication    HIV disease (HCC) 1985   last CD4 10/02/12 was 277   Hyperlipidemia    Hypertension    Hypogonadism male    IBS (irritable bowel syndrome)    Past Surgical History:  Procedure Laterality Date   ANTERIOR CERVICAL DECOMP/DISCECTOMY FUSION Bilateral 11/22/2017   Procedure: ANTERIOR CERVICAL DECOMPRESSION FUSION CERVICAL 4-5, CERVICAL 5-6, CERVICAL 6-7 WITH INSTRUMENTATION AND ALLOGRAFT TIME      /   REQUESTED 4 HOURS;  Surgeon: Beuford Anes, MD;  Location: [facility] OR;  Service: Orthopedics;  Laterality: Bilateral;   ANTERIOR LAT LUMBAR FUSION Left 02/14/2023   Procedure: LEFT SIDED LUMBAR THREE - LUMBAR FOUR, LUMBAR FOUR - LUMBAR FIVE LATERAL INTERBODY FUSION WITH INSTRUMENTATION AND ALLOGRAFT;  Surgeon: Beuford Anes, MD;  Location: [facility] OR;  Service: Orthopedics;  Laterality: Left;   BACK SURGERY     COLONOSCOPY W/ POLYPECTOMY     EYE SURGERY Bilateral    Lasik, Cataract   HEMORRHOID SURGERY  1973   INGUINAL HERNIA REPAIR Right 06/11/2018   Procedure: LAPAROSCOPIC RIGHT INGUINAL HERNIA WITH MESH;  Surgeon: Rubin Calamity, MD;  Location: [facility] OR;  Service: General;  Laterality: Right;   INSERTION OF MESH Right 06/11/2018   Procedure: INSERTION OF MESH;  Surgeon: Rubin Calamity, MD;  Location: [facility] OR;  Service:  General;  Laterality: Right;   KNEE ARTHROSCOPY Right 2009   KNEE ARTHROSCOPY Left 2011   neck surgery  1984   POSTERIOR CERVICAL FUSION/FORAMINOTOMY N/A 05/29/2018   Procedure: POSTERIOR CERVICAL DECOMPRESSION FUSION CERVICAL 4 - CERVICAL 7 WITH INSTRUMENTATION AND ALLOGRAFT;  Surgeon: Beuford Anes, MD;  Location: [facility] OR;  Service: Orthopedics;  Laterality: N/A;   TOTAL KNEE ARTHROPLASTY Left 12/25/2023   Procedure: ARTHROPLASTY, KNEE, TOTAL;  Surgeon: Josefina Chew, MD;  Location: [facility] ORS;  Service: Orthopedics;  Laterality: Left;   Social History   Socioeconomic History   Marital status: Married    Spouse name: Glean Raspberry (partner)   Number of children: Not on file   Years of education: 16   Highest education level: Associate degree: occupational, Scientist, product/process development, or vocational program  Occupational History   Occupation: retired    Associate Professor: [employer]    Comment: government work  Tobacco Use   Smoking status: Former    Current packs/day: 0.00    Average packs/day: 2.0 packs/day for 35.0 years (70.0 ttl pk-yrs)    Types: Cigarettes    Start date: 02/09/1969    Quit date: 02/10/2004    Years since quitting: 20.0   Smokeless tobacco: Never  Vaping Use   Vaping status: Never Used  Substance and Sexual Activity   Alcohol  use: No    Comment: quit 2004   Drug use: No   Sexual activity: Yes  Partners: Male    Birth control/protection: Condom    Comment: monogamous w/ single male partner 1981  Other Topics Concern   Not on file  Social History Narrative   [employer]. Work - [employer] - retired 2009. Long term monogamous relationship. Lives in his own home.             Social Drivers of Corporate investment banker Strain: Low Risk  (07/26/2023)   Overall Financial Resource Strain (CARDIA)    Difficulty of Paying Living Expenses: Not hard at all  Food Insecurity: No Food Insecurity (07/26/2023)   Hunger Vital Sign    Worried About  Running Out of Food in the Last Year: Never true    Ran Out of Food in the Last Year: Never true  Transportation Needs: No Transportation Needs (07/26/2023)   PRAPARE - Administrator, Civil Service (Medical): No    Lack of Transportation (Non-Medical): No  Physical Activity: Sufficiently Active (07/26/2023)   Exercise Vital Sign    Days of Exercise per Week: 3 days    Minutes of Exercise per Session: 60 min  Stress: No Stress Concern Present (07/26/2023)   [employer] of Occupational Health - Occupational Stress Questionnaire    Feeling of Stress : Only a little  Social Connections: Moderately Integrated (07/26/2023)   Social Connection and Isolation Panel    Frequency of Communication with Friends and Family: Three times a week    Frequency of Social Gatherings with Friends and Family: Once a week    Attends Religious Services: More than 4 times per year    Active Member of [employer] or Organizations: No    Attends Engineer, structural: Not on file    Marital Status: Married   Family History  Problem Relation Age of Onset   Coronary artery disease Father    Heart attack Father    Hypertension Father    Hyperlipidemia Father    Cancer Father        prostate   Colitis Maternal Grandmother    Diabetes Neg Hx    Colon cancer Neg Hx    Rectal cancer Neg Hx    Stomach cancer Neg Hx    Esophageal cancer Neg Hx    No Known Allergies Prior to Admission medications   Medication Sig Start Date End Date Taking? Authorizing Provider  acetaminophen  (TYLENOL ) 650 MG CR tablet Take 650 mg by mouth every 8 (eight) hours as needed for pain.   Yes [provider]  alendronate (FOSAMAX) 70 MG tablet Take 70 mg by mouth once a week.   Yes [provider]  amoxicillin  (AMOXIL ) 500 MG capsule Take 2,000 mg by mouth See admin instructions. Dental procedures 02/27/24  Yes [provider]  aspirin EC 81 MG tablet Take 81 mg by mouth daily. Swallow whole.    Yes [provider]  atorvastatin  (LIPITOR) 40 MG tablet Take 40 mg by mouth at bedtime.  11/29/15 03/12/24 Yes [provider]  calcium  carbonate (TUMS EX) 750 MG chewable tablet Chew 750 mg by mouth daily as needed for indigestion or heartburn.   Yes [provider]  Carboxymethylcellulose Sodium (THERATEARS OP) Apply 1 drop to eye as needed (Dry eye).   Yes [provider]  cetirizine-pseudoephedrine (ZYRTEC-D) 5-120 MG tablet Take 1 tablet by mouth daily as needed for allergies.    Yes [provider]  diltiazem  2 % GEL Apply 1 Application topically at  bedtime. 02/15/24  Yes Charlanne Groom, MD  emtricitabine -rilpivir-tenofovir  AF (ODEFSEY ) 200-25-25 MG TABS tablet Take 1 tablet by mouth daily. 04/10/16  Yes [provider]  gabapentin  (NEURONTIN ) 300 MG capsule Take 300 mg by mouth 3 (three) times daily. 12/01/20  Yes [provider]  hydrochlorothiazide  (HYDRODIURIL ) 50 MG tablet TAKE 1 TABLET BY MOUTH DAILY 01/28/24  Yes Plotnikov, Aleksei V, MD  HYDROcodone -acetaminophen  (NORCO) 7.5-325 MG tablet Take 1 tablet by mouth every 6 (six) hours as needed for severe pain (pain score 7-10). 03/09/24  Yes Plotnikov, Karlynn GAILS, MD  hydrocortisone  (ANUSOL -HC) 25 MG suppository Place 1 suppository (25 mg total) rectally 2 (two) times daily. Patient taking differently: Place 25 mg rectally daily. 07/27/23  Yes Charlanne Groom, MD  LINZESS  290 MCG CAPS capsule TAKE 1 CAPSULE BY MOUTH DAILY  BEFORE BREAKFAST 06/25/23  Yes Charlanne Groom, MD  Melatonin 10 MG TABS Take 10 mg by mouth at bedtime.   Yes [provider]  methocarbamol  (ROBAXIN ) 500 MG tablet Take 500 mg by mouth every 6 (six) hours as needed for muscle spasms. 01/28/24  Yes [provider]  montelukast  (SINGULAIR ) 10 MG tablet TAKE 1 TABLET BY MOUTH DAILY 06/20/23  Yes Plotnikov, Aleksei V, MD  Multiple Vitamin (MULTIVITAMIN PO) Take 1 tablet by mouth daily.   Yes [provider]  polyethylene glycol powder (GLYCOLAX /MIRALAX ) powder Take 17 g by mouth daily as needed for severe constipation, moderate constipation or mild constipation.   Yes [provider]  potassium chloride  (KLOR-CON  M) 10 MEQ tablet Take 1 tablet (10 mEq total) by mouth daily. 11/30/23  Yes Plotnikov, Karlynn GAILS, MD  ramelteon  (ROZEREM ) 8 MG tablet Take 1 tablet (8 mg total) by mouth at bedtime. 11/30/23  Yes Plotnikov, Karlynn GAILS, MD  RESTASIS  0.05 % ophthalmic emulsion Place 1 drop into both eyes daily. 10/25/15  Yes [provider]  testosterone  enanthate (DELATESTRYL ) 200 MG/ML injection INJECT 1 ML INTO THE MUSCLE EVERY 14 DAYS. DISCARD VIAL 28 DAYS AFTER PUNCTURED. 03/09/24  Yes Plotnikov, Karlynn GAILS, MD  CAVERJECT  IMPULSE 20 MCG injection INJECT 20 MCG BY INTRACAVITARY  ROUTE AS DIRECTED AS NEEDED FOR  ERECTILE DYSFUNCTION USE NO MORE THAN 3 TIMES WEEKLY 09/03/23   Plotnikov, Aleksei V, MD  cyclobenzaprine  (FLEXERIL ) 5 MG tablet Take 5-10 mg by mouth at bedtime as needed for muscle spasms. Patient not taking: Reported on 03/05/2024 06/26/22   [provider]  methylPREDNISolone  (MEDROL  DOSEPAK) 4 MG TBPK tablet Take by mouth. Patient not taking: Reported on 03/05/2024 02/18/24   [provider]     All other systems have been reviewed and were otherwise negative with the exception of those mentioned in the HPI and as above.  Physical Exam: Vitals:   03/12/24 0604  BP: (!) 159/78  Pulse: 66  Resp: 18  Temp: 98.6 F (37 C)  SpO2: 99%    Body mass index is 18.45 kg/m.  General: Alert, no acute distress Cardiovascular: No pedal edema Respiratory: No cyanosis, no use of accessory musculature Skin: No lesions in the area of chief complaint Neurologic: Sensation intact distally Psychiatric: Patient is competent for consent with normal mood and affect Lymphatic: No axillary or cervical lymphadenopathy   Assessment/Plan: SEVERE STENOSIS AT  L2/3 Plan for Procedure(s): ANTERIOR LATERAL LUMBAR FUSION (STAGE 1 OF 2)   Troy LITTIE Priestly, MD 03/12/2024 6:44 AM

## 2024-03-12 NOTE — Anesthesia Procedure Notes (Signed)
 Procedure Name: Intubation Date/Time: 03/12/2024 7:52 AM  Performed by: Julien Manus, CRNAPre-anesthesia Checklist: Patient identified, Emergency Drugs available, Suction available and Patient being monitored Patient Re-evaluated:Patient Re-evaluated prior to induction Oxygen Delivery Method: Circle System Utilized Preoxygenation: Pre-oxygenation with 100% oxygen Induction Type: IV induction Ventilation: Mask ventilation without difficulty Laryngoscope Size: Glidescope and 4 Grade View: Grade I Tube type: Oral Tube size: 7.0 mm Number of attempts: 1 Airway Equipment and Method: Stylet and Oral airway Placement Confirmation: ETT inserted through vocal cords under direct vision, positive ETCO2 and breath sounds checked- equal and bilateral Secured at: 22 cm Tube secured with: Tape Dental Injury: Teeth and Oropharynx as per pre-operative assessment

## 2024-03-12 NOTE — Op Note (Signed)
 PATIENT NAME: Troy Marsh   MEDICAL RECORD NO.:   [id number]    DATE OF BIRTH: January 03, 1951   DATE OF PROCEDURE: 03/12/2024                               OPERATIVE REPORT   PREOPERATIVE DIAGNOSES: 1.  Left-sided L3 radiculopathy. 2.  Status post previous fusion spanning L3-L5 3.  Segmental collapse on the left at L2-3 4.  Severe spinal stenosis, L2-3   POSTOPERATIVE DIAGNOSES: 1.  Left-sided L3 radiculopathy. 2.  Status post previous fusion spanning L3-L5 3.  Segmental collapse on the left at L2-3 4.  Severe spinal stenosis, L2-3   PROCEDURE:  1.  Right-sided lateral interbody fusion, L2/3 via direct lateral retroperitoneal approach. 2.  Insertion of interbody device x1 (50 mm x 18 mm Globus expandable intervertebral spacer). 3.  Placement of anterior instrumentation, L2/3 4.  Partial excision of the right 11th rib 5.  Use of local autograft (morselized 11th rib segment) 6.  Use of morselized allograft -- DBX mix   7.  Intraoperative use of fluoroscopy   SURGEON:  Oneil Priestly, MD   ASSISTANT:  Ileana Clara PA-C.   ANESTHESIA:  General endotracheal anesthesia.   COMPLICATIONS:  None.   DISPOSITION:  Stable.   ESTIMATED BLOOD LOSS:  Minimal.   INDICATIONS:  Briefly, Mr. Boutelle is a very pleasant 73 year-old male who did present to me with ongoing severe pain in the left leg.  He is status post a lumbar fusion spanning L3-L5.  He did well from that surgery, however, began having severe pain in his left leg about 4 months ago.  His imaging studies did reveal prominent segmental collapse on the left side at L2-3, with severe stenosis at L2-3.  Given his ongoing and debilitating pain, we did discuss proceeding with a 2 staged procedure.  Specifically, a lateral interbody fusion today, followed by a posterior decompression and fusion tomorrow. The patient did wish to proceed, after a full understanding of the risks and benefits of surgery.   DESCRIPTION OF PROCEDURE:  On  03/12/2024, the patient was brought to surgery and general endotracheal anesthesia was administered.  The patient was placed in the lateral decubitus position, with the right side up. Neurologic monitoring leads were placed by the monitoring technician.  The patient's torso and lower extremities were secured to the bed.  The patient's hips and knees were flexed in order to lessen the tension on the psoas musculature.  The left flank was then prepped and draped in the usual sterile fashion.  The bed was flexed, in order to optimize exposure to the L2-3 intervertebral space.  After a timeout procedure was performed, a right-sided transverse incision was made over the right flank overlying the L2-3 intervertebral space.  It was readily apparent that the right 11th rib was immediately overlying the intervertebral disc space needing access.  At this point, I subperiosteally exposed the 11th rib.  I did progressively dissect circumferentially around the rib, maintaining the neurovascular bundle at the inferior aspect of the rib.  Using a rib cutter, a 6 cm aspect of the rib was removed uneventfully.  I then carried my dissection towards the L2-3 intervertebral disc space.  The parietal pleura was entered.  At this point, I did feel the best access to the intervertebral disc space would be below the diaphragm.  The parietal pleura was then closed using 3-0 Vicryl, and at  this point, I dissected beneath the diaphragm and entered the retroperitoneal space.  The psoas musculature was identified and the initial dilator was docked over the L2-3 intervertebral disc space.  Lateral fluoroscopic imaging did confirm the appropriate positioning of the initial dilator.  Neurologic monitoring  was utilized to ensure that there are no neurologic structures in the immediate vicinity of the dilator, and there were not.  I then sequentially dilated and again, there were no neurologic structures identified in the region of the of the  dilators. A self-retaining retractor was placed, and was attached to a rigid arm.  The retractor was very gently dilated and a shim was placed into the L2-3 intervertebral space.  I then used a knife to perform an annulotomy at the lateral aspect of the L2-3 intervertebral space.  I then used a series of curettes and pituitary rongeurs in order to perform a thorough and complete L2-3 intervertebral diskectomy.  The contralateral annulus was released.  I then placed a series of intervertebral spacer trials, and I did feel that an 18 mm x 50 mm spacer would be the most appropriate fit.  The appropriate spacer was then packed with autograft obtained from morselization of the segment of the right rib, in addition to DBX mix.  The spacer was then tamped into position and expanded to 10 mm in height.  I was very pleased with the final resting position of the intervertebral spacer.  Excellent height restoration was noted on the left side, the side of the preoperative collapse.  I was very pleased with the final AP and lateral fluoroscopic images and the excellent restoration of disk height.  At this point, the wound was copiously irrigated.  The retroperitoneal space was closed using #1 Vicryl.  The subcutaneous layer was closed using 2-0 Vicryl and the skin was closed using 4-0 Monocryl.    Of note, I did use neurologic monitoring throughout the entire surgery, and there was no sustained EMG activity noted throughout the entire surgery. All instrument counts were correct at the termination of the procedure.   Of note, Ileana Clara was my assistant throughout surgery, and did aid in retraction, placement of the hardware, suctioning, and closure.   Oneil Priestly, MD

## 2024-03-13 ENCOUNTER — Encounter (HOSPITAL_COMMUNITY): Admitting: Anesthesiology

## 2024-03-13 ENCOUNTER — Inpatient Hospital Stay (HOSPITAL_COMMUNITY)

## 2024-03-13 ENCOUNTER — Encounter (HOSPITAL_COMMUNITY)
Admission: RE | Disposition: A | Discharge status: Home / Self Care | Payer: Self-pay | Source: Home / Self Care | Attending: Orthopedic Surgery

## 2024-03-13 ENCOUNTER — Inpatient Hospital Stay (HOSPITAL_COMMUNITY): Admitting: Anesthesiology

## 2024-03-13 ENCOUNTER — Encounter (HOSPITAL_COMMUNITY): Payer: Self-pay | Admitting: Orthopedic Surgery

## 2024-03-13 ENCOUNTER — Other Ambulatory Visit: Payer: Self-pay

## 2024-03-13 ENCOUNTER — Inpatient Hospital Stay (HOSPITAL_COMMUNITY): Admission: RE | Admit: 2024-03-13 | Source: Home / Self Care | Admitting: Orthopedic Surgery

## 2024-03-13 DIAGNOSIS — I700 Atherosclerosis of aorta: Secondary | ICD-10-CM

## 2024-03-13 DIAGNOSIS — M48061 Spinal stenosis, lumbar region without neurogenic claudication: Secondary | ICD-10-CM | POA: Diagnosis not present

## 2024-03-13 DIAGNOSIS — Z87891 Personal history of nicotine dependence: Secondary | ICD-10-CM

## 2024-03-13 DIAGNOSIS — I10 Essential (primary) hypertension: Secondary | ICD-10-CM

## 2024-03-13 SURGERY — POSTERIOR LUMBAR FUSION 1 LEVEL
Anesthesia: General | Site: Back

## 2024-03-13 MED ORDER — BUPIVACAINE LIPOSOME 1.3 % IJ SUSP
INTRAMUSCULAR | Status: AC
Start: 2024-03-13 — End: 2024-03-13
  Filled 2024-03-13: qty 20

## 2024-03-13 MED ORDER — ORAL CARE MOUTH RINSE: 15.0000 mL | Freq: Once | OROMUCOSAL | Status: AC

## 2024-03-13 MED ORDER — CHLORHEXIDINE GLUCONATE CLOTH 2 % EX PADS
6.0000 | MEDICATED_PAD | Freq: Every day | CUTANEOUS | Status: DC
  Administered 2024-03-13 – 2024-03-14 (×2): 6 via TOPICAL

## 2024-03-13 MED ORDER — MIDAZOLAM HCL 2 MG/2ML IJ SOLN
INTRAMUSCULAR | Status: DC | PRN
  Administered 2024-03-13: 2 mg via INTRAVENOUS

## 2024-03-13 MED ORDER — ROCURONIUM BROMIDE 10 MG/ML (PF) SYRINGE
PREFILLED_SYRINGE | INTRAVENOUS | Status: AC
  Filled 2024-03-13: qty 10

## 2024-03-13 MED ORDER — BUPIVACAINE-EPINEPHRINE (PF) 0.25% -1:200000 IJ SOLN
INTRAMUSCULAR | Status: AC
  Filled 2024-03-13: qty 30

## 2024-03-13 MED ORDER — SUGAMMADEX SODIUM 200 MG/2ML IV SOLN
INTRAVENOUS | Status: DC | PRN
  Administered 2024-03-13: 200 mg via INTRAVENOUS

## 2024-03-13 MED ORDER — ONDANSETRON HCL 4 MG/2ML IJ SOLN
INTRAMUSCULAR | Status: AC
  Filled 2024-03-13: qty 2

## 2024-03-13 MED ORDER — 0.9 % SODIUM CHLORIDE (POUR BTL) OPTIME
TOPICAL | Status: DC | PRN
Start: 2024-03-13 — End: 2024-03-13
  Administered 2024-03-13: 1000 mL

## 2024-03-13 MED ORDER — MUPIROCIN 2 % EX OINT
1.0000 | TOPICAL_OINTMENT | Freq: Two times a day (BID) | CUTANEOUS | Status: DC
Start: 2024-03-13 — End: 2024-03-18
  Administered 2024-03-13 – 2024-03-14 (×2): 1 via NASAL
  Filled 2024-03-13: qty 22

## 2024-03-13 MED ORDER — PROPOFOL 10 MG/ML IV BOLUS
INTRAVENOUS | Status: AC
  Filled 2024-03-13: qty 20

## 2024-03-13 MED ORDER — FENTANYL CITRATE (PF) 100 MCG/2ML IJ SOLN: 25.0000 ug | INTRAMUSCULAR | Status: DC | PRN

## 2024-03-13 MED ORDER — CHLORHEXIDINE GLUCONATE 0.12 % MT SOLN
15.0000 mL | Freq: Once | OROMUCOSAL | Status: AC
  Administered 2024-03-13: 15 mL via OROMUCOSAL
  Filled 2024-03-13: qty 15

## 2024-03-13 MED ORDER — FENTANYL CITRATE (PF) 250 MCG/5ML IJ SOLN
INTRAMUSCULAR | Status: AC
Start: 2024-03-13 — End: 2024-03-13
  Filled 2024-03-13: qty 5

## 2024-03-13 MED ORDER — LACTATED RINGERS IV SOLN: INTRAVENOUS | Status: DC

## 2024-03-13 MED ORDER — KETAMINE HCL 10 MG/ML IJ SOLN
INTRAMUSCULAR | Status: DC | PRN
  Administered 2024-03-13: 10 mg via INTRAVENOUS
  Administered 2024-03-13: 20 mg via INTRAVENOUS

## 2024-03-13 MED ORDER — ROCURONIUM BROMIDE 10 MG/ML (PF) SYRINGE
PREFILLED_SYRINGE | INTRAVENOUS | Status: DC | PRN
  Administered 2024-03-13: 70 mg via INTRAVENOUS

## 2024-03-13 MED ORDER — LIDOCAINE 2% (20 MG/ML) 5 ML SYRINGE
INTRAMUSCULAR | Status: DC | PRN
  Administered 2024-03-13: 60 mg via INTRAVENOUS

## 2024-03-13 MED ORDER — THROMBIN 20000 UNITS EX SOLR: CUTANEOUS | Status: DC | PRN

## 2024-03-13 MED ORDER — ONDANSETRON HCL 4 MG/2ML IJ SOLN: 4.0000 mg | Freq: Once | INTRAMUSCULAR | Status: DC | PRN

## 2024-03-13 MED ORDER — BUPIVACAINE-EPINEPHRINE (PF) 0.25% -1:200000 IJ SOLN
INTRAMUSCULAR | Status: DC | PRN
  Administered 2024-03-13: 40 mL

## 2024-03-13 MED ORDER — FENTANYL CITRATE (PF) 250 MCG/5ML IJ SOLN
INTRAMUSCULAR | Status: DC | PRN
  Administered 2024-03-13 (×3): 50 ug via INTRAVENOUS

## 2024-03-13 MED ORDER — BUPIVACAINE-EPINEPHRINE 0.25% -1:200000 IJ SOLN
INTRAMUSCULAR | Status: DC | PRN
  Administered 2024-03-13: 10 mL

## 2024-03-13 MED ORDER — PHENYLEPHRINE 80 MCG/ML (10ML) SYRINGE FOR IV PUSH (FOR BLOOD PRESSURE SUPPORT)
PREFILLED_SYRINGE | INTRAVENOUS | Status: DC | PRN
  Administered 2024-03-13 (×3): 80 ug via INTRAVENOUS
  Administered 2024-03-13: 160 ug via INTRAVENOUS

## 2024-03-13 MED ORDER — MIDAZOLAM HCL 2 MG/2ML IJ SOLN
INTRAMUSCULAR | Status: AC
  Filled 2024-03-13: qty 2

## 2024-03-13 MED ORDER — KETAMINE HCL 50 MG/5ML IJ SOSY
PREFILLED_SYRINGE | INTRAMUSCULAR | Status: AC
  Filled 2024-03-13: qty 5

## 2024-03-13 MED ORDER — METHYLENE BLUE (ANTIDOTE) 1 % IV SOLN
INTRAVENOUS | Status: AC
  Filled 2024-03-13: qty 10

## 2024-03-13 MED ORDER — THROMBIN 20000 UNITS EX SOLR
CUTANEOUS | Status: AC
Start: 2024-03-13 — End: 2024-03-13
  Filled 2024-03-13: qty 20000

## 2024-03-13 MED ORDER — PHENYLEPHRINE HCL-NACL 20-0.9 MG/250ML-% IV SOLN
INTRAVENOUS | Status: DC | PRN
  Administered 2024-03-13: 25 ug/min via INTRAVENOUS

## 2024-03-13 MED ORDER — LIDOCAINE 2% (20 MG/ML) 5 ML SYRINGE
INTRAMUSCULAR | Status: AC
  Filled 2024-03-13: qty 5

## 2024-03-13 MED ORDER — ONDANSETRON HCL 4 MG/2ML IJ SOLN
INTRAMUSCULAR | Status: DC | PRN
  Administered 2024-03-13: 4 mg via INTRAVENOUS

## 2024-03-13 MED ORDER — PROPOFOL 10 MG/ML IV BOLUS
INTRAVENOUS | Status: DC | PRN
  Administered 2024-03-13: 160 mg via INTRAVENOUS

## 2024-03-13 SURGICAL SUPPLY — 71 items
BAG COUNTER SPONGE SURGICOUNT (BAG) ×1 IMPLANT
BENZOIN TINCTURE PRP APPL 2/3 (GAUZE/BANDAGES/DRESSINGS) ×1 IMPLANT
BLADE CLIPPER SURG (BLADE) IMPLANT
BUR PRESCISION 1.7 ELITE (BURR) ×1 IMPLANT
BUR ROUND FLUTED 5 RND (BURR) ×1 IMPLANT
BUR ROUND PRECISION 4.0 (BURR) IMPLANT
BUR SABER RD CUTTING 3.0 (BURR) IMPLANT
CNTNR URN SCR LID CUP LEK RST (MISCELLANEOUS) ×1 IMPLANT
CONNECTOR EXPEDIUM TI 55MM (Connector) IMPLANT
COVER MAYO STAND STRL (DRAPES) ×2 IMPLANT
COVER SURGICAL LIGHT HANDLE (MISCELLANEOUS) ×1 IMPLANT
DRAPE C-ARM 42X72 X-RAY (DRAPES) ×1 IMPLANT
DRAPE C-ARMOR (DRAPES) IMPLANT
DRAPE POUCH INSTRU U-SHP 10X18 (DRAPES) ×1 IMPLANT
DRAPE SURG 17X23 STRL (DRAPES) ×4 IMPLANT
DURAPREP 26ML APPLICATOR (WOUND CARE) ×1 IMPLANT
ELECT CAUTERY BLADE 6.4 (BLADE) ×1 IMPLANT
ELECTRODE BLDE 4.0 EZ CLN MEGD (MISCELLANEOUS) ×1 IMPLANT
ELECTRODE REM PT RTRN 9FT ADLT (ELECTROSURGICAL) ×1 IMPLANT
EVACUATOR SILICONE 100CC (DRAIN) IMPLANT
FILTER STRAW FLUID ASPIR (MISCELLANEOUS) ×1 IMPLANT
GAUZE 4X4 16PLY ~~LOC~~+RFID DBL (SPONGE) ×1 IMPLANT
GAUZE SPONGE 4X4 12PLY STRL (GAUZE/BANDAGES/DRESSINGS) ×1 IMPLANT
GLOVE BIO SURGEON STRL SZ 6.5 (GLOVE) ×1 IMPLANT
GLOVE BIO SURGEON STRL SZ8 (GLOVE) ×1 IMPLANT
GLOVE BIOGEL PI IND STRL 7.0 (GLOVE) ×1 IMPLANT
GLOVE BIOGEL PI IND STRL 8 (GLOVE) ×1 IMPLANT
GLOVE SURG ENC MOIS LTX SZ6.5 (GLOVE) ×1 IMPLANT
GOWN STRL REUS W/ TWL LRG LVL3 (GOWN DISPOSABLE) ×2 IMPLANT
GOWN STRL REUS W/ TWL XL LVL3 (GOWN DISPOSABLE) ×1 IMPLANT
IV CATH 14GX2 1/4 (CATHETERS) ×1 IMPLANT
KIT BASIN OR (CUSTOM PROCEDURE TRAY) ×1 IMPLANT
KIT POSITIONER JACKSON TABLE (MISCELLANEOUS) ×1 IMPLANT
KIT TURNOVER KIT B (KITS) ×1 IMPLANT
MARKER SKIN DUAL TIP RULER LAB (MISCELLANEOUS) ×2 IMPLANT
NDL 18GX1X1/2 (RX/OR ONLY) (NEEDLE) ×1 IMPLANT
NDL 22X1.5 STRL (OR ONLY) (MISCELLANEOUS) ×2 IMPLANT
NDL HYPO 25GX1X1/2 BEV (NEEDLE) ×1 IMPLANT
NDL SPNL 18GX3.5 QUINCKE PK (NEEDLE) ×2 IMPLANT
NEEDLE 18GX1X1/2 (RX/OR ONLY) (NEEDLE) ×1 IMPLANT
NEEDLE 22X1.5 STRL (OR ONLY) (MISCELLANEOUS) ×2 IMPLANT
NEEDLE HYPO 25GX1X1/2 BEV (NEEDLE) ×1 IMPLANT
NEEDLE SPNL 18GX3.5 QUINCKE PK (NEEDLE) ×2 IMPLANT
NS IRRIG 1000ML POUR BTL (IV SOLUTION) ×1 IMPLANT
PACK LAMINECTOMY ORTHO (CUSTOM PROCEDURE TRAY) ×1 IMPLANT
PACK UNIVERSAL I (CUSTOM PROCEDURE TRAY) ×1 IMPLANT
PAD ARMBOARD POSITIONER FOAM (MISCELLANEOUS) ×2 IMPLANT
PATTIES SURGICAL .5 X1 (DISPOSABLE) ×1 IMPLANT
PATTIES SURGICAL .5X1.5 (GAUZE/BANDAGES/DRESSINGS) ×1 IMPLANT
PUTTY DBX 2.5CC DEPUY (Putty) IMPLANT
ROD SPINAL EXP 5.5X60 (Rod) IMPLANT
SCREW CORTICAL VIPER 7X35 (Screw) IMPLANT
SCREW SET SINGLE INNER (Screw) IMPLANT
SPONGE INTESTINAL PEANUT (DISPOSABLE) ×1 IMPLANT
SPONGE SURGIFOAM ABS GEL 100 (HEMOSTASIS) ×1 IMPLANT
STRIP CLOSURE SKIN 1/2X4 (GAUZE/BANDAGES/DRESSINGS) ×2 IMPLANT
SURGIFLO W/THROMBIN 8M KIT (HEMOSTASIS) IMPLANT
SUT MNCRL AB 4-0 PS2 18 (SUTURE) ×1 IMPLANT
SUT VIC AB 0 CT1 18XCR BRD 8 (SUTURE) ×1 IMPLANT
SUT VIC AB 1 CT1 18XCR BRD 8 (SUTURE) ×1 IMPLANT
SUT VIC AB 2-0 CT2 18 VCP726D (SUTURE) ×1 IMPLANT
SYR 20ML LL LF (SYRINGE) ×2 IMPLANT
SYR BULB IRRIG 60ML STRL (SYRINGE) ×1 IMPLANT
SYR CONTROL 10ML LL (SYRINGE) ×2 IMPLANT
SYR TB 1ML LUER SLIP (SYRINGE) ×1 IMPLANT
TAP EXPEDIUM DL 4.35 (INSTRUMENTS) IMPLANT
TAP EXPEDIUM DL 5.0 (INSTRUMENTS) IMPLANT
TAP EXPEDIUM DL 6.0 (INSTRUMENTS) IMPLANT
TRAY FOLEY MTR SLVR 16FR STAT (SET/KITS/TRAYS/PACK) ×1 IMPLANT
WATER STERILE IRR 1000ML POUR (IV SOLUTION) ×1 IMPLANT
YANKAUER SUCT BULB TIP NO VENT (SUCTIONS) ×1 IMPLANT

## 2024-03-13 NOTE — Anesthesia Postprocedure Evaluation (Signed)
 Anesthesia Post Note  Patient: Troy Marsh  Procedure(s) Performed: POSTERIOR LUMBAR FUSION 1 LEVEL, LUMBAR TWO -LUMBAR THREE (Back)     Patient location during evaluation: PACU Anesthesia Type: General Level of consciousness: awake and alert Pain management: pain level controlled Vital Signs Assessment: post-procedure vital signs reviewed and stable Respiratory status: spontaneous breathing, nonlabored ventilation and respiratory function stable Cardiovascular status: blood pressure returned to baseline and stable Postop Assessment: no apparent nausea or vomiting Anesthetic complications: no   No notable events documented.  Last Vitals:  Vitals:   03/13/24 1115 03/13/24 1624  BP: (!) 158/87 (!) 155/87  Pulse: 70 78  Resp: 18 18  Temp:  (!) 36.4 C  SpO2: 100% 98%    Last Pain:  Vitals:   03/13/24 1624  TempSrc: Oral  PainSc:                  Garnette FORBES Skillern

## 2024-03-13 NOTE — Anesthesia Postprocedure Evaluation (Signed)
 Anesthesia Post Note  Patient: JSAON YOO  Procedure(s) Performed: ANTERIOR LATERAL LUMBAR FUSION 1 LEVEL (Right)     Patient location during evaluation: PACU Anesthesia Type: General Level of consciousness: awake and alert Pain management: pain level controlled Vital Signs Assessment: post-procedure vital signs reviewed and stable Respiratory status: spontaneous breathing, nonlabored ventilation, respiratory function stable and patient connected to nasal cannula oxygen Cardiovascular status: blood pressure returned to baseline and stable Postop Assessment: no apparent nausea or vomiting Anesthetic complications: no   No notable events documented.  Last Vitals:  Vitals:   03/13/24 0333 03/13/24 0646  BP: (!) 138/99 (!) (P) 155/81  Pulse: 71 (P) 70  Resp: 18 (P) 18  Temp: 36.6 C (P) 36.6 C  SpO2: 100% (P) 100%    Last Pain:  Vitals:   03/13/24 0732  TempSrc:   PainSc: 8    Pain Goal:                   Epifanio Lamar BRAVO

## 2024-03-13 NOTE — Op Note (Signed)
 PATIENT NAME: Troy Marsh   MEDICAL RECORD NO.:   [id number]    DATE OF BIRTH: 07/29/1950   DATE OF PROCEDURE: 03/13/2024                               OPERATIVE REPORT   PREOPERATIVE DIAGNOSES:  1.  L2-3 spinal stenosis 2.  1 day status post L2-3 lateral interbody fusion, requiring a posterior fusion and decompression   POSTOPERATIVE DIAGNOSES:  1.  L2-3 spinal stenosis 2.  1 day status post L2-3 lateral interbody fusion, requiring a posterior fusion and decompression   PROCEDURE:   1.  Posterior spinal fusion, L2-3 2.  Extension of L3-L5 instrumentation up to L2, bilaterally 3.  L2-3 decompression, including bilateral partial facetectomy 4.  Use of local autograft 5.  Use of morselized allograft -DBX mix 6.  Intraoperative use of fluoroscopy 7.  Exploration of spinal fusion L3-4, L4-5   SURGEON:  Oneil Priestly, MD   ASSISTANT:  Ileana Clara PA-C   ANESTHESIA:  General endotracheal anesthesia.   COMPLICATIONS:  None.   DISPOSITION:  Stable.   ESTIMATED BLOOD LOSS:  50 cc   INDICATIONS:  Briefly, Troy Marsh is a very pleasant 73 year-old male who did present to me with ongoing pain and weakness in the left leg.  He was noted to have severe stenosis and segmental collapse across L2-3.  He did undergo a lateral interbody fusion at L2-3 on 03/12/2024.  He did return today for the second stage of his procedure, a decompression and fusion at L2-3. The patient did wish to proceed, after a full understanding of the risks and benefits of surgery.   DESCRIPTION OF PROCEDURE:  On 03/13/2024, the patient was brought to surgery and general endotracheal anesthesia was administered.  The patient was placed prone on a well-padded flat Jackson bed with a spinal frame.  The back was prepped and draped in the usual sterile fashion, and a midline incision was made spanning approximately L2-L4.  The fascia was incised at the midline and the paraspinal musculature was retracted laterally.   The previously placed instrumentation was identified.  At this point, the bilateral L2 pedicles were cannulated, using a medial to lateral cortical trajectory technique.  A 6 x 30 mm screws were placed into the L3 pedicles bilaterally.  At this point, I turned my attention toward the decompressive aspect of the procedure.  Using a high-speed bur in addition to Kerrison punches, the right and left lateral recess at L2-3 was decompressed via partial facetectomies, with removal of the ligamentum flavum.  I was very pleased with the decompression.  I then turned my attention toward exploring the fusion at L3-4 and L4-5.  The facet joints bilaterally were subperiosteally exposed.  I then grabbed perform hold of the interconnecting rods bilaterally and performed a pushing and pulling maneuver, evaluating the facet joints, and it was clear that there was no motion across the facet joints, signifying a successful fusion.  At this point, I turned my attention toward the L2-3 fusion.  Using a high-speed bur, the remainder of the facet joints and the posterolateral gutters, including the L2 and L3 transverse processes bilaterally were decorticated.  Autograft from the decompression was packed into the posterolateral gutters bilaterally, as well as allograft, in the form of DBX mix.  At this point, bilateral L2 pedicle screws were placed, 7 x 35 mm.  Rods were secured into the tulip heads  of the L2 pedicle screws.  The rods were then connected to the lower construct using connectors.  Caps were then placed at L2 and a final locking procedure was performed.  A final locking procedure was performed over the connectors as well.  I did use AP and lateral fluoroscopy during placement of the hardware.  I was very pleased with the final AP and lateral fluoroscopic images.  Prior to placing the bone graft noted above, the wound was copiously irrigated with approximately 2 L of normal saline.  The wound was then closed using #1 Vicryl  followed by 2-0 Vicryl followed by 4-0 Monocryl.  Benzoin and Steri-Strips were applied over the left lateral wound and left posterior wound, followed by sterile dressing.       Of note, Ileana Clara was my assistant throughout surgery, and did aid in retraction, placement of the hardware, suctioning, and closure.   Oneil Priestly, MD

## 2024-03-13 NOTE — H&P (Signed)
 Tolerated stage one of his procedure yesterday without difficulty.  He presents today for stage two, a posterior decompression and fusion.  Will proceed as scheduled.

## 2024-03-13 NOTE — Anesthesia Procedure Notes (Signed)
 Procedure Name: Intubation Date/Time: 03/13/2024 7:48 AM  Performed by: Dianne Burnard RAMAN, CRNAPre-anesthesia Checklist: Patient identified, Emergency Drugs available, Suction available and Patient being monitored Patient Re-evaluated:Patient Re-evaluated prior to induction Oxygen Delivery Method: Circle system utilized Preoxygenation: Pre-oxygenation with 100% oxygen Induction Type: IV induction Ventilation: Mask ventilation without difficulty Laryngoscope Size: McGrath and 4 Grade View: Grade I Tube type: Oral Tube size: 7.5 mm Number of attempts: 1 Airway Equipment and Method: Stylet and Oral airway Placement Confirmation: ETT inserted through vocal cords under direct vision, positive ETCO2 and breath sounds checked- equal and bilateral Tube secured with: Tape Dental Injury: Teeth and Oropharynx as per pre-operative assessment

## 2024-03-13 NOTE — Transfer of Care (Signed)
 Immediate Anesthesia Transfer of Care Note  Patient: Troy Marsh  Procedure(s) Performed: POSTERIOR LUMBAR FUSION 1 LEVEL, LUMBAR TWO -LUMBAR THREE (Back)  Patient Location: PACU  Anesthesia Type:General  Level of Consciousness: awake, alert , and oriented  Airway & Oxygen Therapy: Patient Spontanous Breathing and Patient connected to face mask oxygen  Post-op Assessment: Report given to RN, Post -op Vital signs reviewed and stable, and Patient moving all extremities X 4  Post vital signs: Reviewed and stable  Last Vitals:  Vitals Value Taken Time  BP 160/77 03/13/24 10:06  Temp 36.4 C 03/13/24 10:06  Pulse 71 03/13/24 10:13  Resp 23 03/13/24 10:13  SpO2 99 % 03/13/24 10:13  Vitals shown include unfiled device data.  Last Pain:  Vitals:   03/13/24 1006  TempSrc:   PainSc: Asleep         Complications: No notable events documented.

## 2024-03-14 MED ORDER — METHOCARBAMOL 500 MG PO TABS: 500.0000 mg | ORAL_TABLET | Freq: Four times a day (QID) | ORAL | 2 refills | Status: AC | PRN

## 2024-03-14 MED ORDER — OXYCODONE-ACETAMINOPHEN 5-325 MG PO TABS: 1.0000 | ORAL_TABLET | ORAL | 0 refills | Status: DC | PRN

## 2024-03-14 NOTE — Progress Notes (Signed)
    Patient doing well  Minimal pain   Physical Exam: Vitals:   03/13/24 2253 03/14/24 0508  BP: (!) 145/65 128/61  Pulse: 92 89  Resp: 18 20  Temp: 98.4 F (36.9 C) 98.5 F (36.9 C)  SpO2: 94% 98%    Dressing in place NVI  Pt s/p lumbar decompression and fusion, doing well  - up with PT/OT, encourage ambulation - Percocet for pain, Robaxin  for muscle spasms - likely d/c home today with f/u in 2 weeks - small expected stable pneumothorax noted on xrays. Will follow outpatient. IS spirometry encouraged.

## 2024-03-14 NOTE — Plan of Care (Signed)

## 2024-03-14 NOTE — Progress Notes (Signed)
Pt. discharged home accompanied by husband. Prescriptions and discharge instructions given with verbalization of understanding. Incision site on back with no s/s of infection - no swelling, redness, bleeding, and/or drainage noted. Opportunity given to ask questions but no question asked. Pt. transported out of this unit in wheelchair by the volunteer   

## 2024-03-14 NOTE — Evaluation (Addendum)
 Occupational Therapy Evaluation Patient Details Name: Troy Marsh MRN: [id number] DOB: Feb 16, 1951 Today's Date: 03/14/2024   History of Present Illness   73 y.o. male presents to [facility] on 03/12/24 for elective L2-3 lateral interbody fusion. 8/28 L2-3 posterior decompression and fusion. PMHx: HIV, arthritis, cancer     Clinical Impressions Patient admitted for above and presents with problem list below.  PTA pt was independent. Patient was educated on brace mgmt and wear schedule, back precautions, ADL compensatory techniques, AE/DME, mobility progression, safety and recommendations.  Today, pt demonstrated ability to complete transfers using no AD with independence, functional mobility using no AD with independence, and ADLs with up to modified independence.  Pt does require initial cueing for back precaution adherence, brace mgmt; improved during session. Encouraged use of reacher and long sponge at home to optimize adherence and independence. At discharge, pt will have support from spouse.  Based on performance today, no further OT needs identified.  OT will sign off.     If plan is discharge home, recommend the following:   Assistance with cooking/housework;Assist for transportation     Functional Status Assessment   Patient has had a recent decline in their functional status and demonstrates the ability to make significant improvements in function in a reasonable and predictable amount of time.     Equipment Recommendations   None recommended by OT     Recommendations for Other Services         Precautions/Restrictions   Precautions Precautions: Fall;Back Precaution Booklet Issued: Yes (comment) Recall of Precautions/Restrictions: Intact Precaution/Restrictions Comments: Able to recall precautions with cueing Required Braces or Orthoses: Spinal Brace Spinal Brace: Thoracolumbosacral orthotic;Applied in sitting position;Applied in standing  position Restrictions Weight Bearing Restrictions Per Provider Order: No     Mobility Bed Mobility Overal bed mobility: Independent                  Transfers Overall transfer level: Independent Equipment used: None                      Balance Overall balance assessment: No apparent balance deficits (not formally assessed)                                         ADL either performed or assessed with clinical judgement   ADL Overall ADL's : Modified independent                                       General ADL Comments: pt educated on compenstaory techniques for ADLs and back precautions, pt unable to complete figure 4 technique.  He does have a reacher to assist as needed. Does require education for brace mgmt.     Vision   Vision Assessment?: No apparent visual deficits     Perception         Praxis         Pertinent Vitals/Pain Pain Assessment Pain Assessment: No/denies pain     Extremity/Trunk Assessment Upper Extremity Assessment Upper Extremity Assessment: Overall WFL for tasks assessed   Lower Extremity Assessment Lower Extremity Assessment: Defer to PT evaluation LLE Deficits / Details: prior L TKA, Hip flexion 4+/5, knee ext 5/5, Ankle DF 5/5 LLE Sensation: WNL   Cervical / Trunk Assessment Cervical / Trunk Assessment: Back  Surgery   Communication Communication Communication: No apparent difficulties   Cognition Arousal: Alert Behavior During Therapy: WFL for tasks assessed/performed Cognition: No apparent impairments                               Following commands: Intact       Cueing  General Comments   Cueing Techniques: Verbal cues      Exercises     Shoulder Instructions      Home Living Family/patient expects to be discharged to:: Private residence Living Arrangements: Spouse/significant other Available Help at Discharge: Family;Available 24 hours/day Type  of Home: Other(Comment) (town house) Home Access: Stairs to enter Secretary/administrator of Steps: 5 Entrance Stairs-Rails: Right;Left Home Layout: Two level;1/2 bath on main level Alternate Level Stairs-Number of Steps: flight with chair lift Alternate Level Stairs-Rails: Right;Left;Can reach both Bathroom Shower/Tub: Producer, television/film/video: Handicapped height Bathroom Accessibility: Yes   Home Equipment: Cane - single point;Shower seat;Grab bars - Chartered loss adjuster (2 wheels);Adaptive equipment Adaptive Equipment: Reacher;Long-handled sponge        Prior Functioning/Environment Prior Level of Function : Independent/Modified Independent             Mobility Comments: Ind with no AD ADLs Comments: Ind    OT Problem List: Pain;Decreased activity tolerance;Decreased knowledge of use of DME or AE;Decreased knowledge of precautions   OT Treatment/Interventions:        OT Goals(Current goals can be found in the care plan section)   Acute Rehab OT Goals Patient Stated Goal: home OT Goal Formulation: With patient   OT Frequency:       Co-evaluation              AM-PAC OT 6 Clicks Daily Activity     Outcome Measure Help from another person eating meals?: None Help from another person taking care of personal grooming?: None Help from another person toileting, which includes using toliet, bedpan, or urinal?: None Help from another person bathing (including washing, rinsing, drying)?: None Help from another person to put on and taking off regular upper body clothing?: None Help from another person to put on and taking off regular lower body clothing?: None 6 Click Score: 24   End of Session Equipment Utilized During Treatment: Back brace Nurse Communication: Mobility status  Activity Tolerance: Patient tolerated treatment well Patient left: in chair;with call bell/phone within reach  OT Visit Diagnosis: Other abnormalities of gait and  mobility (R26.89);Pain Pain - part of body:  (back)                Time: 9171-9150 OT Time Calculation (min): 21 min Charges:  OT General Charges $OT Visit: 1 Visit OT Evaluation $OT Eval Low Complexity: 1 Low  Etta NOVAK, OT Acute Rehabilitation Services Office [phone] Secure Chat Preferred    Etta GORMAN Hope 03/14/2024, 10:50 AM

## 2024-03-14 NOTE — Evaluation (Signed)
 Physical Therapy Evaluation Patient Details Name: Troy Marsh MRN: [id number] DOB: Apr 29, 1951 Today's Date: 03/14/2024  History of Present Illness  73 y.o. male presents to [facility] on 03/12/24 for elective L2-3 lateral interbody fusion. 8/28 L2-3 posterior decompression and fusion. PMHx: HIV, arthritis, cancer  Clinical Impression  PTA, pt was independent for mobility with no AD. Pt presents close to functional mobility baseline with ability to ambulate 329ft and negotiate 15 steps with one handrail with ModI. Educated on back precautions, brace wearing schedule, and walking program with pt verbalizing understanding. Pt will have 24/7 assist available upon d/c home. Pt has no further questions or concerns and feels good about discharging home. Pt has no further acute or post-acute PT needs. Acute PT signing off. Please re-consult if new needs arise.         If plan is discharge home, recommend the following: Assist for transportation   Can travel by private vehicle    Yes    Equipment Recommendations None recommended by PT     Functional Status Assessment Patient has not had a recent decline in their functional status     Precautions / Restrictions Precautions Precautions: Fall;Back Precaution Booklet Issued: Yes (comment) Recall of Precautions/Restrictions: Intact Precaution/Restrictions Comments: Able to recall precautions with cueing Required Braces or Orthoses: Spinal Brace Spinal Brace: Thoracolumbosacral orthotic;Applied in sitting position;Applied in standing position Restrictions Weight Bearing Restrictions Per Provider Order: No      Mobility  Bed Mobility Overal bed mobility: Independent   Transfers Overall transfer level: Independent Equipment used: None     Ambulation/Gait Ambulation/Gait assistance: Independent Gait Distance (Feet): 300 Feet Assistive device: None Gait Pattern/deviations: Step-through pattern, Decreased stride length Gait velocity:  decr    General Gait Details: slow and steady gait with no AD  Stairs Stairs: Yes Stairs assistance: Modified independent (Device/Increase time) Stair Management: One rail Left, Alternating pattern, Forwards Number of Stairs: 15 General stair comments: steady with use of one handrail     Balance Overall balance assessment: No apparent balance deficits (not formally assessed)        Pertinent Vitals/Pain Pain Assessment Pain Assessment: No/denies pain    Home Living Family/patient expects to be discharged to:: Private residence Living Arrangements: Spouse/significant other Available Help at Discharge: Family;Available 24 hours/day Type of Home: Other(Comment) (town house) Home Access: Stairs to enter Entrance Stairs-Rails: Doctor, general practice of Steps: 5 Alternate Level Stairs-Number of Steps: flight with chair lift Home Layout: Two level;1/2 bath on main level Home Equipment: Cane - single point;Shower seat;Grab bars - Chartered loss adjuster (2 wheels)      Prior Function Prior Level of Function : Independent/Modified Independent      Mobility Comments: Ind with no AD ADLs Comments: Ind     Extremity/Trunk Assessment   Upper Extremity Assessment Upper Extremity Assessment: Defer to OT evaluation    Lower Extremity Assessment Lower Extremity Assessment: LLE deficits/detail LLE Deficits / Details: prior L TKA, Hip flexion 4+/5, knee ext 5/5, Ankle DF 5/5 LLE Sensation: WNL    Cervical / Trunk Assessment Cervical / Trunk Assessment: Back Surgery  Communication   Communication Communication: No apparent difficulties    Cognition Arousal: Alert Behavior During Therapy: WFL for tasks assessed/performed   PT - Cognitive impairments: No apparent impairments      Following commands: Intact       Cueing Cueing Techniques: Verbal cues, Tactile cues      PT Assessment Patient does not need any further PT services  PT Goals  (Current goals can be found in the Care Plan section)  Acute Rehab PT Goals PT Goal Formulation: All assessment and education complete, DC therapy     AM-PAC PT 6 Clicks Mobility  Outcome Measure Help needed turning from your back to your side while in a flat bed without using bedrails?: None Help needed moving from lying on your back to sitting on the side of a flat bed without using bedrails?: None Help needed moving to and from a bed to a chair (including a wheelchair)?: None Help needed standing up from a chair using your arms (e.g., wheelchair or bedside chair)?: None Help needed to walk in hospital room?: None Help needed climbing 3-5 steps with a railing? : None 6 Click Score: 24    End of Session Equipment Utilized During Treatment: Back brace Activity Tolerance: Patient tolerated treatment well Patient left: in bed;with call bell/phone within reach Nurse Communication: Mobility status PT Visit Diagnosis: Other abnormalities of gait and mobility (R26.89)    Time: 9194-9176 PT Time Calculation (min) (ACUTE ONLY): 18 min   Charges:   PT Evaluation $PT Eval Low Complexity: 1 Low   PT General Charges $$ ACUTE PT VISIT: 1 Visit       Kate ORN, PT, DPT Secure Chat Preferred  Rehab Office [phone]   Kate BRAVO Wendolyn 03/14/2024, 8:54 AM

## 2024-03-26 NOTE — Discharge Summary (Signed)
 Patient ID: MASSIAH MINJARES MRN: [id number] DOB/AGE: 74-Sep-1952 74 y.o.  Admit date: 03/12/2024 Discharge date: 03/14/2024  Admission Diagnoses:  Principal Problem:   Radiculopathy of lumbar region   Discharge Diagnoses:  Same  Past Medical History:  Diagnosis Date   Arthritis    BPH (benign prostatic hyperplasia)    [facility]  DR. YSIDRO   Cancer [facility])    Skin cancer- basal and squamous   Closed fracture of nasal bones 06/02/2022   Constipation due to pain medication    HIV disease (HCC) 1985   last CD4 10/02/12 was 277   Hyperlipidemia    Hypertension    Hypogonadism male    IBS (irritable bowel syndrome)     Surgeries: Procedure(s): POSTERIOR LUMBAR FUSION 1 LEVEL, LUMBAR TWO -LUMBAR THREE on 03/13/2024   Consultants: None  Discharged Condition: Improved  Hospital Course: OLVIN ROHR is an 73 y.o. male who was admitted 03/12/2024 for operative treatment of Radiculopathy of lumbar region. Patient has severe unremitting pain that affects sleep, daily activities, and work/hobbies. After pre-op clearance the patient was taken to the operating room on 03/13/2024 and underwent  Procedure(s): POSTERIOR LUMBAR FUSION 1 LEVEL, LUMBAR TWO -LUMBAR THREE.    Patient was given perioperative antibiotics:  Anti-infectives (From admission, onward)    Start     Dose/Rate Route Frequency Ordered Stop   03/13/24 1000  emtricitabine -rilpivir-tenofovir  AF (ODEFSEY ) 200-25-25 MG per tablet 1 tablet  Status:  Discontinued        1 tablet Oral Daily 03/12/24 1243 03/14/24 1501   03/13/24 0600  ceFAZolin  (ANCEF ) IVPB 2g/100 mL premix        2 g 200 mL/hr over 30 Minutes Intravenous On call to O.R. 03/12/24 1648 03/13/24 0739   03/13/24 0600  vancomycin  (VANCOCIN ) IVPB 1000 mg/200 mL premix        1,000 mg 200 mL/hr over 60 Minutes Intravenous On call to O.R. 03/12/24 1648 03/13/24 0755   03/12/24 1330  ceFAZolin  (ANCEF ) IVPB 2g/100 mL premix        2 g 200 mL/hr  over 30 Minutes Intravenous Every 8 hours 03/12/24 1243 03/12/24 2109   03/12/24 0615  ceFAZolin  (ANCEF ) IVPB 2g/100 mL premix        2 g 200 mL/hr over 30 Minutes Intravenous On call to O.R. 03/12/24 0603 03/12/24 0810   03/12/24 0615  vancomycin  (VANCOCIN ) IVPB 1000 mg/200 mL premix        1,000 mg 200 mL/hr over 60 Minutes Intravenous On call to O.R. 03/12/24 0603 03/12/24 0810   03/12/24 0615  ceFAZolin  (ANCEF ) 2-4 GM/100ML-% IVPB       Note to Pharmacy: Joli Search N: cabinet override      03/12/24 0615 03/12/24 0810        Patient was given sequential compression devices, early ambulation to prevent DVT.  Patient benefited maximally from hospital stay and there were no complications.  Small expected PO pneumothorax with LAT exposure  Recent vital signs: BP 110/69 (BP Location: Left Arm)   Pulse 97   Temp 98.8 F (37.1 C)   Resp 20   Ht (P) 5' 10 (1.778 m)   Wt (P) 58.3 kg   SpO2 100%   BMI (P) 18.45 kg/m    Discharge Medications:   Allergies as of 03/14/2024   No Known Allergies      Medication List     TAKE these medications    acetaminophen  650 MG CR tablet Commonly known  as: TYLENOL  Take 650 mg by mouth every 8 (eight) hours as needed for pain.   alendronate 70 MG tablet Commonly known as: FOSAMAX Take 70 mg by mouth once a week.   amoxicillin  500 MG capsule Commonly known as: AMOXIL  Take 2,000 mg by mouth See admin instructions. Dental procedures   aspirin EC 81 MG tablet Take 81 mg by mouth daily. Swallow whole.   atorvastatin  40 MG tablet Commonly known as: LIPITOR Take 40 mg by mouth at bedtime.   calcium  carbonate 750 MG chewable tablet Commonly known as: TUMS EX Chew 750 mg by mouth daily as needed for indigestion or heartburn.   Caverject  Impulse 20 MCG injection Generic drug: alprostadil  INJECT 20 MCG BY INTRACAVITARY  ROUTE AS DIRECTED AS NEEDED FOR  ERECTILE DYSFUNCTION USE NO MORE THAN 3 TIMES WEEKLY    cetirizine-pseudoephedrine 5-120 MG tablet Commonly known as: ZYRTEC-D Take 1 tablet by mouth daily as needed for allergies.   cyclobenzaprine  5 MG tablet Commonly known as: FLEXERIL  Take 5-10 mg by mouth at bedtime as needed for muscle spasms.   diltiazem  2 % Gel Apply 1 Application topically at bedtime.   emtricitabine -rilpivir-tenofovir  AF 200-25-25 MG Tabs tablet Commonly known as: ODEFSEY  Take 1 tablet by mouth daily.   gabapentin  300 MG capsule Commonly known as: NEURONTIN  Take 300 mg by mouth 3 (three) times daily.   hydrochlorothiazide  50 MG tablet Commonly known as: HYDRODIURIL  TAKE 1 TABLET BY MOUTH DAILY   HYDROcodone -acetaminophen  7.5-325 MG tablet Commonly known as: NORCO Take 1 tablet by mouth every 6 (six) hours as needed for severe pain (pain score 7-10).   hydrocortisone  25 MG suppository Commonly known as: ANUSOL -HC Place 1 suppository (25 mg total) rectally 2 (two) times daily. What changed: when to take this   Linzess  290 MCG Caps capsule Generic drug: linaclotide  TAKE 1 CAPSULE BY MOUTH DAILY  BEFORE BREAKFAST   Melatonin 10 MG Tabs Take 10 mg by mouth at bedtime.   methocarbamol  500 MG tablet Commonly known as: ROBAXIN  Take 500 mg by mouth every 6 (six) hours as needed for muscle spasms. What changed: Another medication with the same name was added. Make sure you understand how and when to take each.   methocarbamol  500 MG tablet Commonly known as: ROBAXIN  Take 1 tablet (500 mg total) by mouth every 6 (six) hours as needed for muscle spasms. What changed: You were already taking a medication with the same name, and this prescription was added. Make sure you understand how and when to take each.   methylPREDNISolone  4 MG Tbpk tablet Commonly known as: MEDROL  DOSEPAK Take by mouth.   montelukast  10 MG tablet Commonly known as: SINGULAIR  TAKE 1 TABLET BY MOUTH DAILY   MULTIVITAMIN PO Take 1 tablet by mouth daily.    oxyCODONE -acetaminophen  5-325 MG tablet Commonly known as: PERCOCET/ROXICET Take 1-2 tablets by mouth every 4 (four) hours as needed for severe pain (pain score 7-10) (1 tablet for pain score 7-8, 2 tablets for pain score 9-10).   polyethylene glycol powder 17 GM/SCOOP powder Commonly known as: GLYCOLAX /MIRALAX  Take 17 g by mouth daily as needed for severe constipation, moderate constipation or mild constipation.   potassium chloride  10 MEQ tablet Commonly known as: KLOR-CON  M Take 1 tablet (10 mEq total) by mouth daily.   ramelteon  8 MG tablet Commonly known as: Rozerem  Take 1 tablet (8 mg total) by mouth at bedtime.   Restasis  0.05 % ophthalmic emulsion Generic drug: cycloSPORINE  Place 1 drop into both eyes  daily.   testosterone  enanthate 200 MG/ML injection Commonly known as: DELATESTRYL  INJECT 1 ML INTO THE MUSCLE EVERY 14 DAYS. DISCARD VIAL 28 DAYS AFTER PUNCTURED.   THERATEARS OP Apply 1 drop to eye as needed (Dry eye).        Diagnostic Studies: DG CHEST PORT 1 VIEW Addendum Date: 03/13/2024 ADDENDUM REPORT: 03/13/2024 11:36 ADDENDUM: Critical Value/emergent results were called by telephone at the time of interpretation on 03/13/2024 at 11:36 am to provider Charmian Forbis , who verbally acknowledged these results. Electronically Signed   By: Lynwood Landy Raddle M.D.   On: 03/13/2024 11:36   Result Date: 03/13/2024 CLINICAL DATA:  Postoperative chest pain. EXAM: PORTABLE CHEST 1 VIEW COMPARISON:  March 12, 2024. FINDINGS: The heart size and mediastinal contours are within normal limits. Small right apical pneumothorax is noted which is not significantly changed compared to prior exam. Left lung is clear. The visualized skeletal structures are unremarkable. IMPRESSION: Stable small right apical pneumothorax. Electronically Signed: By: Lynwood Landy Raddle M.D. On: 03/13/2024 10:51   DG Lumbar Spine 2-3 Views Result Date: 03/13/2024 CLINICAL DATA:  461500 Elective surgery 461500.  EXAM: LUMBAR SPINE - 1 VIEW; LUMBAR SPINE - 2-3 VIEW COMPARISON:  None Available. FINDINGS: Multiple spot radiographs during various phases of spinal fusion surgery were submitted under 2 different exams. Multiple spinal fixation hardware noted along with intervertebral disc spacers. On the lateral cross-table radiograph, there are L3 through L5 transpedicular screws/rods with L2-3 through L4-5 disc spacers. On the subsequent exam, there is an additional upper lumbar spinal fixation hardware, not well seen. Please refer to the separately issued operative report for further details. Fluoroscopy time: 23 seconds Radiation dose: 9.67 mGy IMPRESSION: Intraoperative fluoroscopy provided during spinal fusion surgery. Electronically Signed   By: Ree Molt M.D.   On: 03/13/2024 10:23   DG Lumbar Spine 1 View Result Date: 03/13/2024 CLINICAL DATA:  461500 Elective surgery 461500. EXAM: LUMBAR SPINE - 1 VIEW; LUMBAR SPINE - 2-3 VIEW COMPARISON:  None Available. FINDINGS: Multiple spot radiographs during various phases of spinal fusion surgery were submitted under 2 different exams. Multiple spinal fixation hardware noted along with intervertebral disc spacers. On the lateral cross-table radiograph, there are L3 through L5 transpedicular screws/rods with L2-3 through L4-5 disc spacers. On the subsequent exam, there is an additional upper lumbar spinal fixation hardware, not well seen. Please refer to the separately issued operative report for further details. Fluoroscopy time: 23 seconds Radiation dose: 9.67 mGy IMPRESSION: Intraoperative fluoroscopy provided during spinal fusion surgery. Electronically Signed   By: Ree Molt M.D.   On: 03/13/2024 10:23   DG C-Arm 1-60 Min-No Report Result Date: 03/13/2024 Fluoroscopy was utilized by the requesting physician.  No radiographic interpretation.   DG C-Arm 1-60 Min-No Report Result Date: 03/13/2024 Fluoroscopy was utilized by the requesting physician.  No  radiographic interpretation.   DG Lumbar Spine 2-3 Views Result Date: 03/12/2024 CLINICAL DATA:  461500 Elective surgery 461500 EXAM: LUMBAR SPINE - 2-3 VIEW COMPARISON:  CT 03/04/2024 FINDINGS: Two fluoroscopic spot views of the lumbar spine submitted from the operating room. Previous L3 through L5 fusion with interbody spacers. New interbody spacer at the L2-L3 level. Fluoroscopy time 1 minutes 53 seconds. Dose 43.18 mGy. IMPRESSION: Intraoperative fluoroscopy during lumbar surgery. Electronically Signed   By: Andrea Gasman M.D.   On: 03/12/2024 12:41   DG Chest 1 View Result Date: 03/12/2024 CLINICAL DATA:  Postoperative pain EXAM: CHEST  1 VIEW COMPARISON:  None Available. FINDINGS: The  heart size and mediastinal contours are within normal limits. Both lungs are clear. Incidental is made of nipple shadow true changes. The visualized skeletal structures are unremarkable. IMPRESSION: No active disease. Electronically Signed   By: Wilkie Lent M.D.   On: 03/12/2024 11:21   DG C-Arm 1-60 Min-No Report Result Date: 03/12/2024 Fluoroscopy was utilized by the requesting physician.  No radiographic interpretation.   DG C-Arm 1-60 Min-No Report Result Date: 03/12/2024 Fluoroscopy was utilized by the requesting physician.  No radiographic interpretation.   CT LUMBAR SPINE WO CONTRAST Result Date: 03/06/2024 CLINICAL DATA:  Back surgery last year with residual/recurrent pain EXAM: CT LUMBAR SPINE WITHOUT CONTRAST TECHNIQUE: Multidetector CT imaging of the lumbar spine was performed without intravenous contrast administration. Multiplanar CT image reconstructions were also generated. RADIATION DOSE REDUCTION: This exam was performed according to the departmental dose-optimization program which includes automated exposure control, adjustment of the mA and/or kV according to patient size and/or use of iterative reconstruction technique. COMPARISON:  MRI 12/20/2023 FINDINGS: Segmentation: 5 lumbar type  vertebral bodies. Alignment: Scoliotic curvature convex to the right with the apex at L3. No listhesis. Vertebrae: Distant discectomy and fusion procedure from L3 through L5. Satisfactory appearance without evidence of nonunion or hardware failure. Paraspinal and other soft tissues: Negative Disc levels: No significant finding from T11-12 through L1-2. L2-3: Disc degeneration more pronounced on the left. Large disc herniation centrally and to the left of midline with caudal migration down behind L3. This is likely to cause left-sided neural compression. L3-4 and L4-5: Satisfactory postsurgical appearance. Limited detail because of artifact related to fusion hardware. L5-S1: Mild bulging of the disc. No compressive canal or foraminal narrowing. IMPRESSION: 1. Satisfactory postsurgical appearance at L3-4 and L4-5. 2. L2-3: Disc degeneration more pronounced on the left. Large disc herniation centrally and to the left of midline with caudal migration down behind L3. This is likely to cause left-sided neural compression. 3. L5-S1: Mild bulging of the disc. No compressive canal or foraminal narrowing. Electronically Signed   By: Oneil Officer M.D.   On: 03/06/2024 14:15    Disposition: Discharge disposition: 01-Home or Self Care        Pt s/p lumbar decompression and fusion, doing well   - up with PT/OT, encourage ambulation - Percocet for pain, Robaxin  for muscle spasms - likely d/c home today with f/u in 2 weeks - small expected stable pneumothorax noted on xrays. Will follow outpatient. IS spirometry encouraged -Scripts for pain sent to pharmacy electronically  -D/C instructions sheet printed and in chart -D/C today  -F/U in office 2 weeks   Signed: Shaylah Mcghie J Zaynah Chawla 03/26/2024, 12:56 PM

## 2024-03-27 ENCOUNTER — Encounter: Payer: Self-pay | Admitting: Internal Medicine

## 2024-05-04 ENCOUNTER — Other Ambulatory Visit: Payer: Self-pay | Admitting: Internal Medicine

## 2024-05-04 ENCOUNTER — Encounter: Payer: Self-pay | Admitting: Internal Medicine

## 2024-05-10 ENCOUNTER — Other Ambulatory Visit: Payer: Self-pay | Admitting: Internal Medicine

## 2024-05-10 MED ORDER — MONTELUKAST SODIUM 10 MG PO TABS: 10.0000 mg | ORAL_TABLET | Freq: Every day | ORAL | 3 refills | Status: AC

## 2024-05-10 MED ORDER — RAMELTEON 8 MG PO TABS: 8.0000 mg | ORAL_TABLET | Freq: Every day | ORAL | 1 refills | Status: DC

## 2024-05-10 MED ORDER — CAVERJECT IMPULSE 20 MCG IC KIT: 20.0000 ug | PACK | INTRACAVERNOUS | 3 refills | Status: DC | PRN

## 2024-05-28 ENCOUNTER — Other Ambulatory Visit (INDEPENDENT_AMBULATORY_CARE_PROVIDER_SITE_OTHER)

## 2024-05-28 ENCOUNTER — Ambulatory Visit: Payer: Self-pay | Admitting: Internal Medicine

## 2024-05-28 DIAGNOSIS — E291 Testicular hypofunction: Secondary | ICD-10-CM

## 2024-05-28 DIAGNOSIS — D751 Secondary polycythemia: Secondary | ICD-10-CM | POA: Diagnosis not present

## 2024-05-28 DIAGNOSIS — Z21 Asymptomatic human immunodeficiency virus [HIV] infection status: Secondary | ICD-10-CM

## 2024-05-28 LAB — CBC WITH DIFFERENTIAL/PLATELET
Basophils Absolute: 0 K/uL (ref 0.0–0.1)
Basophils Relative: 0.6 % (ref 0.0–3.0)
Eosinophils Absolute: 0 K/uL (ref 0.0–0.7)
Eosinophils Relative: 0.7 % (ref 0.0–5.0)
HCT: 40.1 % (ref 39.0–52.0)
Hemoglobin: 13.7 g/dL (ref 13.0–17.0)
Lymphocytes Relative: 25.5 % (ref 12.0–46.0)
Lymphs Abs: 1.2 K/uL (ref 0.7–4.0)
MCHC: 34.2 g/dL (ref 30.0–36.0)
MCV: 92.2 fl (ref 78.0–100.0)
Monocytes Absolute: 0.4 K/uL (ref 0.1–1.0)
Monocytes Relative: 8.9 % (ref 3.0–12.0)
Neutro Abs: 2.9 K/uL (ref 1.4–7.7)
Neutrophils Relative %: 64.3 % (ref 43.0–77.0)
Platelets: 178 K/uL (ref 150.0–400.0)
RBC: 4.35 Mil/uL (ref 4.22–5.81)
RDW: 14.5 % (ref 11.5–15.5)
WBC: 4.6 K/uL (ref 4.0–10.5)

## 2024-05-28 LAB — COMPREHENSIVE METABOLIC PANEL WITH GFR
ALT: 11 U/L (ref 0–53)
AST: 16 U/L (ref 0–37)
Albumin: 3.9 g/dL (ref 3.5–5.2)
Alkaline Phosphatase: 58 U/L (ref 39–117)
BUN: 14 mg/dL (ref 6–23)
CO2: 33 meq/L — ABNORMAL HIGH (ref 19–32)
Calcium: 9 mg/dL (ref 8.4–10.5)
Chloride: 96 meq/L (ref 96–112)
Creatinine, Ser: 0.95 mg/dL (ref 0.40–1.50)
GFR: 78.95 mL/min (ref >60.00)
Glucose, Bld: 89 mg/dL (ref 70–99)
Potassium: 4.8 meq/L (ref 3.5–5.1)
Sodium: 135 meq/L (ref 135–145)
Total Bilirubin: 0.6 mg/dL (ref 0.2–1.2)
Total Protein: 5.7 g/dL — ABNORMAL LOW (ref 6.0–8.3)

## 2024-05-28 LAB — LIPID PANEL
Cholesterol: 138 mg/dL (ref 0–200)
HDL: 70.3 mg/dL (ref >39.00)
LDL Cholesterol: 53 mg/dL (ref 0–99)
NonHDL: 67.78
Total CHOL/HDL Ratio: 2
Triglycerides: 72 mg/dL (ref 0.0–149.0)
VLDL: 14.4 mg/dL (ref 0.0–40.0)

## 2024-05-28 LAB — URINALYSIS
Bilirubin Urine: NEGATIVE
Hgb urine dipstick: NEGATIVE
Ketones, ur: NEGATIVE
Leukocytes,Ua: NEGATIVE
Nitrite: NEGATIVE
Specific Gravity, Urine: 1.01 (ref 1.000–1.030)
Total Protein, Urine: NEGATIVE
Urine Glucose: NEGATIVE
Urobilinogen, UA: 1 (ref 0.0–1.0)
pH: 7 (ref 5.0–8.0)

## 2024-05-28 LAB — TSH: TSH: 2.15 u[IU]/mL (ref 0.35–5.50)

## 2024-05-28 LAB — PSA: PSA: 4.43 ng/mL — ABNORMAL HIGH (ref 0.10–4.00)

## 2024-05-28 LAB — TESTOSTERONE: Testosterone: 42.51 ng/dL — ABNORMAL LOW (ref 300.00–890.00)

## 2024-05-30 ENCOUNTER — Ambulatory Visit
Admission: RE | Admit: 2024-05-30 | Discharge: 2024-05-30 | Disposition: A | Discharge status: Home / Self Care | Source: Ambulatory Visit | Attending: Radiation Oncology | Admitting: Radiation Oncology

## 2024-05-30 ENCOUNTER — Encounter: Payer: Self-pay | Admitting: Radiation Oncology

## 2024-05-30 VITALS — BP 153/82 | HR 56 | Temp 97.9°F | Resp 20 | Ht 70.0 in | Wt 134.8 lb

## 2024-05-30 DIAGNOSIS — Z809 Family history of malignant neoplasm, unspecified: Secondary | ICD-10-CM | POA: Diagnosis not present

## 2024-05-30 DIAGNOSIS — B20 Human immunodeficiency virus [HIV] disease: Secondary | ICD-10-CM | POA: Diagnosis not present

## 2024-05-30 DIAGNOSIS — K5900 Constipation, unspecified: Secondary | ICD-10-CM | POA: Insufficient documentation

## 2024-05-30 DIAGNOSIS — Z79624 Long term (current) use of inhibitors of nucleotide synthesis: Secondary | ICD-10-CM | POA: Insufficient documentation

## 2024-05-30 DIAGNOSIS — C44219 Basal cell carcinoma of skin of left ear and external auricular canal: Secondary | ICD-10-CM | POA: Insufficient documentation

## 2024-05-30 DIAGNOSIS — M129 Arthropathy, unspecified: Secondary | ICD-10-CM | POA: Diagnosis not present

## 2024-05-30 DIAGNOSIS — Z79899 Other long term (current) drug therapy: Secondary | ICD-10-CM | POA: Insufficient documentation

## 2024-05-30 DIAGNOSIS — Z85828 Personal history of other malignant neoplasm of skin: Secondary | ICD-10-CM | POA: Diagnosis not present

## 2024-05-30 DIAGNOSIS — Z7982 Long term (current) use of aspirin: Secondary | ICD-10-CM | POA: Diagnosis not present

## 2024-05-30 DIAGNOSIS — Z7983 Long term (current) use of bisphosphonates: Secondary | ICD-10-CM | POA: Diagnosis not present

## 2024-05-30 DIAGNOSIS — I10 Essential (primary) hypertension: Secondary | ICD-10-CM | POA: Insufficient documentation

## 2024-05-30 DIAGNOSIS — Z87891 Personal history of nicotine dependence: Secondary | ICD-10-CM | POA: Diagnosis not present

## 2024-05-30 DIAGNOSIS — K589 Irritable bowel syndrome without diarrhea: Secondary | ICD-10-CM | POA: Insufficient documentation

## 2024-05-30 DIAGNOSIS — E785 Hyperlipidemia, unspecified: Secondary | ICD-10-CM | POA: Diagnosis not present

## 2024-05-30 NOTE — Progress Notes (Signed)
 Radiation Oncology         [phone] ________________________________  Initial outpatient Consultation  Name: Troy Marsh MRN: [id number]  Date: 05/30/2024  DOB: 11/11/1950  RR:Eonuwpxnc, Karlynn GAILS, MD  Lynnell Nottingham, MD   REFERRING PHYSICIAN: Lynnell Nottingham, MD  DIAGNOSIS:    ICD-10-CM   1. 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 male who presented with history of tanning bed use and significant sun exposure.  He has undergone numerous treatments in dermatology, including excisions and Mohs, for multiple skin cancers.  In March he underwent Mohs to the   helix of his left ear.  More recently he was found to have basal cell carcinoma adjacent to the surgical defect in the left superior concha  I personally discussed his case with Dr. Lynnell BUD FROM DERMATOLOGY:    PHOTO FROM TODAY -I have verified with Dr. Lynnell that the area in magenta would be a reasonable target for radiation as it includes both the helical surgical defect and the area of gross disease today       Whitworth, MD    PREVIOUS RADIATION THERAPY: No  PAST MEDICAL HISTORY:  has a past medical history of Arthritis, BPH (benign prostatic hyperplasia), Cancer (HCC), Closed fracture of nasal bones (06/02/2022), Constipation due to pain medication, HIV disease (HCC) (1985), Hyperlipidemia, Hypertension, Hypogonadism male, and IBS (irritable bowel syndrome).    PAST SURGICAL HISTORY: Past Surgical History:  Procedure Laterality Date   ANTERIOR  CERVICAL DECOMP/DISCECTOMY FUSION Bilateral 11/22/2017   Procedure: ANTERIOR CERVICAL DECOMPRESSION FUSION CERVICAL 4-5, CERVICAL 5-6, CERVICAL 6-7 WITH INSTRUMENTATION AND ALLOGRAFT TIME      /   REQUESTED 4 HOURS;  Surgeon: Beuford Anes, MD;  Location: [facility] OR;  Service: Orthopedics;  Laterality: Bilateral;   ANTERIOR LAT LUMBAR FUSION Left 02/14/2023   Procedure: LEFT SIDED LUMBAR THREE - LUMBAR FOUR, LUMBAR FOUR - LUMBAR FIVE LATERAL INTERBODY FUSION WITH INSTRUMENTATION AND ALLOGRAFT;  Surgeon: Beuford Anes, MD;  Location: [facility] OR;  Service: Orthopedics;  Laterality: Left;   ANTERIOR LAT LUMBAR FUSION Right 03/12/2024   Procedure: ANTERIOR LATERAL LUMBAR FUSION 1 LEVEL;  Surgeon: Beuford Anes, MD;  Location: [facility] OR;  Service: Orthopedics;  Laterality: Right;  RIGHT-SIDED LUMBAR 2 - LUMBAR 3 LATERAL INTERBODY FUSION WITH INSTRUMENTATION AND ALLOGRAFT   BACK SURGERY     COLONOSCOPY W/ POLYPECTOMY     EYE SURGERY Bilateral    Lasik, Cataract   HEMORRHOID SURGERY  1973   INGUINAL HERNIA REPAIR Right 06/11/2018   Procedure: LAPAROSCOPIC RIGHT INGUINAL HERNIA WITH MESH;  Surgeon: Rubin Calamity, MD;  Location: [facility] OR;  Service: General;  Laterality: Right;   INSERTION OF MESH Right 06/11/2018   Procedure: INSERTION OF MESH;  Surgeon: Rubin Calamity, MD;  Location: [facility] OR;  Service: General;  Laterality: Right;   KNEE ARTHROSCOPY Right 2009   KNEE ARTHROSCOPY Left 2011   neck surgery  1984   POSTERIOR CERVICAL FUSION/FORAMINOTOMY N/A 05/29/2018   Procedure: POSTERIOR CERVICAL DECOMPRESSION FUSION CERVICAL 4 - CERVICAL 7 WITH INSTRUMENTATION  AND ALLOGRAFT;  Surgeon: Beuford Anes, MD;  Location: [facility] OR;  Service: Orthopedics;  Laterality: N/A;   TOTAL KNEE ARTHROPLASTY Left 12/25/2023   Procedure: ARTHROPLASTY, KNEE, TOTAL;  Surgeon: Josefina Chew, MD;  Location: [facility] ORS;  Service: Orthopedics;  Laterality: Left;    FAMILY HISTORY: family history includes Cancer in his father; Colitis in his  maternal grandmother; Coronary artery disease in his father; Heart attack in his father; Hyperlipidemia in his father; Hypertension in his father.  SOCIAL HISTORY:  reports that he quit smoking about 20 years ago. His smoking use included cigarettes. He started smoking about 55 years ago. He has a 70 pack-year smoking history. He has never used smokeless tobacco. He reports that he does not drink alcohol  and does not use drugs.  ALLERGIES: Patient has no known allergies.  MEDICATIONS:  Current Outpatient Medications  Medication Sig Dispense Refill   acetaminophen  (TYLENOL ) 650 MG CR tablet Take 650 mg by mouth every 8 (eight) hours as needed for pain.     alendronate (FOSAMAX) 70 MG tablet Take 70 mg by mouth once a week.     alprostadil  (CAVERJECT  IMPULSE) 20 MCG injection 20 mcg by Intracavitary route as needed for erectile dysfunction. use no more than 3 times per weekINJECT 20 MCG BY INTRACAVITARY  ROUTE AS DIRECTED AS NEEDED FOR  ERECTILE DYSFUNCTION USE NO MORE THAN 3 TIMES WEEKLY 9 each 3   amoxicillin  (AMOXIL ) 500 MG capsule Take 2,000 mg by mouth See admin instructions. Dental procedures     aspirin EC 81 MG tablet Take 81 mg by mouth daily. Swallow whole.     atorvastatin  (LIPITOR) 40 MG tablet Take 40 mg by mouth at bedtime.      calcium  carbonate (TUMS EX) 750 MG chewable tablet Chew 750 mg by mouth daily as needed for indigestion or heartburn.     Carboxymethylcellulose Sodium (THERATEARS OP) Apply 1 drop to eye as needed (Dry eye).     cetirizine-pseudoephedrine (ZYRTEC-D) 5-120 MG tablet Take 1 tablet by mouth daily as needed for allergies.      cyclobenzaprine  (FLEXERIL ) 5 MG tablet Take 5-10 mg by mouth at bedtime as needed for muscle spasms. (Patient not taking: Reported on 03/05/2024)     diltiazem  2 % GEL Apply 1 Application topically at bedtime. 30 g 0   emtricitabine -rilpivir-tenofovir  AF (ODEFSEY ) 200-25-25 MG TABS tablet Take 1 tablet by mouth daily.     gabapentin   (NEURONTIN ) 300 MG capsule Take 300 mg by mouth 3 (three) times daily.     hydrochlorothiazide  (HYDRODIURIL ) 50 MG tablet TAKE 1 TABLET BY MOUTH DAILY 90 tablet 3   HYDROcodone -acetaminophen  (NORCO) 7.5-325 MG tablet Take 1 tablet by mouth every 6 (six) hours as needed for severe pain (pain score 7-10). 120 tablet 0   hydrocortisone  (ANUSOL -HC) 25 MG suppository Place 1 suppository (25 mg total) rectally 2 (two) times daily. (Patient taking differently: Place 25 mg rectally daily.) 180 suppository 1   LINZESS  290 MCG CAPS capsule TAKE 1 CAPSULE BY MOUTH DAILY  BEFORE BREAKFAST 90 capsule 4   Melatonin 10 MG TABS Take 10 mg by mouth at bedtime.     methocarbamol  (ROBAXIN ) 500 MG tablet Take 500 mg by mouth every 6 (six) hours as needed for muscle spasms.     methocarbamol  (ROBAXIN ) 500 MG tablet Take 1 tablet (500 mg total) by mouth every 6 (six) hours as needed for muscle spasms. 30 tablet 2   methylPREDNISolone  (MEDROL  DOSEPAK) 4 MG TBPK tablet Take by  mouth. (Patient not taking: Reported on 03/05/2024)     montelukast  (SINGULAIR ) 10 MG tablet Take 1 tablet (10 mg total) by mouth daily. 90 tablet 3   Multiple Vitamin (MULTIVITAMIN PO) Take 1 tablet by mouth daily.     oxyCODONE -acetaminophen  (PERCOCET/ROXICET) 5-325 MG tablet Take 1-2 tablets by mouth every 4 (four) hours as needed for severe pain (pain score 7-10) (1 tablet for pain score 7-8, 2 tablets for pain score 9-10). 30 tablet 0   polyethylene glycol powder (GLYCOLAX /MIRALAX ) powder Take 17 g by mouth daily as needed for severe constipation, moderate constipation or mild constipation.     potassium chloride  (KLOR-CON  M) 10 MEQ tablet Take 1 tablet (10 mEq total) by mouth daily. 90 tablet 3   ramelteon  (ROZEREM ) 8 MG tablet Take 1 tablet (8 mg total) by mouth at bedtime. 90 tablet 1   RESTASIS  0.05 % ophthalmic emulsion Place 1 drop into both eyes daily.     testosterone  enanthate (DELATESTRYL ) 200 MG/ML injection INJECT 1 ML INTO THE MUSCLE  EVERY 14 DAYS. DISCARD VIAL 28 DAYS AFTER PUNCTURED. 10 mL 1   No current facility-administered medications for this visit.    REVIEW OF SYSTEMS:  Notable for that above.   PHYSICAL EXAM:  vitals were not taken for this visit.   General: Alert and oriented, in no acute distress   HEENT: Head is normocephalic. Extraocular movements are intact.  Neck: Neck is supple, no palpable cervical or supraclavicular lymphadenopathy.  No periauricular adenopathy.  Extremities: No cyanosis or edema. Lymphatics: see Neck Exam Skin: Small lesion <1cm in the left ear superior Concha consistent with photograph above Musculoskeletal: symmetric strength and muscle tone throughout.  Ambulatory Neurologic: No obvious focalities. Speech is fluent. Coordination is intact. Psychiatric: Judgment and insight are intact. Affect is appropriate.  See photographs above ECOG = 0  0 - Asymptomatic (Fully active, able to carry on all predisease activities without restriction)  1 - Symptomatic but completely ambulatory (Restricted in physically strenuous activity but ambulatory and able to carry out work of a light or sedentary nature. For example, light housework, office work)  2 - Symptomatic, <50% in bed during the day (Ambulatory and capable of all self care but unable to carry out any work activities. Up and about more than 50% of waking hours)  3 - Symptomatic, >50% in bed, but not bedbound (Capable of only limited self-care, confined to bed or chair 50% or more of waking hours)  4 - Bedbound (Completely disabled. Cannot carry on any self-care. Totally confined to bed or chair)  5 - Death   Raylene MM, Creech RH, Tormey DC, et al. [phone]). Toxicity and response criteria of the [facility]. Am. DOROTHA Bridges. Oncol. 5 (6): 649-55   LABORATORY DATA:  Lab Results  Component Value Date   WBC 4.6 05/28/2024   HGB 13.7 05/28/2024   HCT 40.1 05/28/2024   MCV 92.2 05/28/2024   PLT 178.0  05/28/2024   CMP     Component Value Date/Time   NA 135 05/28/2024 1117   NA 141 09/30/2012 0000   K 4.8 05/28/2024 1117   CL 96 05/28/2024 1117   CO2 33 (H) 05/28/2024 1117   GLUCOSE 89 05/28/2024 1117   GLUCOSE 96 05/21/2006 0749   BUN 14 05/28/2024 1117   BUN 15 09/30/2012 0000   CREATININE 0.95 05/28/2024 1117   CALCIUM  9.0 05/28/2024 1117   PROT 5.7 (L) 05/28/2024 1117   ALBUMIN  3.9 05/28/2024 1117  AST 16 05/28/2024 1117   ALT 11 05/28/2024 1117   ALKPHOS 58 05/28/2024 1117   BILITOT 0.6 05/28/2024 1117   GFR 78.95 05/28/2024 1117   GFRNONAA >60 03/10/2024 0900         RADIOGRAPHY: No results found.    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   It was a pleasure meeting the patient today. I recommend electron radiotherapy.  I have personally reviewed his case with Dr. Lynnell today to enhance multidisciplinary care.  He agrees that the area in magenta above, pertaining to the attached photograph, would be a reasonable target for radiation therapy so that we not only treat the area of active disease but prophylactically treat the surgical bed where he most recently underwent Mohs to the left ear helix   We discussed the nature of basal cell carcinoma and the role radiotherapy plays in treatment today. We reviewed the benefits, risks, and possible side effects from the treatment. Possible side effects include fatigue and skin irritation with peeling, bleeding, and or scabbing. Some permanent skin changes may occur, but cosmesis is generally favorable after radiation therapy.  Nonhealing wound to cartilage/ear is rare.  Secondary malignancy is extremely rare. Patient expressed understanding of the treatment which is of curative intent. All questions were answered to their stated satisfaction. No guarantees of treatment given. A consent form was signed and placed in their chart today.   We will schedule the patient for CT simulation. Anticipate 4 weeks of radiation to follow.   On date of service, in total, I spent 60 minutes on this encounter. Patient was seen in person.   __________________________________________   Domino Izell, MD

## 2024-05-30 NOTE — Progress Notes (Signed)
 Histology and Location of Primary Skin Cancer:  Basal Cell Carcinoma of Left Ear  Troy Marsh presented with the following signs/symptoms   Biopsies:   Past/Anticipated interventions by patient's surgeon/dermatologist for current problematic lesion, if any:  05/27/2024 Lynnell, MD  History of Blistering sunburns, if any:  Patient uses tanning bed and sunbathes regularly, has a history of significant sun exposure.  SAFETY ISSUES: Prior radiation? None Pacemaker/ICD? None Possible current pregnancy? None Is the patient on methotrexate? None  Current Complaints / other details:  None

## 2024-05-31 ENCOUNTER — Other Ambulatory Visit: Payer: Self-pay | Admitting: Gastroenterology

## 2024-06-02 ENCOUNTER — Telehealth: Payer: Self-pay | Admitting: Radiation Oncology

## 2024-06-02 NOTE — Telephone Encounter (Signed)
 11/17 Received call from Dr. Neil office spoke to [place], requested MD's notes.  Faxed medical record for continuity of care.

## 2024-06-03 ENCOUNTER — Ambulatory Visit: Admitting: Internal Medicine

## 2024-06-03 ENCOUNTER — Encounter: Payer: Self-pay | Admitting: Internal Medicine

## 2024-06-03 VITALS — BP 134/70 | HR 52 | Temp 97.8°F | Ht 70.0 in | Wt 136.2 lb

## 2024-06-03 DIAGNOSIS — E291 Testicular hypofunction: Secondary | ICD-10-CM

## 2024-06-03 DIAGNOSIS — G8929 Other chronic pain: Secondary | ICD-10-CM

## 2024-06-03 DIAGNOSIS — M545 Low back pain, unspecified: Secondary | ICD-10-CM | POA: Diagnosis not present

## 2024-06-03 DIAGNOSIS — M5416 Radiculopathy, lumbar region: Secondary | ICD-10-CM

## 2024-06-03 DIAGNOSIS — R972 Elevated prostate specific antigen [PSA]: Secondary | ICD-10-CM | POA: Diagnosis not present

## 2024-06-03 DIAGNOSIS — M170 Bilateral primary osteoarthritis of knee: Secondary | ICD-10-CM | POA: Diagnosis not present

## 2024-06-03 MED ORDER — CAVERJECT IMPULSE 20 MCG IC KIT: 20.0000 ug | PACK | INTRACAVERNOUS | 3 refills | Status: DC | PRN

## 2024-06-03 MED ORDER — RAMELTEON 8 MG PO TABS: 8.0000 mg | ORAL_TABLET | Freq: Every day | ORAL | 1 refills | Status: DC

## 2024-06-03 MED ORDER — RAMELTEON 8 MG PO TABS: 8.0000 mg | ORAL_TABLET | Freq: Every day | ORAL | 1 refills | Status: AC

## 2024-06-03 NOTE — Progress Notes (Signed)
 Subjective:  Patient ID: Troy Marsh, male    DOB: Aug 07, 1950  Age: 73 y.o. MRN: [id number]  CC: Medical Management of Chronic Issues (6 Month follow up)   HPI Troy Marsh presents for s/p POSTERIOR LUMBAR FUSION 1 LEVEL, LUMBAR TWO -LUMBAR THREE in 02/2024 - Dr Beuford TKR left -  in June 2025 Dr Troy Marsh    Outpatient Medications Prior to Visit  Medication Sig Dispense Refill   acetaminophen  (TYLENOL ) 650 MG CR tablet Take 650 mg by mouth every 8 (eight) hours as needed for pain.     alendronate (FOSAMAX) 70 MG tablet Take 70 mg by mouth once a week.     amoxicillin  (AMOXIL ) 500 MG capsule Take 2,000 mg by mouth See admin instructions. Dental procedures     aspirin EC 81 MG tablet Take 81 mg by mouth daily. Swallow whole.     atorvastatin  (LIPITOR) 40 MG tablet Take 40 mg by mouth at bedtime.      calcium  carbonate (TUMS EX) 750 MG chewable tablet Chew 750 mg by mouth daily as needed for indigestion or heartburn.     Carboxymethylcellulose Sodium (THERATEARS OP) Apply 1 drop to eye as needed (Dry eye).     cetirizine-pseudoephedrine (ZYRTEC-D) 5-120 MG tablet Take 1 tablet by mouth daily as needed for allergies.      cyclobenzaprine  (FLEXERIL ) 5 MG tablet Take 5-10 mg by mouth at bedtime as needed for muscle spasms.     emtricitabine -rilpivir-tenofovir  AF (ODEFSEY ) 200-25-25 MG TABS tablet Take 1 tablet by mouth daily.     gabapentin  (NEURONTIN ) 300 MG capsule Take 300 mg by mouth 3 (three) times daily.     hydrochlorothiazide  (HYDRODIURIL ) 50 MG tablet TAKE 1 TABLET BY MOUTH DAILY 90 tablet 3   HYDROcodone -acetaminophen  (NORCO) 7.5-325 MG tablet Take 1 tablet by mouth every 6 (six) hours as needed for severe pain (pain score 7-10). 120 tablet 0   hydrocortisone  (ANUSOL -HC) 25 MG suppository Place 1 suppository (25 mg total) rectally 2 (two) times daily. 180 suppository 1   LINZESS  290 MCG CAPS capsule TAKE 1 CAPSULE BY MOUTH DAILY  BEFORE BREAKFAST 90 capsule 4   Melatonin  10 MG TABS Take 10 mg by mouth at bedtime.     methocarbamol  (ROBAXIN ) 500 MG tablet Take 1 tablet (500 mg total) by mouth every 6 (six) hours as needed for muscle spasms. 30 tablet 2   montelukast  (SINGULAIR ) 10 MG tablet Take 1 tablet (10 mg total) by mouth daily. 90 tablet 3   Multiple Vitamin (MULTIVITAMIN PO) Take 1 tablet by mouth daily.     oxyCODONE -acetaminophen  (PERCOCET/ROXICET) 5-325 MG tablet Take 1-2 tablets by mouth every 4 (four) hours as needed for severe pain (pain score 7-10) (1 tablet for pain score 7-8, 2 tablets for pain score 9-10). 30 tablet 0   polyethylene glycol powder (GLYCOLAX /MIRALAX ) powder Take 17 g by mouth daily as needed for severe constipation, moderate constipation or mild constipation.     potassium chloride  (KLOR-CON  M) 10 MEQ tablet Take 1 tablet (10 mEq total) by mouth daily. 90 tablet 3   RESTASIS  0.05 % ophthalmic emulsion Place 1 drop into both eyes daily.     testosterone  enanthate (DELATESTRYL ) 200 MG/ML injection INJECT 1 ML INTO THE MUSCLE EVERY 14 DAYS. DISCARD VIAL 28 DAYS AFTER PUNCTURED. 10 mL 1   alprostadil  (CAVERJECT  IMPULSE) 20 MCG injection 20 mcg by Intracavitary route as needed for erectile dysfunction. use no more than 3 times per weekINJECT 20 MCG  BY INTRACAVITARY  ROUTE AS DIRECTED AS NEEDED FOR  ERECTILE DYSFUNCTION USE NO MORE THAN 3 TIMES WEEKLY 9 each 3   ramelteon  (ROZEREM ) 8 MG tablet Take 1 tablet (8 mg total) by mouth at bedtime. 90 tablet 1   diltiazem  2 % GEL Apply 1 Application topically at bedtime. (Patient not taking: Reported on 06/03/2024) 30 g 0   methocarbamol  (ROBAXIN ) 500 MG tablet Take 500 mg by mouth every 6 (six) hours as needed for muscle spasms.     methylPREDNISolone  (MEDROL  DOSEPAK) 4 MG TBPK tablet Take by mouth. (Patient not taking: Reported on 06/03/2024)     No facility-administered medications prior to visit.    ROS: Review of Systems  Constitutional:  Negative for appetite change, fatigue and unexpected  weight change.  HENT:  Negative for congestion, nosebleeds, sneezing, sore throat and trouble swallowing.   Eyes:  Negative for itching and visual disturbance.  Respiratory:  Negative for cough.   Cardiovascular:  Negative for chest pain, palpitations and leg swelling.  Gastrointestinal:  Negative for abdominal distention, blood in stool, diarrhea and nausea.  Genitourinary:  Negative for frequency and hematuria.  Musculoskeletal:  Positive for arthralgias, back pain and gait problem. Negative for joint swelling and neck pain.  Skin:  Negative for rash.  Neurological:  Negative for dizziness, tremors, speech difficulty and weakness.  Psychiatric/Behavioral:  Negative for agitation, dysphoric mood, sleep disturbance and suicidal ideas. The patient is not nervous/anxious.     Objective:  BP 134/70   Pulse (!) 52   Temp 97.8 F (36.6 C)   Ht 5' 10 (1.778 m)   Wt 136 lb 3.2 oz (61.8 kg)   SpO2 98%   BMI 19.54 kg/m   BP Readings from Last 3 Encounters:  06/03/24 134/70  05/30/24 (!) 153/82  03/14/24 110/69    Wt Readings from Last 3 Encounters:  06/03/24 136 lb 3.2 oz (61.8 kg)  05/30/24 134 lb 12.8 oz (61.1 kg)  03/13/24 (P) 128 lb 9.6 oz (58.3 kg)    Physical Exam Constitutional:      General: He is not in acute distress.    Appearance: He is well-developed.     Comments: NAD  Eyes:     Conjunctiva/sclera: Conjunctivae normal.     Pupils: Pupils are equal, round, and reactive to light.  Neck:     Thyroid : No thyromegaly.     Vascular: No JVD.  Cardiovascular:     Rate and Rhythm: Normal rate and regular rhythm.     Heart sounds: Normal heart sounds. No murmur heard.    No friction rub. No gallop.  Pulmonary:     Effort: Pulmonary effort is normal. No respiratory distress.     Breath sounds: Normal breath sounds. No wheezing or rales.  Chest:     Chest wall: No tenderness.  Abdominal:     General: Bowel sounds are normal. There is no distension.     Palpations:  Abdomen is soft. There is no mass.     Tenderness: There is no abdominal tenderness. There is no guarding or rebound.  Musculoskeletal:        General: Tenderness present. Normal range of motion.     Cervical back: Normal range of motion.     Right lower leg: No edema.     Left lower leg: No edema.  Lymphadenopathy:     Cervical: No cervical adenopathy.  Skin:    General: Skin is warm and dry.     Findings:  No rash.  Neurological:     Mental Status: He is alert and oriented to person, place, and time.     Cranial Nerves: No cranial nerve deficit.     Motor: No abnormal muscle tone.     Coordination: Coordination normal.     Gait: Gait normal.     Deep Tendon Reflexes: Reflexes are normal and symmetric.  Psychiatric:        Behavior: Behavior normal.        Thought Content: Thought content normal.        Judgment: Judgment normal.   LS spine w/good ROM  Lab Results  Component Value Date   WBC 4.6 05/28/2024   HGB 13.7 05/28/2024   HCT 40.1 05/28/2024   PLT 178.0 05/28/2024   GLUCOSE 89 05/28/2024   CHOL 138 05/28/2024   TRIG 72.0 05/28/2024   HDL 70.30 05/28/2024   LDLDIRECT 113.3 05/21/2006   LDLCALC 53 05/28/2024   ALT 11 05/28/2024   AST 16 05/28/2024   NA 135 05/28/2024   K 4.8 05/28/2024   CL 96 05/28/2024   CREATININE 0.95 05/28/2024   BUN 14 05/28/2024   CO2 33 (H) 05/28/2024   TSH 2.15 05/28/2024   PSA 4.43 (H) 05/28/2024   INR 1.0 07/06/2020    No results found.  Assessment & Plan:   Problem List Items Addressed This Visit     Elevated PSA measurement   PSA>4 Re-test in 3 mo      Relevant Orders   Comprehensive metabolic panel with GFR   Hypogonadism male   Decorian re-started his testosterone  Walking 30 min/d      Relevant Orders   Comprehensive metabolic panel with GFR   Knee osteoarthritis   TKR left -  in June 2025 Dr Troy Marsh        Low back pain    s/p POSTERIOR LUMBAR FUSION 1 LEVEL, LUMBAR TWO -LUMBAR THREE in 02/2024 - Dr  Beuford      Radiculopathy of lumbar region - Primary    s/p POSTERIOR LUMBAR FUSION 1 LEVEL, LUMBAR TWO -LUMBAR THREE in 02/2024 - Dr Beuford Doing well      Relevant Medications   ramelteon  (ROZEREM ) 8 MG tablet   Other Relevant Orders   PSA      Meds ordered this encounter  Medications   DISCONTD: alprostadil  (CAVERJECT  IMPULSE) 20 MCG injection    Sig: 20 mcg by Intracavitary route as needed for erectile dysfunction. use no more than 3 times per weekINJECT 20 MCG BY INTRACAVITARY  ROUTE AS DIRECTED AS NEEDED FOR  ERECTILE DYSFUNCTION USE NO MORE THAN 3 TIMES WEEKLY    Dispense:  9 each    Refill:  3    Please send a replace/new response with 90-Day Supply if appropriate to maximize member benefit. Requesting 1 year supply.   DISCONTD: ramelteon  (ROZEREM ) 8 MG tablet    Sig: Take 1 tablet (8 mg total) by mouth at bedtime.    Dispense:  90 tablet    Refill:  1    Please send a replace/new response with 90-Day Supply if appropriate to maximize member benefit. Requesting 1 year supply.   alprostadil  (CAVERJECT  IMPULSE) 20 MCG injection    Sig: 20 mcg by Intracavitary route as needed for erectile dysfunction. use no more than 3 times per weekINJECT 20 MCG BY INTRACAVITARY  ROUTE AS DIRECTED AS NEEDED FOR  ERECTILE DYSFUNCTION USE NO MORE THAN 3 TIMES WEEKLY    Dispense:  9 each  Refill:  3    Please send a replace/new response with 90-Day Supply if appropriate to maximize member benefit. Requesting 1 year supply.   ramelteon  (ROZEREM ) 8 MG tablet    Sig: Take 1 tablet (8 mg total) by mouth at bedtime.    Dispense:  90 tablet    Refill:  1    Please send a replace/new response with 90-Day Supply if appropriate to maximize member benefit. Requesting 1 year supply.      Follow-up: Return in about 3 months (around 09/03/2024) for a follow-up visit.  Marolyn Noel, MD

## 2024-06-03 NOTE — Assessment & Plan Note (Signed)
 PSA>4 Re-test in 3 mo

## 2024-06-03 NOTE — Assessment & Plan Note (Signed)
 TKR left -  in June 2025 Dr Josefina

## 2024-06-03 NOTE — Assessment & Plan Note (Addendum)
 s/p POSTERIOR LUMBAR FUSION 1 LEVEL, LUMBAR TWO -LUMBAR THREE in 02/2024 - Dr Beuford Doing well

## 2024-06-03 NOTE — Assessment & Plan Note (Signed)
 Troy Marsh re-started his testosterone  Walking 30 min/d

## 2024-06-03 NOTE — Assessment & Plan Note (Signed)
 s/p POSTERIOR LUMBAR FUSION 1 LEVEL, LUMBAR TWO -LUMBAR THREE in 02/2024 - Dr Beuford

## 2024-06-08 ENCOUNTER — Other Ambulatory Visit: Payer: Self-pay | Admitting: Internal Medicine

## 2024-06-08 MED ORDER — HYDROCODONE-ACETAMINOPHEN 7.5-325 MG PO TABS: 1.0000 | ORAL_TABLET | Freq: Four times a day (QID) | ORAL | 0 refills | Status: DC | PRN

## 2024-06-16 ENCOUNTER — Other Ambulatory Visit: Payer: Self-pay | Admitting: Internal Medicine

## 2024-06-16 ENCOUNTER — Ambulatory Visit
Admission: RE | Admit: 2024-06-16 | Discharge: 2024-06-16 | Disposition: A | Source: Ambulatory Visit | Attending: Radiation Oncology | Admitting: Radiation Oncology

## 2024-06-16 ENCOUNTER — Other Ambulatory Visit: Payer: Self-pay | Admitting: Gastroenterology

## 2024-06-16 DIAGNOSIS — Z510 Encounter for antineoplastic radiation therapy: Secondary | ICD-10-CM | POA: Diagnosis present

## 2024-06-16 DIAGNOSIS — C44219 Basal cell carcinoma of skin of left ear and external auricular canal: Secondary | ICD-10-CM | POA: Insufficient documentation

## 2024-06-16 NOTE — Telephone Encounter (Signed)
 Copied from CRM #[id number]. Topic: Clinical - Prescription Issue >> Jun 16, 2024  1:27 PM Troy Marsh wrote: Reason for CRM: Patient stated his medication alprostadil  (CAVERJECT  IMPULSE) 20 MCG injection was sent to [facility] on [address] but it shouldve been sent to [facility], [place] - [address] [phone] [address] [place] [address]   Which I did remove the other pharmacy's and he stated [facility] billed his insurance as well, even though he didn't pick it up. Can we get that sent to the [facility] instead. Callback number [phone]

## 2024-06-17 MED ORDER — CAVERJECT IMPULSE 20 MCG IC KIT: 20.0000 ug | PACK | INTRACAVERNOUS | 3 refills | Status: AC | PRN

## 2024-06-18 DIAGNOSIS — Z510 Encounter for antineoplastic radiation therapy: Secondary | ICD-10-CM | POA: Diagnosis not present

## 2024-06-23 ENCOUNTER — Ambulatory Visit: Admission: RE | Admit: 2024-06-23 | Discharge: 2024-06-23 | Attending: Radiation Oncology

## 2024-06-23 ENCOUNTER — Ambulatory Visit: Admitting: Radiation Oncology

## 2024-06-23 ENCOUNTER — Ambulatory Visit
Admission: RE | Admit: 2024-06-23 | Discharge: 2024-06-23 | Disposition: A | Source: Ambulatory Visit | Attending: Radiation Oncology | Admitting: Radiation Oncology

## 2024-06-23 ENCOUNTER — Other Ambulatory Visit: Payer: Self-pay

## 2024-06-23 DIAGNOSIS — Z510 Encounter for antineoplastic radiation therapy: Secondary | ICD-10-CM | POA: Diagnosis not present

## 2024-06-23 DIAGNOSIS — C44219 Basal cell carcinoma of skin of left ear and external auricular canal: Secondary | ICD-10-CM

## 2024-06-23 LAB — RAD ONC ARIA SESSION SUMMARY
Course Elapsed Days: 0
Plan Fractions Treated to Date: 1
Plan Prescribed Dose Per Fraction: 2.5 Gy
Plan Total Fractions Prescribed: 20
Plan Total Prescribed Dose: 50 Gy
Reference Point Dosage Given to Date: 2.5 Gy
Reference Point Session Dosage Given: 2.5 Gy
Session Number: 1

## 2024-06-23 MED ORDER — SONAFINE EX EMUL
1.0000 | Freq: Two times a day (BID) | CUTANEOUS | Status: DC
  Administered 2024-06-23: 1 via TOPICAL

## 2024-06-24 ENCOUNTER — Ambulatory Visit
Admission: RE | Admit: 2024-06-24 | Discharge: 2024-06-24 | Disposition: A | Source: Ambulatory Visit | Attending: Radiation Oncology

## 2024-06-24 ENCOUNTER — Other Ambulatory Visit: Payer: Self-pay

## 2024-06-24 DIAGNOSIS — Z510 Encounter for antineoplastic radiation therapy: Secondary | ICD-10-CM | POA: Diagnosis not present

## 2024-06-24 LAB — RAD ONC ARIA SESSION SUMMARY
Course Elapsed Days: 1
Plan Fractions Treated to Date: 2
Plan Prescribed Dose Per Fraction: 2.5 Gy
Plan Total Fractions Prescribed: 20
Plan Total Prescribed Dose: 50 Gy
Reference Point Dosage Given to Date: 5 Gy
Reference Point Session Dosage Given: 2.5 Gy
Session Number: 2

## 2024-06-25 ENCOUNTER — Other Ambulatory Visit: Payer: Self-pay

## 2024-06-25 ENCOUNTER — Ambulatory Visit: Admission: RE | Admit: 2024-06-25 | Discharge: 2024-06-25 | Attending: Radiation Oncology

## 2024-06-25 DIAGNOSIS — Z510 Encounter for antineoplastic radiation therapy: Secondary | ICD-10-CM | POA: Diagnosis not present

## 2024-06-25 LAB — RAD ONC ARIA SESSION SUMMARY
Course Elapsed Days: 2
Plan Fractions Treated to Date: 3
Plan Prescribed Dose Per Fraction: 2.5 Gy
Plan Total Fractions Prescribed: 20
Plan Total Prescribed Dose: 50 Gy
Reference Point Dosage Given to Date: 7.5 Gy
Reference Point Session Dosage Given: 2.5 Gy
Session Number: 3

## 2024-06-26 ENCOUNTER — Ambulatory Visit
Admission: RE | Admit: 2024-06-26 | Discharge: 2024-06-26 | Disposition: A | Discharge status: Home / Self Care | Source: Ambulatory Visit | Attending: Radiation Oncology | Admitting: Radiation Oncology

## 2024-06-26 ENCOUNTER — Other Ambulatory Visit: Payer: Self-pay

## 2024-06-26 DIAGNOSIS — Z510 Encounter for antineoplastic radiation therapy: Secondary | ICD-10-CM | POA: Diagnosis not present

## 2024-06-26 LAB — RAD ONC ARIA SESSION SUMMARY
Course Elapsed Days: 3
Plan Fractions Treated to Date: 4
Plan Prescribed Dose Per Fraction: 2.5 Gy
Plan Total Fractions Prescribed: 20
Plan Total Prescribed Dose: 50 Gy
Reference Point Dosage Given to Date: 10 Gy
Reference Point Session Dosage Given: 2.5 Gy
Session Number: 4

## 2024-06-27 ENCOUNTER — Ambulatory Visit
Admission: RE | Admit: 2024-06-27 | Discharge: 2024-06-27 | Disposition: A | Source: Ambulatory Visit | Attending: Radiation Oncology

## 2024-06-27 ENCOUNTER — Other Ambulatory Visit: Payer: Self-pay

## 2024-06-27 DIAGNOSIS — Z510 Encounter for antineoplastic radiation therapy: Secondary | ICD-10-CM | POA: Diagnosis not present

## 2024-06-27 LAB — RAD ONC ARIA SESSION SUMMARY
Course Elapsed Days: 4
Plan Fractions Treated to Date: 5
Plan Prescribed Dose Per Fraction: 2.5 Gy
Plan Total Fractions Prescribed: 20
Plan Total Prescribed Dose: 50 Gy
Reference Point Dosage Given to Date: 12.5 Gy
Reference Point Session Dosage Given: 2.5 Gy
Session Number: 5

## 2024-06-30 ENCOUNTER — Ambulatory Visit: Admission: RE | Admit: 2024-06-30 | Discharge: 2024-06-30 | Attending: Radiation Oncology

## 2024-06-30 ENCOUNTER — Other Ambulatory Visit: Payer: Self-pay

## 2024-06-30 DIAGNOSIS — Z510 Encounter for antineoplastic radiation therapy: Secondary | ICD-10-CM | POA: Diagnosis not present

## 2024-06-30 LAB — RAD ONC ARIA SESSION SUMMARY
Course Elapsed Days: 7
Plan Fractions Treated to Date: 6
Plan Prescribed Dose Per Fraction: 2.5 Gy
Plan Total Fractions Prescribed: 20
Plan Total Prescribed Dose: 50 Gy
Reference Point Dosage Given to Date: 15 Gy
Reference Point Session Dosage Given: 2.5 Gy
Session Number: 6

## 2024-07-01 ENCOUNTER — Ambulatory Visit
Admission: RE | Admit: 2024-07-01 | Discharge: 2024-07-01 | Disposition: A | Source: Ambulatory Visit | Attending: Radiation Oncology

## 2024-07-01 ENCOUNTER — Other Ambulatory Visit: Payer: Self-pay

## 2024-07-01 DIAGNOSIS — Z510 Encounter for antineoplastic radiation therapy: Secondary | ICD-10-CM | POA: Diagnosis not present

## 2024-07-01 LAB — RAD ONC ARIA SESSION SUMMARY
Course Elapsed Days: 8
Plan Fractions Treated to Date: 7
Plan Prescribed Dose Per Fraction: 2.5 Gy
Plan Total Fractions Prescribed: 20
Plan Total Prescribed Dose: 50 Gy
Reference Point Dosage Given to Date: 17.5 Gy
Reference Point Session Dosage Given: 2.5 Gy
Session Number: 7

## 2024-07-02 ENCOUNTER — Other Ambulatory Visit: Payer: Self-pay

## 2024-07-02 ENCOUNTER — Ambulatory Visit: Admission: RE | Admit: 2024-07-02 | Discharge: 2024-07-02 | Attending: Radiation Oncology

## 2024-07-02 DIAGNOSIS — Z510 Encounter for antineoplastic radiation therapy: Secondary | ICD-10-CM | POA: Diagnosis not present

## 2024-07-02 LAB — RAD ONC ARIA SESSION SUMMARY
Course Elapsed Days: 9
Plan Fractions Treated to Date: 8
Plan Prescribed Dose Per Fraction: 2.5 Gy
Plan Total Fractions Prescribed: 20
Plan Total Prescribed Dose: 50 Gy
Reference Point Dosage Given to Date: 20 Gy
Reference Point Session Dosage Given: 2.5 Gy
Session Number: 8

## 2024-07-03 ENCOUNTER — Other Ambulatory Visit: Payer: Self-pay

## 2024-07-03 ENCOUNTER — Ambulatory Visit

## 2024-07-03 DIAGNOSIS — Z510 Encounter for antineoplastic radiation therapy: Secondary | ICD-10-CM | POA: Diagnosis not present

## 2024-07-03 LAB — RAD ONC ARIA SESSION SUMMARY
Course Elapsed Days: 10
Plan Fractions Treated to Date: 9
Plan Prescribed Dose Per Fraction: 2.5 Gy
Plan Total Fractions Prescribed: 20
Plan Total Prescribed Dose: 50 Gy
Reference Point Dosage Given to Date: 22.5 Gy
Reference Point Session Dosage Given: 2.5 Gy
Session Number: 9

## 2024-07-04 ENCOUNTER — Other Ambulatory Visit: Payer: Self-pay

## 2024-07-04 ENCOUNTER — Other Ambulatory Visit: Payer: Self-pay | Admitting: Internal Medicine

## 2024-07-04 ENCOUNTER — Ambulatory Visit

## 2024-07-04 DIAGNOSIS — Z510 Encounter for antineoplastic radiation therapy: Secondary | ICD-10-CM | POA: Diagnosis not present

## 2024-07-04 LAB — RAD ONC ARIA SESSION SUMMARY
Course Elapsed Days: 11
Plan Fractions Treated to Date: 10
Plan Prescribed Dose Per Fraction: 2.5 Gy
Plan Total Fractions Prescribed: 20
Plan Total Prescribed Dose: 50 Gy
Reference Point Dosage Given to Date: 25 Gy
Reference Point Session Dosage Given: 2.5 Gy
Session Number: 10

## 2024-07-04 NOTE — Telephone Encounter (Unsigned)
 Copied from CRM #[id number]. Topic: Clinical - Medication Refill >> Jul 04, 2024  4:26 PM Drema MATSU wrote: Medication: testosterone  enanthate (DELATESTRYL ) 200 MG/ML injection Needs new prescription by 07/16/24  Has the patient contacted their pharmacy? Yes (Agent: If no, request that the patient contact the pharmacy for the refill. If patient does not wish to contact the pharmacy document the reason why and proceed with request.) No more refills  (Agent: If yes, when and what did the pharmacy advise?)  This is the patient's preferred pharmacy:  [facility] [phone] Fax [phone]  Is this the correct pharmacy for this prescription? Yes If no, delete pharmacy and type the correct one.   Has the prescription been filled recently? Yes  Is the patient out of the medication? N/a  Has the patient been seen for an appointment in the last year OR does the patient have an upcoming appointment? Yes  Can we respond through MyChart? Yes  Agent: Please be advised that Rx refills may take up to 3 business days. We ask that you follow-up with your pharmacy.

## 2024-07-06 ENCOUNTER — Other Ambulatory Visit: Payer: Self-pay

## 2024-07-06 ENCOUNTER — Ambulatory Visit: Admission: RE | Admit: 2024-07-06 | Discharge: 2024-07-06 | Attending: Radiation Oncology

## 2024-07-06 DIAGNOSIS — Z510 Encounter for antineoplastic radiation therapy: Secondary | ICD-10-CM | POA: Diagnosis not present

## 2024-07-06 LAB — RAD ONC ARIA SESSION SUMMARY
Course Elapsed Days: 13
Plan Fractions Treated to Date: 11
Plan Prescribed Dose Per Fraction: 2.5 Gy
Plan Total Fractions Prescribed: 20
Plan Total Prescribed Dose: 50 Gy
Reference Point Dosage Given to Date: 27.5 Gy
Reference Point Session Dosage Given: 2.5 Gy
Session Number: 11

## 2024-07-07 ENCOUNTER — Ambulatory Visit

## 2024-07-07 ENCOUNTER — Ambulatory Visit: Admission: RE | Admit: 2024-07-07 | Discharge: 2024-07-07 | Attending: Radiation Oncology

## 2024-07-07 ENCOUNTER — Other Ambulatory Visit: Payer: Self-pay

## 2024-07-07 DIAGNOSIS — Z510 Encounter for antineoplastic radiation therapy: Secondary | ICD-10-CM | POA: Diagnosis not present

## 2024-07-07 LAB — RAD ONC ARIA SESSION SUMMARY
Course Elapsed Days: 14
Plan Fractions Treated to Date: 12
Plan Prescribed Dose Per Fraction: 2.5 Gy
Plan Total Fractions Prescribed: 20
Plan Total Prescribed Dose: 50 Gy
Reference Point Dosage Given to Date: 30 Gy
Reference Point Session Dosage Given: 2.5 Gy
Session Number: 12

## 2024-07-07 MED ORDER — TESTOSTERONE ENANTHATE 200 MG/ML IM SOLN: 200.0000 mg | INTRAMUSCULAR | 1 refills | Status: DC

## 2024-07-08 ENCOUNTER — Ambulatory Visit
Admission: RE | Admit: 2024-07-08 | Discharge: 2024-07-08 | Disposition: A | Discharge status: Home / Self Care | Source: Ambulatory Visit | Attending: Radiation Oncology | Admitting: Radiation Oncology

## 2024-07-08 ENCOUNTER — Other Ambulatory Visit: Payer: Self-pay

## 2024-07-08 DIAGNOSIS — Z510 Encounter for antineoplastic radiation therapy: Secondary | ICD-10-CM | POA: Diagnosis not present

## 2024-07-08 LAB — RAD ONC ARIA SESSION SUMMARY
Course Elapsed Days: 15
Plan Fractions Treated to Date: 13
Plan Prescribed Dose Per Fraction: 2.5 Gy
Plan Total Fractions Prescribed: 20
Plan Total Prescribed Dose: 50 Gy
Reference Point Dosage Given to Date: 32.5 Gy
Reference Point Session Dosage Given: 2.5 Gy
Session Number: 13

## 2024-07-09 ENCOUNTER — Ambulatory Visit
Admission: RE | Admit: 2024-07-09 | Discharge: 2024-07-09 | Disposition: A | Source: Ambulatory Visit | Attending: Radiation Oncology

## 2024-07-09 ENCOUNTER — Other Ambulatory Visit: Payer: Self-pay

## 2024-07-09 ENCOUNTER — Other Ambulatory Visit: Payer: Self-pay | Admitting: Internal Medicine

## 2024-07-09 ENCOUNTER — Encounter: Payer: Self-pay | Admitting: Internal Medicine

## 2024-07-09 DIAGNOSIS — Z510 Encounter for antineoplastic radiation therapy: Secondary | ICD-10-CM | POA: Diagnosis not present

## 2024-07-09 LAB — RAD ONC ARIA SESSION SUMMARY
Course Elapsed Days: 16
Plan Fractions Treated to Date: 14
Plan Prescribed Dose Per Fraction: 2.5 Gy
Plan Total Fractions Prescribed: 20
Plan Total Prescribed Dose: 50 Gy
Reference Point Dosage Given to Date: 35 Gy
Reference Point Session Dosage Given: 2.5 Gy
Session Number: 14

## 2024-07-09 MED ORDER — TESTOSTERONE ENANTHATE 200 MG/ML IM SOLN: 200.0000 mg | INTRAMUSCULAR | 1 refills | Status: AC

## 2024-07-09 MED ORDER — HYDROCODONE-ACETAMINOPHEN 7.5-325 MG PO TABS: 1.0000 | ORAL_TABLET | Freq: Four times a day (QID) | ORAL | 0 refills | Status: DC | PRN

## 2024-07-14 ENCOUNTER — Ambulatory Visit

## 2024-07-14 ENCOUNTER — Ambulatory Visit
Admission: RE | Admit: 2024-07-14 | Discharge: 2024-07-14 | Disposition: A | Discharge status: Home / Self Care | Source: Ambulatory Visit | Attending: Radiation Oncology | Admitting: Radiation Oncology

## 2024-07-14 ENCOUNTER — Other Ambulatory Visit: Payer: Self-pay

## 2024-07-14 DIAGNOSIS — Z510 Encounter for antineoplastic radiation therapy: Secondary | ICD-10-CM | POA: Diagnosis not present

## 2024-07-14 LAB — RAD ONC ARIA SESSION SUMMARY
Course Elapsed Days: 21
Plan Fractions Treated to Date: 15
Plan Prescribed Dose Per Fraction: 2.5 Gy
Plan Total Fractions Prescribed: 20
Plan Total Prescribed Dose: 50 Gy
Reference Point Dosage Given to Date: 37.5 Gy
Reference Point Session Dosage Given: 2.5 Gy
Session Number: 15

## 2024-07-15 ENCOUNTER — Other Ambulatory Visit: Payer: Self-pay

## 2024-07-15 ENCOUNTER — Ambulatory Visit
Admission: RE | Admit: 2024-07-15 | Discharge: 2024-07-15 | Disposition: A | Discharge status: Home / Self Care | Source: Ambulatory Visit | Attending: Radiation Oncology | Admitting: Radiation Oncology

## 2024-07-15 DIAGNOSIS — Z510 Encounter for antineoplastic radiation therapy: Secondary | ICD-10-CM | POA: Diagnosis not present

## 2024-07-15 LAB — RAD ONC ARIA SESSION SUMMARY
Course Elapsed Days: 22
Plan Fractions Treated to Date: 16
Plan Prescribed Dose Per Fraction: 2.5 Gy
Plan Total Fractions Prescribed: 20
Plan Total Prescribed Dose: 50 Gy
Reference Point Dosage Given to Date: 40 Gy
Reference Point Session Dosage Given: 2.5 Gy
Session Number: 16

## 2024-07-16 ENCOUNTER — Ambulatory Visit
Admission: RE | Admit: 2024-07-16 | Discharge: 2024-07-16 | Disposition: A | Discharge status: Home / Self Care | Source: Ambulatory Visit | Attending: Radiation Oncology | Admitting: Radiation Oncology

## 2024-07-16 ENCOUNTER — Other Ambulatory Visit: Payer: Self-pay

## 2024-07-16 DIAGNOSIS — Z510 Encounter for antineoplastic radiation therapy: Secondary | ICD-10-CM | POA: Diagnosis not present

## 2024-07-16 LAB — RAD ONC ARIA SESSION SUMMARY
Course Elapsed Days: 23
Plan Fractions Treated to Date: 17
Plan Prescribed Dose Per Fraction: 2.5 Gy
Plan Total Fractions Prescribed: 20
Plan Total Prescribed Dose: 50 Gy
Reference Point Dosage Given to Date: 42.5 Gy
Reference Point Session Dosage Given: 2.5 Gy
Session Number: 17

## 2024-07-18 ENCOUNTER — Other Ambulatory Visit: Payer: Self-pay

## 2024-07-18 ENCOUNTER — Ambulatory Visit
Admission: RE | Admit: 2024-07-18 | Discharge: 2024-07-18 | Disposition: A | Discharge status: Home / Self Care | Source: Ambulatory Visit | Attending: Radiation Oncology | Admitting: Radiation Oncology

## 2024-07-18 DIAGNOSIS — Z510 Encounter for antineoplastic radiation therapy: Secondary | ICD-10-CM | POA: Diagnosis not present

## 2024-07-18 DIAGNOSIS — C44219 Basal cell carcinoma of skin of left ear and external auricular canal: Secondary | ICD-10-CM | POA: Insufficient documentation

## 2024-07-18 LAB — RAD ONC ARIA SESSION SUMMARY
Course Elapsed Days: 25
Plan Fractions Treated to Date: 18
Plan Prescribed Dose Per Fraction: 2.5 Gy
Plan Total Fractions Prescribed: 20
Plan Total Prescribed Dose: 50 Gy
Reference Point Dosage Given to Date: 45 Gy
Reference Point Session Dosage Given: 2.5 Gy
Session Number: 18

## 2024-07-21 ENCOUNTER — Ambulatory Visit
Admission: RE | Admit: 2024-07-21 | Discharge: 2024-07-21 | Disposition: A | Discharge status: Home / Self Care | Source: Ambulatory Visit | Attending: Radiation Oncology | Admitting: Radiation Oncology

## 2024-07-21 ENCOUNTER — Ambulatory Visit: Admitting: Gastroenterology

## 2024-07-21 ENCOUNTER — Other Ambulatory Visit: Payer: Self-pay

## 2024-07-21 DIAGNOSIS — C44219 Basal cell carcinoma of skin of left ear and external auricular canal: Secondary | ICD-10-CM | POA: Diagnosis not present

## 2024-07-21 LAB — RAD ONC ARIA SESSION SUMMARY
Course Elapsed Days: 28
Plan Fractions Treated to Date: 19
Plan Prescribed Dose Per Fraction: 2.5 Gy
Plan Total Fractions Prescribed: 20
Plan Total Prescribed Dose: 50 Gy
Reference Point Dosage Given to Date: 47.5 Gy
Reference Point Session Dosage Given: 2.5 Gy
Session Number: 19

## 2024-07-22 ENCOUNTER — Ambulatory Visit

## 2024-07-22 ENCOUNTER — Other Ambulatory Visit: Payer: Self-pay

## 2024-07-22 ENCOUNTER — Ambulatory Visit
Admission: RE | Admit: 2024-07-22 | Discharge: 2024-07-22 | Disposition: A | Source: Ambulatory Visit | Attending: Radiation Oncology

## 2024-07-22 DIAGNOSIS — C44219 Basal cell carcinoma of skin of left ear and external auricular canal: Secondary | ICD-10-CM | POA: Diagnosis not present

## 2024-07-22 LAB — RAD ONC ARIA SESSION SUMMARY
Course Elapsed Days: 29
Plan Fractions Treated to Date: 20
Plan Prescribed Dose Per Fraction: 2.5 Gy
Plan Total Fractions Prescribed: 20
Plan Total Prescribed Dose: 50 Gy
Reference Point Dosage Given to Date: 50 Gy
Reference Point Session Dosage Given: 2.5 Gy
Session Number: 20

## 2024-07-23 ENCOUNTER — Ambulatory Visit

## 2024-07-23 NOTE — Radiation Completion Notes (Signed)
 Patient Name: Troy Marsh, Troy Marsh MRN: [id number] Date of Birth: [birth year] Referring Physician: RYAN GAMMON, M.D. Date of Service: 2024-07-23 Radiation Oncologist: Lauraine Golden, M.D. 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 Staging on 2024-05-30: Basal cell carcinoma of skin of left ear T=cT1, N=cN0, M=cM0 Intent: Curative     ==========DELIVERED PLANS==========  First Treatment Date: 2024-06-23 Last Treatment Date: 2024-07-22   Plan Name: [id number] Site: [place], Left Technique: Electron Mode: Electron Dose Per Fraction: 2.5 Gy Prescribed Dose (Delivered / Prescribed): 50 Gy / 50 Gy Prescribed Fxs (Delivered / Prescribed): 20 / 20     ==========ON TREATMENT VISIT DATES========== 2024-06-23, 2024-06-30, 2024-07-07, 2024-07-21     ==========UPCOMING VISITS========== 09/08/2024 [facility] ONC FOLLOW UP 20 Golden Lauraine, MD  09/03/2024 [facility] OFFICE VISIT Garald Karlynn GAILS, MD  08/06/2024 [facility] OFFICE FOLLOW UP [address] [place] Alan [place], [place]  07/28/2024 [facility] ANNUAL WELL VISIT, [facility]-ANNUAL WELLNESS VISIT        ==========APPENDIX - ON TREATMENT VISIT NOTES==========   See weekly On Treatment Notes in Epic for details in the Media tab (listed as Progress notes on the On Treatment Visit Dates listed above).

## 2024-07-24 ENCOUNTER — Ambulatory Visit

## 2024-07-25 ENCOUNTER — Ambulatory Visit: Admitting: Gastroenterology

## 2024-07-28 ENCOUNTER — Ambulatory Visit: Payer: Medicare Other

## 2024-07-28 VITALS — Ht 70.0 in | Wt 136.0 lb

## 2024-07-28 DIAGNOSIS — Z0000 Encounter for general adult medical examination without abnormal findings: Secondary | ICD-10-CM | POA: Diagnosis not present

## 2024-07-28 NOTE — Patient Instructions (Addendum)
 Troy Marsh,  Thank you for taking the time for your Medicare Wellness Visit. I appreciate your continued commitment to your health goals. Please review the care plan we discussed, and feel free to reach out if I can assist you further.  Please note that Annual Wellness Visits do not include a physical exam. Some assessments may be limited, especially if the visit was conducted virtually. If needed, we may recommend an in-person follow-up with your provider.  Ongoing Care Seeing your primary care provider every 3 to 6 months helps us  monitor your health and provide consistent, personalized care. Next office visit on 09/03/2024.  You are due for a Hep C screening and can get that done during your next lab visit.  Keep up the good work.  Referrals If a referral was made during today's visit and you haven't received any updates within two weeks, please contact the referred provider directly to check on the status.  Recommended Screenings:  Health Maintenance  Topic Date Due   Hepatitis C Screening  Never done   Medicare Annual Wellness Visit  07/26/2024   COVID-19 Vaccine (11 - Pfizer risk 2025-26 season) 09/23/2024   Colon Cancer Screening  08/16/2031   DTaP/Tdap/Td vaccine (8 - Td or Tdap) 10/20/2033   Pneumococcal Vaccine for age over 85  Completed   Flu Shot  Completed   Meningitis B Vaccine  Aged Out   Zoster (Shingles) Vaccine  Discontinued       07/22/2024    9:07 AM  Advanced Directives  Does Patient Have a Medical Advance Directive? Yes  Type of Estate Agent of [place];Living will  Copy of Healthcare Power of Attorney in Chart? No - copy requested    Vision: Annual vision screenings are recommended for early detection of glaucoma, cataracts, and diabetic retinopathy. These exams can also reveal signs of chronic conditions such as diabetes and high blood pressure.  Dental: Annual dental screenings help detect early signs of oral cancer, gum disease, and  other conditions linked to overall health, including heart disease and diabetes.  Please see the attached documents for additional preventive care recommendations.

## 2024-07-28 NOTE — Progress Notes (Cosign Needed Addendum)
 "  Chief Complaint  Patient presents with   Medicare Wellness     Subjective:   Troy Marsh is a 74 y.o. male who presents for a Medicare Annual Wellness Visit.  Visit info / Clinical Intake: Medicare Wellness Visit Type:: Subsequent Annual Wellness Visit Persons participating in visit and providing information:: patient Medicare Wellness Visit Mode:: Video Since this visit was completed virtually, some vitals may be partially provided or unavailable. Missing vitals are due to the limitations of the virtual format.: Unable to obtain vitals - no equipment If Telephone or Video please confirm:: I connected with patient using audio/video enable telemedicine. I verified patient identity with two identifiers, discussed telehealth limitations, and patient agreed to proceed. Patient Location:: Home Provider Location:: Office Interpreter Needed?: No Pre-visit prep was completed: yes AWV questionnaire completed by patient prior to visit?: yes Date:: 08/01/24 Living arrangements:: (Patient-Rptd) lives with spouse/significant other Patient's Overall Health Status Rating: (Patient-Rptd) very good Typical amount of pain: (Patient-Rptd) some Does pain affect daily life?: (Patient-Rptd) no Are you currently prescribed opioids?: no  Dietary Habits and Nutritional Risks How many meals a day?: (Patient-Rptd) 2 Eats fruit and vegetables daily?: (Patient-Rptd) yes Most meals are obtained by: (Patient-Rptd) eating out In the last 2 weeks, have you had any of the following?: none Diabetic:: no  Functional Status Activities of Daily Living (to include ambulation/medication): (Patient-Rptd) Independent Ambulation: (Patient-Rptd) Independent Medication Administration: (Patient-Rptd) Independent Home Management (perform basic housework or laundry): (Patient-Rptd) Independent Manage your own finances?: (Patient-Rptd) yes Primary transportation is: (Patient-Rptd) driving Concerns about vision?: no  *vision screening is required for WTM* Concerns about hearing?: no  Fall Screening Falls in the past year?: (Patient-Rptd) 0 Number of falls in past year: 0 Was there an injury with Fall?: 0 Fall Risk Category Calculator: 0 Patient Fall Risk Level: Low Fall Risk  Fall Risk Patient at Risk for Falls Due to: No Fall Risks Fall risk Follow up: Falls evaluation completed; Falls prevention discussed  Home and Transportation Safety: All rugs have non-skid backing?: (Patient-Rptd) N/A, no rugs All stairs or steps have railings?: (Patient-Rptd) yes Grab bars in the bathtub or shower?: (Patient-Rptd) yes Have non-skid surface in bathtub or shower?: (Patient-Rptd) yes Good home lighting?: (Patient-Rptd) yes Regular seat belt use?: (Patient-Rptd) yes Hospital stays in the last year:: (!) (Patient-Rptd) yes How many hospital stays:: (Patient-Rptd) 1  Cognitive Assessment Difficulty concentrating, remembering, or making decisions? : (Patient-Rptd) no Will 6CIT or Mini Cog be Completed: no 6CIT or Mini Cog Declined: patient alert, oriented, able to answer questions appropriately and recall recent events  Advance Directives (For Healthcare) Does Patient Have a Medical Advance Directive?: Yes Does patient want to make changes to medical advance directive?: No - Patient declined Type of Advance Directive: Healthcare Power of [place]; Living will Copy of Healthcare Power of Attorney in Chart?: No - copy requested Copy of Living Will in Chart?: No - copy requested Would patient like information on creating a medical advance directive?: No - Patient declined  Reviewed/Updated  Reviewed/Updated: Reviewed All (Medical, Surgical, Family, Medications, Allergies, Care Teams, Patient Goals)    Allergies (verified) Patient has no known allergies.   Current Medications (verified) Outpatient Encounter Medications as of 07/28/2024  Medication Sig   alendronate (FOSAMAX) 70 MG tablet Take 70 mg by  mouth once a week.   alprostadil  (CAVERJECT  IMPULSE) 20 MCG injection 20 mcg by Intracavitary route as needed for erectile dysfunction. use no more than 3 times per weekINJECT 20 MCG BY INTRACAVITARY  ROUTE AS DIRECTED AS NEEDED FOR  ERECTILE DYSFUNCTION USE NO MORE THAN 3 TIMES WEEKLY   amoxicillin  (AMOXIL ) 500 MG capsule Take 2,000 mg by mouth See admin instructions. Dental procedures   aspirin EC 81 MG tablet Take 81 mg by mouth daily. Swallow whole.   atorvastatin  (LIPITOR) 40 MG tablet Take 40 mg by mouth at bedtime.    calcium  carbonate (TUMS EX) 750 MG chewable tablet Chew 750 mg by mouth daily as needed for indigestion or heartburn.   Carboxymethylcellulose Sodium (THERATEARS OP) Apply 1 drop to eye as needed (Dry eye).   cetirizine-pseudoephedrine (ZYRTEC-D) 5-120 MG tablet Take 1 tablet by mouth daily as needed for allergies.    cyclobenzaprine  (FLEXERIL ) 5 MG tablet Take 5-10 mg by mouth at bedtime as needed for muscle spasms.   diltiazem  2 % GEL Apply 1 Application topically at bedtime.   emtricitabine -rilpivir-tenofovir  AF (ODEFSEY ) 200-25-25 MG TABS tablet Take 1 tablet by mouth daily.   gabapentin  (NEURONTIN ) 300 MG capsule Take 300 mg by mouth 3 (three) times daily.   hydrochlorothiazide  (HYDRODIURIL ) 50 MG tablet TAKE 1 TABLET BY MOUTH DAILY   HYDROcodone -acetaminophen  (NORCO) 7.5-325 MG tablet Take 1 tablet by mouth every 6 (six) hours as needed for severe pain (pain score 7-10).   hydrocortisone  (ANUSOL -HC) 25 MG suppository UNWRAP AND INSERT 1 SUPPOSITORY  RECTALLY TWICE DAILY   LINZESS  290 MCG CAPS capsule TAKE 1 CAPSULE BY MOUTH DAILY  BEFORE BREAKFAST   Melatonin 10 MG TABS Take 10 mg by mouth at bedtime.   methocarbamol  (ROBAXIN ) 500 MG tablet Take 1 tablet (500 mg total) by mouth every 6 (six) hours as needed for muscle spasms.   montelukast  (SINGULAIR ) 10 MG tablet Take 1 tablet (10 mg total) by mouth daily.   Multiple Vitamin (MULTIVITAMIN PO) Take 1 tablet by mouth  daily.   polyethylene glycol powder (GLYCOLAX /MIRALAX ) powder Take 17 g by mouth daily as needed for severe constipation, moderate constipation or mild constipation.   potassium chloride  (KLOR-CON  M) 10 MEQ tablet Take 1 tablet (10 mEq total) by mouth daily.   ramelteon  (ROZEREM ) 8 MG tablet Take 1 tablet (8 mg total) by mouth at bedtime.   RESTASIS  0.05 % ophthalmic emulsion Place 1 drop into both eyes daily.   testosterone  enanthate (DELATESTRYL ) 200 MG/ML injection Inject 1 mL (200 mg total) into the muscle every 14 (fourteen) days. For IM use only   No facility-administered encounter medications on file as of 07/28/2024.    History: Past Medical History:  Diagnosis Date   Arthritis    BPH (benign prostatic hyperplasia)    [facility]  DR. YSIDRO   Cancer [facility]    Skin cancer- basal and squamous   Closed fracture of nasal bones 06/02/2022   Constipation due to pain medication    HIV disease (HCC) 1985   last CD4 10/02/12 was 277   Hyperlipidemia    Hypertension    Hypogonadism male    IBS (irritable bowel syndrome)    Past Surgical History:  Procedure Laterality Date   ANTERIOR CERVICAL DECOMP/DISCECTOMY FUSION Bilateral 11/22/2017   Procedure: ANTERIOR CERVICAL DECOMPRESSION FUSION CERVICAL 4-5, CERVICAL 5-6, CERVICAL 6-7 WITH INSTRUMENTATION AND ALLOGRAFT TIME      /   REQUESTED 4 HOURS;  Surgeon: Beuford Anes, MD;  Location: [facility] OR;  Service: Orthopedics;  Laterality: Bilateral;   ANTERIOR LAT LUMBAR FUSION Left 02/14/2023   Procedure: LEFT SIDED LUMBAR THREE - LUMBAR FOUR, LUMBAR FOUR - LUMBAR FIVE LATERAL INTERBODY FUSION WITH  INSTRUMENTATION AND ALLOGRAFT;  Surgeon: Beuford Anes, MD;  Location: [facility] OR;  Service: Orthopedics;  Laterality: Left;   ANTERIOR LAT LUMBAR FUSION Right 03/12/2024   Procedure: ANTERIOR LATERAL LUMBAR FUSION 1 LEVEL;  Surgeon: Beuford Anes, MD;  Location: [facility] OR;  Service: Orthopedics;  Laterality: Right;  RIGHT-SIDED LUMBAR 2 - LUMBAR 3 LATERAL  INTERBODY FUSION WITH INSTRUMENTATION AND ALLOGRAFT   BACK SURGERY     COLONOSCOPY W/ POLYPECTOMY     EYE SURGERY Bilateral    Lasik, Cataract   HEMORRHOID SURGERY  1973   INGUINAL HERNIA REPAIR Right 06/11/2018   Procedure: LAPAROSCOPIC RIGHT INGUINAL HERNIA WITH MESH;  Surgeon: Rubin Calamity, MD;  Location: [facility] OR;  Service: General;  Laterality: Right;   INSERTION OF MESH Right 06/11/2018   Procedure: INSERTION OF MESH;  Surgeon: Rubin Calamity, MD;  Location: [facility] OR;  Service: General;  Laterality: Right;   KNEE ARTHROSCOPY Right 2009   KNEE ARTHROSCOPY Left 2011   neck surgery  1984   POSTERIOR CERVICAL FUSION/FORAMINOTOMY N/A 05/29/2018   Procedure: POSTERIOR CERVICAL DECOMPRESSION FUSION CERVICAL 4 - CERVICAL 7 WITH INSTRUMENTATION AND ALLOGRAFT;  Surgeon: Beuford Anes, MD;  Location: [facility] OR;  Service: Orthopedics;  Laterality: N/A;   TOTAL KNEE ARTHROPLASTY Left 12/25/2023   Procedure: ARTHROPLASTY, KNEE, TOTAL;  Surgeon: Josefina Chew, MD;  Location: [facility] ORS;  Service: Orthopedics;  Laterality: Left;   Family History  Problem Relation Age of Onset   Coronary artery disease Father    Heart attack Father    Hypertension Father    Hyperlipidemia Father    Cancer Father        prostate   Colitis Maternal Grandmother    Diabetes Neg Hx    Colon cancer Neg Hx    Rectal cancer Neg Hx    Stomach cancer Neg Hx    Esophageal cancer Neg Hx    Social History   Occupational History   Occupation: retired    Associate Professor: [employer]    Comment: government work  Tobacco Use   Smoking status: Former    Current packs/day: 0.00    Average packs/day: 2.0 packs/day for 35.0 years (70.0 ttl pk-yrs)    Types: Cigarettes    Start date: 02/09/1969    Quit date: 02/10/2004    Years since quitting: 20.4   Smokeless tobacco: Never  Vaping Use   Vaping status: Never Used  Substance and Sexual Activity   Alcohol  use: No    Comment: quit 2004   Drug use: No   Sexual activity:  Yes    Partners: Male    Birth control/protection: Condom    Comment: monogamous w/ single male partner 56   Tobacco Counseling Counseling given: Not Answered  SDOH Screenings   Food Insecurity: No Food Insecurity (07/28/2024)  Housing: Low Risk (07/28/2024)  Transportation Needs: No Transportation Needs (07/28/2024)  Utilities: Not At Risk (07/28/2024)  Alcohol  Screen: Low Risk (07/27/2022)  Depression (PHQ2-9): Low Risk (07/28/2024)  Financial Resource Strain: Low Risk (06/02/2024)  Physical Activity: Sufficiently Active (07/28/2024)  Recent Concern: Physical Activity - Insufficiently Active (06/02/2024)  Social Connections: Moderately Integrated (07/28/2024)  Stress: No Stress Concern Present (07/28/2024)  Recent Concern: Stress - Stress Concern Present (06/02/2024)  Tobacco Use: Medium Risk (07/28/2024)  Health Literacy: Adequate Health Literacy (07/28/2024)   See flowsheets for full screening details  Depression Screen PHQ 2 & 9 Depression Scale- Over the past 2 weeks, how often have you been bothered by any of the following problems?  Little interest or pleasure in doing things: 0 Feeling down, depressed, or hopeless (PHQ Adolescent also includes...irritable): 0 PHQ-2 Total Score: 0 Trouble falling or staying asleep, or sleeping too much: 0 Feeling tired or having little energy: 0 Poor appetite or overeating (PHQ Adolescent also includes...weight loss): 0 Feeling bad about yourself - or that you are a failure or have let yourself or your family down: 0 Trouble concentrating on things, such as reading the newspaper or watching television (PHQ Adolescent also includes...like school work): 0 Moving or speaking so slowly that other people could have noticed. Or the opposite - being so fidgety or restless that you have been moving around a lot more than usual: 0 Thoughts that you would be better off dead, or of hurting yourself in some way: 0 PHQ-9 Total Score: 0 If you checked off any  problems, how difficult have these problems made it for you to do your work, take care of things at home, or get along with other people?: Not difficult at all  Depression Treatment Depression Interventions/Treatment : EYV7-0 Score <4 Follow-up Not Indicated     Goals Addressed             This Visit's Progress    My goal for 2024 is to continue to exercise and not aggrivate my back.   On track            Objective:    Today's Vitals   07/28/24 1002  Weight: 136 lb (61.7 kg)  Height: 5' 10 (1.778 m)   Body mass index is 19.51 kg/m.  Hearing/Vision screen Hearing Screening - Comments:: Denies hearing difficulties   Vision Screening - Comments:: Denies vision issues./UTD/Dr. Mincey/[facility]  Immunizations and Health Maintenance Health Maintenance  Topic Date Due   Hepatitis C Screening  Never done   COVID-19 Vaccine (11 - Pfizer risk 2025-26 season) 09/23/2024   Medicare Annual Wellness (AWV)  07/28/2025   Colonoscopy  08/16/2031   DTaP/Tdap/Td (8 - Td or Tdap) 10/20/2033   Pneumococcal Vaccine: 50+ Years  Completed   Influenza Vaccine  Completed   Meningococcal B Vaccine  Aged Out   Zoster Vaccines- Shingrix  Discontinued        Assessment/Plan:  This is a routine wellness examination for Troy Marsh.  Patient Care Team: Plotnikov, Karlynn GAILS, MD as PCP - General (Internal Medicine) Jinny Lenis, MD as Referring Physician (Infectious Diseases) Beuford Anes, MD as Consulting Physician (Orthopedic Surgery) Nieves Cough, MD as Consulting Physician (Urology) Charlanne Groom, MD as Consulting Physician (Gastroenterology) Regenia, Prentice Clack, MD as Consulting Physician (Ophthalmology) Margeret Kotyk, MD as Referring Physician (Physical Medicine and Rehabilitation)  I have personally reviewed and noted the following in the patients chart:   Medical and social history Use of alcohol , tobacco or illicit drugs  Current medications and supplements  including opioid prescriptions. Functional ability and status Nutritional status Physical activity Advanced directives List of other physicians Hospitalizations, surgeries, and ER visits in previous 12 months Vitals Screenings to include cognitive, depression, and falls Referrals and appointments  No orders of the defined types were placed in this encounter.  In addition, I have reviewed and discussed with patient certain preventive protocols, quality metrics, and best practice recommendations. A written personalized care plan for preventive services as well as general preventive health recommendations were provided to patient.   Hisako Bugh L Mkayla Steele, CMA   07/28/2024   Return in 1 year (on 07/28/2025).  After Visit Summary: (MyChart) Due to this being a telephonic visit,  the after visit summary with patients personalized plan was offered to patient via MyChart   Nurse Notes: Patient is due for a Hep C screening and he would like to be added to his other labs already placed, (Please advise).  He stated that he has an appointment coming for a colonoscopy consultation on 08/07/2023. Patient is up to date on all other health maintenance with no concerns to address today.    Medical screening examination/treatment/procedure(s) were performed by non-physician practitioner and as supervising physician I was immediately available for consultation/collaboration.  I agree with above. Karlynn Noel, MD  "

## 2024-08-05 NOTE — Progress Notes (Unsigned)
 "    08/06/2024 TOD Troy Marsh [id number] January 17, 1951  Referring provider: Garald Karlynn GAILS, MD Primary GI doctor: Dr. Charlanne  ASSESSMENT AND PLAN:  Opioid-induced constipation 2021 CTAP W unremarkable Linzess  290 mg daily, MiraLAX  daily once daily, Metamucil Still has to use 2 senokots daily, having BM every 2-3 days - Recommended increasing Miralax  to twice daily as needed. - Discussed and prescribed Amitiza  24 mcg BID for opioid-induced constipation, to be started after vacation. - Continue Linzess , Metamucil, and Senokot. - Provided anticipatory guidance regarding medication changes and bowel regimen during travel.  Anal fissure and internal hemorrhoids presents with irritation and intermittent rectal bleeding Concerning for AIN could be scar tissue from previous fissure Small, tender, submucosal nodule with white, nonscrapable areas concerning for possible dysplasia, requiring urgent specialist evaluation. - Referred urgently to rectal surgeon for evaluation and possible biopsy. - Documented findings with photograph in medical record. - normal colonoscopy 07/2021, will discuss with Dr. Charlanne if we need to consider repeat flex sig versus colonoscopy - Prescribed diltiazem /lidocaine  cream for symptomatic relief.  Pelvic floor dysfunction contributing to constipation Decreased rectal tone and difficulty evacuating soft stool indicate pelvic floor dysfunction as a significant factor in ongoing constipation. Physical therapy deferred per his preference until after vacation and trial of new medication. - Discussed pelvic floor physical therapy as a treatment option. - He elected to defer pelvic floor physical therapy until after vacation and after trial of Amitiza . - Will revisit pelvic floor physical therapy at follow-up after vacation.  [facility] 09/2018 colonoscopy fair prep nonbleeding internal hemorrhoids otherwise grossly normal repeat 3 years due to quality of prep 08/15/2021 6  mm hyperplastic polyp, minimal sigmoid diverticulosis nonbleeding internal hemorrhoids chronic posterior anal fissure normal TI No recall unless symptoms  HIV Follows with [facility] infectious disease On emtricitab-rilpivir-tenofo ala (ODEFSEY )   Patient Care Team: Plotnikov, Karlynn GAILS, MD as PCP - General (Internal Medicine) Jinny Lenis, MD as Referring Physician (Infectious Diseases) Beuford Anes, MD as Consulting Physician (Orthopedic Surgery) Nieves Cough, MD as Consulting Physician (Urology) Charlanne Groom, MD as Consulting Physician (Gastroenterology) Regenia, Prentice Clack, MD as Consulting Physician (Ophthalmology) Margeret Kotyk, MD as Referring Physician (Physical Medicine and Rehabilitation)  HISTORY OF PRESENT ILLNESS: 74 y.o. male with a past medical history HIV, hypertension, OA status post lumbar fusion 2024, neck ACDF and right inguinal hernia repair on chronic pain medications and others listed below presents for evaluation of rectal burning and intermittent bleeding.   Last seen in the office 07/27/2023 by Dr. Charlanne for anal fissure internal hemorrhoids and constipation.  Discussed the use of AI scribe software for clinical note transcription with the patient, who gave verbal consent to proceed.  History of Present Illness   Troy Marsh is a 74 year old male with chronic constipation and prior anal fissure who presents for evaluation of burning rectal symptoms and ongoing constipation.  Constipation has been longstanding, with prior evaluation in January 2025. Symptoms worsen around the holidays, with intermittent burning sensations in the rectal area, most pronounced during defecation and associated with fissure-like discomfort.  Current bowel regimen includes Linzess  290 mcg, Miralax  once daily, Metamucil, and Senokot twice daily. Despite this, bowel movements occur as infrequently as every three days, leading to bloating and a funky feeling. Senokot provides  inconsistent relief. Stool consistency is described as soft, almost diarrhea, rather than hard. Frequent gurgling, gas, and a sensation of fullness similar to overeating are reported. No significant abdominal pain, fever, chills, nausea, vomiting, or unintentional weight  loss.  Dietary challenges include frequent consumption of processed foods and difficulty preparing high-fiber meals, though he attempts to maintain adequate hydration. Hydrocodone  is used in half-tablet increments as needed. No history of pelvic or rectal surgeries or significant antibiotic use. History of hemorrhoids, now resolved. Physically active with regular gym attendance, walking, and weightlifting.  Family history is notable for bowel issues on his mother's side, with maternal grandmother and two maternal aunts deceased from intestinal cancers, and a brother with bowel and prostate issues. Last colonoscopy was in 2023.      He  reports that he quit smoking about 20 years ago. His smoking use included cigarettes. He started smoking about 55 years ago. He has a 70 pack-year smoking history. He has never used smokeless tobacco. He reports that he does not drink alcohol  and does not use drugs.  RELEVANT GI HISTORY, IMAGING AND LABS: Results         Colon 08/15/2021 - One 6 mm polyp in the mid descending colon, removed with a cold snare. Resected and retrieved. Bx- hyperplastiic. No need to rpt d/t age, unless problems. - Minimal sigmoid diverticulosis - Non-bleeding internal hemorrhoids. - Chronic posterior anal fissure - The examined portion of the ileum was normal. - The examination was otherwise normal on direct and retroflexion views.   Colonoscopy 09/2018 fair prep. - Non-bleeding internal hemorrhoids. - Otherwise grossly normal colonoscopy. - Repeat in 3 years d/t HIV, quality of prep   CT AP 06/2020 No acute abnormality.  No finding to explain rectal bleeding. Prostatomegaly. Aortic Atherosclerosis  (ICD10-I70.0). CBC    Component Value Date/Time   WBC 4.6 05/28/2024 1117   RBC 4.35 05/28/2024 1117   HGB 13.7 05/28/2024 1117   HGB 16.6 11/30/2020 0958   HCT 40.1 05/28/2024 1117   PLT 178.0 05/28/2024 1117   PLT 141 (L) 11/30/2020 0958   MCV 92.2 05/28/2024 1117   MCH 32.9 03/10/2024 0900   MCHC 34.2 05/28/2024 1117   RDW 14.5 05/28/2024 1117   LYMPHSABS 1.2 05/28/2024 1117   MONOABS 0.4 05/28/2024 1117   EOSABS 0.0 05/28/2024 1117   BASOSABS 0.0 05/28/2024 1117   Recent Labs    12/13/23 1145 03/10/24 0900 05/28/24 1117  HGB 17.3* 14.8 13.7    CMP     Component Value Date/Time   NA 135 05/28/2024 1117   NA 141 09/30/2012 0000   K 4.8 05/28/2024 1117   CL 96 05/28/2024 1117   CO2 33 (H) 05/28/2024 1117   GLUCOSE 89 05/28/2024 1117   GLUCOSE 96 05/21/2006 0749   BUN 14 05/28/2024 1117   BUN 15 09/30/2012 0000   CREATININE 0.95 05/28/2024 1117   CALCIUM  9.0 05/28/2024 1117   PROT 5.7 (L) 05/28/2024 1117   ALBUMIN  3.9 05/28/2024 1117   AST 16 05/28/2024 1117   ALT 11 05/28/2024 1117   ALKPHOS 58 05/28/2024 1117   BILITOT 0.6 05/28/2024 1117   GFRNONAA >60 03/10/2024 0900   GFRAA >60 06/03/2018 1139      Latest Ref Rng & Units 05/28/2024   11:17 AM 05/15/2023    8:51 AM 11/20/2022    8:18 AM  Hepatic Function  Total Protein 6.0 - 8.3 g/dL 5.7  7.2  6.0   Albumin  3.5 - 5.2 g/dL 3.9  4.6  4.0   AST 0 - 37 U/L 16  20  24    ALT 0 - 53 U/L 11  13  14    Alk Phosphatase 39 - 117 U/L 58  109  49   Total Bilirubin 0.2 - 1.2 mg/dL 0.6  0.7  0.9       Current Medications:   Current Outpatient Medications (Endocrine & Metabolic):    alendronate (FOSAMAX) 70 MG tablet, Take 70 mg by mouth once a week.   testosterone  enanthate (DELATESTRYL ) 200 MG/ML injection, Inject 1 mL (200 mg total) into the muscle every 14 (fourteen) days. For IM use only  Current Outpatient Medications (Cardiovascular):    alprostadil  (CAVERJECT  IMPULSE) 20 MCG injection, 20 mcg by  Intracavitary route as needed for erectile dysfunction. use no more than 3 times per weekINJECT 20 MCG BY INTRACAVITARY  ROUTE AS DIRECTED AS NEEDED FOR  ERECTILE DYSFUNCTION USE NO MORE THAN 3 TIMES WEEKLY   atorvastatin  (LIPITOR) 40 MG tablet, Take 40 mg by mouth at bedtime.    diltiazem  2 % GEL*, Apply 1 Application topically at bedtime.   hydrochlorothiazide  (HYDRODIURIL ) 50 MG tablet, TAKE 1 TABLET BY MOUTH DAILY  Current Outpatient Medications (Respiratory):    cetirizine-pseudoephedrine (ZYRTEC-D) 5-120 MG tablet, Take 1 tablet by mouth daily as needed for allergies.    montelukast  (SINGULAIR ) 10 MG tablet, Take 1 tablet (10 mg total) by mouth daily.  Current Outpatient Medications (Analgesics):    aspirin EC 81 MG tablet, Take 81 mg by mouth daily. Swallow whole.   HYDROcodone -acetaminophen  (NORCO) 7.5-325 MG tablet, Take 1 tablet by mouth every 6 (six) hours as needed for severe pain (pain score 7-10).  Current Outpatient Medications (Other):    AMBULATORY NON FORMULARY MEDICATION, Medication Name: Diltiazem  2%/Lidocaine  2% Using your index finger apply a small amount of medication inside the anal opening and to the external anal area twice daily x 6 weeks.   amoxicillin  (AMOXIL ) 500 MG capsule, Take 2,000 mg by mouth See admin instructions. Dental procedures   calcium  carbonate (TUMS EX) 750 MG chewable tablet, Chew 750 mg by mouth daily as needed for indigestion or heartburn.   Carboxymethylcellulose Sodium (THERATEARS OP), Apply 1 drop to eye as needed (Dry eye).   cyclobenzaprine  (FLEXERIL ) 5 MG tablet, Take 5-10 mg by mouth at bedtime as needed for muscle spasms.   diltiazem  2 % GEL*, Apply 1 Application topically at bedtime.   emtricitabine -rilpivir-tenofovir  AF (ODEFSEY ) 200-25-25 MG TABS tablet, Take 1 tablet by mouth daily.   gabapentin  (NEURONTIN ) 300 MG capsule, Take 300 mg by mouth 3 (three) times daily.   hydrocortisone  (ANUSOL -HC) 25 MG suppository, UNWRAP AND INSERT 1  SUPPOSITORY  RECTALLY TWICE DAILY   lubiprostone  (AMITIZA ) 24 MCG capsule, Take 1 capsule (24 mcg total) by mouth 2 (two) times daily with a meal.   Melatonin 10 MG TABS, Take 10 mg by mouth at bedtime.   methocarbamol  (ROBAXIN ) 500 MG tablet, Take 1 tablet (500 mg total) by mouth every 6 (six) hours as needed for muscle spasms.   Multiple Vitamin (MULTIVITAMIN PO), Take 1 tablet by mouth daily.   polyethylene glycol powder (GLYCOLAX /MIRALAX ) powder, Take 17 g by mouth daily as needed for severe constipation, moderate constipation or mild constipation.   potassium chloride  (KLOR-CON  M) 10 MEQ tablet, Take 1 tablet (10 mEq total) by mouth daily.   ramelteon  (ROZEREM ) 8 MG tablet, Take 1 tablet (8 mg total) by mouth at bedtime.   RESTASIS  0.05 % ophthalmic emulsion, Place 1 drop into both eyes daily.  * These medications belong to multiple therapeutic classes and are listed under each applicable group.  Medical History:  Past Medical History:  Diagnosis Date   Arthritis  BPH (benign prostatic hyperplasia)    [facility]  DR. YSIDRO   Cancer [facility])    Skin cancer- basal and squamous   Closed fracture of nasal bones 06/02/2022   Constipation due to pain medication    HIV disease (HCC) 1985   last CD4 10/02/12 was 277   Hyperlipidemia    Hypertension    Hypogonadism male    IBS (irritable bowel syndrome)    Allergies: Allergies[1]   Surgical History:  He  has a past surgical history that includes neck surgery (1984); Hemorrhoid surgery (1973); Knee arthroscopy (Right, 2009); Knee arthroscopy (Left, 2011); Colonoscopy w/ polypectomy; Eye surgery (Bilateral); Anterior cervical decomp/discectomy fusion (Bilateral, 11/22/2017); Back surgery; Posterior cervical fusion/foraminotomy (N/A, 05/29/2018); Inguinal hernia repair (Right, 06/11/2018); Insertion of mesh (Right, 06/11/2018); Anterior lat lumbar fusion (Left, 02/14/2023); Total knee arthroplasty (Left, 12/25/2023); Anterior lat  lumbar fusion (Right, 03/12/2024); and Mohs surgery (Left, 2025). Family History:  His family history includes Cancer in his father; Colitis in his maternal grandmother; Coronary artery disease in his father; Heart attack in his father; Hyperlipidemia in his father; Hypertension in his father.  REVIEW OF SYSTEMS  : All other systems reviewed and negative except where noted in the History of Present Illness.  PHYSICAL EXAM: Ht 5' 10 (1.778 m)   Wt 141 lb (64 kg)   BMI 20.23 kg/m  Physical Exam   GENERAL APPEARANCE: Well nourished, in no apparent distress. HEENT: No cervical lymphadenopathy, unremarkable thyroid , sclerae anicteric, conjunctiva pink. RESPIRATORY: Respiratory effort normal, breath sounds equal bilaterally without rales, rhonchi, or wheezing. CARDIO: Regular rate and rhythm with no murmurs, rubs, or gallops, peripheral pulses intact. ABDOMEN: Soft, non-distended, active bowel sounds in all four quadrants, no tenderness to palpation, no rebound, no mass appreciated. RECTAL: Decreased rectal tone with soft stool, no rectal masses palpated, no fissure observed, small nodule with white pieces on rectum, tender submucosal nodule on left posterior buttock, referral to surgeon for further evaluation. MUSCULOSKELETAL: Full range of motion, normal gait, without edema. SKIN: Dry, intact without rashes or lesions. No jaundice. NEURO: Alert, oriented, no focal deficits. PSYCH: Cooperative, normal mood and affect.      Alan JONELLE Coombs, PA-C 9:43 AM      [1] No Known Allergies  "

## 2024-08-06 ENCOUNTER — Encounter: Payer: Self-pay | Admitting: Physician Assistant

## 2024-08-06 ENCOUNTER — Ambulatory Visit: Admitting: Physician Assistant

## 2024-08-06 VITALS — Ht 70.0 in | Wt 141.0 lb

## 2024-08-06 DIAGNOSIS — K5904 Chronic idiopathic constipation: Secondary | ICD-10-CM

## 2024-08-06 DIAGNOSIS — K602 Anal fissure, unspecified: Secondary | ICD-10-CM

## 2024-08-06 DIAGNOSIS — K648 Other hemorrhoids: Secondary | ICD-10-CM | POA: Diagnosis not present

## 2024-08-06 DIAGNOSIS — K6289 Other specified diseases of anus and rectum: Secondary | ICD-10-CM

## 2024-08-06 DIAGNOSIS — K5903 Drug induced constipation: Secondary | ICD-10-CM | POA: Diagnosis not present

## 2024-08-06 DIAGNOSIS — B20 Human immunodeficiency virus [HIV] disease: Secondary | ICD-10-CM | POA: Diagnosis not present

## 2024-08-06 DIAGNOSIS — T402X5A Adverse effect of other opioids, initial encounter: Secondary | ICD-10-CM

## 2024-08-06 DIAGNOSIS — M9905 Segmental and somatic dysfunction of pelvic region: Secondary | ICD-10-CM | POA: Diagnosis not present

## 2024-08-06 DIAGNOSIS — Z21 Asymptomatic human immunodeficiency virus [HIV] infection status: Secondary | ICD-10-CM

## 2024-08-06 MED ORDER — AMBULATORY NON FORMULARY MEDICATION: 1 refills | Status: AC

## 2024-08-06 MED ORDER — LUBIPROSTONE 24 MCG PO CAPS: 24.0000 ug | ORAL_CAPSULE | Freq: Two times a day (BID) | ORAL | 0 refills | Status: AC

## 2024-08-06 NOTE — Patient Instructions (Addendum)
 Anal Fissure, Adult  A fissure is a linear defect in the anal mucosa, symptoms include burning, itching, discomfort especially with a bowel movement with associated rectal bleeding.  Risk factors include low fiber diet, chronic constipation and straining. Anal fissures can take a very long time to heal so this will be a 2 to 60-month process.  Treatment for a fissure includes:  -decreasing time in the toilet should not be more than 5 minutes -adding fiber supplement such as Benefiber or Citrucel -increasing water . -I am also going to send in a calcium  channel blocker cream to a compound pharmacy, apply twice daily for 12 weeks. If after 3 months this is not helpful then we will we will refer you to general surgery for evaluation, they can do Botox injections under anesthesia or surgery.  Diltiazem /lidocaine  3 x daily for 2 months sent to compound pharmacy   Sent this medication to a compound pharmacy:  [facility] [address] [place], [place], [place] [address]  [phone]   Please DO NOT go directly from our office to pick up this medication! Give the pharmacy 1 day to process the prescription. Extra time is required for them to compound your medication.   Toileting tips to help with your constipation - Drink at least 64-80 ounces of water /liquid per day. - Establish a time to try to move your bowels every day.  For many people, this is after a cup of coffee or after a meal such as breakfast. - Sit all of the way back on the toilet keeping your back fairly straight and while sitting up, try to rest the tops of your forearms on your upper thighs.   - Raising your feet with a step stool/squatty potty can be helpful to improve the angle that allows your stool to pass through the rectum. - Relax the rectum feeling it bulge toward the toilet water .  If you feel your rectum raising toward your body, you are contracting rather than relaxing. - Breathe in and slowly exhale. Belly  breath by expanding your belly towards your belly button. Keep belly expanded as you gently direct pressure down and back to the anus.  A low pitched GRRR sound can assist with increasing intra-abdominal pressure.  (Can also trying to blow on a pinwheel and make it move, this helps with the same belly breathing) - Repeat 3-4 times. If unsuccessful, contract the pelvic floor to restore normal tone and get off the toilet.  Avoid excessive straining. - To reduce excessive wiping by teaching your anus to normally contract, place hands on outer aspect of knees and resist knee movement outward.  Hold 5-10 second then place hands just inside of knees and resist inward movement of knees.  Hold 5 seconds.  Repeat a few times each way.  Go to the ER if unable to pass gas, severe AB pain, unable to hold down food, any shortness of breath of chest pain.  [facility] will be in contact with you about a consult appointment    [address], [place], [place] [address] Phone: [phone]  I appreciate the  opportunity to care for you  Thank You   Amanda Collier,PA-C

## 2024-08-08 ENCOUNTER — Telehealth: Payer: Self-pay | Admitting: *Deleted

## 2024-08-08 NOTE — Telephone Encounter (Signed)
 Records faxed today to [facility] for AIN  Rectal Dysplasia

## 2024-08-13 ENCOUNTER — Other Ambulatory Visit: Payer: Self-pay | Admitting: Internal Medicine

## 2024-09-03 ENCOUNTER — Ambulatory Visit: Admitting: Internal Medicine

## 2024-09-08 ENCOUNTER — Ambulatory Visit: Admitting: Radiation Oncology

## 2024-10-03 ENCOUNTER — Ambulatory Visit: Admitting: Gastroenterology

## 2025-07-29 ENCOUNTER — Ambulatory Visit
